# Patient Record
Sex: Male | Born: 1935 | Race: White | Hispanic: No | Marital: Married | Smoking: Never smoker
Health system: Southern US, Community
[De-identification: ages and names within clinical notes are randomized; demographics above are authoritative.]

## PROBLEM LIST (undated history)

## (undated) DIAGNOSIS — K2970 Gastritis, unspecified, without bleeding: Secondary | ICD-10-CM

## (undated) DIAGNOSIS — I471 Supraventricular tachycardia: Secondary | ICD-10-CM

## (undated) DIAGNOSIS — K2990 Gastroduodenitis, unspecified, without bleeding: Secondary | ICD-10-CM

## (undated) DIAGNOSIS — C259 Malignant neoplasm of pancreas, unspecified: Secondary | ICD-10-CM

## (undated) DIAGNOSIS — D126 Benign neoplasm of colon, unspecified: Secondary | ICD-10-CM

## (undated) DIAGNOSIS — B9681 Helicobacter pylori [H. pylori] as the cause of diseases classified elsewhere: Secondary | ICD-10-CM

## (undated) DIAGNOSIS — K2270 Barrett's esophagus without dysplasia: Secondary | ICD-10-CM

## (undated) DIAGNOSIS — C257 Malignant neoplasm of other parts of pancreas: Secondary | ICD-10-CM

## (undated) DIAGNOSIS — K219 Gastro-esophageal reflux disease without esophagitis: Secondary | ICD-10-CM

## (undated) DIAGNOSIS — I10 Essential (primary) hypertension: Secondary | ICD-10-CM

## (undated) HISTORY — DX: Helicobacter pylori (H. pylori) as the cause of diseases classified elsewhere: B96.81

## (undated) HISTORY — PX: NASAL SEPTUM SURGERY: SHX37

## (undated) HISTORY — PX: BLADDER SURGERY: SHX569

## (undated) HISTORY — DX: Gastroduodenitis, unspecified, without bleeding: K29.90

## (undated) HISTORY — PX: HEMORRHOID SURGERY: SHX153

## (undated) HISTORY — DX: Essential (primary) hypertension: I10

## (undated) HISTORY — PX: OTHER SURGICAL HISTORY: SHX169

## (undated) HISTORY — DX: Malignant neoplasm of other parts of pancreas: C25.7

## (undated) HISTORY — PX: NECK SURGERY: SHX720

## (undated) HISTORY — DX: Gastritis, unspecified, without bleeding: K29.70

## (undated) HISTORY — DX: Benign neoplasm of colon, unspecified: D12.6

## (undated) HISTORY — PX: TONSILLECTOMY: SUR1361

## (undated) HISTORY — DX: Barrett's esophagus without dysplasia: K22.70

---

## 1997-10-14 ENCOUNTER — Ambulatory Visit (HOSPITAL_COMMUNITY): Admission: RE | Admit: 1997-10-14 | Discharge: 1997-10-14 | Payer: Self-pay | Admitting: Neurological Surgery

## 1997-10-21 ENCOUNTER — Encounter: Payer: Self-pay | Admitting: Neurological Surgery

## 1997-10-21 ENCOUNTER — Ambulatory Visit (HOSPITAL_COMMUNITY): Admission: RE | Admit: 1997-10-21 | Discharge: 1997-10-21 | Payer: Self-pay | Admitting: Neurological Surgery

## 1998-01-21 DIAGNOSIS — B9681 Helicobacter pylori [H. pylori] as the cause of diseases classified elsewhere: Secondary | ICD-10-CM

## 1998-01-21 HISTORY — PX: SINUS EXPLORATION: SHX5214

## 1998-01-21 HISTORY — DX: Helicobacter pylori (H. pylori) as the cause of diseases classified elsewhere: B96.81

## 2000-01-18 ENCOUNTER — Encounter: Admission: RE | Admit: 2000-01-18 | Discharge: 2000-01-18 | Payer: Self-pay | Admitting: Family Medicine

## 2000-06-24 ENCOUNTER — Ambulatory Visit (HOSPITAL_COMMUNITY): Admission: RE | Admit: 2000-06-24 | Discharge: 2000-06-24 | Payer: Self-pay | Admitting: Internal Medicine

## 2001-05-21 ENCOUNTER — Ambulatory Visit (HOSPITAL_COMMUNITY): Admission: RE | Admit: 2001-05-21 | Discharge: 2001-05-21 | Payer: Self-pay | Admitting: Family Medicine

## 2001-05-21 ENCOUNTER — Encounter: Payer: Self-pay | Admitting: Family Medicine

## 2001-06-29 ENCOUNTER — Encounter: Payer: Self-pay | Admitting: Family Medicine

## 2001-06-29 ENCOUNTER — Ambulatory Visit (HOSPITAL_COMMUNITY): Admission: RE | Admit: 2001-06-29 | Discharge: 2001-06-29 | Payer: Self-pay | Admitting: Family Medicine

## 2002-10-06 ENCOUNTER — Ambulatory Visit (HOSPITAL_COMMUNITY): Admission: RE | Admit: 2002-10-06 | Discharge: 2002-10-06 | Payer: Self-pay | Admitting: Family Medicine

## 2002-10-06 ENCOUNTER — Encounter: Payer: Self-pay | Admitting: Family Medicine

## 2002-11-05 ENCOUNTER — Ambulatory Visit (HOSPITAL_COMMUNITY): Admission: RE | Admit: 2002-11-05 | Discharge: 2002-11-05 | Payer: Self-pay | Admitting: Internal Medicine

## 2004-01-31 ENCOUNTER — Ambulatory Visit (HOSPITAL_COMMUNITY): Admission: RE | Admit: 2004-01-31 | Discharge: 2004-01-31 | Payer: Self-pay | Admitting: Family Medicine

## 2004-05-16 ENCOUNTER — Ambulatory Visit (HOSPITAL_COMMUNITY): Admission: RE | Admit: 2004-05-16 | Discharge: 2004-05-16 | Payer: Self-pay | Admitting: Family Medicine

## 2004-12-18 ENCOUNTER — Ambulatory Visit: Payer: Self-pay | Admitting: Internal Medicine

## 2004-12-20 ENCOUNTER — Ambulatory Visit (HOSPITAL_COMMUNITY): Admission: RE | Admit: 2004-12-20 | Discharge: 2004-12-20 | Payer: Self-pay | Admitting: Internal Medicine

## 2006-01-21 HISTORY — PX: ESOPHAGOGASTRODUODENOSCOPY: SHX1529

## 2006-07-16 ENCOUNTER — Ambulatory Visit: Payer: Self-pay | Admitting: Gastroenterology

## 2006-07-24 ENCOUNTER — Ambulatory Visit: Payer: Self-pay | Admitting: Gastroenterology

## 2006-07-24 ENCOUNTER — Encounter: Payer: Self-pay | Admitting: Gastroenterology

## 2006-07-24 ENCOUNTER — Ambulatory Visit (HOSPITAL_COMMUNITY): Admission: RE | Admit: 2006-07-24 | Discharge: 2006-07-24 | Payer: Self-pay | Admitting: Gastroenterology

## 2006-08-14 ENCOUNTER — Ambulatory Visit: Payer: Self-pay | Admitting: Gastroenterology

## 2007-01-22 HISTORY — PX: DOPPLER ECHOCARDIOGRAPHY: SHX263

## 2007-01-22 HISTORY — PX: NM MYOVIEW LTD: HXRAD82

## 2007-03-09 ENCOUNTER — Ambulatory Visit (HOSPITAL_COMMUNITY): Admission: RE | Admit: 2007-03-09 | Discharge: 2007-03-09 | Payer: Self-pay | Admitting: Family Medicine

## 2008-01-22 DIAGNOSIS — D126 Benign neoplasm of colon, unspecified: Secondary | ICD-10-CM

## 2008-01-22 HISTORY — PX: COLONOSCOPY: SHX174

## 2008-01-22 HISTORY — DX: Benign neoplasm of colon, unspecified: D12.6

## 2008-05-16 ENCOUNTER — Ambulatory Visit (HOSPITAL_COMMUNITY): Admission: RE | Admit: 2008-05-16 | Discharge: 2008-05-16 | Payer: Self-pay | Admitting: Anesthesiology

## 2008-09-23 DIAGNOSIS — M109 Gout, unspecified: Secondary | ICD-10-CM

## 2008-09-23 DIAGNOSIS — K30 Functional dyspepsia: Secondary | ICD-10-CM

## 2008-09-23 DIAGNOSIS — J329 Chronic sinusitis, unspecified: Secondary | ICD-10-CM | POA: Insufficient documentation

## 2008-09-23 DIAGNOSIS — I10 Essential (primary) hypertension: Secondary | ICD-10-CM | POA: Insufficient documentation

## 2008-09-23 DIAGNOSIS — K2990 Gastroduodenitis, unspecified, without bleeding: Secondary | ICD-10-CM

## 2008-09-23 DIAGNOSIS — K2980 Duodenitis without bleeding: Secondary | ICD-10-CM | POA: Insufficient documentation

## 2008-09-23 DIAGNOSIS — M129 Arthropathy, unspecified: Secondary | ICD-10-CM | POA: Insufficient documentation

## 2008-09-23 DIAGNOSIS — K2970 Gastritis, unspecified, without bleeding: Secondary | ICD-10-CM | POA: Insufficient documentation

## 2008-09-23 DIAGNOSIS — K219 Gastro-esophageal reflux disease without esophagitis: Secondary | ICD-10-CM

## 2008-09-23 HISTORY — DX: Gastroduodenitis, unspecified, without bleeding: K29.90

## 2008-09-23 HISTORY — DX: Gastritis, unspecified, without bleeding: K29.70

## 2008-09-28 ENCOUNTER — Ambulatory Visit: Payer: Self-pay | Admitting: Gastroenterology

## 2008-09-28 DIAGNOSIS — R1013 Epigastric pain: Secondary | ICD-10-CM

## 2008-09-29 ENCOUNTER — Encounter: Payer: Self-pay | Admitting: Gastroenterology

## 2008-09-29 DIAGNOSIS — F1010 Alcohol abuse, uncomplicated: Secondary | ICD-10-CM | POA: Insufficient documentation

## 2008-10-03 ENCOUNTER — Ambulatory Visit (HOSPITAL_COMMUNITY): Admission: RE | Admit: 2008-10-03 | Discharge: 2008-10-03 | Payer: Self-pay | Admitting: Gastroenterology

## 2008-10-03 LAB — CONVERTED CEMR LAB
ALT: 26 units/L (ref 0–53)
AST: 25 units/L (ref 0–37)
Albumin: 4.3 g/dL (ref 3.5–5.2)
Alkaline Phosphatase: 60 units/L (ref 39–117)
Calcium: 9.2 mg/dL (ref 8.4–10.5)
Chloride: 105 meq/L (ref 96–112)
Potassium: 4.3 meq/L (ref 3.5–5.3)
Sodium: 142 meq/L (ref 135–145)
Total Protein: 7.2 g/dL (ref 6.0–8.3)

## 2008-10-05 ENCOUNTER — Encounter: Payer: Self-pay | Admitting: Gastroenterology

## 2008-10-05 ENCOUNTER — Ambulatory Visit: Payer: Self-pay | Admitting: Gastroenterology

## 2008-10-05 ENCOUNTER — Ambulatory Visit (HOSPITAL_COMMUNITY): Admission: RE | Admit: 2008-10-05 | Discharge: 2008-10-05 | Payer: Self-pay | Admitting: Gastroenterology

## 2008-10-10 ENCOUNTER — Telehealth (INDEPENDENT_AMBULATORY_CARE_PROVIDER_SITE_OTHER): Payer: Self-pay | Admitting: *Deleted

## 2008-12-14 ENCOUNTER — Ambulatory Visit: Payer: Self-pay | Admitting: Gastroenterology

## 2009-05-24 ENCOUNTER — Encounter (INDEPENDENT_AMBULATORY_CARE_PROVIDER_SITE_OTHER): Payer: Self-pay | Admitting: *Deleted

## 2009-11-01 ENCOUNTER — Ambulatory Visit (HOSPITAL_COMMUNITY)
Admission: RE | Admit: 2009-11-01 | Discharge: 2009-11-01 | Payer: Self-pay | Source: Home / Self Care | Admitting: Internal Medicine

## 2009-12-21 HISTORY — PX: ESOPHAGOGASTRODUODENOSCOPY: SHX1529

## 2009-12-27 ENCOUNTER — Encounter
Admission: RE | Admit: 2009-12-27 | Discharge: 2009-12-27 | Payer: Self-pay | Source: Home / Self Care | Admitting: Neurological Surgery

## 2010-01-03 ENCOUNTER — Ambulatory Visit: Payer: Self-pay | Admitting: Gastroenterology

## 2010-01-04 ENCOUNTER — Encounter: Payer: Self-pay | Admitting: Internal Medicine

## 2010-01-09 ENCOUNTER — Ambulatory Visit (HOSPITAL_COMMUNITY)
Admission: RE | Admit: 2010-01-09 | Discharge: 2010-01-09 | Payer: Self-pay | Source: Home / Self Care | Attending: Internal Medicine | Admitting: Internal Medicine

## 2010-01-12 ENCOUNTER — Ambulatory Visit (HOSPITAL_COMMUNITY)
Admission: RE | Admit: 2010-01-12 | Discharge: 2010-01-12 | Payer: Self-pay | Source: Home / Self Care | Attending: Internal Medicine | Admitting: Internal Medicine

## 2010-01-16 ENCOUNTER — Encounter: Payer: Self-pay | Admitting: Internal Medicine

## 2010-01-17 ENCOUNTER — Encounter (HOSPITAL_COMMUNITY)
Admission: RE | Admit: 2010-01-17 | Discharge: 2010-02-16 | Payer: Self-pay | Source: Home / Self Care | Attending: Internal Medicine | Admitting: Internal Medicine

## 2010-01-18 ENCOUNTER — Inpatient Hospital Stay (HOSPITAL_COMMUNITY)
Admission: RE | Admit: 2010-01-18 | Discharge: 2010-01-19 | Payer: Self-pay | Source: Home / Self Care | Attending: Neurological Surgery | Admitting: Neurological Surgery

## 2010-02-20 NOTE — Letter (Signed)
Summary: Recall Office Visit  [facility]  [address]   [place], [place] [address]   Phone: [phone]  Fax: [phone]      May 24, 2009   William Rivera [address] [place], [place]  [address] 1935-05-08   Dear [name],   According to our records, it is time for you to schedule a follow-up office visit with [place].   At your convenience, please call [phone] to schedule an office visit. If you have any questions, concerns, or feel that this letter is in error, we would appreciate your call.   Sincerely,    Diana Eves  [facility] Ph: [phone]   Fax: [phone]

## 2010-02-22 NOTE — Letter (Signed)
Summary: HIDA SCAN ORDER  HIDA SCAN ORDER   Imported By: Ave Filter 01/16/2010 12:06:34  _____________________________________________________________________  External Attachment:    Type:   Image     Comment:   External Document

## 2010-02-22 NOTE — Letter (Signed)
Summary: EGD ORDER  EGD ORDER   Imported By: Ave Filter 01/04/2010 10:00:41  _____________________________________________________________________  External Attachment:    Type:   Image     Comment:   External Document

## 2010-02-22 NOTE — Assessment & Plan Note (Signed)
Summary: ABD PAIN & NAUSEA/LAW   Visit Type:  Follow-up Visit Primary Care Provider:  Phillips Odor  CC:  abd pain/burning/nausea.  History of Present Illness: Pt presents today with c/o epigastric pain that wakens him up at 3-4am in the morning. X 3-4 mos worsened. Has abstained from alcohol for 4-5 months. Rare pain during day, mostly at night. "sick" feeling  at night with pain. Denies dysphagia or odynophagia. no loss of appetite. weight stable. occasional ASA, tries to limit NSAID use. no BC or Goodys. Father passed away from stomach ca at 73 years old, pt concerned. Hx of Aciphex in past, too expensive, took Prevacid. Prevacid seemed same as Zantac, so just took Zantac. On Zantac for past few years. Denies any melena, hematochezia. BM daily. No constipation. Zantac not relieving like it used to. Last saw Dr. Darrick Penna in 2010, recommendations to proceed with endoscopy if worsening pain.   Current Medications (verified): 1)  Allopurinol 100 Mg Tabs (Allopurinol) .... Take 1 Tablet By Mouth Once A Day 2)  Atenolol 25 Mg Tabs (Atenolol) .... Take 1 Tablet By Mouth Once A Day 3)  Zantac 150 Mg Tabs (Ranitidine Hcl) .... Take 1 Tablet By Mouth Once A Day  Allergies (verified): 1)  ! Lucy Antigua  Family History: Father: gastric cancer age 30 Mother: non-contributory No FH of Colon Cancer:  Social History: Retired: truck Hospital doctor Married X 48 years 1 children Patient is a former smoker. age 75.  Alcohol Use - no: stopped 4-5 months ago Smoking Status:  quit  Review of Systems General:  Denies fever, chills, and anorexia. Eyes:  Denies blurring, irritation, and discharge. ENT:  Denies sore throat, hoarseness, and difficulty swallowing. CV:  Denies chest pains and syncope. Resp:  Denies dyspnea at rest and wheezing. GI:  Complains of abdominal pain; denies difficulty swallowing, gas/bloating, constipation, change in bowel habits, bloody BM's, and black BMs. GU:  Denies urinary burning and urinary  frequency. MS:  Denies joint pain / LOM, joint swelling, and joint stiffness. Derm:  Denies rash, itching, and dry skin. Neuro:  Denies weakness and syncope. Psych:  Denies depression and anxiety. Endo:  Denies cold intolerance and heat intolerance. Heme:  Denies bruising and bleeding.  Vital Signs:  Patient profile:   75 year old male Height:      69 inches Weight:      223 pounds BMI:     33.05 Temp:     97.9 degrees F oral Pulse rate:   72 / minute BP sitting:   130 / 84  (left arm) Cuff size:   regular  Vitals Entered By: Cloria Spring LPN (January 03, 2010 2:11 PM)  Physical Exam  General:  Well developed, well nourished, no acute distress. Head:  Normocephalic and atraumatic. Eyes:  sclera non-icteric Lungs:  Clear throughout to auscultation. Heart:  Regular rate and rhythm; no murmurs, rubs,  or bruits. Abdomen:  normal bowel sounds, obese, without guarding, and without rebound.  +BS, mild epigastric tenderness Msk:  Symmetrical with no gross deformities. Normal posture. Pulses:  Normal pulses noted. Extremities:  No clubbing, cyanosis, edema or deformities noted. Neurologic:  Alert and  oriented x4;  grossly normal neurologically.  Impression & Recommendations:  Problem # 1:  ABDOMINAL PAIN, EPIGASTRIC (ICD-96.53)  75 year old Caucasian male with hx of duodenitis and gastritis secondary to NSAIDs. last EGD 2008 by Dr Darrick Penna: multiple antral erosions, multiple areas of exudate in duodenum with erosions. Hx of alcohol use. Has abstained X 4-5 mos. Numerous  PPIs in past including prilosec, aciphex, zantac, prevacid. Currently on Zantac, +epigastric pain worsening over 4-5 months, specifically at night. + nausea. with hx, likely gastritis vs PUD, father has hx of gastric ca, possibility of malignancy a concern.  Dexilant trial, #15 samples. Rx called into pharmacy EGD with Dr. Jena Gauss (in Dr. Evelina Dun absence): risks, benefits, alternatives discussed with pt. States  understanding and has given verbal consent.   Orders: Est. Patient Level II ([address]) Prescriptions: DEXILANT 60 MG CPDR (DEXLANSOPRAZOLE) take 1 by mouth daily  #30 x 3   Entered and Authorized by:   Gerrit Halls NP   Signed by:   Gerrit Halls NP on 01/03/2010   Method used:   Faxed to ...       [address] [phone]* (retail)       [address]       [place], [place]  [address]       Ph: [phone]       Fax: [phone]   RxID:   [id number]

## 2010-02-26 ENCOUNTER — Ambulatory Visit (INDEPENDENT_AMBULATORY_CARE_PROVIDER_SITE_OTHER): Payer: Medicare Other | Admitting: Urgent Care

## 2010-02-26 ENCOUNTER — Encounter: Payer: Self-pay | Admitting: Urgent Care

## 2010-02-26 DIAGNOSIS — K2270 Barrett's esophagus without dysplasia: Secondary | ICD-10-CM | POA: Insufficient documentation

## 2010-02-26 DIAGNOSIS — K219 Gastro-esophageal reflux disease without esophagitis: Secondary | ICD-10-CM

## 2010-03-05 ENCOUNTER — Other Ambulatory Visit (HOSPITAL_COMMUNITY): Payer: Self-pay | Admitting: Internal Medicine

## 2010-03-05 ENCOUNTER — Ambulatory Visit (HOSPITAL_COMMUNITY): Payer: PRIVATE HEALTH INSURANCE

## 2010-03-05 ENCOUNTER — Ambulatory Visit (HOSPITAL_COMMUNITY)
Admission: RE | Admit: 2010-03-05 | Discharge: 2010-03-05 | Disposition: A | Discharge status: Home / Self Care | Payer: Medicare Other | Source: Ambulatory Visit | Attending: Internal Medicine | Admitting: Internal Medicine

## 2010-03-05 ENCOUNTER — Encounter (HOSPITAL_COMMUNITY): Payer: Self-pay

## 2010-03-05 DIAGNOSIS — M545 Low back pain, unspecified: Secondary | ICD-10-CM | POA: Insufficient documentation

## 2010-03-05 DIAGNOSIS — M549 Dorsalgia, unspecified: Secondary | ICD-10-CM

## 2010-03-05 DIAGNOSIS — M47817 Spondylosis without myelopathy or radiculopathy, lumbosacral region: Secondary | ICD-10-CM | POA: Insufficient documentation

## 2010-03-08 NOTE — Assessment & Plan Note (Signed)
Summary: OV W/US 4-6 WEEKS PER RMR,SLUDGE IN GB   Vital Signs:  Patient profile:   75 year old male Height:      69 inches Weight:      224 pounds BMI:     33.20 Temp:     97.8 degrees F oral Pulse rate:   76 / minute BP sitting:   115 / 78  (left arm) Cuff size:   regular  Vitals Entered By: Hendricks Limes LPN (February 26, 2010 3:00 PM)  Visit Type:  Follow-up Visit Primary Care Provider:  Dr. Phillips Odor  Chief Complaint:  follow up.  History of Present Illness: 75 y/o caucasian  male here for FU GERD & abd pain.  Denies abd pain, N/V/D.  Denies heartburn & indigestion.  Denies dysphagia or odynophagia.  Wt stable.  Denies anorexia.  c/o low back pain from fall and will be seeing PCP today for this.  Denies GU symptoms.  Recent ABD US-> GB sludge, recent HIDA normal.  Has been taking Zantac due to the price of Dexilant.   Current Medications (verified): 1)  Allopurinol 100 Mg Tabs (Allopurinol) .... Take 1 Tablet By Mouth Once A Day 2)  Atenolol 25 Mg Tabs (Atenolol) .... Take 1 Tablet By Mouth Once A Day 3)  Zantac 150 Mg Tabs (Ranitidine Hcl) .... Take 1 Tablet By Mouth Once A Day 4)  Ramipril 2.5 Mg Caps (Ramipril) .... 3 Times A Week  Allergies (verified): 1)  ! Lucy Antigua  Past History:  Past Surgical History: Last updated: 09/23/2008 Neck surgery (disc replaced) Bladder 1978, 1979 Sinus (2000) Tonsillectomy ( as a child)  Past Medical History: 01/09/10 EGD->short segment Barrett's, Small hiatal hernia, chronic gastritis 2008 EGD: Duodenitis and gastritis 2o to NSAIDs/EtOH CRI, Cr 1.5, followed by Dr. Fausto Skillern Hypertension GOUT 2010 TCS: Simple adenomas  Review of Systems General:  Denies fever, chills, sweats, anorexia, fatigue, weakness, malaise, weight loss, and sleep disorder. CV:  Denies chest pains, angina, palpitations, syncope, dyspnea on exertion, orthopnea, PND, peripheral edema, and claudication. Resp:  Denies dyspnea at rest, dyspnea with exercise,  cough, sputum, wheezing, coughing up blood, and pleurisy. GI:  See HPI; Denies difficulty swallowing, pain on swallowing, jaundice, bloody BM's, black BMs, and fecal incontinence. GU:  Denies urinary burning, blood in urine, urinary frequency, urinary hesitancy, nocturnal urination, and urinary incontinence. MS:  Denies joint pain / LOM, joint swelling, joint stiffness, joint deformity, low back pain, muscle weakness, muscle cramps, muscle atrophy, leg pain at night, leg pain with exertion, and shoulder pain / LOM hand / wrist pain (CTS). Derm:  Denies rash, itching, dry skin, hives, moles, warts, and unhealing ulcers. Psych:  Denies depression, anxiety, memory loss, suicidal ideation, hallucinations, paranoia, phobia, and confusion. Heme:  Denies bruising, bleeding, and enlarged lymph nodes.  Physical Exam  General:  Well developed, well nourished, no acute distress. Head:  Normocephalic and atraumatic. Eyes:  sclera non-icteric Mouth:  No deformity or lesions. Neck:  Supple; no masses. Heart:  Regular rate and rhythm; no murmurs, rubs,  or bruits. Abdomen:  Soft, nontender and nondistended. No masses, hepatosplenomegaly or hernias noted. Normal bowel sounds.without guarding and without rebound.   Msk:  Symmetrical with no gross deformities. Normal posture. Pulses:  Normal pulses noted. Extremities:  No clubbing, cyanosis, edema or deformities noted. Neurologic:  Alert and  oriented x4;  grossly normal neurologically. Skin:  Intact without significant lesions or rashes. Cervical Nodes:  No significant cervical adenopathy. Psych:  Alert and cooperative.  Normal mood and affect.   Impression & Recommendations:  Problem # 1:  GERD (ICD-530.81) Well controlled on Zantac, however urged him to resume once daily PPI.  Dexilant rebate card should reduce his copay.  If not, will consider another PPI given hx Barrett's.  Orders: Est. Patient Level III ([address])  Problem # 2:  BARRETTS ESOPHAGUS  (ICD-530.85) See #1  Patient Instructions: 1)  Colonscopy 09/2018 2)  Office visit to set up EGD in 12/2010 surveillance Barrett's 3)  Dexilant 60mg  daily 4)  Stop zantac   Orders Added: 1)  Est. Patient Level III [[address]]  Appended Document: OV W/US 4-6 WEEKS PER RMR,SLUDGE IN GB reminders in epic

## 2010-03-29 ENCOUNTER — Encounter (INDEPENDENT_AMBULATORY_CARE_PROVIDER_SITE_OTHER): Payer: Self-pay | Admitting: *Deleted

## 2010-04-02 LAB — CBC
MCH: 34.1 pg — ABNORMAL HIGH (ref 26.0–34.0)
MCHC: 35.6 g/dL (ref 30.0–36.0)
MCV: 95.9 fL (ref 78.0–100.0)
Platelets: 188 10*3/uL (ref 150–400)
RBC: 4.37 MIL/uL (ref 4.22–5.81)
RDW: 12.6 % (ref 11.5–15.5)

## 2010-04-02 LAB — BASIC METABOLIC PANEL
BUN: 22 mg/dL (ref 6–23)
CO2: 28 mEq/L (ref 19–32)
Calcium: 9.4 mg/dL (ref 8.4–10.5)
Chloride: 106 mEq/L (ref 96–112)
Creatinine, Ser: 1.43 mg/dL (ref 0.4–1.5)
Glucose, Bld: 109 mg/dL — ABNORMAL HIGH (ref 70–99)

## 2010-04-03 NOTE — Letter (Signed)
Summary: Recall Office Visit  [facility]  [address]   [place], [place] [address]   Phone: [phone]  Fax: [phone]      March 29, 2010   William Rivera [address] [place], [place]  [address] 1935-12-11   Dear William Rivera,   According to our records, it is time for you to schedule a follow-up office visit with [place].   At your convenience, please call [phone] to schedule an office visit. If you have any questions, concerns, or feel that this letter is in error, we would appreciate your call.   Sincerely,    Diana Eves  [facility] Ph: [phone]   Fax: [phone]

## 2010-04-05 ENCOUNTER — Encounter: Payer: Self-pay | Admitting: Gastroenterology

## 2010-04-16 ENCOUNTER — Encounter: Payer: Self-pay | Admitting: Gastroenterology

## 2010-04-16 ENCOUNTER — Ambulatory Visit (INDEPENDENT_AMBULATORY_CARE_PROVIDER_SITE_OTHER): Payer: Medicare Other | Admitting: Gastroenterology

## 2010-04-16 VITALS — BP 142/90 | HR 76 | Temp 97.6°F | Ht 69.0 in | Wt 215.0 lb

## 2010-04-16 DIAGNOSIS — K2270 Barrett's esophagus without dysplasia: Secondary | ICD-10-CM

## 2010-04-16 DIAGNOSIS — R634 Abnormal weight loss: Secondary | ICD-10-CM

## 2010-04-16 DIAGNOSIS — K8020 Calculus of gallbladder without cholecystitis without obstruction: Secondary | ICD-10-CM

## 2010-04-16 DIAGNOSIS — R1013 Epigastric pain: Secondary | ICD-10-CM

## 2010-04-16 DIAGNOSIS — K828 Other specified diseases of gallbladder: Secondary | ICD-10-CM

## 2010-04-16 MED ORDER — ESOMEPRAZOLE MAGNESIUM 40 MG PO CPDR: 40.0000 mg | DELAYED_RELEASE_CAPSULE | Freq: Every day | ORAL | Status: DC

## 2010-04-16 NOTE — Assessment & Plan Note (Signed)
Diagnosed at time of the EGD in December of 2011. He is due for surveillance EGD in December of 2012. He should stay on PPI. Usually he takes Prevacid 24 hour as other prescription PPIs are too expensive. We'll give him samples of Nexium 40 mg daily and he can take Prevacid 24-hour when his samples run out.

## 2010-04-16 NOTE — Assessment & Plan Note (Signed)
Much improved. Would advise continued alcohol cessation. Get back on PPI. Nexium samples provided to supplement his supply. He generally takes Prevacid 24 hour due to the expense of other prescription PPIs. He will call with any persistent symptoms. Otherwise we'll see him back in 3 months, with a scheduled visit with Dr. Jonette Eva.

## 2010-04-16 NOTE — Patient Instructions (Signed)
Please take either Nexium or Prevacid OTC once daily instead of Zantac. If stomach burning/pain persists, please call us. If you continue to loose weight (ie 5 more pounds), please call us. Office visit with Dr. Jonette Eva in three months.

## 2010-04-16 NOTE — Progress Notes (Signed)
William Rivera is here for followup visit. He was last seen in 02/2010. Overall doing well. No dysphagia. Some occasional heartburn. Epigastric burning does better on Prevacid but recently ran out. No melena, brbpr. No n/v. Appetite good. Weight down 10 pounds since 2/12, unintentional. Has had some back pain, epidural injections 3 weeks ago. Some right flank pain this weekend. No dysuria, hematuria.  He states that he has quit drinking all alcohol about a month ago to see if it would help with his abdominal pain and burning. He believes it has helped.  Current Outpatient Prescriptions  Medication Sig Dispense Refill  . allopurinol (ZYLOPRIM) 100 MG tablet Take 100 mg by mouth daily.        Marland Kitchen atenolol (TENORMIN) 25 MG tablet Take 25 mg by mouth daily.        . ramipril (ALTACE) 2.5 MG tablet Take 2.5 mg by mouth daily. 3 TIMES A WEEK       . ranitidine (ZANTAC) 150 MG tablet Take 150 mg by mouth 1 dose over 46 hours.        Marland Kitchen esomeprazole (NEXIUM) 40 MG capsule Take 1 capsule (40 mg total) by mouth daily.  15 capsule  0    No Known Allergies  ROS:   General: Negative for anorexia, fever, chills, fatigue, weakness. Positive for weight loss.  ENT: Negative for hoarseness, difficulty swallowing , nasal congestion.  CV: Negative for chest pain, angina, palpitations, dyspnea on exertion, peripheral edema.   Respiratory: Negative for dyspnea at rest, dyspnea on exertion, cough, sputum, wheezing.   GI: See history of present illness.  GU:  Negative for dysuria, hematuria, urinary incontinence, urinary frequency, nocturnal urination.   Endo: Positive for unusual weight change.   Physical Examination:   General: Well-nourished, well-developed in no acute distress.   Eyes: No icterus.  Mouth: Oropharyngeal mucosa moist and pink , no lesions erythema or exudate.  Lungs: Clear to auscultation bilaterally.   Heart: Regular rate and rhythm, no murmurs rubs or gallops.   Abdomen: Mild epigastric tenderness.  Bowel sounds are normal, nondistended, no hepatosplenomegaly or masses, no abdominal bruits or hernia , no rebound or guarding.    Extremities: No lower extremity edema.   Neuro: Alert and oriented x 4    Skin: Warm and dry, no jaundice.    Psych: Alert and cooperative, normal mood and affect.

## 2010-04-16 NOTE — Progress Notes (Signed)
Reviewed by R. Michael Rourk, MD FACP FACG 

## 2010-04-16 NOTE — Assessment & Plan Note (Signed)
9 pound weight loss since his last office visit here in February. This is unintentional. He states his appetite is good. Will have him monitor his weight at home and if it drops more than 5 more pounds he should call us and at that point would consider further workup. He is in agreement with this plan.

## 2010-04-16 NOTE — Assessment & Plan Note (Signed)
Gallbladder sludge seen on recent abdominal ultrasound. HIDA scan unremarkable. Overall patient is fairly asymptomatic. Suspect is epigastric pain may be more related to gastritis and reflux. His symptoms have settled down since he has cut out alcohol consumption. Will continue to monitor for now.

## 2010-06-05 NOTE — Assessment & Plan Note (Signed)
NAME:  William Rivera, William Rivera                   CHART#:  [id number]   DATE:  08/14/2006                       DOB:  1935-10-03   REFERRING PHYSICIAN:  Patrica Duel.   PROBLEM LIST:  1. Duodenitis and gastritis secondary to non-steroidal anti-      inflammatory drugs.  2. Gout.  3. Hypertension.   SUBJECTIVE:  The patient is a 75 year old male who presents as a return  patient visit.  His nausea is gone and he has very little abdominal  pain.  He denies any vomiting or diarrhea.  He does have chronic  posterior nasal drip and was wondering if this can not contribute to  nausea.  He has tried Claritin-D and that is the only thing that works,  but it causes his heart rate to increase.  He denies any aspirin or  Advil use.   MEDICATIONS:  1. Allopurinol 300 mg daily.  2. Atenolol 25 mg b.i.d.  3. Aciphex 20 mg daily.  4. Tylenol as needed.   OBJECTIVE:  Weight 227 pounds (down 3 pounds since June 2008), height 5  foot 9 inches, temperature 97.4, blood pressure 140/88, pulse 72.  GENERAL:  He is in no apparent distress, alert and oriented x4.  LUNGS:  Clear to auscultation bilaterally.  CARDIOVASCULAR:  Regular rhythm, no murmur, normal S1 and S2.  ABDOMEN:  Bowel sounds present, soft, nontender, nondistended.  No  rebound or guarding.   ASSESSMENT:  The patient is a 75 year old male with non-steroidal anti-  inflammatory drug induced gastritis and duodenitis and his symptoms are  controlled.  He states the Aciphex is very expensive and he would like  to change to something his insurance company would cover.  Thank you for  allowing me to see the patient in consultation.  My recommendations  follow.   RECOMMENDATIONS:  1. He will be changed to Prevacid 30 mg daily.  2. He has a return patient visit in 3 months.  3. He should have screening colonoscopy in 2-3 years.       Kassie Mends, M.D.  Electronically Signed     SM/MEDQ  D:  08/14/2006  T:  08/15/2006  Job:  [id number]   cc:    Patrica Duel, M.D.

## 2010-06-05 NOTE — Op Note (Signed)
NAMEJAMARKUS, William Rivera                  ACCOUNT NO.:  [id number]   MEDICAL RECORD NO.:  [id number]          PATIENT TYPE:  AMB   LOCATION:  DAY                           FACILITY:  [facility]   PHYSICIAN:  Kassie Mends, M.D.      DATE OF BIRTH:  Jan 01, 1936   DATE OF PROCEDURE:  07/24/2006  DATE OF DISCHARGE:                               OPERATIVE REPORT   PROCEDURE:  Esophagogastroduodenoscopy with biopsies of the duodenum and  the antrum.   INDICATION FOR EXAM:  William Rivera is a 75 year old male who has a  significant family history of gastric cancer.  He presented to the  clinic for persistent nausea.  His last upper endoscopy showed erosive  gastritis.  He continues to wake up every morning feeling nauseated.   FINDINGS:  1. Multiple antral erosions.  Biopsies obtained to evaluate for H.      pylori gastritis.  2. Multiple areas of exudate in the duodenum with erosions.  The      involved areas begin at the junction of the D1 and D2 and extends      beyond the length of the scope.  Biopsies were obtained via cold      forceps.  No ulcers or exudate seen in the duodenal bulb.   RECOMMENDATIONS:  1. Will obtain a fasting gastrin level today.  William Rivera likely has      erosive gastritis and duodenitis secondary to NSAID use.  He uses      aspirin, Advil, as well as Goody Powders.  The antritis and      duodenitis are the most likely etiology for his persistent nausea.  2. Avoid aspirin and NSAIDs for 30 days.  No anticoagulation for seven      days.  3. He may resume his previous diet but should avoid gastric irritants.      He will be given a handout on gastric irritants.  4. I will call William Rivera with his biopsy report.  5. Follow up appointment with Dr. Cira Servant in four weeks.  6. Stop Zantac, begin Aciphex once daily.   MEDICATIONS:  1. Demerol 75 mg IV.  2. Versed 6 mg IV.   PROCEDURE TECHNIQUE:  Physical exam was performed and informed consent  was obtained from the patient after  explaining the benefits, risks and  alternatives to the procedure.  The patient was connected to the monitor  and placed in the left lateral position.  Continuous oxygen was provided  by nasal cannula and IV medicines administered through an indwelling  cannula.  After administration of sedation, the patient's esophagus  intubated and the scope  was advanced under direct visualization to the second portion of the  duodenum.  The scope was removed slowly by carefully examining the  color, texture, anatomy and integrity of the mucosa on the way out.  The  patient was recovered in endoscopy and discharged home in satisfactory  condition.      Kassie Mends, M.D.  Electronically Signed     SM/MEDQ  D:  07/24/2006  T:  07/24/2006  Job:  [id number]   cc:   Patrica Duel, M.D.  Fax: [phone]

## 2010-06-05 NOTE — Consult Note (Signed)
William Rivera, William Rivera                  ACCOUNT NO.:  [id number]   MEDICAL RECORD NO.:  [id number]          PATIENT TYPE:  AMB   LOCATION:  DAY                           FACILITY:  [facility]   PHYSICIAN:  Kassie Rivera, M.D.      DATE OF BIRTH:  1935/12/11   DATE OF CONSULTATION:  07/16/2006  DATE OF DISCHARGE:                                 CONSULTATION   REASON FOR VISIT:  Nausea.   HISTORY OF PRESENT ILLNESS:  William Rivera is a 75 year old male who was seen  and evaluated by Dr. Karilyn Cota in the past.  He is a new patient visit to  me.  His last visit in the clinica was January 2007.  He has history of  chronic sinus syndrome as well as gastroesophageal reflux disease.  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  His last EGD was in 2004 which  showed erosive gastritis.  He has a significant family history of  gastric cancer.  He continues to complain of waking up every morning  feeling sick with nausea.  Over the last 6 months it has gotten worse.  He denies any vomiting or problems swallowing.  Sometimes he has pains  on both sides of his abdomen.  He states he feels like his eyes look  yellow.  He has never had his liver enzymes checked.  He complains of  heartburn once a week.  He denies any constipation or diarrhea or blood  in his stool.  His last colonoscopy was 7 to 8 years ago.  He had a  hyperplastic polyp.  His left abdominal ultrasound was in 2006 which  showed fatty infiltration of the liver and atrophic kidneys bilaterally.  His last liver panel in January 2007 showed a total bilirubin of 0.4,  AST 30, ALT of 41 and alk phos 61.  He denies any black stool, weight  loss and his appetite is good.  He has actually gained 4 pounds since  2006.   PAST MEDICAL HISTORY:  1. Hypertension.  2. Gout.  3. Chronic nausea.   PAST SURGICAL HISTORY:  He had reflux into his ureters and had his  ureters reimplanted.   ALLERGIES:  NO KNOWN DRUG ALLERGIES.   MEDICATIONS:  1. Aspirin occasionally.  2. Atenolol 25 mg twice daily.  3. Zantac as needed.  4. Advil as needed.  5. Ramipril 5 mg two times a week.  6. Allopurinol once a day.   FAMILY HISTORY:  He has a family history of stomach cancer, heart  disease and diabetes.   SOCIAL HISTORY:  He is married and retired.  He has not smoked any in 30  years.  She drinks bourbon on the weekends.   REVIEW OF SYSTEMS:  As per the HPI, otherwise all systems are negative.   PHYSICAL EXAM:  VITAL SIGNS:  Weight 230 pounds, height 5 feet 9 inches,  BMI 34 (obese), temperature 97.8, blood pressure 124/90, pulse 72.  GENERAL:  In general he is in no apparent  distress, alert and oriented  x4.  HEENT:  Exam is atraumatic, normocephalic.  Pupils equal, react to  light.  Mouth:  No oral lesions.  Posterior pharynx without erythema or  exudate.  NECK:  His neck has full range of motion.  No  lymphadenopathy.LUNGS:  Clear to auscultation bilaterally.  BACK:  Shows regular rhythm, no murmur, normal S1-S2.  ABDOMEN:  Bowel  sounds are present, soft, nontender, nondistended.  No rebound or  guarding, obese.  No abdominal bruits, unable to appreciate any  hepatosplenomegaly or pulsatile masses.  EXTREMITIES:  Without cyanosis,  clubbing or edema.  NEUROLOGIC:  He has no focal neurologic deficits.   RADIOGRAPHIC STUDIES:  Abdominal ultrasound from May 20, 2006: liver  that demonstrates increased echogenicity consistent with fatty  infiltration of the liver.  His pancreas and aorta were not visualized  due to overlying bowel gas.   ASSESSMENT:  William Rivera is a 75 year old male with chronic nausea.  The  differential diagnosis includes most likely gastroesophageal reflux  disease which is not well controlled with Zantac.  The differential  diagnosis includes occult GI malignancy, gastroparesis, and a low  likelihood of gallbladder disease.   Thank you for allowing me  to see William Rivera in consultation.  My  recommendations follow.   RECOMMENDATIONS:  1. William Rivera will be scheduled for an EGD next week to rule out erosive      esophagitis, or gastric mass as an etiology for his chronic nausea.  2. If his EGD does not reveal an etiology for his nausea, then a      gastric emptying study will be performed.  3. If his gastric emptying study does not reveal an etiology for his      nausea, then I will perform a HIDA scan.  4. If his HIDA scan is negative then we will change from Zantac to a      proton pump inhibitor.  5. He has a follow-up appointment to see me in 4 to 6 weeks.      Kassie Rivera, M.D.  Electronically Signed     SM/MEDQ  D:  07/16/2006  T:  07/17/2006  Job:  [id number]   cc:   William Rivera, M.D.  Fax: [phone]

## 2010-06-08 NOTE — Op Note (Signed)
NAME:  William Rivera, William Rivera                            ACCOUNT NO.:  [id number]   MEDICAL RECORD NO.:  [id number]                   PATIENT TYPE:  AMB   LOCATION:  DAY                                  FACILITY:  [facility]   PHYSICIAN:  Lionel December, M.D.                 DATE OF BIRTH:  [birth year]   DATE OF PROCEDURE:  11/05/2002  DATE OF DISCHARGE:                                 OPERATIVE REPORT   PROCEDURE:  Esophagogastroduodenoscopy.   INDICATIONS FOR PROCEDURE:  William Rivera is a 75 year old Caucasian male who has  symptoms of GERD as well as chronic nausea.  His heartburn is well-  controlled with therapy, but his nausea is not.  Ultrasound showed a fatty  liver.  LFTs have been requested this afternoon, but they are still pending.  The patient is very concerned about his symptoms because his father died of  gastric carcinoma in his 5s.  The patient's last EGD was over two years  ago.  The procedure risks were reviewed with the patient, and informed  consent for the procedure was obtained.   PREOPERATIVE MEDICATIONS:  Cetacaine spray for pharyngeal topical  anesthesia, Demerol 50 mg IV, Versed 4 mg IV in divided doses.   FINDINGS:  The procedure was performed in the endoscopy suite.  The  patient's vital signs and O2 saturations were monitored during the procedure  and remained stable.  The patient was placed in the left lateral recumbent  position, and the Olympus videoscope was passed via the oropharynx without  any difficulty into the esophagus.   Esophagus:  The mucosa of the esophagus was normal.  The squamocolumnar  junction was wavy, and there was a 3-cm size sliding hiatal hernia.  There  was a single, very inconspicuous varix in the distal esophagus, the  significance which is not clear, possibly not indicative of portal  hypertension.   Stomach:  It was empty and distended very well with insufflation.  The folds  of the proximal stomach were normal.  Examination of  the mucosa  revealed a  few erosions and antral erythema.  Some of these lesions were suspicious for  angiodysplasia.  There was no bleeding, and these were left alone.  The  pyloric channel was patent.  The angularis, fundus, and cardia were examined  by retroflexing the scope and were normal.   Duodenum:  Examination of the bulb revealed normal mucosa.  The scope was  passed to the second part of the duodenum where the mucosa and folds were  normal.  The endoscope was withdrawn.  No biopsy was taken.  The patient  tolerated the procedure well.   FINAL DIAGNOSES:  1. Small sliding hiatal hernia.  No evidence of erosive or ulcerative     esophagitis.  2. 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  3. Tiny esophageal  varix, possibly of no clinical significance.    RECOMMENDATIONS:  LFTs have been drawn.  Will also check H pylori.  I will  ask him to stop his Tagamet, and we will put him on Prevacid 30 mg p.o.  q.a.m.  We will also start him on doxepin 25 mg p.o. q.h.s. both to help him  with his insomnia, and I feel his nausea maybe indicative of a stress  disorder.  Will plan to see him in the office in a few weeks from now.      ___________________________________________                                            Lionel December, M.D.   NR/MEDQ  D:  11/05/2002  T:  11/05/2002  Job:  [id number]   cc:   Patrica Duel, M.D.  [address], [address]  [place]  [place] [address]  Fax: [phone]

## 2010-06-08 NOTE — H&P (Signed)
   NAME:  William Rivera, William Rivera                            ACCOUNT NO.:  [id number]   MEDICAL RECORD NO.:  [id number]                   PATIENT TYPE:  OUT   LOCATION:  RAD                                  FACILITY:  [facility]   PHYSICIAN:  Lionel December, M.D.                 DATE OF BIRTH:  [birth year]   DATE OF ADMISSION:  10/06/2002  DATE OF DISCHARGE:  10/06/2002                                HISTORY & PHYSICAL   CONTINUATION:   REVIEW OF SYMPTOMS:  Please see history of present illness for GI.  GENERAL:  He denies any weight loss.  CARDIOPULMONARY:  He denies any chest pain or  shortness of breath.   PHYSICAL EXAMINATION:  VITAL SIGNS:  Weight 216, blood pressure 130/98,  pulse 84.  GENERAL: Pleasant, well developed, well nourished, elderly Caucasian  gentleman in no acute distress.  SKIN:  Warm and dry with no jaundice.  HEENT:  Conjunctivae are pink. Sclerae are non-icteric.  Oropharyngeal  mucosa is moist and pink.  No lesions, erythema. Or exudate.  No  lymphadenopathy or thyromegaly.  CHEST:  Lungs are clear to auscultation.  CARDIAC EXAM:  Regular rate and rhythm with a normal S1 and S2.  No murmurs,  rubs or gallops.  ABDOMEN:  Positive bowel sounds, soft and non-distended.  He has mild  epigastric tenderness to deep palpation. No organomegaly or masses.  EXTREMITIES:  No edema.   An abdominal ultrasound revealed fatty infiltration of the liver, a 2.8 cm  simple appearing cyst associated with the right kidney and an atrophic left  kidney.   IMPRESSION:  The patient is a pleasant 75 year old gentleman who has a six  month history of daily early morning nausea.  He has some typical reflux  symptoms as well.  At this time we recommend upper endoscopy for further  evaluation of his symptoms.  I suspect this is related to gastroesophageal  reflux disease.   PLAN:  1. Esophagogastroduodenoscopy in the near future.  2. He will continue Tagamet for now per patient's request.  I feel     ultimately he will need to be on a protein pump inhibitor, however.  3. Given the fatty infiltration seen on the recent abdominal ultrasound, we     will obtain a set of liver function tests at the time of endoscopy.     _____________________________________  ___________________________________________  Tana Coast, P.A.                      Lionel December, M.D.   LL/MEDQ  D:  10/26/2002  T:  10/26/2002  Job:  [id number]

## 2010-07-11 ENCOUNTER — Ambulatory Visit: Payer: Medicare Other | Admitting: Gastroenterology

## 2010-07-18 ENCOUNTER — Ambulatory Visit: Payer: Medicare Other | Admitting: Gastroenterology

## 2010-08-27 ENCOUNTER — Ambulatory Visit (INDEPENDENT_AMBULATORY_CARE_PROVIDER_SITE_OTHER): Payer: Medicare Other | Admitting: Urgent Care

## 2010-08-27 ENCOUNTER — Encounter: Payer: Self-pay | Admitting: Urgent Care

## 2010-08-27 VITALS — BP 125/71 | HR 74 | Temp 97.8°F | Ht 69.0 in | Wt 217.0 lb

## 2010-08-27 DIAGNOSIS — R1013 Epigastric pain: Secondary | ICD-10-CM

## 2010-08-27 MED ORDER — DEXLANSOPRAZOLE 60 MG PO CPDR: 60.0000 mg | DELAYED_RELEASE_CAPSULE | Freq: Every day | ORAL | Status: DC

## 2010-08-27 NOTE — Patient Instructions (Signed)
You need to take dexilant 60mg  daily Stop prevacid  Barrett's Esophagus The esophagus is the muscular tube that carries food and saliva from the mouth to the stomach. Barrett's esophagus involves changes in the esophagus. Some of its lining is replaced by a type of tissue similar to that found in the intestine. This process is called intestinal metaplasia. While Barrett's esophagus may cause no symptoms itself, a small number of people with this condition develop a relatively rare but often deadly type of cancer of the esophagus. It is called esophageal adenocarcinoma. Barrett's esophagus is associated with the common condition called GERD (gastroesophageal reflux disease). HOW THE ESOPHAGUS WORKS The esophagus carries food, liquids, and saliva from the mouth to the stomach. The stomach acts as a container to start digestion and pump food and liquids into the intestines in a controlled process. Food can then be properly digested over time. Nutrients can be taken in (absorbed) by the intestines. The esophagus moves food to the stomach by coordinated contractions of its muscular lining. This process is automatic. People are usually not aware of it. Many people have felt their esophagus when they:  Swallow something too large.   Try to eat too quickly.   Drink very hot or cold liquids.  They then feel the movement of the food or drink down the esophagus into the stomach. This may be an uncomfortable feeling. DIGESTIVE TRACT The muscular layers of the esophagus are normally pinched together at both the upper and lower ends by muscles. These muscles are called sphincters. When a person swallows, the sphincters relax automatically. This allows food or drink to pass from the mouth, into the stomach. The muscles then close rapidly. This prevents the swallowed food or drink from leaking out of the stomach, back into the esophagus or into the mouth. These muscles make it possible to swallow while lying down or  even upside-down. When people belch to release swallowed air or gas from carbonated beverages, the sphincters relax. Then small amounts of food or drink may come back up, briefly. This condition is called reflux. The esophagus quickly squeezes the material back into the stomach. This is considered normal. These functions of the esophagus are an important part of everyday life. However, people who must have their esophagus removed, for example because of cancer, can live a relatively healthy life without it. GERD (GASTROESOPHAGEAL REFLUX DISEASE) Having some stomach contents (liquids or gas) sometimes reflux (come back up from the stomach into the esophagus) is considered normal. When it happens often, and causes other symptoms, it is considered a medical problem or disease. However, it is not necessarily a serious one or one that requires seeing a caregiver. The stomach produces acid and enzymes to digest food. When this mixture refluxes into the esophagus more often than normal or for a longer period of time than normal, it may produce symptoms. These symptoms are often called acid reflux. They are often described by people as heartburn, indigestion, or "gas". The symptoms often consist of a burning sensation below and behind the lower part of the breastbone or sternum. Almost everyone has experienced these symptoms at least once. This is typically a result of overeating. Other things that provoke GERD symptoms include:  Being overweight.   Eating certain types of foods.   Being pregnant.  In most people, GERD symptoms may last only a short time and require no treatment at all.  More continual symptoms are often quickly relieved by over-the-counter acid-reducing agents, such as antacids.  Other drugs used to relieve GERD symptoms are antisecretory drugs, such as histamine2 (H2) blockers or proton pump inhibitors. People who have symptoms often should talk with their caregiver. Other diseases can have  similar symptoms. Prescription medicines, together with other actions, might be needed to reduce reflux. GERD that is untreated over a long period can lead to problems. An example is an ulcer in the esophagus, that could cause bleeding. Another common problem is scar tissue that blocks the movement of swallowed food and drink through the esophagus. This condition is called stricture. Esophageal reflux may also cause certain less common symptoms. These include hoarseness or chronic cough. It sometimes provokes conditions such as asthma. While most patients find that lifestyle changes and acid-blocking drugs relieve their symptoms, caregivers sometimes advise surgery. Overall, GERD is one of the most common medical conditions. About 20 percent of the population can be affected over a lifetime. GERD AND BARRETT'S ESOPHAGUS The exact causes of Barrett's esophagus are not known. It is thought to be caused in part by the same factors that cause GERD. People who do not have heartburn can have Barrett's esophagus. However, it is found about 3 to 5 times more often in people with this condition. Barrett's esophagus is uncommon in children. The average age at diagnosis is 7. But it is usually difficult to know when the problem started. It is about twice as common in men as in women. It is much more common in white men than in men of other races. BARRETT'S ESOPHAGUS AND CANCER OF THE ESOPHAGUS Barrett's esophagus does not cause symptoms itself. However, it seems to precede the development of a particular kind of cancer. This cancer is esophageal adenocarcinoma. The risk of developing this cancer is 30 to 125 times higher in people who have Barrett's esophagus than in people who do not. This type of cancer is increasing quickly in white men. The increase is possibly related to the rise in obesity and GERD. For people who have Barrett's esophagus, the risk of getting cancer of the esophagus is small. It is less than 1  percent (0.4 percent to 0.5 percent) per year. Esophageal adenocarcinoma is often not curable. This is partly because the disease is often discovered at a late stage and treatments are not effective. DIAGNOSIS (HOW TO TELL WHAT IS WRONG) AND SCREENING Diagnosing Barrett's esophagus is not easy. At the present time it cannot be diagnosed based on symptoms, physical exam, or blood tests. The only useful test is upper gastrointestinal endoscopy and biopsy. In this procedure, a flexible tube called an endoscope is used. This tool is like a pencil sized flexible telescope. It has a light and tiny camera. It is passed into the esophagus. If the tissue appears suspicious to your caregiver, biopsies must be done. A biopsy is the removal of a small piece of tissue. This is done using a pincher-like device passed through the endoscope. A pathologist is a specialist who examines body tissue samples. He or she examines the tissue under a microscope to confirm the diagnosis. Looking for a medical problem in people who do not know whether they have one is called screening. Currently, there are no commonly accepted guidelines on who should have endoscopy, to check for Barrett's esophagus. There are many reasons for the lack of firm recommendations about screening. Among them are the great cost and occasional risk of side effects of the test. Also, the rate of finding Barrett's esophagus is low. Finding the problem early has not been  proven to prevent deaths from cancer. Many caregivers advise that adult patients who are over the age of 36 and have had GERD symptoms for a number of years have endoscopy, to see whether they have Barrett's esophagus. Screening for this condition in people who have no symptoms is not advised. TREATMENT Barrett's esophagus has no cure, other than surgical removal of the esophagus. This is a serious operation. Surgery is advised only for people who have a high risk of developing cancer or who  already have it. Most caregivers recommend treating GERD with acid-blocking drugs. This is sometimes linked to improvement in the extent of the Barrett's tissue. But this approach has not been proven to reduce the risk of cancer. Treating reflux with surgery for GERD also does not seem to cure Barrett's esophagus. Several experimental approaches are under study. One attempts to see whether destroying the Barrett's tissue by heat or other means, through an endoscope, can get rid of the condition. But this approach has potential risks and unknown effectiveness. LOOKING FOR DYSPLASIA AND CANCER Occasional endoscopic examinations to look for early warning signs of cancer are generally advised for people who have Barrett's esophagus. This approach is called surveillance. When people who have Barrett's esophagus develop cancer, the process seems to go through an intermediate stage. In this stage cancer cells appear in the Barrett's tissue. This condition is called dysplasia. It can be seen only in biopsies with a microscope. The process is patchy and cannot be seen directly through the endoscope. So, multiple biopsies must be taken. Even then, it can be missed. The process of change from Barrett's to cancer seems to happen only in a few patients. It is less than 1 percent per year. And it happens over a relatively long period of time. Most caregivers advise that patients with Barrett's esophagus undergo occasional endoscopy to have biopsies. The recommended time between endoscopies varies depending on circumstances. The best time interval has not been decided. TREATMENT FOR DYSPLASIA OR ESOPHAGEAL ADENOCARCINOMA If a person with Barrett's esophagus is found to have dysplasia or cancer, the caregiver will usually recommend surgery. This is if the person is strong enough and has a good chance of being cured. The type of surgery may vary. But it usually involves removing most of the esophagus and pulling the stomach up  into the chest to attach it to what remains of the esophagus. Many patients with Barrett's esophagus are elderly. They may have many other medical problems that make surgery unwise. In these patients, other methods to treat dysplasia are being studied. HOPE THROUGH RESEARCH Many important questions about Barrett's esophagus need further research to:  Find better ways to identify people who have the problem.   Find out what causes it.   Test treatments that may prevent or get rid of it.   Find better treatments for people who have Barrett's esophagus with cancer.  The [employer], and the [employer], sponsor research programs to study Barrett's esophagus. IMPORTANT POINTS TO REMEMBER  In Barrett's esophagus, the cells lining the esophagus change. They become similar to the cells lining the intestine.   Barrett's esophagus is connected with gastroesophageal reflux disease or GERD.   A small number of people with Barrett's esophagus may develop esophageal cancer.   Barrett's esophagus is diagnosed by upper gastrointestinal endoscopy and biopsy.   People who have Barrett's esophagus should have periodic esophageal exams.   Taking acid-blocking drugs for GERD may help  improve Barrett's esophagus.   Removal of the esophagus is recommended only for people who have a high risk of developing cancer or who already have it.  ADDITIONAL RESOURCES For more information about GERD or Barrett's esophagus, contact: [employer] ([employer] [address] [place], [place] [address] Phone: [phone] or [phone] Fax: [phone] Email: iffgd@iffgd .org Internet: www.iffgd.[facility] [employer] Information Clearinghouse [address] [place], [place] [address] Email: nddic@info .[employer].si Document Released: 03/30/2003 Document Re-Released:  06/27/2009 [facility] Patient Information 2011 [place], [place].Diet for GERD or PUD Nutrition therapy can help ease the discomfort of gastroesophageal reflux disease (GERD) and peptic ulcer disease (PUD).  HOME CARE INSTRUCTIONS  Eat your meals slowly, in a relaxed setting.   Eat 5 to 6 small meals per day.   If a food causes distress, stop eating it for a period of time.  FOODS TO AVOID:  Coffee, regular or decaffeinated.  Cola beverages, regular or low calorie.   Tea, regular or decaffeinated.   Pepper.   Cocoa.   High fat foods including meats.   Butter, margarine, hydrogenated oil (trans fats).  Peppermint or spearmint (if you have GERD).   Fruits and vegetables as tolerated.   Alcoholic beverages.   Nicotine (smoking or chewing). This is one of the most potent stimulants to acid production in the gastrointestinal tract.   Any food that seems to aggravate your condition.   If you have questions regarding your diet, call your caregiver's office or a registered dietitian. OTHER TIPS IF YOU HAVE GERD:  Lying flat may make symptoms worse. Keep the head of your bed raised 6 to 9 inches by using a foam wedge or blocks under the legs of the bed.   Do not lay down until 3 hours after eating a meal.   Daily physical activity may help reduce symptoms.  MAKE SURE YOU:   Understand these instructions.   Will watch your condition.   Will get help right away if you are not doing well or get worse.  Document Released: 01/07/2005 Document Re-Released: 05/26/2008 [facility] Patient Information 2011 [place], [place].

## 2010-08-27 NOTE — Assessment & Plan Note (Addendum)
William Rivera is a 75 y.o. caucasian male w/ chronic GERD & Barrett's esophagus with worsening epigastric pain.  Unfortunately due to cost, he has been non-compliant w/ PPI.  His symptoms are 70% better w/ Prevacid.  Due for surveillance EGD 12/12.  Also has GB sludge & his symptoms could be biliary or pancreatitis.  ?etoh intake.     You need to take dexilant 60mg  daily Stop prevacid To ER if severe pain EGD w/ bx 12/12 or sooner if no explanation for pain CBC, CMP, lipase GERD diet & Barrett's literature

## 2010-08-27 NOTE — Progress Notes (Signed)
Referring Provider: [name], MD Primary Care Physician:  [name], MD Primary Gastroenterologist:  Dr. Jena Gauss  Chief Complaint  Patient presents with  . epigastric burning    HPI:  William Rivera is a 75 y.o. male here for follow up for epigastric pain.  Ran out of prevacid 30mg  1 mo ago.  C/o epigastric "burning" 1st thing in AM x 4 weeks.  Was taking zantac 150mg  daily.  Started back on prevacid 30mg  x 1 week.  This seems to help 70%.  Admits to drinking some bourbon on the weekends.  Denies N, V, D, constipation, rectal bleeding or melena.  Denies dysphagia or odynophagia.  Wt stable.  Appetite ok.    Past Medical History  Diagnosis Date  . Hiatal hernia     small  . Hypertension   . Gout   . Barrett's esophagus     last EGD/Bx 12/11  . Helicobacter pylori gastritis 2000    s/p treatment  . Adenomatous colon polyp 2010    Past Surgical History  Procedure Date  . Esophagogastroduodenoscopy 2008    DUODENITIS & GASTRITIS 2o TO NSAIDS/ETOH  . Colonoscopy 2010    simple adenomas  . Neck surgery     DISC REPLACED  . Bladder surgery 1610,9604  . Sinus exploration 2000  . Tonsillectomy     AS A CHILD  . Esophagogastroduodenoscopy 12/2009    short segment Barrett's, small hh, chronic gastritis  . Nasal septum surgery     Current Outpatient Prescriptions  Medication Sig Dispense Refill  . allopurinol (ZYLOPRIM) 100 MG tablet Take 100 mg by mouth daily.        Marland Kitchen atenolol (TENORMIN) 25 MG tablet Take 25 mg by mouth daily.        . lansoprazole (PREVACID) 15 MG capsule Take 15 mg by mouth daily. otc prevacid       . ranitidine (ZANTAC) 150 MG tablet Take 150 mg by mouth 1 dose over 46 hours.        Marland Kitchen dexlansoprazole (DEXILANT) 60 MG capsule Take 1 capsule (60 mg total) by mouth daily.  31 capsule  11  . esomeprazole (NEXIUM) 40 MG capsule Take 1 capsule (40 mg total) by mouth daily.  15 capsule  0  . ramipril (ALTACE) 2.5 MG tablet Take 2.5 mg by mouth  daily. 3 TIMES A WEEK         Allergies as of 08/27/2010  . (No Known Allergies)    Family History  Problem Relation Age of Onset  . Stomach cancer Father 56  . Colon cancer Neg Hx     History   Social History  . Marital Status: Married    Spouse Name: N/A    Number of Children: 1  . Years of Education: N/A   Occupational History  . truck driver    Social History Main Topics  . Smoking status: Former Games developer  . Smokeless tobacco: Never Used   Comment: quit age 52  . Alcohol Use: 0.0 oz/week    .01 drink(s) per week      Was drinking 1 pint of bourbon each weekend. Admits to occasional weekends.  . Drug Use: No  . Sexually Active: Yes -- Male partner(s)    Birth Control/ Protection: None   Other Topics Concern  . Not on file   Social History Narrative  . No narrative on file    Review of Systems: See HPI, otherwise negative ROS  Physical Exam: BP 125/71  Pulse 74  Temp(Src) 97.8 F (36.6 C) (Tympanic)  Ht 5\' 9"  (1.753 m)  Wt 217 lb (98.431 kg)  BMI 32.05 kg/m2 General:   Alert,  Well-developed, well-nourished, pleasant and cooperative in NAD Head:  Normocephalic and atraumatic. Eyes:  Sclera clear, no icterus.   Conjunctiva pink. Mouth:  No deformity or lesions, dentition normal. Neck:  Supple; no masses or thyromegaly. Heart:  Regular rate and rhythm; no murmurs, clicks, rubs,  or gallops. Abdomen:  Soft, nontender and nondistended. No masses, hepatosplenomegaly or hernias noted. Normal bowel sounds, without guarding, and without rebound.   Msk:  Symmetrical without gross deformities. Normal posture. Pulses:  Normal pulses noted. Extremities:  With clubbing.  No edema. Neurologic:  Alert and  oriented x4;  grossly normal neurologically. Skin:  Intact without significant lesions or rashes. Cervical Nodes:  No significant cervical adenopathy. Psych:  Alert and cooperative. Normal mood and affect.

## 2010-08-28 LAB — CBC
HCT: 41.4 % (ref 39.0–52.0)
Hemoglobin: 14.2 g/dL (ref 13.0–17.0)
MCHC: 34.3 g/dL (ref 30.0–36.0)
MCV: 97.2 fL (ref 78.0–100.0)
RDW: 13 % (ref 11.5–15.5)

## 2010-08-28 LAB — COMPLETE METABOLIC PANEL WITH GFR
AST: 17 U/L (ref 0–37)
Albumin: 4 g/dL (ref 3.5–5.2)
BUN: 24 mg/dL — ABNORMAL HIGH (ref 6–23)
Calcium: 9 mg/dL (ref 8.4–10.5)
Chloride: 103 mEq/L (ref 96–112)
Creat: 1.33 mg/dL (ref 0.50–1.35)
GFR, Est Non African American: 53 mL/min — ABNORMAL LOW (ref >60)
Glucose, Bld: 91 mg/dL (ref 70–99)
Potassium: 4.4 mEq/L (ref 3.5–5.3)

## 2010-08-28 LAB — DIFFERENTIAL
Basophils Absolute: 0 10*3/uL (ref 0.0–0.1)
Basophils Relative: 0 % (ref 0–1)
Eosinophils Relative: 1 % (ref 0–5)
Monocytes Absolute: 0.7 10*3/uL (ref 0.1–1.0)
Monocytes Relative: 9 % (ref 3–12)

## 2010-08-28 LAB — LIPASE: Lipase: 53 U/L (ref 0–75)

## 2010-08-28 NOTE — Progress Notes (Signed)
Cc to PCP 

## 2010-08-28 NOTE — Progress Notes (Signed)
Quick Note:  Please let pt know labs all normal Call if not 100% better on dexilant in 1 week or sooner if worse ______

## 2010-08-29 NOTE — Progress Notes (Signed)
Quick Note:  Tried to call pt- LM with wife ______

## 2010-11-06 LAB — GASTRIN: Gastrin: 32 pg/mL (ref 13–115)

## 2010-11-13 ENCOUNTER — Encounter: Payer: Self-pay | Admitting: Internal Medicine

## 2010-11-22 HISTORY — PX: REPLACEMENT TOTAL KNEE: SUR1224

## 2010-12-05 ENCOUNTER — Encounter: Payer: Self-pay | Admitting: Internal Medicine

## 2010-12-17 ENCOUNTER — Encounter: Payer: Self-pay | Admitting: Gastroenterology

## 2010-12-17 ENCOUNTER — Ambulatory Visit (INDEPENDENT_AMBULATORY_CARE_PROVIDER_SITE_OTHER): Payer: Medicare Other | Admitting: Gastroenterology

## 2010-12-17 VITALS — BP 133/86 | HR 77 | Temp 98.1°F | Ht 69.0 in | Wt 214.4 lb

## 2010-12-17 DIAGNOSIS — K2270 Barrett's esophagus without dysplasia: Secondary | ICD-10-CM

## 2010-12-17 DIAGNOSIS — R1013 Epigastric pain: Secondary | ICD-10-CM

## 2010-12-17 NOTE — Assessment & Plan Note (Signed)
Refer to assessment and plan for epigastric pain. 

## 2010-12-17 NOTE — Assessment & Plan Note (Signed)
Recurrent epigastric pain in the setting of recurrent alcohol use. States he was doing better since her last offices until he started consuming alcohol again. States he was not drinking heavily but did have some liquor with his brother on several occasions. Denies any alcohol use now in 2 weeks. Had been over 4 months prior to recent consumption. He has been noncompliant with PPI therapy for his Barrett's esophagus given cost of the medication. He was not able to afford the [facility]. Nexium was more affordable but did not seem to help his symptoms. Currently on Prilosec OTC. Resume this just 6 days ago. We'll provide him with #28 samples to help with the experience. He was advised that although the Zantac seems to be helping his symptoms, he needs to be on PPI therapy for his Barrett's esophagus. He states understanding. Schedule for EGD with Dr. Darrick Penna in the near future. He states he's adequate sedation in the past with conscious sedation.  I have discussed the risks, alternatives, benefits with regards to but not limited to the risk of reaction to medication, bleeding, infection, perforation and the patient is agreeable to proceed. Written consent to be obtained.  He has been advised to avoid all alcohol consumption. Continue Prilosec OTC for now.

## 2010-12-17 NOTE — Patient Instructions (Signed)
Please avoid alcohol use. It is bad for your stomach, Barrett's esophagus, liver. It also increases your risk of multiple cancers. We have scheduled you for an upper endoscopy. Please see separate instructions. It is very important that you take a medication such as Prilosec for your Barrett's esophagus. I provided you with some samples of Prilosec OTC today.

## 2010-12-17 NOTE — Progress Notes (Signed)
Cc to PCP 

## 2010-12-17 NOTE — Progress Notes (Signed)
Primary Care Physician:  [name], MD  Primary Gastroenterologist:  Jonette Eva, MD   Chief Complaint  Patient presents with  . Heartburn    HPI:  William Rivera is a 75 y.o. male here to schedule surveillance upper endoscopy. He was diagnosed with Barrett's esophagus last December. Procedure was done by Dr. Jena Gauss Dearing Dr. Darrick Penna absence. He is due back for one-year followup surveillance EGD. He was last seen in August. He was having epigastric burning off of PPI. He's had difficulty affording the PPI medications. He tells me he was feeling better after his last office visit here. However he started having recurrent burning in epigastrium more since started back drinking etoh. Had went four months without any etoh but none for past two weeks. Prior to his last office visit he stated he was drinking on weekends only occasionally. No n/v, dysphagia. Some burning with swallowing. Weight up 222 since 08/2010 but now weight down again to 214. Denies any constipation, diarrhea, melena, rectal bleeding. He has been on multiple PPIs in the past. He was unable to afford the Dexilant although it seemed do really well for him. He has tried Prilosec OTC and Prevacid 24 hour without resolution of epigastric burning. He states Zantac seems to work the most for this symptom. He tried Nexium in the past but it did not seem to do much.   Current Outpatient Prescriptions  Medication Sig Dispense Refill  . allopurinol (ZYLOPRIM) 100 MG tablet Take 100 mg by mouth daily.        Marland Kitchen atenolol (TENORMIN) 25 MG tablet Take 25 mg by mouth daily.        Marland Kitchen omeprazole (PRILOSEC OTC) 20 MG tablet Take 20 mg by mouth daily.        . ranitidine (ZANTAC) 150 MG tablet Take 150 mg by mouth 2 (two) times daily.         Allergies as of 12/17/2010  . (No Known Allergies)    Past Medical History  Diagnosis Date  . Hiatal hernia     small  . Hypertension   . Gout   . Barrett's esophagus     last EGD/Bx 12/11  .  Helicobacter pylori gastritis 2000    s/p treatment  . Adenomatous colon polyp 2010    Past Surgical History  Procedure Date  . Esophagogastroduodenoscopy 2008    DUODENITIS & GASTRITIS 2o TO NSAIDS/ETOH  . Colonoscopy 2010    simple adenomas  . Neck surgery     DISC REPLACED  . Bladder surgery 1610,9604  . Sinus exploration 2000  . Tonsillectomy     AS A CHILD  . Esophagogastroduodenoscopy 12/2009    short segment Barrett's, small hh, chronic gastritis  . Nasal septum surgery     Family History  Problem Relation Age of Onset  . Stomach cancer Father 35    deceased  . Colon cancer Neg Hx     History   Social History  . Marital Status: Married    Spouse Name: N/A    Number of Children: 1  . Years of Education: N/A   Occupational History  . truck driver    Social History Main Topics  . Smoking status: Former Games developer  . Smokeless tobacco: Never Used   Comment: quit age 67  . Alcohol Use: 0.0 oz/week    .01 drink(s) per week      Was drinking 1 pint of bourbon each weekend. Admits to occasional weekends.  . Drug Use:  No  . Sexually Active: Yes -- Male partner(s)    Birth Control/ Protection: None   Other Topics Concern  . Not on file   Social History Narrative  . No narrative on file      ROS:  General: See history of present illness. Negative for anorexia, fever, chills, fatigue, weakness. Eyes: Negative for vision changes.  ENT: Negative for hoarseness, difficulty swallowing , nasal congestion. CV: Negative for chest pain, angina, palpitations, dyspnea on exertion, peripheral edema.  Respiratory: Negative for dyspnea at rest, dyspnea on exertion, cough, sputum, wheezing.  GI: See history of present illness. GU:  Negative for dysuria, hematuria, urinary incontinence, urinary frequency, nocturnal urination.  MS: Negative for joint pain, low back pain.  Derm: Negative for rash or itching.  Neuro: Negative for weakness, abnormal sensation, seizure,  frequent headaches, memory loss, confusion.  Psych: Negative for anxiety, depression, suicidal ideation, hallucinations.  Endo: See history of present illness.   Heme: Negative for bruising or bleeding. Allergy: Negative for rash or hives.    Physical Examination:  BP 133/86  Pulse 77  Temp(Src) 98.1 F (36.7 C) (Temporal)  Ht 5\' 9"  (1.753 m)  Wt 214 lb 6.4 oz (97.251 kg)  BMI 31.66 kg/m2   General: Well-nourished, well-developed in no acute distress.  Head: Normocephalic, atraumatic.   Eyes: Conjunctiva pink, no icterus. Mouth: Oropharyngeal mucosa moist and pink , no lesions erythema or exudate. Neck: Supple without thyromegaly, masses, or lymphadenopathy.  Lungs: Clear to auscultation bilaterally.  Heart: Regular rate and rhythm, no murmurs rubs or gallops.  Abdomen: Bowel sounds are normal, nontender, nondistended, no hepatosplenomegaly or masses, no abdominal bruits or    hernia , no rebound or guarding.   Rectal: Not performed. Extremities: No lower extremity edema. No clubbing or deformities.  Neuro: Alert and oriented x 4 , grossly normal neurologically.  Skin: Warm and dry, no rash or jaundice.   Psych: Alert and cooperative, normal mood and affect.

## 2010-12-19 NOTE — Progress Notes (Signed)
REVIEWED. AGREE. 

## 2010-12-31 ENCOUNTER — Encounter (HOSPITAL_COMMUNITY): Payer: Self-pay | Admitting: Pharmacy Technician

## 2011-01-03 MED ORDER — SODIUM CHLORIDE 0.45 % IV SOLN
Freq: Once | INTRAVENOUS | Status: AC
  Administered 2011-01-04: 11:00:00 via INTRAVENOUS

## 2011-01-04 ENCOUNTER — Ambulatory Visit (HOSPITAL_COMMUNITY)
Admission: RE | Admit: 2011-01-04 | Discharge: 2011-01-04 | Disposition: A | Discharge status: Home / Self Care | Payer: Medicare Other | Source: Ambulatory Visit | Attending: Gastroenterology | Admitting: Gastroenterology

## 2011-01-04 ENCOUNTER — Other Ambulatory Visit: Payer: Self-pay | Admitting: Gastroenterology

## 2011-01-04 ENCOUNTER — Encounter (HOSPITAL_COMMUNITY)
Admission: RE | Disposition: A | Discharge status: Home / Self Care | Payer: Self-pay | Source: Ambulatory Visit | Attending: Gastroenterology

## 2011-01-04 ENCOUNTER — Encounter (HOSPITAL_COMMUNITY): Payer: Self-pay

## 2011-01-04 DIAGNOSIS — R1013 Epigastric pain: Secondary | ICD-10-CM

## 2011-01-04 DIAGNOSIS — K2270 Barrett's esophagus without dysplasia: Secondary | ICD-10-CM

## 2011-01-04 DIAGNOSIS — K2970 Gastritis, unspecified, without bleeding: Secondary | ICD-10-CM

## 2011-01-04 DIAGNOSIS — I10 Essential (primary) hypertension: Secondary | ICD-10-CM | POA: Insufficient documentation

## 2011-01-04 DIAGNOSIS — K219 Gastro-esophageal reflux disease without esophagitis: Secondary | ICD-10-CM | POA: Insufficient documentation

## 2011-01-04 DIAGNOSIS — K2940 Chronic atrophic gastritis without bleeding: Secondary | ICD-10-CM | POA: Insufficient documentation

## 2011-01-04 DIAGNOSIS — K2990 Gastroduodenitis, unspecified, without bleeding: Secondary | ICD-10-CM

## 2011-01-04 DIAGNOSIS — Z79899 Other long term (current) drug therapy: Secondary | ICD-10-CM | POA: Insufficient documentation

## 2011-01-04 HISTORY — PX: ESOPHAGOGASTRODUODENOSCOPY: SHX5428

## 2011-01-04 SURGERY — EGD (ESOPHAGOGASTRODUODENOSCOPY)
Anesthesia: Moderate Sedation

## 2011-01-04 MED ORDER — MEPERIDINE HCL 100 MG/ML IJ SOLN
INTRAMUSCULAR | Status: DC | PRN
  Administered 2011-01-04 (×2): 50 mg via INTRAVENOUS

## 2011-01-04 MED ORDER — STERILE WATER FOR IRRIGATION IR SOLN
Status: DC | PRN
  Administered 2011-01-04: 12:00:00

## 2011-01-04 MED ORDER — MIDAZOLAM HCL 5 MG/5ML IJ SOLN
INTRAMUSCULAR | Status: DC | PRN
  Administered 2011-01-04 (×2): 2 mg via INTRAVENOUS

## 2011-01-04 MED ORDER — MIDAZOLAM HCL 5 MG/5ML IJ SOLN
INTRAMUSCULAR | Status: AC
  Filled 2011-01-04: qty 5

## 2011-01-04 MED ORDER — MEPERIDINE HCL 100 MG/ML IJ SOLN
INTRAMUSCULAR | Status: AC
  Filled 2011-01-04: qty 1

## 2011-01-04 NOTE — Interval H&P Note (Signed)
History and Physical Interval Note:  01/04/2011 12:06 PM  William Rivera  has presented today for surgery, with the diagnosis of epigastric pain  The various methods of treatment have been discussed with the patient and family. After consideration of risks, benefits and other options for treatment, the patient has consented to  Procedure(s): ESOPHAGOGASTRODUODENOSCOPY (EGD) as a surgical intervention .  The patients' history has been reviewed, patient examined, no change in status, stable for surgery.  I have reviewed the patients' chart and labs.  Questions were answered to the patient's satisfaction.     [employer]

## 2011-01-04 NOTE — H&P (View-Only) (Signed)
REVIEWED. AGREE. 

## 2011-01-09 ENCOUNTER — Telehealth: Payer: Self-pay | Admitting: Gastroenterology

## 2011-01-09 NOTE — Telephone Encounter (Signed)
Please call patient with his procedure results. [phone]

## 2011-01-10 ENCOUNTER — Telehealth: Payer: Self-pay | Admitting: Gastroenterology

## 2011-01-10 NOTE — Op Note (Signed)
[facility] [address] [place], [place]  [address]  ENDOSCOPY PROCEDURE REPORT  PATIENT:  William Rivera, William Rivera  MR#:  [id number] BIRTHDATE:  [birth year], 75 yrs. old  GENDER:  male  ENDOSCOPIST:  Jonette Eva, MD Referred by:  Assunta Found, M.D.  PROCEDURE DATE:  01/04/2011 PROCEDURE:  EGD with biopsy, 43239 ASA CLASS: INDICATIONS:  GERD, Screening for Barrett's esopahgus in a GERD patient., abdominal pain  MEDICATIONS:   Demerol 100 mg IV, Versed 4 mg IV TOPICAL ANESTHETIC:  Cetacaine Spray  DESCRIPTION OF PROCEDURE:     Physical exam was performed. Informed consent was obtained from the patient after explaining the benefits, risks, and alternatives to the procedure.  The patient was connected to the monitor and placed in the left lateral position.  Continuous oxygen was provided by nasal cannula and IV medicine administered through an indwelling cannula.  After administration of sedation, the patient's esophagus was intubated and the EG-[id number] ([id number]) endoscope was advanced under direct visualization to the second portion of the duodenum.  The scope was removed slowly by carefully examining the color, texture, anatomy, and integrity of the mucosa on the way out.  The patient was recovered in endoscopy and discharged home in satisfactory condition. <<PROCEDUREIMAGES>>  Mild gastritis was found & BIOPSIED VIA COLD FORCEPS.  There were columnar-type mucosal changes in the distal esophagus, that could represent Barrett's esophagus, V. IRREGULAR Z-LINE. BIOPSIES OBTAINED VIA COLD FORCEPS. NL DUODENUM.  COMPLICATIONS:    None  ENDOSCOPIC IMPRESSION: 1) Mild gastritis 2) Barrett's, possible  RECOMMENDATIONS: OMP DAILY FOREVER AWAIT BIOPSIES OPV IN 6 MOS MINIMIZE ETOH USE  REPEAT EXAM:  No  ______________________________ Jonette Eva, MD  CC:  n. eSIGNED:   Lyden Redner at 01/10/2011 01:49 PM  Lyna Poser, [phone]

## 2011-01-10 NOTE — Telephone Encounter (Signed)
Results Cc to PCP  

## 2011-01-10 NOTE — Telephone Encounter (Signed)
LMOM to call.

## 2011-01-10 NOTE — Telephone Encounter (Signed)
Please call pt. His stomach Bx shows mild gastritis. Continue OMP 30 minutes prior to meals ONCE DAILY. HIS ESOPHAGEAL BIOPSIES SHOW NO BARRETT'S. REPEAT EGD IN 3 YEARS IF THE BENEFITS OUTWEIGH THE RISKS. OPV IN 6 MOS.

## 2011-01-10 NOTE — Telephone Encounter (Signed)
Pt returned call and was informed.  

## 2011-01-11 NOTE — Telephone Encounter (Signed)
Is this patient a RMR or SF patient?

## 2011-01-21 ENCOUNTER — Encounter (HOSPITAL_COMMUNITY): Payer: Self-pay | Admitting: Gastroenterology

## 2011-01-23 DIAGNOSIS — E875 Hyperkalemia: Secondary | ICD-10-CM | POA: Diagnosis not present

## 2011-01-23 DIAGNOSIS — R809 Proteinuria, unspecified: Secondary | ICD-10-CM | POA: Diagnosis not present

## 2011-01-23 DIAGNOSIS — I10 Essential (primary) hypertension: Secondary | ICD-10-CM | POA: Diagnosis not present

## 2011-01-23 DIAGNOSIS — N182 Chronic kidney disease, stage 2 (mild): Secondary | ICD-10-CM | POA: Diagnosis not present

## 2011-01-25 NOTE — Telephone Encounter (Signed)
Reminder in epic to follow up in 6 months and to have repeat EGD in 3 years

## 2011-01-29 DIAGNOSIS — H2589 Other age-related cataract: Secondary | ICD-10-CM | POA: Diagnosis not present

## 2011-02-01 DIAGNOSIS — Z961 Presence of intraocular lens: Secondary | ICD-10-CM | POA: Diagnosis not present

## 2011-02-01 DIAGNOSIS — I10 Essential (primary) hypertension: Secondary | ICD-10-CM | POA: Diagnosis not present

## 2011-02-01 DIAGNOSIS — K219 Gastro-esophageal reflux disease without esophagitis: Secondary | ICD-10-CM | POA: Diagnosis not present

## 2011-02-01 DIAGNOSIS — Z9849 Cataract extraction status, unspecified eye: Secondary | ICD-10-CM | POA: Diagnosis not present

## 2011-02-01 DIAGNOSIS — H2589 Other age-related cataract: Secondary | ICD-10-CM | POA: Diagnosis not present

## 2011-02-27 DIAGNOSIS — L219 Seborrheic dermatitis, unspecified: Secondary | ICD-10-CM | POA: Diagnosis not present

## 2011-02-27 DIAGNOSIS — L259 Unspecified contact dermatitis, unspecified cause: Secondary | ICD-10-CM | POA: Diagnosis not present

## 2011-02-27 DIAGNOSIS — Z6830 Body mass index (BMI) 30.0-30.9, adult: Secondary | ICD-10-CM | POA: Diagnosis not present

## 2011-03-05 DIAGNOSIS — E782 Mixed hyperlipidemia: Secondary | ICD-10-CM | POA: Diagnosis not present

## 2011-03-05 DIAGNOSIS — I10 Essential (primary) hypertension: Secondary | ICD-10-CM | POA: Diagnosis not present

## 2011-03-06 DIAGNOSIS — Z79899 Other long term (current) drug therapy: Secondary | ICD-10-CM | POA: Diagnosis not present

## 2011-03-06 DIAGNOSIS — M1710 Unilateral primary osteoarthritis, unspecified knee: Secondary | ICD-10-CM | POA: Diagnosis not present

## 2011-03-06 DIAGNOSIS — E782 Mixed hyperlipidemia: Secondary | ICD-10-CM | POA: Diagnosis not present

## 2011-03-06 DIAGNOSIS — M25569 Pain in unspecified knee: Secondary | ICD-10-CM | POA: Diagnosis not present

## 2011-03-16 DIAGNOSIS — L259 Unspecified contact dermatitis, unspecified cause: Secondary | ICD-10-CM | POA: Diagnosis not present

## 2011-03-16 DIAGNOSIS — M159 Polyosteoarthritis, unspecified: Secondary | ICD-10-CM | POA: Diagnosis not present

## 2011-03-16 DIAGNOSIS — I10 Essential (primary) hypertension: Secondary | ICD-10-CM | POA: Diagnosis not present

## 2011-03-18 DIAGNOSIS — Z125 Encounter for screening for malignant neoplasm of prostate: Secondary | ICD-10-CM | POA: Diagnosis not present

## 2011-03-18 DIAGNOSIS — I10 Essential (primary) hypertension: Secondary | ICD-10-CM | POA: Diagnosis not present

## 2011-03-18 DIAGNOSIS — M25569 Pain in unspecified knee: Secondary | ICD-10-CM | POA: Diagnosis not present

## 2011-03-18 DIAGNOSIS — Z79899 Other long term (current) drug therapy: Secondary | ICD-10-CM | POA: Diagnosis not present

## 2011-04-12 DIAGNOSIS — M1710 Unilateral primary osteoarthritis, unspecified knee: Secondary | ICD-10-CM | POA: Diagnosis not present

## 2011-05-02 DIAGNOSIS — Z6828 Body mass index (BMI) 28.0-28.9, adult: Secondary | ICD-10-CM | POA: Diagnosis not present

## 2011-05-02 DIAGNOSIS — T148 Other injury of unspecified body region: Secondary | ICD-10-CM | POA: Diagnosis not present

## 2011-05-02 DIAGNOSIS — G4700 Insomnia, unspecified: Secondary | ICD-10-CM | POA: Diagnosis not present

## 2011-06-26 DIAGNOSIS — H40019 Open angle with borderline findings, low risk, unspecified eye: Secondary | ICD-10-CM | POA: Diagnosis not present

## 2011-07-02 DIAGNOSIS — M1710 Unilateral primary osteoarthritis, unspecified knee: Secondary | ICD-10-CM | POA: Diagnosis not present

## 2011-07-15 DIAGNOSIS — M1710 Unilateral primary osteoarthritis, unspecified knee: Secondary | ICD-10-CM | POA: Diagnosis not present

## 2011-07-23 ENCOUNTER — Encounter: Payer: Self-pay | Admitting: Gastroenterology

## 2011-07-31 DIAGNOSIS — G608 Other hereditary and idiopathic neuropathies: Secondary | ICD-10-CM | POA: Diagnosis not present

## 2011-07-31 DIAGNOSIS — G4700 Insomnia, unspecified: Secondary | ICD-10-CM | POA: Diagnosis not present

## 2011-08-12 DIAGNOSIS — Z79899 Other long term (current) drug therapy: Secondary | ICD-10-CM | POA: Diagnosis not present

## 2011-08-12 DIAGNOSIS — D649 Anemia, unspecified: Secondary | ICD-10-CM | POA: Diagnosis not present

## 2011-08-12 DIAGNOSIS — R809 Proteinuria, unspecified: Secondary | ICD-10-CM | POA: Diagnosis not present

## 2011-08-12 DIAGNOSIS — N189 Chronic kidney disease, unspecified: Secondary | ICD-10-CM | POA: Diagnosis not present

## 2011-08-14 DIAGNOSIS — D649 Anemia, unspecified: Secondary | ICD-10-CM | POA: Diagnosis not present

## 2011-08-14 DIAGNOSIS — I10 Essential (primary) hypertension: Secondary | ICD-10-CM | POA: Diagnosis not present

## 2011-08-26 DIAGNOSIS — R21 Rash and other nonspecific skin eruption: Secondary | ICD-10-CM | POA: Diagnosis not present

## 2011-08-26 DIAGNOSIS — Z6829 Body mass index (BMI) 29.0-29.9, adult: Secondary | ICD-10-CM | POA: Diagnosis not present

## 2011-08-27 DIAGNOSIS — M1710 Unilateral primary osteoarthritis, unspecified knee: Secondary | ICD-10-CM | POA: Diagnosis not present

## 2011-10-02 DIAGNOSIS — M1710 Unilateral primary osteoarthritis, unspecified knee: Secondary | ICD-10-CM | POA: Diagnosis not present

## 2011-10-09 DIAGNOSIS — Z01811 Encounter for preprocedural respiratory examination: Secondary | ICD-10-CM | POA: Diagnosis not present

## 2011-10-09 DIAGNOSIS — M1710 Unilateral primary osteoarthritis, unspecified knee: Secondary | ICD-10-CM | POA: Diagnosis not present

## 2011-10-09 DIAGNOSIS — Z01812 Encounter for preprocedural laboratory examination: Secondary | ICD-10-CM | POA: Diagnosis not present

## 2011-10-09 DIAGNOSIS — Z79899 Other long term (current) drug therapy: Secondary | ICD-10-CM | POA: Diagnosis not present

## 2011-10-09 DIAGNOSIS — Z01818 Encounter for other preprocedural examination: Secondary | ICD-10-CM | POA: Diagnosis not present

## 2011-10-15 DIAGNOSIS — R7301 Impaired fasting glucose: Secondary | ICD-10-CM | POA: Diagnosis not present

## 2011-10-15 DIAGNOSIS — E785 Hyperlipidemia, unspecified: Secondary | ICD-10-CM | POA: Diagnosis not present

## 2011-10-15 DIAGNOSIS — Z6829 Body mass index (BMI) 29.0-29.9, adult: Secondary | ICD-10-CM | POA: Diagnosis not present

## 2011-10-15 DIAGNOSIS — Z01818 Encounter for other preprocedural examination: Secondary | ICD-10-CM | POA: Diagnosis not present

## 2011-10-15 DIAGNOSIS — I10 Essential (primary) hypertension: Secondary | ICD-10-CM | POA: Diagnosis not present

## 2011-11-06 DIAGNOSIS — I10 Essential (primary) hypertension: Secondary | ICD-10-CM | POA: Diagnosis not present

## 2011-11-06 DIAGNOSIS — E782 Mixed hyperlipidemia: Secondary | ICD-10-CM | POA: Diagnosis not present

## 2011-11-12 DIAGNOSIS — E782 Mixed hyperlipidemia: Secondary | ICD-10-CM | POA: Diagnosis not present

## 2011-11-12 DIAGNOSIS — I10 Essential (primary) hypertension: Secondary | ICD-10-CM | POA: Diagnosis not present

## 2011-11-12 DIAGNOSIS — Z01810 Encounter for preprocedural cardiovascular examination: Secondary | ICD-10-CM | POA: Diagnosis not present

## 2011-11-22 DIAGNOSIS — Z23 Encounter for immunization: Secondary | ICD-10-CM | POA: Diagnosis not present

## 2011-11-26 DIAGNOSIS — M1710 Unilateral primary osteoarthritis, unspecified knee: Secondary | ICD-10-CM | POA: Diagnosis not present

## 2011-11-29 DIAGNOSIS — K219 Gastro-esophageal reflux disease without esophagitis: Secondary | ICD-10-CM | POA: Diagnosis not present

## 2011-11-29 DIAGNOSIS — I10 Essential (primary) hypertension: Secondary | ICD-10-CM | POA: Diagnosis not present

## 2011-11-29 DIAGNOSIS — M159 Polyosteoarthritis, unspecified: Secondary | ICD-10-CM | POA: Diagnosis not present

## 2011-11-29 DIAGNOSIS — Z96659 Presence of unspecified artificial knee joint: Secondary | ICD-10-CM | POA: Diagnosis not present

## 2011-11-29 DIAGNOSIS — M109 Gout, unspecified: Secondary | ICD-10-CM | POA: Diagnosis present

## 2011-11-29 DIAGNOSIS — G8918 Other acute postprocedural pain: Secondary | ICD-10-CM | POA: Diagnosis not present

## 2011-11-29 DIAGNOSIS — IMO0002 Reserved for concepts with insufficient information to code with codable children: Secondary | ICD-10-CM | POA: Diagnosis not present

## 2011-11-29 DIAGNOSIS — M1710 Unilateral primary osteoarthritis, unspecified knee: Secondary | ICD-10-CM | POA: Diagnosis not present

## 2011-12-04 DIAGNOSIS — M25569 Pain in unspecified knee: Secondary | ICD-10-CM | POA: Diagnosis not present

## 2011-12-06 DIAGNOSIS — M25569 Pain in unspecified knee: Secondary | ICD-10-CM | POA: Diagnosis not present

## 2011-12-09 DIAGNOSIS — M25569 Pain in unspecified knee: Secondary | ICD-10-CM | POA: Diagnosis not present

## 2011-12-11 DIAGNOSIS — M25569 Pain in unspecified knee: Secondary | ICD-10-CM | POA: Diagnosis not present

## 2011-12-13 DIAGNOSIS — M25569 Pain in unspecified knee: Secondary | ICD-10-CM | POA: Diagnosis not present

## 2011-12-16 DIAGNOSIS — M25569 Pain in unspecified knee: Secondary | ICD-10-CM | POA: Diagnosis not present

## 2011-12-17 DIAGNOSIS — M25569 Pain in unspecified knee: Secondary | ICD-10-CM | POA: Diagnosis not present

## 2011-12-23 DIAGNOSIS — M545 Low back pain: Secondary | ICD-10-CM | POA: Diagnosis not present

## 2011-12-25 DIAGNOSIS — M25569 Pain in unspecified knee: Secondary | ICD-10-CM | POA: Diagnosis not present

## 2011-12-26 DIAGNOSIS — M545 Low back pain: Secondary | ICD-10-CM | POA: Diagnosis not present

## 2011-12-26 DIAGNOSIS — IMO0002 Reserved for concepts with insufficient information to code with codable children: Secondary | ICD-10-CM | POA: Diagnosis not present

## 2011-12-26 DIAGNOSIS — M5137 Other intervertebral disc degeneration, lumbosacral region: Secondary | ICD-10-CM | POA: Diagnosis not present

## 2011-12-26 DIAGNOSIS — M47817 Spondylosis without myelopathy or radiculopathy, lumbosacral region: Secondary | ICD-10-CM | POA: Diagnosis not present

## 2011-12-27 DIAGNOSIS — M25569 Pain in unspecified knee: Secondary | ICD-10-CM | POA: Diagnosis not present

## 2011-12-30 DIAGNOSIS — M25569 Pain in unspecified knee: Secondary | ICD-10-CM | POA: Diagnosis not present

## 2012-01-01 DIAGNOSIS — M25569 Pain in unspecified knee: Secondary | ICD-10-CM | POA: Diagnosis not present

## 2012-01-03 DIAGNOSIS — M25569 Pain in unspecified knee: Secondary | ICD-10-CM | POA: Diagnosis not present

## 2012-01-06 DIAGNOSIS — M25569 Pain in unspecified knee: Secondary | ICD-10-CM | POA: Diagnosis not present

## 2012-01-08 DIAGNOSIS — M25569 Pain in unspecified knee: Secondary | ICD-10-CM | POA: Diagnosis not present

## 2012-01-10 DIAGNOSIS — M1710 Unilateral primary osteoarthritis, unspecified knee: Secondary | ICD-10-CM | POA: Diagnosis not present

## 2012-01-10 DIAGNOSIS — M25569 Pain in unspecified knee: Secondary | ICD-10-CM | POA: Diagnosis not present

## 2012-01-13 DIAGNOSIS — M1710 Unilateral primary osteoarthritis, unspecified knee: Secondary | ICD-10-CM | POA: Diagnosis not present

## 2012-01-13 DIAGNOSIS — M25569 Pain in unspecified knee: Secondary | ICD-10-CM | POA: Diagnosis not present

## 2012-01-17 DIAGNOSIS — M25569 Pain in unspecified knee: Secondary | ICD-10-CM | POA: Diagnosis not present

## 2012-01-17 DIAGNOSIS — M1710 Unilateral primary osteoarthritis, unspecified knee: Secondary | ICD-10-CM | POA: Diagnosis not present

## 2012-01-20 DIAGNOSIS — M25569 Pain in unspecified knee: Secondary | ICD-10-CM | POA: Diagnosis not present

## 2012-01-20 DIAGNOSIS — M1710 Unilateral primary osteoarthritis, unspecified knee: Secondary | ICD-10-CM | POA: Diagnosis not present

## 2012-01-24 DIAGNOSIS — M25569 Pain in unspecified knee: Secondary | ICD-10-CM | POA: Diagnosis not present

## 2012-01-29 ENCOUNTER — Other Ambulatory Visit: Payer: Self-pay | Admitting: Dermatology

## 2012-01-29 DIAGNOSIS — L570 Actinic keratosis: Secondary | ICD-10-CM | POA: Diagnosis not present

## 2012-01-29 DIAGNOSIS — D485 Neoplasm of uncertain behavior of skin: Secondary | ICD-10-CM | POA: Diagnosis not present

## 2012-02-04 DIAGNOSIS — R7309 Other abnormal glucose: Secondary | ICD-10-CM | POA: Diagnosis not present

## 2012-02-04 DIAGNOSIS — G4700 Insomnia, unspecified: Secondary | ICD-10-CM | POA: Diagnosis not present

## 2012-02-04 DIAGNOSIS — N410 Acute prostatitis: Secondary | ICD-10-CM | POA: Diagnosis not present

## 2012-02-17 DIAGNOSIS — Z79899 Other long term (current) drug therapy: Secondary | ICD-10-CM | POA: Diagnosis not present

## 2012-02-17 DIAGNOSIS — I10 Essential (primary) hypertension: Secondary | ICD-10-CM | POA: Diagnosis not present

## 2012-02-17 DIAGNOSIS — R809 Proteinuria, unspecified: Secondary | ICD-10-CM | POA: Diagnosis not present

## 2012-02-17 DIAGNOSIS — D649 Anemia, unspecified: Secondary | ICD-10-CM | POA: Diagnosis not present

## 2012-02-17 DIAGNOSIS — E559 Vitamin D deficiency, unspecified: Secondary | ICD-10-CM | POA: Diagnosis not present

## 2012-02-17 DIAGNOSIS — N189 Chronic kidney disease, unspecified: Secondary | ICD-10-CM | POA: Diagnosis not present

## 2012-02-19 DIAGNOSIS — M109 Gout, unspecified: Secondary | ICD-10-CM | POA: Diagnosis not present

## 2012-02-19 DIAGNOSIS — I10 Essential (primary) hypertension: Secondary | ICD-10-CM | POA: Diagnosis not present

## 2012-03-12 DIAGNOSIS — M1710 Unilateral primary osteoarthritis, unspecified knee: Secondary | ICD-10-CM | POA: Diagnosis not present

## 2012-06-22 DIAGNOSIS — R809 Proteinuria, unspecified: Secondary | ICD-10-CM | POA: Diagnosis not present

## 2012-06-22 DIAGNOSIS — M109 Gout, unspecified: Secondary | ICD-10-CM | POA: Diagnosis not present

## 2012-06-22 DIAGNOSIS — Z79899 Other long term (current) drug therapy: Secondary | ICD-10-CM | POA: Diagnosis not present

## 2012-06-30 DIAGNOSIS — H40019 Open angle with borderline findings, low risk, unspecified eye: Secondary | ICD-10-CM | POA: Diagnosis not present

## 2012-06-30 DIAGNOSIS — H04129 Dry eye syndrome of unspecified lacrimal gland: Secondary | ICD-10-CM | POA: Diagnosis not present

## 2012-07-08 DIAGNOSIS — I10 Essential (primary) hypertension: Secondary | ICD-10-CM | POA: Diagnosis not present

## 2012-07-08 DIAGNOSIS — M109 Gout, unspecified: Secondary | ICD-10-CM | POA: Diagnosis not present

## 2012-07-31 DIAGNOSIS — E785 Hyperlipidemia, unspecified: Secondary | ICD-10-CM | POA: Diagnosis not present

## 2012-07-31 DIAGNOSIS — N62 Hypertrophy of breast: Secondary | ICD-10-CM | POA: Diagnosis not present

## 2012-07-31 DIAGNOSIS — Z6830 Body mass index (BMI) 30.0-30.9, adult: Secondary | ICD-10-CM | POA: Diagnosis not present

## 2012-08-03 DIAGNOSIS — Z125 Encounter for screening for malignant neoplasm of prostate: Secondary | ICD-10-CM | POA: Diagnosis not present

## 2012-08-03 DIAGNOSIS — Z6830 Body mass index (BMI) 30.0-30.9, adult: Secondary | ICD-10-CM | POA: Diagnosis not present

## 2012-08-03 DIAGNOSIS — N62 Hypertrophy of breast: Secondary | ICD-10-CM | POA: Diagnosis not present

## 2012-08-03 DIAGNOSIS — E785 Hyperlipidemia, unspecified: Secondary | ICD-10-CM | POA: Diagnosis not present

## 2012-08-04 ENCOUNTER — Encounter: Payer: Self-pay | Admitting: Gastroenterology

## 2012-08-06 ENCOUNTER — Encounter: Payer: Self-pay | Admitting: Gastroenterology

## 2012-08-06 ENCOUNTER — Ambulatory Visit (INDEPENDENT_AMBULATORY_CARE_PROVIDER_SITE_OTHER): Payer: Medicare Other | Admitting: Gastroenterology

## 2012-08-06 VITALS — BP 127/79 | HR 79 | Temp 98.4°F | Ht 69.0 in | Wt 223.4 lb

## 2012-08-06 DIAGNOSIS — D126 Benign neoplasm of colon, unspecified: Secondary | ICD-10-CM | POA: Diagnosis not present

## 2012-08-06 DIAGNOSIS — K219 Gastro-esophageal reflux disease without esophagitis: Secondary | ICD-10-CM | POA: Diagnosis not present

## 2012-08-06 DIAGNOSIS — K2270 Barrett's esophagus without dysplasia: Secondary | ICD-10-CM | POA: Diagnosis not present

## 2012-08-06 NOTE — Assessment & Plan Note (Signed)
DISCUSSED WITH PT .  PT NEEDS TCS IN 2015, BUT HAVING EGD IN 2016. PREFERS TO DO BOTH AT THE SAME TIME. OPV IN 2 YEARS.

## 2012-08-06 NOTE — Progress Notes (Signed)
  Subjective:    Patient ID: William Rivera, male    DOB: [birth year], 77 y.o.   MRN: [id number]  [name], MD  HPI Last seen in 2012. Was taking Zantac instead of Prilosec and having chest discomfort. Changing to Prilosec today. Doesn't have reflux much. May have burning or hurting stomach: 1-2x/wk. IF HE BEHAVES HIMSELF, HE DOES GOOD. WONDERING IF HE NEEDS A SCOPE AGAIN. NO PROBLEMS SWALLOWING. WEIGHT LOSS AFTER KNEE SURGERY THIS FALL.  PT DENIES FEVER, CHILLS, BRBPR, nausea, vomiting, melena, diarrhea, constipation, abd pain, problems swallowing, problems with sedation.   Past Medical History  Diagnosis Date  . Hypertension   . Gout   . Barrett's esophagus     last EGD/Bx 12/11  . Helicobacter pylori gastritis 2000    s/p treatment  . Adenomatous colon polyp 2010   Past Surgical History  Procedure Laterality Date  . Esophagogastroduodenoscopy  2008    DUODENITIS & GASTRITIS 2o TO NSAIDS/ETOH  . Colonoscopy  2010    simple adenomas  . Neck surgery      DISC REPLACED  . Bladder surgery  4098,1191  . Sinus exploration  2000  . Tonsillectomy      AS A CHILD  . Esophagogastroduodenoscopy  12/2009    short segment Barrett's, small hh, chronic gastritis  . Nasal septum surgery    . Esophagogastroduodenoscopy  01/04/2011    YNW:GNFA gastritis/Barrett's, possible   No Known Allergies  Current Outpatient Prescriptions  Medication Sig Dispense Refill  . allopurinol (ZYLOPRIM) 100 MG tablet Take 100 mg by mouth daily.        Marland Kitchen atenolol (TENORMIN) 25 MG tablet Take 25 mg by mouth daily.        Marland Kitchen omeprazole (PRILOSEC OTC) 20 MG tablet Take 20 mg by mouth daily.        . temazepam (RESTORIL) 30 MG capsule Take 30 mg by mouth at bedtime as needed. For sleep (hardly ever takes)            Review of Systems     Objective:   Physical Exam  Vitals reviewed. Constitutional: He is oriented to person, place, and time. He appears well-nourished. No distress.  HENT:  Head:  Normocephalic and atraumatic.  Mouth/Throat: No oropharyngeal exudate.  Eyes: Pupils are equal, round, and reactive to light. No scleral icterus.  Neck: Normal range of motion. Neck supple.  Cardiovascular: Normal rate, regular rhythm and normal heart sounds.   Pulmonary/Chest: Effort normal and breath sounds normal. No respiratory distress.  Abdominal: Soft. Bowel sounds are normal. He exhibits no distension. There is no tenderness.  Musculoskeletal: He exhibits edema (TRACE BIL LE).  Lymphadenopathy:    He has no cervical adenopathy.  Neurological: He is alert and oriented to person, place, and time.  NO FOCAL DEFICITS   Psychiatric: He has a normal mood and affect.          Assessment & Plan:

## 2012-08-06 NOTE — Assessment & Plan Note (Signed)
SX NOT IDEALLY CONTROLLED ON ZANTAC.  CONTINUE WEIGHT LOSS EFFORTS. FOLLOW A LOW FAT DIET.  HO GIVEN. CONTINUE OMEPRAZOLE.  TAKE 30 MINUTES PRIOR TO BREAKFAST DAILY. OTC CHEAPER FOR PT. FOLLOW UP IN 2 YEARS.

## 2012-08-06 NOTE — Patient Instructions (Addendum)
CONTINUE YOUR WEIGHT LOSS EFFORTS.  FOLLOW A LOW FAT DIET. SEE INFO BELOW.  CONTINUE OMEPRAZOLE.  TAKE 30 MINUTES PRIOR TO BREAKFAST DAILY.  FOLLOW UP IN 2 YEARS.

## 2012-08-06 NOTE — Progress Notes (Signed)
CC PCP 

## 2012-08-06 NOTE — Assessment & Plan Note (Signed)
NEXT EGD 2016

## 2012-08-07 DIAGNOSIS — H00029 Hordeolum internum unspecified eye, unspecified eyelid: Secondary | ICD-10-CM | POA: Diagnosis not present

## 2012-08-11 NOTE — Progress Notes (Signed)
REMINDER APPTS MADE  

## 2012-08-14 DIAGNOSIS — M1710 Unilateral primary osteoarthritis, unspecified knee: Secondary | ICD-10-CM | POA: Diagnosis not present

## 2012-09-17 DIAGNOSIS — M545 Low back pain: Secondary | ICD-10-CM | POA: Diagnosis not present

## 2012-10-09 DIAGNOSIS — M47817 Spondylosis without myelopathy or radiculopathy, lumbosacral region: Secondary | ICD-10-CM | POA: Diagnosis not present

## 2012-10-09 DIAGNOSIS — IMO0002 Reserved for concepts with insufficient information to code with codable children: Secondary | ICD-10-CM | POA: Diagnosis not present

## 2012-10-21 DIAGNOSIS — G4700 Insomnia, unspecified: Secondary | ICD-10-CM | POA: Diagnosis not present

## 2012-10-21 DIAGNOSIS — Z23 Encounter for immunization: Secondary | ICD-10-CM | POA: Diagnosis not present

## 2012-10-21 DIAGNOSIS — I10 Essential (primary) hypertension: Secondary | ICD-10-CM | POA: Diagnosis not present

## 2012-10-21 DIAGNOSIS — Z6830 Body mass index (BMI) 30.0-30.9, adult: Secondary | ICD-10-CM | POA: Diagnosis not present

## 2012-10-21 DIAGNOSIS — M1A00X0 Idiopathic chronic gout, unspecified site, without tophus (tophi): Secondary | ICD-10-CM | POA: Diagnosis not present

## 2012-11-25 ENCOUNTER — Encounter (INDEPENDENT_AMBULATORY_CARE_PROVIDER_SITE_OTHER): Payer: Self-pay

## 2012-11-25 ENCOUNTER — Other Ambulatory Visit: Payer: Self-pay | Admitting: Gastroenterology

## 2012-11-25 ENCOUNTER — Encounter: Payer: Self-pay | Admitting: Gastroenterology

## 2012-11-25 ENCOUNTER — Ambulatory Visit (INDEPENDENT_AMBULATORY_CARE_PROVIDER_SITE_OTHER): Payer: Medicare Other | Admitting: Gastroenterology

## 2012-11-25 VITALS — BP 123/80 | HR 78 | Temp 97.4°F | Wt 220.8 lb

## 2012-11-25 DIAGNOSIS — R1310 Dysphagia, unspecified: Secondary | ICD-10-CM | POA: Insufficient documentation

## 2012-11-25 DIAGNOSIS — R634 Abnormal weight loss: Secondary | ICD-10-CM

## 2012-11-25 MED ORDER — LIDOCAINE VISCOUS 2 % MT SOLN: OROMUCOSAL | Status: DC

## 2012-11-25 NOTE — Assessment & Plan Note (Signed)
ASSOCIATED WITH WEIGHT LOSS. DIFFERENTIAL DIAGNOSIS INCLUDES HSV ESOPHAGITIS, EROSIVE REFLUX ESOPHAGITIS, AND LESS LIKELY ESO CA.  EGD/?DIL MON NOV 10 VISCOUS LIDOCAINE BEFORE MEALS TID SOFT MECH DIET OPV IN 4 MOS

## 2012-11-25 NOTE — Progress Notes (Signed)
  Subjective:    Patient ID: William Rivera, male    DOB: 11/12/1935, 77 y.o.   MRN: [id number]  GOLDING, JOHN CABOT, MD  HPI Seen in JUL 2014 DOING PRETTY GOOD. FOR PAST ONE MONTH BURNING AS IT GOES DOWN. STOPPED ETOH. NO TOBACCO. ASPIRIN: 2X/WK, NO BC/GOODY POWDERS, IBUPROFEN/MOTRIN, OR NAPROXEN/ALEVE. NOT TAKING ALLOPURINOL REGULARLY. LOST 3 LBS SINCE JUL. CAN'T EAT NOTHING. EVERYTHING BURNS GOING DOWN. A LITTLE HARD TO SWALLOW.  DOESN'T BUBBLE BACK UP. SX FOR ~2 WEEKS AND NOT GETTING BETTER. TRIED TO INCREASE OMEPRAZOLE BUT DIDN'T GET BETTER. RARE DIARRHEA. BM QD. BURNING PAIN IN UPPER ABD(EPIGASTRIUM). IF EATS GETS BETTER BUT DOESN'T TAKE LONG IT STARTS TO HURT AGAIN. PT DENIES FEVER, CHILLS, BRBPR, nausea, vomiting, melena, constipation, OR problems with sedation.  Past Medical History  Diagnosis Date  . Hypertension   . Gout   . Barrett's esophagus     last EGD/Bx 12/11  . Helicobacter pylori gastritis 2000    s/p treatment  . Adenomatous colon polyp 2010   Past Surgical History  Procedure Laterality Date  . Esophagogastroduodenoscopy  2008    DUODENITIS & GASTRITIS 2o TO NSAIDS/ETOH  . Colonoscopy  2010    simple adenomas  . Neck surgery      DISC REPLACED  . Bladder surgery  1978,1979  . Sinus exploration  2000  . Tonsillectomy      AS A CHILD  . Esophagogastroduodenoscopy  12/2009    short segment Barrett's, small hh, chronic gastritis  . Nasal septum surgery    . Esophagogastroduodenoscopy  01/04/2011    SLF:Mild gastritis/Barrett's, possible   No Known Allergies  Current Outpatient Prescriptions  Medication Sig Dispense Refill  . allopurinol (ZYLOPRIM) 100 MG tablet Take 100 mg by mouth daily.    PRN    . atenolol (TENORMIN) 25 MG tablet Take 25 mg by mouth daily.        . omeprazole (PRILOSEC OTC) 20 MG tablet Take 20 mg by mouth daily.        . temazepam (RESTORIL) 30 MG capsule Take 30 mg by mouth at bedtime as needed. For sleep (hardly ever takes)           Review of Systems     Objective:   Physical Exam  Vitals reviewed. Constitutional: He is oriented to person, place, and time. He appears well-nourished. No distress.  HENT:  Head: Normocephalic and atraumatic.  Mouth/Throat: Oropharynx is clear and moist. No oropharyngeal exudate.  Eyes: Pupils are equal, round, and reactive to light. No scleral icterus.  Neck: Normal range of motion. Neck supple.  Cardiovascular: Normal rate and normal heart sounds.   Pulmonary/Chest: Effort normal and breath sounds normal.  Abdominal: Soft. Bowel sounds are normal. He exhibits no distension. There is no tenderness.  Musculoskeletal: He exhibits no edema.  Lymphadenopathy:    He has no cervical adenopathy.  Neurological: He is alert and oriented to person, place, and time.  NO FOCAL DEFICITS   Psychiatric: He has a normal mood and affect.          Assessment & Plan:   

## 2012-11-25 NOTE — Patient Instructions (Signed)
UPPER ENODSCOPY WITH POSIIBLE DILATION NOV 10.  CONTINUE OMEPRAZOLE.  TAKE 30 MINUTES PRIOR TO YOUR MEALS TWICE DAILY.  USE VISCOUS LIDOCAINE 15 MINS BEFORE MEALS THREE TIMES A DAY  FOLLOW A SOFT MECHANICAL DIET. SEE INFO BELOW.  FOLLOW UP IN 4 MOS.   SOFT MECHANICAL DIET This SOFT MECHANICAL DIET is restricted to:  Foods that are moist, soft-textured, and easy to chew and swallow.   Meats that are ground or are minced no larger than one-quarter inch pieces. Meats are moist with gravy or sauce added.   Foods that do not include bread or bread-like textures except soft pancakes, well-moistened with syrup or sauce.   Textures with some chewing ability required.   Casseroles without rice.   Cooked vegetables that are less than half an inch in size and easily mashed with a fork. No cooked corn, peas, broccoli, cauliflower, cabbage, Brussels sprouts, asparagus, or other fibrous, non-tender or rubbery cooked vegetables.   Canned fruit except for pineapple. Fruit must be cut into pieces no larger than half an inch in size.   Foods that do not include nuts, seeds, coconut, or sticky textures.   FOOD TEXTURES FOR DYSPHAGIA DIET LEVEL 2 -SOFT MECHANICAL DIET (includes all foods on Dysphagia Diet Level 1 - Pureed, in addition to the foods listed below)  FOOD GROUP: Breads. RECOMMENDED: Soft pancakes, well-moistened with syrup or sauce.  AVOID: All others.  FOOD GROUP: Cereals.  RECOMMENDED: Cooked cereals with little texture, including oatmeal. Unprocessed wheat bran stirred into cereals for bulk. Note: If thin liquids are restricted, it is important that all of the liquid is absorbed into the cereal.  AVOID: All dry cereals and any cooked cereals that may contain flax seeds or other seeds or nuts. Whole-grain, dry, or coarse cereals. Cereals with nuts, seeds, dried fruit, and/or coconut.  FOOD GROUP: Desserts. RECOMMENDED: Pudding, custard. Soft fruit pies with bottom crust only.  Canned fruit (excluding pineapple). Soft, moist cakes with icing.Frozen malts, milk shakes, frozen yogurt, eggnog, nutritional supplements, ice cream, sherbet, regular or sugar-free gelatin, or any foods that become thin liquid at either room (70 F) or body temperature (98 F).  AVOID: Dry, coarse cakes and cookies. Anything with nuts, seeds, coconut, pineapple, or dried fruit. Breakfast yogurt with nuts. Rice or bread pudding.  FOOD GROUP: Fats. RECOMMENDED: Butter, margarine, cream for cereal (depending on liquid consistency recommendations), gravy, cream sauces, sour cream, sour cream dips with soft additives, mayonnaise, salad dressings, cream cheese, cream cheese spreads with soft additives, whipped toppings.  AVOID: All fats with coarse or chunky additives.  FOOD GROUP: Fruits. RECOMMENDED: Soft drained, canned, or cooked fruits without seeds or skin. Fresh soft and ripe banana. Fruit juices with a small amount of pulp. If thin liquids are restricted, fruit juices should be thickened to appropriate consistency.  AVOID: Fresh or frozen fruits. Cooked fruit with skin or seeds. Dried fruits. Fresh, canned, or cooked pineapple.  FOOD GROUP: Meats and Meat Substitutes. (Meat pieces should not exceed 1/4 of an inch cube and should be tender.) RECOMMENDED: Moistened ground or cooked meat, poultry, or fish. Moist ground or tender meat may be served with gravy or sauce. Casseroles without rice. Moist macaroni and cheese, well-cooked pasta with meat sauce, tuna noodle casserole, soft, moist lasagna. Moist meatballs, meatloaf, or fish loaf. Protein salads, such as tuna or egg without large chunks, celery, or onion. Cottage cheese, smooth quiche without large chunks. Poached, scrambled, or soft-cooked eggs (egg yolks should not be "runny" but  should be moist and able to be mashed with butter, margarine, or other moisture added to them). (Cook eggs to 160 F or use pasteurized eggs for safety.) Souffls  may have small, soft chunks. Tofu. Well-cooked, slightly mashed, moist legumes, such as baked beans. All meats or protein substitutes should be served with sauces or moistened to help maintain cohesiveness in the oral cavity.  AVOID: Dry meats, tough meats (such as bacon, sausage, hot dogs, bratwurst). Dry casseroles or casseroles with rice or large chunks. Peanut butter. Cheese slices and cubes. Hard-cooked or crisp fried eggs. Sandwiches.Pizza.  FOOD GROUP: Potatoes and Starches. RECOMMENDED: Well-cooked, moistened, boiled, baked, or mashed potatoes. Well-cooked shredded hash brown potatoes that are not crisp. (All potatoes need to be moist and in sauces.)Well-cooked noodles in sauce. Spaetzel or soft dumplings that have been moistened with butter or gravy.  AVOID: Potato skins and chips. Fried or French-fried potatoes. Rice.  FOOD GROUP: Soups. RECOMMENDED: Soups with easy-to-chew or easy-to-swallow meats or vegetables: Particle sizes in soups should be less than 1/2 inch. Soups will need to be thickened to appropriate consistency if soup is thinner than prescribed liquid consistency.  AVOID: Soups with large chunks of meat and vegetables. Soups with rice, corn, peas.  FOOD GROUP: Vegetables. RECOMMENDED: All soft, well-cooked vegetables. Vegetables should be less than a half inch. Should be easily mashed with a fork.  AVOID: Cooked corn and peas. Broccoli, cabbage, Brussels sprouts, asparagus, or other fibrous, non-tender or rubbery cooked vegetables.  FOOD GROUP: Miscellaneous. RECOMMENDED: Jams and preserves without seeds, jelly. Sauces, salsas, etc., that may have small tender chunks less than 1/2 inch. Soft, smooth chocolate bars that are easily chewed.  AVOID: Seeds, nuts, coconut, or sticky foods. Chewy candies such as caramels or licorice.

## 2012-11-30 ENCOUNTER — Encounter (HOSPITAL_COMMUNITY): Payer: Self-pay | Admitting: *Deleted

## 2012-11-30 ENCOUNTER — Ambulatory Visit (HOSPITAL_COMMUNITY)
Admission: RE | Admit: 2012-11-30 | Discharge: 2012-11-30 | Disposition: A | Discharge status: Home / Self Care | Payer: Medicare Other | Source: Ambulatory Visit | Attending: Gastroenterology | Admitting: Gastroenterology

## 2012-11-30 ENCOUNTER — Encounter (HOSPITAL_COMMUNITY)
Admission: RE | Disposition: A | Discharge status: Home / Self Care | Payer: Self-pay | Source: Ambulatory Visit | Attending: Gastroenterology

## 2012-11-30 DIAGNOSIS — K2990 Gastroduodenitis, unspecified, without bleeding: Secondary | ICD-10-CM

## 2012-11-30 DIAGNOSIS — R634 Abnormal weight loss: Secondary | ICD-10-CM

## 2012-11-30 DIAGNOSIS — K3189 Other diseases of stomach and duodenum: Secondary | ICD-10-CM | POA: Diagnosis not present

## 2012-11-30 DIAGNOSIS — K2940 Chronic atrophic gastritis without bleeding: Secondary | ICD-10-CM | POA: Insufficient documentation

## 2012-11-30 DIAGNOSIS — R1013 Epigastric pain: Secondary | ICD-10-CM

## 2012-11-30 DIAGNOSIS — R1310 Dysphagia, unspecified: Secondary | ICD-10-CM | POA: Insufficient documentation

## 2012-11-30 DIAGNOSIS — R079 Chest pain, unspecified: Secondary | ICD-10-CM

## 2012-11-30 DIAGNOSIS — K2970 Gastritis, unspecified, without bleeding: Secondary | ICD-10-CM | POA: Diagnosis not present

## 2012-11-30 HISTORY — PX: ESOPHAGOGASTRODUODENOSCOPY: SHX5428

## 2012-11-30 HISTORY — PX: MALONEY DILATION: SHX5535

## 2012-11-30 HISTORY — PX: SAVORY DILATION: SHX5439

## 2012-11-30 SURGERY — EGD (ESOPHAGOGASTRODUODENOSCOPY)
Anesthesia: Moderate Sedation

## 2012-11-30 MED ORDER — BUTAMBEN-TETRACAINE-BENZOCAINE 2-2-14 % EX AERO
INHALATION_SPRAY | CUTANEOUS | Status: DC | PRN
  Administered 2012-11-30: 2 via TOPICAL

## 2012-11-30 MED ORDER — SODIUM CHLORIDE 0.9 % IV SOLN
INTRAVENOUS | Status: DC
  Administered 2012-11-30: 12:00:00 via INTRAVENOUS

## 2012-11-30 MED ORDER — STERILE WATER FOR IRRIGATION IR SOLN
Status: DC | PRN
  Administered 2012-11-30: 13:00:00

## 2012-11-30 MED ORDER — OMEPRAZOLE 20 MG PO CPDR: DELAYED_RELEASE_CAPSULE | ORAL | Status: DC

## 2012-11-30 MED ORDER — MEPERIDINE HCL 100 MG/ML IJ SOLN
INTRAMUSCULAR | Status: AC
  Filled 2012-11-30: qty 2

## 2012-11-30 MED ORDER — MINERAL OIL PO OIL
TOPICAL_OIL | ORAL | Status: AC
  Filled 2012-11-30: qty 30

## 2012-11-30 MED ORDER — MIDAZOLAM HCL 5 MG/5ML IJ SOLN
INTRAMUSCULAR | Status: DC | PRN
  Administered 2012-11-30 (×2): 2 mg via INTRAVENOUS

## 2012-11-30 MED ORDER — MEPERIDINE HCL 100 MG/ML IJ SOLN
INTRAMUSCULAR | Status: DC | PRN
  Administered 2012-11-30: 25 mg via INTRAVENOUS
  Administered 2012-11-30: 50 mg via INTRAVENOUS

## 2012-11-30 MED ORDER — MIDAZOLAM HCL 5 MG/5ML IJ SOLN
INTRAMUSCULAR | Status: AC
  Filled 2012-11-30: qty 10

## 2012-11-30 MED ORDER — NORTRIPTYLINE HCL 25 MG PO CAPS: ORAL_CAPSULE | ORAL | Status: DC

## 2012-11-30 NOTE — Op Note (Signed)
[facility] [address] [place], [address]   ENDOSCOPY PROCEDURE REPORT  PATIENT: William Rivera, William Rivera  MR#: [id number] BIRTHDATE: July 04, 1935 , 77  yrs. old GENDER: Male  ENDOSCOPIST: Jonette Eva, MD REFFERED NG:EXBM Phillips Odor, M.D.  PROCEDURE DATE:  11/30/2012 PROCEDURE:   EGD with biopsy and EGD with dilatation over guidewire   INDICATIONS:1.  dysphagia.   2.  dyspepsia.   3.  chest pain. MEDICATIONS: Demerol 75 mg IV and Versed 4 mg IV TOPICAL ANESTHETIC: Cetacaine Spray  DESCRIPTION OF PROCEDURE:   After the risks benefits and alternatives of the procedure were thoroughly explained, informed consent was obtained.  The EG-[id number] ([id number])  endoscope was introduced through the mouth and advanced to the second portion of the duodenum. The instrument was slowly withdrawn as the mucosa was carefully examined.  Prior to withdrawal of the scope, the guidwire was placed.  The esophagus was dilated successfully.  The patient was recovered in endoscopy and discharged home in satisfactory condition.   ESOPHAGUS: The mucosa of the esophagus appeared normal.  EMPIRIC DILATION DUE TO C/O DYSPHAGIA/?CERVICAL WEB.  STOMACH: Mild non-erosive gastritis (inflammation) was found in the gastric antrum.  Multiple biopsies were performed.   DUODENUM: The duodenal mucosa showed no abnormalities in the bulb and second portion of the duodenum.   Dilation was then performed at the gastroesphageal junction Dilator: Savary over guidewire Size(s): 15-16 mm Resistance: minimal Heme: yes  COMPLICATIONS: There were no complications.  ENDOSCOPIC IMPRESSION: 1.   MILD Non-erosive gastritis  RECOMMENDATIONS: CALL ME IN 2 WEEKS IF YOUR SYMPTOMS ARE NOT BETTER. PT DID NOT WANT TO TAKETRAZODONE DUE TO RISK OF WEIGHT GAIN.  Start PAMELOR FOR SLEEP.  PT SHOULD GET REFILLS FROM HIS PCP. Use Prilosec 30 minutes prior to your meals twice daily. FOLLOW A LOW FAT DIET. BIOPSY WILL BE BACK IN 7  DAYS.  FOLLOW UP IN 3 MOS.      _______________________________ Rosalie DoctorJonette Eva, MD 11/30/2012 3:10 PM

## 2012-11-30 NOTE — H&P (View-Only) (Signed)
  Subjective:    Patient ID: William Rivera, male    DOB: [birth year], 77 y.o.   MRN: [id number]  [name], MD  HPI Seen in JUL 2014 DOING PRETTY GOOD. FOR PAST ONE MONTH BURNING AS IT GOES DOWN. STOPPED ETOH. NO TOBACCO. ASPIRIN: 2X/WK, NO BC/GOODY POWDERS, IBUPROFEN/MOTRIN, OR NAPROXEN/ALEVE. NOT TAKING ALLOPURINOL REGULARLY. LOST 3 LBS SINCE JUL. CAN'T EAT NOTHING. EVERYTHING BURNS GOING DOWN. A LITTLE HARD TO SWALLOW.  DOESN'T BUBBLE BACK UP. SX FOR ~2 WEEKS AND NOT GETTING BETTER. TRIED TO INCREASE OMEPRAZOLE BUT DIDN'T GET BETTER. RARE DIARRHEA. BM QD. BURNING PAIN IN UPPER ABD(EPIGASTRIUM). IF EATS GETS BETTER BUT DOESN'T TAKE LONG IT STARTS TO HURT AGAIN. PT DENIES FEVER, CHILLS, BRBPR, nausea, vomiting, melena, constipation, OR problems with sedation.  Past Medical History  Diagnosis Date  . Hypertension   . Gout   . Barrett's esophagus     last EGD/Bx 12/11  . Helicobacter pylori gastritis 2000    s/p treatment  . Adenomatous colon polyp 2010   Past Surgical History  Procedure Laterality Date  . Esophagogastroduodenoscopy  2008    DUODENITIS & GASTRITIS 2o TO NSAIDS/ETOH  . Colonoscopy  2010    simple adenomas  . Neck surgery      DISC REPLACED  . Bladder surgery  4098,1191  . Sinus exploration  2000  . Tonsillectomy      AS A CHILD  . Esophagogastroduodenoscopy  12/2009    short segment Barrett's, small hh, chronic gastritis  . Nasal septum surgery    . Esophagogastroduodenoscopy  01/04/2011    YNW:GNFA gastritis/Barrett's, possible   No Known Allergies  Current Outpatient Prescriptions  Medication Sig Dispense Refill  . allopurinol (ZYLOPRIM) 100 MG tablet Take 100 mg by mouth daily.    PRN    . atenolol (TENORMIN) 25 MG tablet Take 25 mg by mouth daily.        Marland Kitchen omeprazole (PRILOSEC OTC) 20 MG tablet Take 20 mg by mouth daily.        . temazepam (RESTORIL) 30 MG capsule Take 30 mg by mouth at bedtime as needed. For sleep (hardly ever takes)           Review of Systems     Objective:   Physical Exam  Vitals reviewed. Constitutional: He is oriented to person, place, and time. He appears well-nourished. No distress.  HENT:  Head: Normocephalic and atraumatic.  Mouth/Throat: Oropharynx is clear and moist. No oropharyngeal exudate.  Eyes: Pupils are equal, round, and reactive to light. No scleral icterus.  Neck: Normal range of motion. Neck supple.  Cardiovascular: Normal rate and normal heart sounds.   Pulmonary/Chest: Effort normal and breath sounds normal.  Abdominal: Soft. Bowel sounds are normal. He exhibits no distension. There is no tenderness.  Musculoskeletal: He exhibits no edema.  Lymphadenopathy:    He has no cervical adenopathy.  Neurological: He is alert and oriented to person, place, and time.  NO FOCAL DEFICITS   Psychiatric: He has a normal mood and affect.          Assessment & Plan:

## 2012-11-30 NOTE — Progress Notes (Signed)
cc'd to pcp 

## 2012-11-30 NOTE — Interval H&P Note (Signed)
History and Physical Interval Note:  11/30/2012 1:07 PM  William Rivera  has presented today for surgery, with the diagnosis of Odynophagia and weight loss  The various methods of treatment have been discussed with the patient and family. After consideration of risks, benefits and other options for treatment, the patient has consented to  Procedure(s) with comments: ESOPHAGOGASTRODUODENOSCOPY (EGD) (N/A) - 2:00 SAVORY DILATION (N/A) MALONEY DILATION (N/A) as a surgical intervention .  The patient's history has been reviewed, patient examined, no change in status, stable for surgery.  I have reviewed the patient's chart and labs.  Questions were answered to the patient's satisfaction.     [employer]

## 2012-12-01 DIAGNOSIS — Z0000 Encounter for general adult medical examination without abnormal findings: Secondary | ICD-10-CM | POA: Diagnosis not present

## 2012-12-01 DIAGNOSIS — I10 Essential (primary) hypertension: Secondary | ICD-10-CM | POA: Diagnosis not present

## 2012-12-01 DIAGNOSIS — Z6829 Body mass index (BMI) 29.0-29.9, adult: Secondary | ICD-10-CM | POA: Diagnosis not present

## 2012-12-01 DIAGNOSIS — E785 Hyperlipidemia, unspecified: Secondary | ICD-10-CM | POA: Diagnosis not present

## 2012-12-03 ENCOUNTER — Encounter (HOSPITAL_COMMUNITY): Payer: Self-pay | Admitting: Gastroenterology

## 2012-12-05 DIAGNOSIS — Z6829 Body mass index (BMI) 29.0-29.9, adult: Secondary | ICD-10-CM | POA: Diagnosis not present

## 2012-12-05 DIAGNOSIS — L723 Sebaceous cyst: Secondary | ICD-10-CM | POA: Diagnosis not present

## 2012-12-05 DIAGNOSIS — L0291 Cutaneous abscess, unspecified: Secondary | ICD-10-CM | POA: Diagnosis not present

## 2012-12-05 DIAGNOSIS — I10 Essential (primary) hypertension: Secondary | ICD-10-CM | POA: Diagnosis not present

## 2012-12-09 ENCOUNTER — Telehealth: Payer: Self-pay | Admitting: Gastroenterology

## 2012-12-09 NOTE — Telephone Encounter (Signed)
Please call pt. His stomach Bx shows gastritis.   Use Prilosec 30 minutes prior to your first meal. Low fat diet PAMELOR FOR SLEEP [facility] FEB 2015 odynophagia e15

## 2012-12-10 NOTE — Telephone Encounter (Signed)
Reminder in epic °

## 2012-12-10 NOTE — Telephone Encounter (Signed)
Called, busy signal

## 2012-12-10 NOTE — Telephone Encounter (Signed)
Results Cc to PCP  

## 2012-12-14 NOTE — Telephone Encounter (Signed)
LMOM to call.

## 2012-12-15 NOTE — Telephone Encounter (Signed)
Pt returned call and was informed.  

## 2012-12-24 DIAGNOSIS — Z85828 Personal history of other malignant neoplasm of skin: Secondary | ICD-10-CM | POA: Diagnosis not present

## 2012-12-24 DIAGNOSIS — L570 Actinic keratosis: Secondary | ICD-10-CM | POA: Diagnosis not present

## 2012-12-24 DIAGNOSIS — L820 Inflamed seborrheic keratosis: Secondary | ICD-10-CM | POA: Diagnosis not present

## 2012-12-24 DIAGNOSIS — D235 Other benign neoplasm of skin of trunk: Secondary | ICD-10-CM | POA: Diagnosis not present

## 2013-02-24 DIAGNOSIS — H43399 Other vitreous opacities, unspecified eye: Secondary | ICD-10-CM | POA: Diagnosis not present

## 2013-02-24 DIAGNOSIS — H04129 Dry eye syndrome of unspecified lacrimal gland: Secondary | ICD-10-CM | POA: Diagnosis not present

## 2013-02-24 DIAGNOSIS — H5710 Ocular pain, unspecified eye: Secondary | ICD-10-CM | POA: Diagnosis not present

## 2013-05-03 DIAGNOSIS — N183 Chronic kidney disease, stage 3 unspecified: Secondary | ICD-10-CM | POA: Diagnosis not present

## 2013-05-03 DIAGNOSIS — Z6830 Body mass index (BMI) 30.0-30.9, adult: Secondary | ICD-10-CM | POA: Diagnosis not present

## 2013-05-03 DIAGNOSIS — M1990 Unspecified osteoarthritis, unspecified site: Secondary | ICD-10-CM | POA: Diagnosis not present

## 2013-06-10 ENCOUNTER — Other Ambulatory Visit (HOSPITAL_COMMUNITY): Payer: Self-pay | Admitting: Family Medicine

## 2013-06-10 DIAGNOSIS — R109 Unspecified abdominal pain: Secondary | ICD-10-CM

## 2013-06-10 DIAGNOSIS — R1900 Intra-abdominal and pelvic swelling, mass and lump, unspecified site: Secondary | ICD-10-CM | POA: Diagnosis not present

## 2013-06-10 DIAGNOSIS — Z6829 Body mass index (BMI) 29.0-29.9, adult: Secondary | ICD-10-CM | POA: Diagnosis not present

## 2013-06-15 ENCOUNTER — Ambulatory Visit (HOSPITAL_COMMUNITY)
Admission: RE | Admit: 2013-06-15 | Discharge: 2013-06-15 | Disposition: A | Discharge status: Home / Self Care | Payer: Medicare Other | Source: Ambulatory Visit | Attending: Family Medicine | Admitting: Family Medicine

## 2013-06-15 DIAGNOSIS — R109 Unspecified abdominal pain: Secondary | ICD-10-CM

## 2013-06-15 DIAGNOSIS — Q619 Cystic kidney disease, unspecified: Secondary | ICD-10-CM | POA: Insufficient documentation

## 2013-06-15 DIAGNOSIS — K7689 Other specified diseases of liver: Secondary | ICD-10-CM | POA: Insufficient documentation

## 2013-06-15 DIAGNOSIS — N269 Renal sclerosis, unspecified: Secondary | ICD-10-CM | POA: Insufficient documentation

## 2013-07-05 DIAGNOSIS — H43399 Other vitreous opacities, unspecified eye: Secondary | ICD-10-CM | POA: Diagnosis not present

## 2013-07-05 DIAGNOSIS — H40019 Open angle with borderline findings, low risk, unspecified eye: Secondary | ICD-10-CM | POA: Diagnosis not present

## 2013-07-05 DIAGNOSIS — H04129 Dry eye syndrome of unspecified lacrimal gland: Secondary | ICD-10-CM | POA: Diagnosis not present

## 2013-07-05 DIAGNOSIS — H11009 Unspecified pterygium of unspecified eye: Secondary | ICD-10-CM | POA: Diagnosis not present

## 2013-07-13 ENCOUNTER — Encounter: Payer: Self-pay | Admitting: Gastroenterology

## 2013-07-13 ENCOUNTER — Ambulatory Visit (INDEPENDENT_AMBULATORY_CARE_PROVIDER_SITE_OTHER): Payer: Medicare Other | Admitting: Gastroenterology

## 2013-07-13 ENCOUNTER — Encounter (INDEPENDENT_AMBULATORY_CARE_PROVIDER_SITE_OTHER): Payer: Self-pay

## 2013-07-13 VITALS — BP 124/82 | HR 69 | Temp 97.3°F | Resp 20 | Ht 69.0 in | Wt 218.0 lb

## 2013-07-13 DIAGNOSIS — D126 Benign neoplasm of colon, unspecified: Secondary | ICD-10-CM

## 2013-07-13 DIAGNOSIS — R1013 Epigastric pain: Secondary | ICD-10-CM

## 2013-07-13 DIAGNOSIS — F1010 Alcohol abuse, uncomplicated: Secondary | ICD-10-CM

## 2013-07-13 DIAGNOSIS — K219 Gastro-esophageal reflux disease without esophagitis: Secondary | ICD-10-CM

## 2013-07-13 LAB — CBC WITH DIFFERENTIAL/PLATELET
Basophils Absolute: 0.1 10*3/uL (ref 0.0–0.1)
Basophils Relative: 1 % (ref 0–1)
Eosinophils Absolute: 0.1 10*3/uL (ref 0.0–0.7)
Eosinophils Relative: 2 % (ref 0–5)
HCT: 42.7 % (ref 39.0–52.0)
Hemoglobin: 15.2 g/dL (ref 13.0–17.0)
LYMPHS PCT: 32 % (ref 12–46)
Lymphs Abs: 2.1 10*3/uL (ref 0.7–4.0)
MCH: 34.2 pg — ABNORMAL HIGH (ref 26.0–34.0)
MCHC: 35.6 g/dL (ref 30.0–36.0)
MCV: 96.2 fL (ref 78.0–100.0)
Monocytes Absolute: 0.7 10*3/uL (ref 0.1–1.0)
Monocytes Relative: 11 % (ref 3–12)
NEUTROS ABS: 3.6 10*3/uL (ref 1.7–7.7)
Neutrophils Relative %: 54 % (ref 43–77)
Platelets: 201 10*3/uL (ref 150–400)
RBC: 4.44 MIL/uL (ref 4.22–5.81)
RDW: 14.2 % (ref 11.5–15.5)
WBC: 6.7 10*3/uL (ref 4.0–10.5)

## 2013-07-13 LAB — HEPATIC FUNCTION PANEL
ALT: 24 U/L (ref 0–53)
AST: 22 U/L (ref 0–37)
Albumin: 4.3 g/dL (ref 3.5–5.2)
Alkaline Phosphatase: 62 U/L (ref 39–117)
BILIRUBIN INDIRECT: 0.5 mg/dL (ref 0.2–1.2)
Bilirubin, Direct: 0.1 mg/dL (ref 0.0–0.3)
Total Bilirubin: 0.6 mg/dL (ref 0.2–1.2)
Total Protein: 6.8 g/dL (ref 6.0–8.3)

## 2013-07-13 LAB — CREATININE, SERUM: Creat: 1.5 mg/dL — ABNORMAL HIGH (ref 0.50–1.35)

## 2013-07-13 LAB — LIPASE: Lipase: 32 U/L (ref 0–75)

## 2013-07-13 MED ORDER — PEG 3350-KCL-NA BICARB-NACL 420 G PO SOLR: 4000.0000 mL | ORAL | Status: DC

## 2013-07-13 MED ORDER — SUCRALFATE 1 GM/10ML PO SUSP: 1.0000 g | Freq: Four times a day (QID) | ORAL | Status: DC

## 2013-07-13 NOTE — Patient Instructions (Signed)
1. Please have your labs done. 2. Start carafate for burning with meals. RX sent to your pharmacy. 3. Colonoscopy as scheduled. 4. Congratulations on working towards alcohol avoidance.

## 2013-07-13 NOTE — Progress Notes (Signed)
Primary Care Physician:  [name], MD  Primary Gastroenterologist:  Barney Drain, MD   Chief Complaint  Patient presents with  . Abdominal Pain  . Heartburn    HPI:  William Rivera is a 78 y.o. male here for follow up. He has h/o burning in the esophagus/epigastric area with meals. Last EGD 11/2012 with mild gastritis. no etoh for 1-2 months. Patient states his symptoms started back after the started drinking again. Now no etoh for 2 weeks and burning has not resolved. Omeprazole BID and Zantac prn. Some nausea. Hurts no matter what he eats. BM regular. Everyday. No melena, brbpr. No NSAIDS/ASA. Weight down a couple of pounds since 11/2012. He is due for a colonoscopy for h/o adenomatous colon polyps. Recently felt like right abd swollen. Abd u/s as outlined below.  Current Outpatient Prescriptions  Medication Sig Dispense Refill  . allopurinol (ZYLOPRIM) 100 MG tablet Take 100 mg by mouth daily.        Marland Kitchen atenolol (TENORMIN) 25 MG tablet Take 25 mg by mouth daily.        Marland Kitchen omeprazole (PRILOSEC) 20 MG capsule 1 po 30 mins prior to meals bid  60 capsule  11  . ranitidine (ZANTAC 150 MAXIMUM STRENGTH) 150 MG tablet Take 150 mg by mouth as needed for heartburn.       No current facility-administered medications for this visit.    Allergies as of 07/13/2013  . (No Known Allergies)    Past Medical History  Diagnosis Date  . Hypertension   . Gout   . Barrett's esophagus     last EGD/Bx 12/11  . Helicobacter pylori gastritis 2000    s/p treatment  . Adenomatous colon polyp 2010    Past Surgical History  Procedure Laterality Date  . Esophagogastroduodenoscopy  2008    DUODENITIS & GASTRITIS 2o TO NSAIDS/ETOH  . Colonoscopy  2010    simple adenomas  . Neck surgery      DISC REPLACED  . Bladder surgery  [id number]  . Sinus exploration  2000  . Tonsillectomy      AS A CHILD  . Esophagogastroduodenoscopy  12/2009    short segment Barrett's, small hh, chronic gastritis   . Nasal septum surgery    . Esophagogastroduodenoscopy  01/04/2011    UUE:[facility] gastritis/Barrett's, possible  . Esophagogastroduodenoscopy N/A 11/30/2012    Dr. Oneida Alar- normal esophagus, empiric dilation d/t c/o dysphagia/?cervical web, stomach= mild non erosive gastritis, inflammation on bx, duodenum= no abnormalities in the bulb and second portion of the duodenum. dilation at the gastroesphageal junction.  Azzie Almas dilation N/A 11/30/2012    Procedure: SAVORY DILATION;  Surgeon: Danie Binder, MD;  Location: [facility];  Service: Endoscopy;  Laterality: N/A;  Venia Minks dilation N/A 11/30/2012    Procedure: Venia Minks DILATION;  Surgeon: Danie Binder, MD;  Location: [facility];  Service: Endoscopy;  Laterality: N/A;    Family History  Problem Relation Age of Onset  . Stomach cancer Father 5    deceased  . Colon cancer Neg Hx     History   Social History  . Marital Status: Married    Spouse Name: N/A    Number of Children: 1  . Years of Education: N/A   Occupational History  . truck driver    Social History Main Topics  . Smoking status: Former Research scientist (life sciences)  . Smokeless tobacco: Never Used     Comment: quit age 61  . Alcohol Use: 0.0  oz/week    .01 drink(s) per week     Comment:  Was drinking 1 pint of bourbon each weekend. Admits to occasional weekends.  . Drug Use: No  . Sexual Activity: Yes    Partners: Female    Birth Control/ Protection: None   Other Topics Concern  . Not on file   Social History Narrative  . No narrative on file      ROS:  General: Negative for anorexia, weight loss, fever, chills, fatigue, weakness. Eyes: Negative for vision changes.  ENT: Negative for hoarseness, difficulty swallowing , nasal congestion. CV: Negative for chest pain, angina, palpitations, dyspnea on exertion, peripheral edema.  Respiratory: Negative for dyspnea at rest, dyspnea on exertion, cough, sputum, wheezing.  GI: See history of present illness. GU:  Negative  for dysuria, hematuria, urinary incontinence, urinary frequency, nocturnal urination.  MS: Negative for joint pain, low back pain.  Derm: Negative for rash or itching.  Neuro: Negative for weakness, abnormal sensation, seizure, frequent headaches, memory loss, confusion.  Psych: Negative for anxiety, depression, suicidal ideation, hallucinations.  Endo: Negative for unusual weight change.  Heme: Negative for bruising or bleeding. Allergy: Negative for rash or hives.    Physical Examination:  BP 124/82  Pulse 69  Temp(Src) 97.3 F (36.3 C) (Oral)  Resp 20  Ht 5\' 9"  (1.753 m)  Wt 218 lb (98.884 kg)  BMI 32.18 kg/m2   General: Well-nourished, well-developed in no acute distress.  Head: Normocephalic, atraumatic.   Eyes: Conjunctiva pink, no icterus. Mouth: Oropharyngeal mucosa moist and pink , no lesions erythema or exudate. Neck: Supple without thyromegaly, masses, or lymphadenopathy.  Lungs: Clear to auscultation bilaterally.  Heart: Regular rate and rhythm, no murmurs rubs or gallops.  Abdomen: Bowel sounds are normal, mild epig tenderness, nondistended, no hepatosplenomegaly or masses, no abdominal bruits or    hernia , no rebound or guarding.   Rectal: deferred Extremities: No lower extremity edema. No clubbing or deformities.  Neuro: Alert and oriented x 4 , grossly normal neurologically.  Skin: Warm and dry, no rash or jaundice.   Psych: Alert and cooperative, normal mood and affect.  Labs: Labs from 05/03/2013 potassium 4.7, sodium 140, glucose 106, BUN 16, creatinine 1.42, total bilirubin 0.7, alkaline phosphatase 63, AST 22, ALT 21, albumin 4.2  Imaging Studies: US Abdomen Complete  06/15/2013   CLINICAL DATA:  Right-sided abdominal fullness.  Hernia.  EXAM: ULTRASOUND ABDOMEN COMPLETE  COMPARISON:  Cancer scratches that 01/12/2010.  MR 10/03/2008  FINDINGS: Gallbladder:  No gallstones or wall thickening visualized. No sonographic Murphy sign noted.  Common bile duct:   Diameter: Normal at 3 mm.  Liver:  Tiny cyst is present adjacent to the gallbladder fossa measuring 10 mm x 10 mm x 9 mm. This is probably stable comparing images on today's examination to the prior examinations. No intrahepatic biliary ductal dilation. No mass lesion.  IVC:  No abnormality visualized.  Pancreas:  Visualized portion unremarkable.  Spleen:  11.3 cm.  Normal echotexture.  Right Kidney:  Length: 9.8 cm. Central sinus echo appear screws that central sinus echo complex appears normal. There is a 15 mm x 15 mm by 15 mm upper pole renal cyst which appears little changed compared to the prior MRI if 2010.  Left Kidney:  Length: 8 cm. Mildly hyperechoic with renal parenchymal scarring. Comparing to prior MRI, no interval change.  Abdominal aorta:  No aneurysm visualized.  Other findings:  None.  IMPRESSION: 1. No cause for right-sided fullness  identified. Normal appearance of the gallbladder and common bile duct. 2. Hepatic cyst near the gallbladder fossa measuring 1 cm is unchanged compared to prior MRI. Right upper pole renal cyst also appears unchanged. 3. Chronic left renal atrophy and renal parenchymal scarring.   Electronically Signed   By: Dereck Ligas M.D.   On: 06/15/2013 09:10

## 2013-07-15 ENCOUNTER — Encounter: Payer: Self-pay | Admitting: Gastroenterology

## 2013-07-15 NOTE — Assessment & Plan Note (Addendum)
Recurrent epigastric burning with meals since etoh use again. Symptoms have not settled down with stopping etoh two weeks ago. Ddx: esophagitis, pancreatitis, gastritis. Recent EGD 6 months ago. Trial of Carafate. Continue PPI. Continue etoh abstinence. Check labs.   He is due for surveillance colonoscopy at this time.  I have discussed the risks, alternatives, benefits with regards to but not limited to the risk of reaction to medication, bleeding, infection, perforation and the patient is agreeable to proceed. Written consent to be obtained. Previously has done fine with conscious sedation.

## 2013-07-15 NOTE — Progress Notes (Signed)
REVIEWED. AGREE. Pt should avoid etoh. EGD UTD.

## 2013-07-15 NOTE — Progress Notes (Signed)
cc'd to pcp 

## 2013-07-16 ENCOUNTER — Encounter (HOSPITAL_COMMUNITY): Payer: Self-pay | Admitting: Pharmacy Technician

## 2013-07-16 NOTE — Progress Notes (Signed)
Quick Note:  Labs look good except creatinine a little elevated.  He can follow up with pcp for elevated creatinine, send them copy of results.  TCS as planned. ______

## 2013-07-21 NOTE — Progress Notes (Signed)
Quick Note:  Called and informed pt. Please forward the lab to PCP. ______

## 2013-07-21 NOTE — Progress Notes (Signed)
Labs faxed to PCP

## 2013-07-28 ENCOUNTER — Encounter (HOSPITAL_COMMUNITY): Payer: Self-pay | Admitting: *Deleted

## 2013-07-28 ENCOUNTER — Encounter (HOSPITAL_COMMUNITY)
Admission: RE | Disposition: A | Discharge status: Home / Self Care | Payer: Self-pay | Source: Ambulatory Visit | Attending: Gastroenterology

## 2013-07-28 ENCOUNTER — Ambulatory Visit (HOSPITAL_COMMUNITY)
Admission: RE | Admit: 2013-07-28 | Discharge: 2013-07-28 | Disposition: A | Discharge status: Home / Self Care | Payer: Medicare Other | Source: Ambulatory Visit | Attending: Gastroenterology | Admitting: Gastroenterology

## 2013-07-28 DIAGNOSIS — Z79899 Other long term (current) drug therapy: Secondary | ICD-10-CM | POA: Insufficient documentation

## 2013-07-28 DIAGNOSIS — D126 Benign neoplasm of colon, unspecified: Secondary | ICD-10-CM

## 2013-07-28 DIAGNOSIS — K648 Other hemorrhoids: Secondary | ICD-10-CM | POA: Diagnosis not present

## 2013-07-28 DIAGNOSIS — I10 Essential (primary) hypertension: Secondary | ICD-10-CM | POA: Diagnosis not present

## 2013-07-28 DIAGNOSIS — Z1211 Encounter for screening for malignant neoplasm of colon: Secondary | ICD-10-CM | POA: Insufficient documentation

## 2013-07-28 DIAGNOSIS — K219 Gastro-esophageal reflux disease without esophagitis: Secondary | ICD-10-CM

## 2013-07-28 DIAGNOSIS — R1013 Epigastric pain: Secondary | ICD-10-CM

## 2013-07-28 DIAGNOSIS — Z7982 Long term (current) use of aspirin: Secondary | ICD-10-CM | POA: Diagnosis not present

## 2013-07-28 DIAGNOSIS — Z87891 Personal history of nicotine dependence: Secondary | ICD-10-CM | POA: Diagnosis not present

## 2013-07-28 DIAGNOSIS — K5730 Diverticulosis of large intestine without perforation or abscess without bleeding: Secondary | ICD-10-CM | POA: Insufficient documentation

## 2013-07-28 DIAGNOSIS — Z800 Family history of malignant neoplasm of digestive organs: Secondary | ICD-10-CM | POA: Diagnosis not present

## 2013-07-28 DIAGNOSIS — F1010 Alcohol abuse, uncomplicated: Secondary | ICD-10-CM

## 2013-07-28 HISTORY — PX: COLONOSCOPY: SHX5424

## 2013-07-28 HISTORY — DX: Gastro-esophageal reflux disease without esophagitis: K21.9

## 2013-07-28 SURGERY — COLONOSCOPY
Anesthesia: Moderate Sedation

## 2013-07-28 MED ORDER — MIDAZOLAM HCL 5 MG/5ML IJ SOLN
INTRAMUSCULAR | Status: AC
  Filled 2013-07-28: qty 10

## 2013-07-28 MED ORDER — MIDAZOLAM HCL 5 MG/5ML IJ SOLN
INTRAMUSCULAR | Status: DC | PRN
  Administered 2013-07-28 (×3): 2 mg via INTRAVENOUS

## 2013-07-28 MED ORDER — MEPERIDINE HCL 100 MG/ML IJ SOLN
INTRAMUSCULAR | Status: AC
  Filled 2013-07-28: qty 2

## 2013-07-28 MED ORDER — STERILE WATER FOR IRRIGATION IR SOLN
Status: DC | PRN
  Administered 2013-07-28: 11:00:00

## 2013-07-28 MED ORDER — MEPERIDINE HCL 100 MG/ML IJ SOLN
INTRAMUSCULAR | Status: DC | PRN
  Administered 2013-07-28: 25 mg via INTRAVENOUS
  Administered 2013-07-28: 50 mg via INTRAVENOUS
  Administered 2013-07-28: 25 mg via INTRAVENOUS

## 2013-07-28 MED ORDER — SODIUM CHLORIDE 0.9 % IV SOLN
INTRAVENOUS | Status: DC
  Administered 2013-07-28: 10:00:00 via INTRAVENOUS

## 2013-07-28 NOTE — Op Note (Signed)
[facility] [address] [place], [address]   COLONOSCOPY PROCEDURE REPORT  PATIENT: William Rivera, William Rivera  MR#: [id number] BIRTHDATE: 01-23-1935 , 78  yrs. old GENDER: Male ENDOSCOPIST: Barney Drain, MD REFERRED QM:GNOI Hilma Favors, M.D. PROCEDURE DATE:  07/28/2013 PROCEDURE:   Colonoscopy with cold biopsy polypectomy INDICATIONS:Average risk patient for colon cancer. MEDICATIONS: Demerol 100 mg IV and Versed 6 mg IV  DESCRIPTION OF PROCEDURE:    Physical exam was performed.  Informed consent was obtained from the patient after explaining the benefits, risks, and alternatives to procedure.  The patient was connected to monitor and placed in left lateral position. Continuous oxygen was provided by nasal cannula and IV medicine administered through an indwelling cannula.  After administration of sedation and rectal exam, the patients rectum was intubated and the [id number] ([id number])  colonoscope was advanced under direct visualization to the cecum.  The scope was removed slowly by carefully examining the color, texture, anatomy, and integrity mucosa on the way out.  The patient was recovered in endoscopy and discharged home in satisfactory condition.     COLON FINDINGS: A sessile polyp measuring 3 mm in size was found in the ascending colon.  A polypectomy was performed with a cold snare.  , There was mild diverticulosis noted in the sigmoid colon with associated muscular hypertrophy.  , and Moderate sized internal hemorrhoids were found.  PREP QUALITY: good. CECAL W/D TIME: 10 minutes     COMPLICATIONS: None  ENDOSCOPIC IMPRESSION: ONE COLON polyp REMOVED MILD SIGMOID COLON DIVERTICULOSIS MODERATE INTERNAL HEMORRHOIDS  RECOMMENDATIONS: FOLLOW A HIGH FIBER DIET.  AVOID ITEMS THAT CAUSE BLOATING. BIOPSY RESULTS SHOULD BE BACK IN 7 DAYS.  No need for routine screening TCS after age 22.  COULD DISCUSS BENEFITS V.  RISK IN 10-15  YEARS.       _______________________________ Lorrin MaisBarney Drain, MD 07/28/2013 1:45 PM

## 2013-07-28 NOTE — H&P (Signed)
Primary Care Physician:  [name], MD Primary Gastroenterologist:  Dr. Oneida Alar  Pre-Procedure History & Physical: HPI:  William Rivera is a 78 y.o. male here for [place].  Past Medical History  Diagnosis Date  . Hypertension   . Gout   . Barrett's esophagus     last EGD/Bx 12/11  . Helicobacter pylori gastritis 2000    s/p treatment  . Adenomatous colon polyp 2010  . GERD (gastroesophageal reflux disease)     Past Surgical History  Procedure Laterality Date  . Esophagogastroduodenoscopy  2008    DUODENITIS & GASTRITIS 2o TO NSAIDS/ETOH  . Colonoscopy  2010    simple adenomas  . Neck surgery      DISC REPLACED  . Bladder surgery  3546,5681  . Sinus exploration  2000  . Tonsillectomy      AS A CHILD  . Esophagogastroduodenoscopy  12/2009    short segment Barrett's, small hh, chronic gastritis  . Nasal septum surgery    . Esophagogastroduodenoscopy  01/04/2011    EXN:TZGY gastritis/Barrett's, possible  . Esophagogastroduodenoscopy N/A 11/30/2012    Dr. Oneida Alar- normal esophagus, empiric dilation d/t c/o dysphagia/?cervical web, stomach= mild non erosive gastritis, inflammation on bx, duodenum= no abnormalities in the bulb and second portion of the duodenum. dilation at the gastroesphageal junction.  Azzie Almas dilation N/A 11/30/2012    Procedure: SAVORY DILATION;  Surgeon: Danie Binder, MD;  [facility];  Service: Endoscopy;  Laterality: N/A;  Venia Minks dilation N/A 11/30/2012    Procedure: Venia Minks DILATION;  Surgeon: Danie Binder, MD;  [facility];  Service: Endoscopy;  Laterality: N/A;  . Replacement total knee Right 11/2010    Prior to Admission medications   Medication Sig Start Date End Date Taking? Authorizing Provider  allopurinol (ZYLOPRIM) 100 MG tablet Take 100 mg by mouth daily.     Yes Historical Provider, MD  aspirin EC 81 MG tablet Take 81 mg by mouth 3 (three) times a week.   Yes Historical Provider, MD   atenolol (TENORMIN) 25 MG tablet Take 25 mg by mouth daily.     Yes Historical Provider, MD  omeprazole (PRILOSEC) 20 MG capsule 1 po 30 mins prior to meals bid 11/30/12  Yes Danie Binder, MD  polyethylene glycol-electrolytes (TRILYTE) 420 G solution Take 4,000 mLs by mouth as directed. 07/13/13  Yes Danie Binder, MD  ranitidine (ZANTAC 150 MAXIMUM STRENGTH) 150 MG tablet Take 150 mg by mouth as needed for heartburn.   Yes Historical Provider, MD  sucralfate (CARAFATE) 1 GM/10ML suspension Take 10 mLs (1 g total) by mouth 4 (four) times daily. 07/13/13  Yes Mahala Menghini, PA-C    Allergies as of 07/13/2013  . (No Known Allergies)    Family History  Problem Relation Age of Onset  . Stomach cancer Father 38    deceased  . Colon cancer Neg Hx     History   Social History  . Marital Status: Married    Spouse Name: N/A    Number of Children: 1  . Years of Education: N/A   Occupational History  . truck driver    Social History Main Topics  . Smoking status: Former Smoker -- 3.00 packs/day    Types: Cigarettes  . Smokeless tobacco: Never Used     Comment: quit age 6 - 1 pack weekly   . Alcohol Use: 0.0 oz/week    .01 drink(s) per week  Comment:  Was drinking 1 pint of bourbon each weekend. Pt denies any alcohol use x 2 months on 07/28/2013  . Drug Use: No  . Sexual Activity: Yes    Partners: Female    Patent examiner Protection: None   Other Topics Concern  . Not on file   Social History Narrative  . No narrative on file    Review of Systems: See HPI, otherwise negative ROS   Physical Exam: BP 141/84  Pulse 74  Temp(Src) 97.6 F (36.4 C) (Oral)  Resp 20  Ht 5\' 9"  (1.753 m)  Wt 218 lb (98.884 kg)  BMI 32.18 kg/m2  SpO2 94% General:   Alert,  pleasant and cooperative in NAD Head:  Normocephalic and atraumatic. Neck:  Supple; Lungs:  Clear throughout to auscultation.    Heart:  Regular rate and rhythm. Abdomen:  Soft, nontender and nondistended. Normal  bowel sounds, without guarding, and without rebound.   Neurologic:  Alert and  oriented x4;  grossly normal neurologically.  Impression/Plan:     SCREENING  Plan:  1. TCS TODAY

## 2013-07-28 NOTE — Discharge Instructions (Signed)
You had 1 polyp removed. You have internal hemorrhoids and diverticulosis IN YOUR LEFT COLON.   FOLLOW A HIGH FIBER DIET. AVOID ITEMS THAT CAUSE BLOATING. SEE INFO BELOW.  YOUR BIOPSY RESULTS SHOULD BE BACK IN 7 DAYS.     Colonoscopy Care After Read the instructions outlined below and refer to this sheet in the next week. These discharge instructions provide you with general information on caring for yourself after you leave the hospital. While your treatment has been planned according to the most current medical practices available, unavoidable complications occasionally occur. If you have any problems or questions after discharge, call DR. Kayelee Herbig, [phone].  ACTIVITY  You may resume your regular activity, but move at a slower pace for the next 24 hours.   Take frequent rest periods for the next 24 hours.   Walking will help get rid of the air and reduce the bloated feeling in your belly (abdomen).   No driving for 24 hours (because of the medicine (anesthesia) used during the test).   You may shower.   Do not sign any important legal documents or operate any machinery for 24 hours (because of the anesthesia used during the test).    NUTRITION  Drink plenty of fluids.   You may resume your normal diet as instructed by your doctor.   Begin with a light meal and progress to your normal diet. Heavy or fried foods are harder to digest and may make you feel sick to your stomach (nauseated).   Avoid alcoholic beverages for 24 hours or as instructed.    MEDICATIONS  You may resume your normal medications.   WHAT YOU CAN EXPECT TODAY  Some feelings of bloating in the abdomen.   Passage of more gas than usual.   Spotting of blood in your stool or on the toilet paper  .  IF YOU HAD POLYPS REMOVED DURING THE COLONOSCOPY:  Eat a soft diet IF YOU HAVE NAUSEA, BLOATING, ABDOMINAL PAIN, OR VOMITING.    FINDING OUT THE RESULTS OF YOUR TEST Not all test results are  available during your visit. DR. Oneida Alar WILL CALL YOU WITHIN 7 DAYS OF YOUR PROCEDUE WITH YOUR RESULTS. Do not assume everything is normal if you have not heard from DR. Roch Quach IN ONE WEEK, CALL HER OFFICE AT [phone].  SEEK IMMEDIATE MEDICAL ATTENTION AND CALL THE OFFICE: [phone] IF:  You have more than a spotting of blood in your stool.   Your belly is swollen (abdominal distention).   You are nauseated or vomiting.   You have a temperature over 101F.   You have abdominal pain or discomfort that is severe or gets worse throughout the day.  Polyps, Colon  A polyp is extra tissue that grows inside your body. Colon polyps grow in the large intestine. The large intestine, also called the colon, is part of your digestive system. It is a long, hollow tube at the end of your digestive tract where your body makes and stores stool. Most polyps are not dangerous. They are benign. This means they are not cancerous. But over time, some types of polyps can turn into cancer. Polyps that are smaller than a pea are usually not harmful. But larger polyps could someday become or may already be cancerous. To be safe, doctors remove all polyps and test them.   WHO GETS POLYPS? Anyone can get polyps, but certain people are more likely than others. You may have a greater chance of getting polyps if:  You are  over 50.   You have had polyps before.   Someone in your family has had polyps.   Someone in your family has had cancer of the large intestine.   Find out if someone in your family has had polyps. You may also be more likely to get polyps if you:   Eat a lot of fatty foods   Smoke   Drink alcohol   Do not exercise  Eat too much   TREATMENT  The caregiver will remove the polyp during sigmoidoscopy or colonoscopy.  PREVENTION There is not one sure way to prevent polyps. You might be able to lower your risk of getting them if you:  Eat more fruits and vegetables and less fatty food.     Do not smoke.   Avoid alcohol.   Exercise every day.   Lose weight if you are overweight.   Eating more calcium and folate can also lower your risk of getting polyps. Some foods that are rich in calcium are milk, cheese, and broccoli. Some foods that are rich in folate are chickpeas, kidney beans, and spinach.   High-Fiber Diet A high-fiber diet changes your normal diet to include more whole grains, legumes, fruits, and vegetables. Changes in the diet involve replacing refined carbohydrates with unrefined foods. The calorie level of the diet is essentially unchanged. The Dietary Reference Intake (recommended amount) for adult males is 38 grams per day. For adult females, it is 25 grams per day. Pregnant and lactating women should consume 28 grams of fiber per day. Fiber is the intact part of a plant that is not broken down during digestion. Functional fiber is fiber that has been isolated from the plant to provide a beneficial effect in the body. PURPOSE  Increase stool bulk.   Ease and regulate bowel movements.   Lower cholesterol.  INDICATIONS THAT YOU NEED MORE FIBER  Constipation and hemorrhoids.   Uncomplicated diverticulosis (intestine condition) and irritable bowel syndrome.   Weight management.   As a protective measure against hardening of the arteries (atherosclerosis), diabetes, and cancer.   GUIDELINES FOR INCREASING FIBER IN THE DIET  Start adding fiber to the diet slowly. A gradual increase of about 5 more grams (2 slices of whole-wheat bread, 2 servings of most fruits or vegetables, or 1 bowl of high-fiber cereal) per day is best. Too rapid an increase in fiber may result in constipation, flatulence, and bloating.   Drink enough water and fluids to keep your urine clear or pale yellow. Water, juice, or caffeine-free drinks are recommended. Not drinking enough fluid may cause constipation.   Eat a variety of high-fiber foods rather than one type of fiber.   Try  to increase your intake of fiber through using high-fiber foods rather than fiber pills or supplements that contain small amounts of fiber.   The goal is to change the types of food eaten. Do not supplement your present diet with high-fiber foods, but replace foods in your present diet.  INCLUDE A VARIETY OF FIBER SOURCES  Replace refined and processed grains with whole grains, canned fruits with fresh fruits, and incorporate other fiber sources. White rice, white breads, and most bakery goods contain little or no fiber.   Brown whole-grain rice, buckwheat oats, and many fruits and vegetables are all good sources of fiber. These include: broccoli, Brussels sprouts, cabbage, cauliflower, beets, sweet potatoes, white potatoes (skin on), carrots, tomatoes, eggplant, squash, berries, fresh fruits, and dried fruits.   Cereals appear to be the  richest source of fiber. Cereal fiber is found in whole grains and bran. Bran is the fiber-rich outer coat of cereal grain, which is largely removed in refining. In whole-grain cereals, the bran remains. In breakfast cereals, the largest amount of fiber is found in those with "bran" in their names. The fiber content is sometimes indicated on the label.   You may need to include additional fruits and vegetables each day.   In baking, for 1 cup white flour, you may use the following substitutions:   1 cup whole-wheat flour minus 2 tablespoons.   1/2 cup white flour plus 1/2 cup whole-wheat flour.   Diverticulosis Diverticulosis is a common condition that develops when small pouches (diverticula) form in the wall of the colon. The risk of diverticulosis increases with age. It happens more often in people who eat a low-fiber diet. Most individuals with diverticulosis have no symptoms. Those individuals with symptoms usually experience belly (abdominal) pain, constipation, or loose stools (diarrhea).  HOME CARE INSTRUCTIONS  Increase the amount of fiber in your  diet as directed by your caregiver or dietician. This may reduce symptoms of diverticulosis.   Drink at least 6 to 8 glasses of water each day to prevent constipation.   Try not to strain when you have a bowel movement.   Avoiding nuts and seeds to prevent complications is still an uncertain benefit.       FOODS HAVING HIGH FIBER CONTENT INCLUDE:  Fruits. Apple, peach, pear, tangerine, raisins, prunes.   Vegetables. Brussels sprouts, asparagus, broccoli, cabbage, carrot, cauliflower, romaine lettuce, spinach, summer squash, tomato, winter squash, zucchini.   Starchy Vegetables. Baked beans, kidney beans, lima beans, split peas, lentils, potatoes (with skin).   Grains. Whole wheat bread, brown rice, bran flake cereal, plain oatmeal, white rice, shredded wheat, bran muffins.    SEEK IMMEDIATE MEDICAL CARE IF:  You develop increasing pain or severe bloating.   You have an oral temperature above 101F.   You develop vomiting or bowel movements that are bloody or black.   Hemorrhoids Hemorrhoids are dilated (enlarged) veins around the rectum. Sometimes clots will form in the veins. This makes them swollen and painful. These are called thrombosed hemorrhoids. Causes of hemorrhoids include:  Constipation.   Straining to have a bowel movement.   HEAVY LIFTING HOME CARE INSTRUCTIONS  Eat a well balanced diet and drink 6 to 8 glasses of water every day to avoid constipation. You may also use a bulk laxative.   Avoid straining to have bowel movements.   Keep anal area dry and clean.   Do not use a donut shaped pillow or sit on the toilet for long periods. This increases blood pooling and pain.   Move your bowels when your body has the urge; this will require less straining and will decrease pain and pressure.

## 2013-07-30 ENCOUNTER — Encounter (HOSPITAL_COMMUNITY): Payer: Self-pay | Admitting: Gastroenterology

## 2013-08-05 ENCOUNTER — Telehealth: Payer: Self-pay | Admitting: Gastroenterology

## 2013-08-05 NOTE — Telephone Encounter (Signed)
Please call pt. He had a simple adenoma removed. FOLLOW A High fiber diet. WE DO NOT DO ROUTINE SCREENING AFTER THE AGE OF 77. WE COULD CONSIDER ANOTHER TCS IN 10 years IF THE BENEFITS OUTWEIGH THE RISKS.

## 2013-08-06 NOTE — Telephone Encounter (Signed)
I called and informed pt. He said that the Carafate was expensive and Dr. Oneida Alar said that she could send in something cheaper.   Please advise!

## 2013-08-18 DIAGNOSIS — M1710 Unilateral primary osteoarthritis, unspecified knee: Secondary | ICD-10-CM | POA: Diagnosis not present

## 2013-08-18 DIAGNOSIS — M25569 Pain in unspecified knee: Secondary | ICD-10-CM | POA: Diagnosis not present

## 2013-08-31 MED ORDER — PANTOPRAZOLE SODIUM 40 MG PO TBEC: 40.0000 mg | DELAYED_RELEASE_TABLET | Freq: Two times a day (BID) | ORAL | Status: DC

## 2013-08-31 NOTE — Telephone Encounter (Signed)
Let's try a different PPI since he cannot get the carafate. He is already on H2 blocker too doubt addition of Pepcid would help.   Stop omeprazole. Start pantoprazole 40mg  BID for one month then try once daily.

## 2013-08-31 NOTE — Addendum Note (Signed)
Addended by: Mahala Menghini on: 08/31/2013 04:10 PM   Modules accepted: Orders, Medications

## 2013-08-31 NOTE — Telephone Encounter (Signed)
Called and informed pt.  

## 2013-08-31 NOTE — Telephone Encounter (Signed)
I called pt and apologized that this has not been taken care of. He said he is still having some burning more in his chest than in his stomach. He takes the prilosec bid and that helps a lot. He said he would like to have something in place of the Carafate because that was too expensive. Please advise!

## 2013-09-01 NOTE — Telephone Encounter (Addendum)
PLEASE CALL PT. HE ALSO NEEDS A CXR: PA/LAT TO EVALUATE HIS CHEST PAIN. ZANTAC HELPS MOST WHEN USED AS NEEDED. HE SHOULD TAKE IT EVERY OTHER NIGHT AT BEDTIME. OPV E30 IN OCT 2015 DYSPEPSIA. IF DYSPEPSIA CONTINUES CONSIDER CT ABD/PELVIS.

## 2013-09-01 NOTE — Telephone Encounter (Signed)
REVIEWED. AGREE. 

## 2013-09-02 ENCOUNTER — Ambulatory Visit (HOSPITAL_COMMUNITY)
Admission: RE | Admit: 2013-09-02 | Discharge: 2013-09-02 | Disposition: A | Discharge status: Home / Self Care | Payer: Medicare Other | Source: Ambulatory Visit | Attending: Gastroenterology | Admitting: Gastroenterology

## 2013-09-02 ENCOUNTER — Other Ambulatory Visit: Payer: Self-pay | Admitting: Gastroenterology

## 2013-09-02 DIAGNOSIS — R079 Chest pain, unspecified: Secondary | ICD-10-CM

## 2013-09-02 NOTE — Telephone Encounter (Signed)
Orders for CXR is in, he can go anytime that is convenient for him, no appointment necessary

## 2013-09-02 NOTE — Telephone Encounter (Signed)
Called and informed pt. He will go tomorrow for the CXR.

## 2013-09-08 DIAGNOSIS — H0019 Chalazion unspecified eye, unspecified eyelid: Secondary | ICD-10-CM | POA: Diagnosis not present

## 2013-09-11 NOTE — Telephone Encounter (Addendum)
PLEASE CALL PT. HIS CXR IS NL.

## 2013-09-13 NOTE — Telephone Encounter (Signed)
Called and informed pt.  

## 2013-09-14 ENCOUNTER — Encounter: Payer: Self-pay | Admitting: Gastroenterology

## 2013-09-14 NOTE — Telephone Encounter (Signed)
Pt is aware of OV on 10/21 at 10am with SF and appt card mailed

## 2013-10-12 DIAGNOSIS — H01009 Unspecified blepharitis unspecified eye, unspecified eyelid: Secondary | ICD-10-CM | POA: Diagnosis not present

## 2013-11-10 ENCOUNTER — Encounter: Payer: Self-pay | Admitting: Gastroenterology

## 2013-11-10 ENCOUNTER — Ambulatory Visit (INDEPENDENT_AMBULATORY_CARE_PROVIDER_SITE_OTHER): Payer: Medicare Other | Admitting: Gastroenterology

## 2013-11-10 VITALS — BP 118/75 | HR 81 | Temp 97.5°F | Ht 69.0 in | Wt 220.8 lb

## 2013-11-10 DIAGNOSIS — R1310 Dysphagia, unspecified: Secondary | ICD-10-CM | POA: Diagnosis not present

## 2013-11-10 DIAGNOSIS — K30 Functional dyspepsia: Secondary | ICD-10-CM | POA: Diagnosis not present

## 2013-11-10 DIAGNOSIS — K219 Gastro-esophageal reflux disease without esophagitis: Secondary | ICD-10-CM

## 2013-11-10 MED ORDER — NORTRIPTYLINE HCL 10 MG PO CAPS: ORAL_CAPSULE | ORAL | Status: DC

## 2013-11-10 NOTE — Progress Notes (Signed)
Subjective:    Patient ID: William Rivera, male    DOB: 14-Jan-1936, 78 y.o.   MRN: [id number]  [name], MD  HPI Has burning most of the time IN CHEST. STAYS ACTIVE DURING THE DAY. UP AT NIGHT WITH ARTHRITIS. NO PROBLEMS SWALLOWING. BMs: DAILY(NL). MAY HAVE UPPER ABDOMINAL PAIN. FOOD DOESN'T REALLY MAKE A DIFFERENCE EXCEPT TOMATOA BASED FOODS.  PT DENIES FEVER, CHILLS, HEMATOCHEZIA, nausea, vomiting, melena, diarrhea,  SHORTNESS OF BREATH,  CHANGE IN BOWEL IN HABITS, constipation, OR problems swallowing.   Past Medical History  Diagnosis Date  . Hypertension   . Gout   . Barrett's esophagus     last EGD/Bx 12/11  . Helicobacter pylori gastritis 2000    s/p treatment  . Adenomatous colon polyp 2010  . GERD (gastroesophageal reflux disease)    Past Surgical History  Procedure Laterality Date  . Esophagogastroduodenoscopy  2008    DUODENITIS & GASTRITIS 2o TO NSAIDS/ETOH  . Colonoscopy  2010    simple adenomas  . Neck surgery      DISC REPLACED  . Bladder surgery  [id number]  . Sinus exploration  2000  . Tonsillectomy      AS A CHILD  . Esophagogastroduodenoscopy  12/2009    short segment Barrett's, small hh, chronic gastritis  . Nasal septum surgery    . Esophagogastroduodenoscopy  01/04/2011    VXY:IAXK gastritis/Barrett's, possible  . Esophagogastroduodenoscopy N/A 11/30/2012    Dr. Oneida Alar- normal esophagus, empiric dilation d/t c/o dysphagia/?cervical web, stomach= mild non erosive gastritis, inflammation on bx, duodenum= no abnormalities in the bulb and second portion of the duodenum. dilation at the gastroesphageal junction.  Azzie Almas dilation N/A 11/30/2012    Procedure: SAVORY DILATION;  Surgeon: Danie Binder, MD;  [facility];  Service: Endoscopy;  Laterality: N/A;  Venia Minks dilation N/A 11/30/2012    Procedure: Venia Minks DILATION;  Surgeon: Danie Binder, MD;  [facility];  Service: Endoscopy;  Laterality: N/A;  . Replacement  total knee Right 11/2010  . Colonoscopy N/A 07/28/2013    Procedure: COLONOSCOPY;  Surgeon: Danie Binder, MD;  [facility];  Service: Endoscopy;  Laterality: N/A;  10:30    No Known Allergies  Current Outpatient Prescriptions  Medication Sig Dispense Refill  . allopurinol (ZYLOPRIM) 100 MG tablet Take 100 mg by mouth daily.        Marland Kitchen aspirin EC 81 MG tablet Take 81 mg by mouth 3 (three) times a week.      Marland Kitchen atenolol (TENORMIN) 25 MG tablet Take 25 mg by mouth daily.        Marland Kitchen omeprazole (PRILOSEC) 20 MG capsule Take 20 mg by mouth 2 (two) times daily before a meal.      . ranitidine (ZANTAC 150 MAXIMUM STRENGTH) 150 MG tablet Take 150 mg by mouth as needed for heartburn.         Review of Systems     Objective:   Physical Exam  Vitals reviewed. Constitutional: He is oriented to person, place, and time. He appears well-developed and well-nourished. No distress.  HENT:  Head: Normocephalic and atraumatic.  Mouth/Throat: Oropharynx is clear and moist. No oropharyngeal exudate.  Eyes: Pupils are equal, round, and reactive to light. No scleral icterus.  Neck: Normal range of motion. Neck supple.  Cardiovascular: Normal rate, regular rhythm and normal heart sounds.   Pulmonary/Chest: Effort normal and breath sounds normal. No respiratory distress.  Abdominal: Soft. Bowel sounds  are normal. He exhibits no distension. There is no tenderness.  Musculoskeletal: He exhibits no edema.  Lymphadenopathy:    He has no cervical adenopathy.  Neurological: He is alert and oriented to person, place, and time.  NO FOCAL DEFICITS   Psychiatric: He has a normal mood and affect.          Assessment & Plan:

## 2013-11-10 NOTE — Patient Instructions (Signed)
ADD PAMELOR AT BEDTIME TO HELP WITH ARTHRITIS AND CHEST PAIN.  CONTINUE OMEPRAZOLE.  TAKE 30 MINUTES PRIOR TO YOUR MEALS TWICE DAILY.  ZANTAC HELPS MOST WHEN USED AS NEEDED.  FOLLOW A LOW FAT DIET.   AVOID REFLUX TRIGGERS. SEE INFO BELOW.  FOLLOW UP IN 4 MOS.    Lifestyle and home remedies TO MANAGE REFLUX/CHEST PAIN  You may eliminate or reduce the frequency of heartburn by making the following lifestyle changes:    Control your weight. Being overweight is a major risk factor for heartburn and GERD. Excess pounds put pressure on your abdomen, pushing up your stomach and causing acid to back up into your esophagus.     Eat smaller meals. 4 TO 6 MEALS A DAY. This reduces pressure on the lower esophageal sphincter, helping to prevent the valve from opening and acid from washing back into your esophagus.    Loosen your belt. Clothes that fit tightly around your waist put pressure on your abdomen and the lower esophageal sphincter.    Eliminate heartburn triggers. Everyone has specific triggers. Common triggers such as fatty or fried foods, spicy food, tomato sauce, carbonated beverages, alcohol, chocolate, mint, garlic, onion, caffeine and nicotine may make heartburn worse.     Avoid stooping or bending. Tying your shoes is OK. Bending over for longer periods to weed your garden isn't, especially soon after eating.     Don't lie down after a meal. Wait at least three to four hours after eating before going to bed, and don't lie down right after eating.     PUT THE HEAD OF YOUR BED ON 6 INCH BLOCKS.   Alternative medicine   Several home remedies exist for treating GERD, but they provide only temporary relief. They include drinking baking soda (sodium bicarbonate) added to water or drinking other fluids such as baking soda mixed with cream of tartar and water.    Although these liquids create temporary relief by neutralizing, washing away or buffering acids, eventually they aggravate  the situation by adding gas and fluid to your stomach, increasing pressure and causing more acid reflux. Further, adding more sodium to your diet may increase your blood pressure and add stress to your heart, and excessive bicarbonate ingestion can alter the acid-base balance in your body.

## 2013-11-10 NOTE — Assessment & Plan Note (Signed)
ON BID PPI. C/O BURNING IN CHEST MOST LIKELY DUE TO MEDICAL/DIETARY NON-ADHERENCE AND EXACERBATED BY NONULCER DYSPEPSIA.  ADD PAMELOR QHS CONTINUE OMEPRAZOLE.  TAKE 30 MINUTES PRIOR TO YOUR MEALS TWICE DAILY. LOW FAT DIET AVOID REFLUX TRIGGERS FOLLOW UP IN 4 MOS.

## 2013-11-10 NOTE — Assessment & Plan Note (Signed)
RESOLVED.  CONTINUE TO MONITOR SYMPTOMS.

## 2013-11-10 NOTE — Assessment & Plan Note (Addendum)
SX NOT IDEALLY CONTROLLED.

## 2013-11-10 NOTE — Progress Notes (Signed)
APPT NIC'D °

## 2013-11-12 NOTE — Progress Notes (Signed)
cc'ed to pcp °

## 2013-11-17 DIAGNOSIS — Z6829 Body mass index (BMI) 29.0-29.9, adult: Secondary | ICD-10-CM | POA: Diagnosis not present

## 2013-11-17 DIAGNOSIS — M2550 Pain in unspecified joint: Secondary | ICD-10-CM | POA: Diagnosis not present

## 2013-11-17 DIAGNOSIS — M791 Myalgia: Secondary | ICD-10-CM | POA: Diagnosis not present

## 2013-11-17 DIAGNOSIS — Z23 Encounter for immunization: Secondary | ICD-10-CM | POA: Diagnosis not present

## 2013-12-07 DIAGNOSIS — M545 Low back pain: Secondary | ICD-10-CM | POA: Diagnosis not present

## 2013-12-07 DIAGNOSIS — Z6829 Body mass index (BMI) 29.0-29.9, adult: Secondary | ICD-10-CM | POA: Diagnosis not present

## 2013-12-24 DIAGNOSIS — M5416 Radiculopathy, lumbar region: Secondary | ICD-10-CM | POA: Diagnosis not present

## 2013-12-24 DIAGNOSIS — M47816 Spondylosis without myelopathy or radiculopathy, lumbar region: Secondary | ICD-10-CM | POA: Diagnosis not present

## 2014-01-10 ENCOUNTER — Encounter: Payer: Self-pay | Admitting: Gastroenterology

## 2014-01-24 ENCOUNTER — Ambulatory Visit (INDEPENDENT_AMBULATORY_CARE_PROVIDER_SITE_OTHER): Payer: Medicare Other | Admitting: Physician Assistant

## 2014-01-24 ENCOUNTER — Encounter: Payer: Self-pay | Admitting: Physician Assistant

## 2014-01-24 VITALS — BP 138/88 | HR 84 | Ht 69.0 in | Wt 216.0 lb

## 2014-01-24 DIAGNOSIS — R079 Chest pain, unspecified: Secondary | ICD-10-CM

## 2014-01-24 DIAGNOSIS — E785 Hyperlipidemia, unspecified: Secondary | ICD-10-CM | POA: Diagnosis not present

## 2014-01-24 DIAGNOSIS — I10 Essential (primary) hypertension: Secondary | ICD-10-CM | POA: Diagnosis not present

## 2014-01-24 NOTE — Progress Notes (Signed)
Date:  01/24/2014   ID:  William Rivera, DOB 1935-08-27, MRN [id number]  PCP:  [name], MD  Primary Cardiologist:  Gwenlyn Found     History of Present Illness: William Rivera is a 79 y.o. male was last seen by Dr. Gwenlyn Found on February 2013. His history of hypertension, hyperlipidemia, Barrett's esophagus LVH with mild aortic sclerosis without stenosis. His last stress test was in 2009 was low risk and nonischemic.    Patient reports chest pain which less money was at its worst. 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  He's had 2-3 episodes per week which were mild. The patient currently denies nausea, vomiting, fever, chest pain, shortness of breath, orthopnea, dizziness, PND, cough, congestion, abdominal pain, hematochezia, melena, lower extremity edema..   Wt Readings from Last 3 Encounters:  01/24/14 216 lb (97.977 kg)  11/10/13 220 lb 12.8 oz (100.154 kg)  07/28/13 218 lb (98.884 kg)     Past Medical History  Diagnosis Date  . Hypertension   . Gout   . Barrett's esophagus     last EGD/Bx 12/11  . Helicobacter pylori gastritis 2000    s/p treatment  . Adenomatous colon polyp 2010  . GERD (gastroesophageal reflux disease)   . GASTRITIS 09/23/2008    Qualifier: Diagnosis of  By: Nicole Kindred LPN, Doris      Current Outpatient Prescriptions  Medication Sig Dispense Refill  . allopurinol (ZYLOPRIM) 100 MG tablet Take 100 mg by mouth daily.      Marland Kitchen aspirin EC 81 MG tablet Take 81 mg by mouth 3 (three) times a week.    Marland Kitchen atenolol (TENORMIN) 25 MG tablet Take 25 mg by mouth daily.      Marland Kitchen omeprazole (PRILOSEC) 20 MG capsule Take 20 mg by mouth 2 (two) times daily before a meal.    . ranitidine  (ZANTAC 150 MAXIMUM STRENGTH) 150 MG tablet Take 150 mg by mouth as needed for heartburn.     No current facility-administered medications for this visit.    Allergies:   No Known Allergies  Social History:  The patient  reports that he has quit smoking. His smoking use included Cigarettes. He smoked 3.00 packs per day. He has never used smokeless tobacco. He reports that he drinks alcohol. He reports that he does not use illicit drugs.   Family history:   Family History  Problem Relation Age of Onset  . Stomach cancer Father 34    deceased  . Colon cancer Neg Hx     ROS:  Please see the history of present illness.  All other systems reviewed and negative.   PHYSICAL EXAM: VS:  BP 138/88 mmHg  Pulse 84  Ht 5\' 9"  (1.753 m)  Wt 216 lb (97.977 kg)  BMI 31.88 kg/m2 Well nourished, well developed, in no acute distress HEENT: Pupils are equal round react to light accommodation extraocular movements are intact.  Neck: no JVDNo cervical lymphadenopathy. Cardiac: Regular rate and rhythm without murmurs rubs or gallops. Lungs:  clear to auscultation bilaterally, no wheezing, rhonchi or rales Abd: soft, nontender, positive bowel sounds all quadrants, no hepatosplenomegaly Ext: no lower extremity edema.  2+  radial and 2+ right dorsalis pedis, 1+ left pulses. Skin: warm and dry Neuro:  Grossly normal  EKG:  Sinus rhythm rate 84 bpm    ASSESSMENT AND PLAN:  Problem List Items Addressed This Visit    Hyperlipidemia    Will obtain the last cholesterol panel and labs from primary care provider    Essential hypertension    Blood pressure mildly elevated. No changes in therapy    Chest pain - Primary    The patient's chest pain and associated symptoms are certainly concerning for unstable angina. 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  I have ordered a treadmill Myoview    Relevant Orders      EKG  12-Lead      Myocardial Perfusion Imaging

## 2014-01-24 NOTE — Patient Instructions (Signed)
Your physician has requested that you have an exercise stress myoview. For further information please visit [email or web address]. Please follow instruction sheet, as given.  Your physician wants you to follow-up in 6 months with Dr. Gwenlyn Found. You will receive a reminder letter in the mail 2 months in advance. If you do not receive a letter, please call our office to schedule the follow-up appointment.

## 2014-01-24 NOTE — Assessment & Plan Note (Signed)
Blood pressure mildly elevated. No changes in therapy

## 2014-01-24 NOTE — Assessment & Plan Note (Signed)
Will obtain the last cholesterol panel and labs from primary care provider

## 2014-01-24 NOTE — Assessment & Plan Note (Signed)
The patient's chest pain and associated symptoms are certainly concerning for unstable angina. 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  I have ordered a treadmill Myoview

## 2014-01-25 ENCOUNTER — Telehealth (HOSPITAL_COMMUNITY): Payer: Self-pay

## 2014-01-25 NOTE — Telephone Encounter (Signed)
Encounter complete. 

## 2014-01-26 ENCOUNTER — Ambulatory Visit (HOSPITAL_COMMUNITY)
Admission: RE | Admit: 2014-01-26 | Discharge: 2014-01-26 | Disposition: A | Discharge status: Home / Self Care | Payer: Medicare Other | Source: Ambulatory Visit | Attending: Internal Medicine | Admitting: Internal Medicine

## 2014-01-26 DIAGNOSIS — J45909 Unspecified asthma, uncomplicated: Secondary | ICD-10-CM | POA: Insufficient documentation

## 2014-01-26 DIAGNOSIS — Z6831 Body mass index (BMI) 31.0-31.9, adult: Secondary | ICD-10-CM | POA: Insufficient documentation

## 2014-01-26 DIAGNOSIS — E669 Obesity, unspecified: Secondary | ICD-10-CM | POA: Insufficient documentation

## 2014-01-26 DIAGNOSIS — Z136 Encounter for screening for cardiovascular disorders: Secondary | ICD-10-CM | POA: Diagnosis not present

## 2014-01-26 DIAGNOSIS — M542 Cervicalgia: Secondary | ICD-10-CM | POA: Diagnosis not present

## 2014-01-26 DIAGNOSIS — R079 Chest pain, unspecified: Secondary | ICD-10-CM | POA: Diagnosis not present

## 2014-01-26 DIAGNOSIS — I10 Essential (primary) hypertension: Secondary | ICD-10-CM | POA: Diagnosis not present

## 2014-01-26 DIAGNOSIS — R42 Dizziness and giddiness: Secondary | ICD-10-CM | POA: Diagnosis not present

## 2014-01-26 DIAGNOSIS — I700 Atherosclerosis of aorta: Secondary | ICD-10-CM | POA: Insufficient documentation

## 2014-01-26 MED ORDER — TECHNETIUM TC 99M SESTAMIBI GENERIC - CARDIOLITE
10.0000 | Freq: Once | INTRAVENOUS | Status: AC | PRN
  Administered 2014-01-26: 10 via INTRAVENOUS

## 2014-01-26 MED ORDER — TECHNETIUM TC 99M SESTAMIBI GENERIC - CARDIOLITE
30.0000 | Freq: Once | INTRAVENOUS | Status: AC | PRN
  Administered 2014-01-26: 30 via INTRAVENOUS

## 2014-01-26 NOTE — Procedures (Addendum)
[facility] [address] [place] [address] [phone]  Cardiology Nuclear Med Study  William Rivera is a 79 y.o. male     MRN : [id number]     DOB: Dec 29, 1935  Procedure Date: 01/26/2014  Nuclear Med Background Indication for Stress Test:  Evaluation for Ischemia History:  Asthma and Last NUC MPI on 12/14/2007-normal;Mild aortic sclerosis without stenosisLVH Cardiac Risk Factors: Hypertension, Lipids and Obesity  Symptoms:  Chest Pain, Dizziness, Light-Headedness and pain radiates to neck   Nuclear Pre-Procedure Caffeine/Decaff Intake:  9:00pm NPO After: 7:00am   IV Site: R Forearm  IV 0.9% NS with Angio Cath:  22g  Chest Size (in):  50" IV Started by: Rolene Course, RN  Height: 5\' 9"  (1.753 m)  Cup Size: n/a  BMI:  Body mass index is 31.88 kg/(m^2). Weight:  216 lb (97.977 kg)   Tech Comments:  n/a    Nuclear Med Study 1 or 2 day study: 1 day  Stress Test Type:  Stress  Order Authorizing Provider:  Quay Burow, MD   Resting Radionuclide: Technetium 57m Sestamibi  Resting Radionuclide Dose: 10.5 mCi   Stress Radionuclide:  Technetium 61m Sestamibi  Stress Radionuclide Dose: 31.8 mCi           Stress Protocol Rest HR: 86 Stress HR: 136  Rest BP: 145/90 Stress BP: 194/98  Exercise Time (min): 6:30 METS: 7.0   Predicted Max HR: 144 bpm % Max HR: 94.44 bpm Rate Pressure Product: 28152  Dose of Adenosine (mg):  n/a Dose of Lexiscan: n/a mg  Dose of Atropine (mg): n/a Dose of Dobutamine: n/a mcg/kg/min (at max HR)  Stress Test Technologist: Leane Para, CCT Nuclear Technologist: Otho Perl, CNMT   Rest Procedure:  Myocardial perfusion imaging was performed at rest 45 minutes following the intravenous administration of Technetium 24m Sestamibi. Stress Procedure:  The patient performed treadmill exercise using a Bruce  Protocol for 6:30 minutes. The patient stopped due to SOB and Fatigue and denied any  chest pain.  There were no significant ST-T wave changes.  Technetium 50m Sestamibi was injected at peak exercise and myocardial perfusion imaging was performed after a brief delay.  Transient Ischemic Dilatation (Normal <1.22):  1.12 QGS EDV:  77 ml QGS ESV:  31 ml LV Ejection Fraction: 60%      Rest ECG: NSR - Normal EKG  Stress ECG: No significant change from baseline ECG  QPS Raw Data Images:  Normal; no motion artifact; normal heart/lung ratio. Stress Images:  Normal homogeneous uptake in all areas of the myocardium. Rest Images:  Normal homogeneous uptake in all areas of the myocardium. Subtraction (SDS):  No evidence of ischemia.  Impression Exercise Capacity:  Good exercise capacity. BP Response:  Normal blood pressure response. Clinical Symptoms:  No significant symptoms noted. ECG Impression:  No significant ST segment change suggestive of ischemia. Comparison with Prior Nuclear Study: No significant change from previous study  Overall Impression:  Normal stress nuclear study.  LV Wall Motion:  NL LV Function; NL Wall Motion   Lorretta Harp, MD  01/26/2014 12:43 PM

## 2014-01-31 ENCOUNTER — Other Ambulatory Visit: Payer: Self-pay | Admitting: Gastroenterology

## 2014-02-01 ENCOUNTER — Telehealth: Payer: Self-pay | Admitting: Physician Assistant

## 2014-02-01 NOTE — Telephone Encounter (Signed)
Results reported to pt, he voiced understanding.

## 2014-02-01 NOTE — Telephone Encounter (Signed)
Pt would like stress test results from -01-26-14 please.

## 2014-02-09 ENCOUNTER — Encounter: Payer: Self-pay | Admitting: Gastroenterology

## 2014-02-09 ENCOUNTER — Ambulatory Visit (INDEPENDENT_AMBULATORY_CARE_PROVIDER_SITE_OTHER): Payer: Medicare Other | Admitting: Gastroenterology

## 2014-02-09 ENCOUNTER — Other Ambulatory Visit: Payer: Self-pay

## 2014-02-09 ENCOUNTER — Telehealth: Payer: Self-pay

## 2014-02-09 VITALS — BP 127/77 | HR 68 | Temp 97.0°F | Ht 69.0 in | Wt 212.4 lb

## 2014-02-09 DIAGNOSIS — K30 Functional dyspepsia: Secondary | ICD-10-CM | POA: Diagnosis not present

## 2014-02-09 DIAGNOSIS — R1310 Dysphagia, unspecified: Secondary | ICD-10-CM | POA: Diagnosis not present

## 2014-02-09 DIAGNOSIS — K2100 Gastro-esophageal reflux disease with esophagitis, without bleeding: Secondary | ICD-10-CM

## 2014-02-09 DIAGNOSIS — R1314 Dysphagia, pharyngoesophageal phase: Secondary | ICD-10-CM

## 2014-02-09 MED ORDER — DEXLANSOPRAZOLE 60 MG PO CPDR: DELAYED_RELEASE_CAPSULE | ORAL | Status: DC

## 2014-02-09 NOTE — Telephone Encounter (Signed)
Called and spoke with pts wife Enid Derry. She is aware of xray appt on 02/16/2014 @ 845. Informed her that pt needs to be fasting.

## 2014-02-09 NOTE — Progress Notes (Signed)
Subjective:    Patient ID: William Rivera, male    DOB: 1936-01-13, 79 y.o.   MRN: [id number]  William Kilts, MD  HPI When he was taking omeprazole he would have burning in chest WHEN SX STARTEd TO GET OUT OF CONTROL; HE WAS ON OMPERAZOLE BID. SO HE THOUGHT HE'D REDUced THE DOSE TO ONCE A DAy AND AT ZANTAC AT NIGHT(some nights), BUT HIS SX DIDN'T GET BETTER, THEY GOT WORSE, SO HE SAW HIS HEART DOCTOR AND HIS STRESS TEST WAS NEGATIVE. Back on omeprazole for last 2 weeks sx not better. HARD TO SWALLOW SOLIDS, BUT LIQUIDS OK. WEIGHT DOWN 6 LBS SINCE JUL 2015. HARD TO SWALLOW CAPSULES BUT CAN GET IT DOWN. NO TAKING ASA. NO BC/GOODY POWDERS, IBUPROFEN/MOTRIN, OR NAPROXEN/ALEVE. NO ETOH FOR > 1 MO SINCE HAVING BURNING IN HIS CHEST. PAMELOR DIDN'T HELP AFTER 1 WEEK SO HE STOPPED. MOSTLY BURNING CHEST BUT CAN FEEL IT IN HIS UPPER STOMACH. MAY FEEL MILD SOB WHEN IT'S BURNING REAL BAD. BMs: DAILY. NO TOBACCO PRODUCTS.  PT DENIES FEVER, CHILLS, HEMATOCHEZIA, nausea, vomiting, melena, diarrhea,  SHORTNESS OF BREATH,  CHANGE IN BOWEL IN HABITS, OR constipation.  Past Medical History  Diagnosis Date  . Hypertension   . Gout   . Barrett's esophagus     last EGD/Bx 12/11  . Helicobacter pylori gastritis 2000    s/p treatment  . Adenomatous colon polyp 2010  . GERD (gastroesophageal reflux disease)   . GASTRITIS 09/23/2008    Qualifier: Diagnosis of  By: Nicole Kindred LPN, Doris     Past Surgical History  Procedure Laterality Date  . Esophagogastroduodenoscopy  2008    DUODENITIS & GASTRITIS 2o TO NSAIDS/ETOH  . Colonoscopy  2010    simple adenomas  . Neck surgery      DISC REPLACED  . Bladder surgery  [id number]  . Sinus exploration  2000  . Tonsillectomy      AS A CHILD  . Esophagogastroduodenoscopy  12/2009    short segment Barrett's, small hh, chronic gastritis  . Nasal septum surgery    . Esophagogastroduodenoscopy  01/04/2011    FXT:KWIO gastritis/Barrett's, possible  .  Esophagogastroduodenoscopy N/A 11/30/2012    Dr. Oneida Alar- normal esophagus, empiric dilation d/t c/o dysphagia/?cervical web, stomach= mild non erosive gastritis, inflammation on bx, duodenum= no abnormalities in the bulb and second portion of the duodenum. dilation at the gastroesphageal junction.  Azzie Almas dilation N/A 11/30/2012    Procedure: SAVORY DILATION;  Surgeon: Danie Binder, MD;  Location: [facility];  Service: Endoscopy;  Laterality: N/A;  Venia Minks dilation N/A 11/30/2012    Procedure: Venia Minks DILATION;  Surgeon: Danie Binder, MD;  Location: [facility];  Service: Endoscopy;  Laterality: N/A;  . 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 Colonoscopy N/A 07/28/2013    Procedure: COLONOSCOPY;  Surgeon: Danie Binder, MD;  Location: [facility];  Service: Endoscopy;  Laterality: N/A;  10:30   No Known Allergies  Current Outpatient Prescriptions  Medication Sig Dispense Refill  . allopurinol (ZYLOPRIM) 100 MG tablet Take 100 mg by mouth daily.      .      . atenolol (TENORMIN) 25 MG tablet Take 25 mg by mouth daily.      Marland Kitchen omeprazole (PRILOSEC) 20 MG capsule Take 20 mg by mouth 2 (two) times daily before a meal.    . ranitidine (ZANTAC 150 MAXIMUM STRENGTH) 150 MG tablet Take 150 mg by mouth as needed for heartburn.    Marland Kitchen  Review of Systems PER HPI OTHERWISE ALL SYSTEMS ARE NEGATIVE. LAST EGD/DIL NOV 2014-EMPIRIC DILATION(EGD 2011/2012). COMPLETE ABD U/S MAY 2015. CXR: PA/LAT AUG 2015. STRESS TEST 2016: NEG.    Objective:   Physical Exam  Constitutional: He is oriented to person, place, and time. He appears well-developed and well-nourished. No distress.  HENT:  Head: Normocephalic and atraumatic.  Mouth/Throat: Oropharynx is clear and moist. No oropharyngeal exudate.  Eyes: Pupils are equal, round, and reactive to light. No scleral icterus.  Neck: Normal range of motion. Neck supple.  Cardiovascular: Normal rate, regular rhythm and normal heart sounds.     Pulmonary/Chest: Effort normal and breath sounds normal. No respiratory distress.  Abdominal: Soft. Bowel sounds are normal. He exhibits no distension. There is tenderness. There is no rebound and no guarding.  MILD PRESSURE IN THE EPIGASTRIUM WITH PALPATION.   Musculoskeletal: He exhibits no edema.  Lymphadenopathy:    He has no cervical adenopathy.  Neurological: He is alert and oriented to person, place, and time.  NO  NEW FOCAL DEFICITS   Psychiatric: He has a normal mood and affect.  FLAT AFFECT, SLIGHTLY ANXIOUS MOOD   Vitals reviewed.         Assessment & Plan:

## 2014-02-09 NOTE — Assessment & Plan Note (Signed)
PT DID NOT ADHERE TO A TRIAL OF TCA.  CONTINUE TO MONITOR SYMPTOMS.

## 2014-02-09 NOTE — Progress Notes (Signed)
ON RECALL LIST  °

## 2014-02-09 NOTE — Patient Instructions (Addendum)
ADD DEXILANT EVERY MORNING.  ZANTAC HELPS MOST WHEN USED AS NEEDED.  FOLLOW A LOW FAT DIET. SEE INFO BELOW.   BARIUM SWALLOW NEXT WEEK.  YOUR BIOPSY WILL BE BACK IN 14 DAYS OR YOU CAN LOOK THEM UP ON MY CHART AFTER JAN 31.  CALL IN 2 WEEKS IF YOU ARE NOT GETTING BETTER.   FOLLOW UP IN 3 MOS.   Low-Fat Diet BREADS, CEREALS, PASTA, RICE, DRIED PEAS, AND BEANS These products are high in carbohydrates and most are low in fat. Therefore, they can be increased in the diet as substitutes for fatty foods. They too, however, contain calories and should not be eaten in excess. Cereals can be eaten for snacks as well as for breakfast.   FRUITS AND VEGETABLES It is good to eat fruits and vegetables. Besides being sources of fiber, both are rich in vitamins and some minerals. They help you get the daily allowances of these nutrients. Fruits and vegetables can be used for snacks and desserts.  MEATS Limit lean meat, chicken, [place], and fish to no more than 6 ounces per day. Beef, Pork, and Lamb Use lean cuts of beef, pork, and lamb. Lean cuts include:  Extra-lean ground beef.  Arm roast.  Sirloin tip.  Center-cut ham.  Round steak.  Loin chops.  Rump roast.  Tenderloin.  Trim all fat off the outside of meats before cooking. It is not necessary to severely decrease the intake of red meat, but lean choices should be made. Lean meat is rich in protein and contains a highly absorbable form of iron. Premenopausal women, in particular, should avoid reducing lean red meat because this could increase the risk for low red blood cells (iron-deficiency anemia).  Chicken and [place] These are good sources of protein. The fat of poultry can be reduced by removing the skin and underlying fat layers before cooking. Chicken and [place] can be substituted for lean red meat in the diet. Poultry should not be fried or covered with high-fat sauces. Fish and Shellfish Fish is a good source of protein. Shellfish  contain cholesterol, but they usually are low in saturated fatty acids. The preparation of fish is important. Like chicken and [place], they should not be fried or covered with high-fat sauces. EGGS Egg whites contain no fat or cholesterol. They can be eaten often. Try 1 to 2 egg whites instead of whole eggs in recipes or use egg substitutes that do not contain yolk. MILK AND DAIRY PRODUCTS Use skim or 1% milk instead of 2% or whole milk. Decrease whole milk, natural, and processed cheeses. Use nonfat or low-fat (2%) cottage cheese or low-fat cheeses made from vegetable oils. Choose nonfat or low-fat (1 to 2%) yogurt. Experiment with evaporated skim milk in recipes that call for heavy cream. Substitute low-fat yogurt or low-fat cottage cheese for sour cream in dips and salad dressings. Have at least 2 servings of low-fat dairy products, such as 2 glasses of skim (or 1%) milk each day to help get your daily calcium intake. FATS AND OILS Reduce the total intake of fats, especially saturated fat. Butterfat, lard, and beef fats are high in saturated fat and cholesterol. These should be avoided as much as possible. Vegetable fats do not contain cholesterol, but certain vegetable fats, such as coconut oil, palm oil, and palm kernel oil are very high in saturated fats. These should be limited. These fats are often used in bakery goods, processed foods, popcorn, oils, and nondairy creamers. Vegetable shortenings and some  peanut butters contain hydrogenated oils, which are also saturated fats. Read the labels on these foods and check for saturated vegetable oils. Unsaturated vegetable oils and fats do not raise blood cholesterol. However, they should be limited because they are fats and are high in calories. Total fat should still be limited to 30% of your daily caloric intake. Desirable liquid vegetable oils are corn oil, cottonseed oil, olive oil, canola oil, safflower oil, soybean oil, and sunflower oil. Peanut oil  is not as good, but small amounts are acceptable. Buy a heart-healthy tub margarine that has no partially hydrogenated oils in the ingredients. Mayonnaise and salad dressings often are made from unsaturated fats, but they should also be limited because of their high calorie and fat content. Seeds, nuts, peanut butter, olives, and avocados are high in fat, but the fat is mainly the unsaturated type. These foods should be limited mainly to avoid excess calories and fat. OTHER EATING TIPS Snacks  Most sweets should be limited as snacks. They tend to be rich in calories and fats, and their caloric content outweighs their nutritional value. Some good choices in snacks are graham crackers, melba toast, soda crackers, bagels (no egg), English muffins, fruits, and vegetables. These snacks are preferable to snack crackers, [place] fries, TORTILLA CHIPS, and POTATO chips. Popcorn should be air-popped or cooked in small amounts of liquid vegetable oil. Desserts Eat fruit, low-fat yogurt, and fruit ices instead of pastries, cake, and cookies. Sherbet, angel food cake, gelatin dessert, frozen low-fat yogurt, or other frozen products that do not contain saturated fat (pure fruit juice bars, frozen ice pops) are also acceptable.  COOKING METHODS Choose those methods that use little or no fat. They include: Poaching.  Braising.  Steaming.  Grilling.  Baking.  Stir-frying.  Broiling.  Microwaving.  Foods can be cooked in a nonstick pan without added fat, or use a nonfat cooking spray in regular cookware. Limit fried foods and avoid frying in saturated fat. Add moisture to lean meats by using water, broth, cooking wines, and other nonfat or low-fat sauces along with the cooking methods mentioned above. Soups and stews should be chilled after cooking. The fat that forms on top after a few hours in the refrigerator should be skimmed off. When preparing meals, avoid using excess salt. Salt can contribute to raising  blood pressure in some people.  EATING AWAY FROM HOME Order entres, potatoes, and vegetables without sauces or butter. When meat exceeds the size of a deck of cards (3 to 4 ounces), the rest can be taken home for another meal. Choose vegetable or fruit salads and ask for low-calorie salad dressings to be served on the side. Use dressings sparingly. Limit high-fat toppings, such as bacon, crumbled eggs, cheese, sunflower seeds, and olives. Ask for heart-healthy tub margarine instead of butter.

## 2014-02-09 NOTE — Assessment & Plan Note (Addendum)
SX UNCONTROLLED ASSOCIATED WITH WEIGHT LOSS, IN PT FAILING TO RESPOND TO BID PPI. U/S, CXR, STRESS TEST UTD.  ADD DEXILANT QAM. #15 SAMPLES GIVEN. ZANTAC PRN PT DECLINED RX FOR VISCOUS LIDOCAINE OR PAMELOR. LOW FAT DIET BPE NEXT WEEK. NO INDICATION FOR EGD AT THIS TIME. CALL IN 2 WEEK SIF SX UNCONTROLLED AND MILD REFLUX/ESO MOTILITY DISORDER, PT WILL NEED BACLOFEN. FOLLOW UP IN 3 MOS.

## 2014-02-09 NOTE — Assessment & Plan Note (Addendum)
ASSOCIATED WITH WEIGHT LOSS-MOST LIKELY DUE TO UNCONTROLLED GERD AND/OR ESOPHAGEAL MOTILITY DISORDER, DOUBT STRICTURE. EGD UTD   STOP OMEPRAZOLE. ADD DEXILANT QAM. #15 SAMPLES GIVEN. ZANTAC PRN PT DECLINED RX FOR VISCOUS LIDOCAINE OR PAMELOR. LOW FAT DIET BPE NEXT WEEK. NO INDICATION FOR EGD AT THIS TIME. CALL IN 2 WEEK SIF SX UNCONTROLLED AND MILD REFLUX/ESO MOTILITY DISORDER, PT WILL NEED BACLOFEN. FOLLOW UP IN 3 MOS.

## 2014-02-14 NOTE — Progress Notes (Signed)
cc'ed to pcp °

## 2014-02-16 ENCOUNTER — Ambulatory Visit (HOSPITAL_COMMUNITY)
Admission: RE | Admit: 2014-02-16 | Discharge: 2014-02-16 | Disposition: A | Discharge status: Home / Self Care | Payer: Medicare Other | Source: Ambulatory Visit | Attending: Gastroenterology | Admitting: Gastroenterology

## 2014-02-16 DIAGNOSIS — R1310 Dysphagia, unspecified: Secondary | ICD-10-CM | POA: Diagnosis not present

## 2014-02-16 DIAGNOSIS — K2100 Gastro-esophageal reflux disease with esophagitis, without bleeding: Secondary | ICD-10-CM

## 2014-02-16 DIAGNOSIS — K219 Gastro-esophageal reflux disease without esophagitis: Secondary | ICD-10-CM | POA: Diagnosis not present

## 2014-02-24 ENCOUNTER — Ambulatory Visit: Payer: Medicare Other | Admitting: Gastroenterology

## 2014-02-24 NOTE — Telephone Encounter (Addendum)
PLEASE CALL PT. HIS BARIUM SWALLOW IS NORMAL. HIS TROUBLE SWALLOWING IS MOST LIKELY DUE TO ACID REFLUX.   CONTINUE DEXILANT EVERY MORNING.  USE ZANTAC AS NEEDED.  FOLLOW A LOW FAT DIET.   IF HIS SYMPTOM ARE NOT BETTER WITH DEXILANT THEN HE WILL NEED BACLOFEN 10 MG 30 MINS PRIOR TO MEALS THREE TIMES A DAY.   FOLLOW UP IN 3 MOS.

## 2014-02-28 NOTE — Telephone Encounter (Signed)
Pt's wife is aware and pt will call if he does not improve.

## 2014-03-01 NOTE — Telephone Encounter (Signed)
Reminder in epic °

## 2014-03-14 DIAGNOSIS — H8110 Benign paroxysmal vertigo, unspecified ear: Secondary | ICD-10-CM | POA: Diagnosis not present

## 2014-04-06 DIAGNOSIS — L570 Actinic keratosis: Secondary | ICD-10-CM | POA: Diagnosis not present

## 2014-04-06 DIAGNOSIS — X32XXXD Exposure to sunlight, subsequent encounter: Secondary | ICD-10-CM | POA: Diagnosis not present

## 2014-04-12 ENCOUNTER — Encounter: Payer: Self-pay | Admitting: Gastroenterology

## 2014-04-12 DIAGNOSIS — H00024 Hordeolum internum left upper eyelid: Secondary | ICD-10-CM | POA: Diagnosis not present

## 2014-04-12 DIAGNOSIS — H00021 Hordeolum internum right upper eyelid: Secondary | ICD-10-CM | POA: Diagnosis not present

## 2014-04-12 DIAGNOSIS — H00022 Hordeolum internum right lower eyelid: Secondary | ICD-10-CM | POA: Diagnosis not present

## 2014-04-12 DIAGNOSIS — H40013 Open angle with borderline findings, low risk, bilateral: Secondary | ICD-10-CM | POA: Diagnosis not present

## 2014-04-25 ENCOUNTER — Telehealth: Payer: Self-pay

## 2014-04-25 NOTE — Telephone Encounter (Signed)
REVIEWED-NO ADDITIONAL RECOMMENDATIONS. 

## 2014-04-25 NOTE — Telephone Encounter (Signed)
Pt called and stated he got a letter for a follow up visit. He states he is doing fine and feels like he doesn't need to come in at this time.

## 2014-04-28 DIAGNOSIS — Z6829 Body mass index (BMI) 29.0-29.9, adult: Secondary | ICD-10-CM | POA: Diagnosis not present

## 2014-04-28 DIAGNOSIS — E663 Overweight: Secondary | ICD-10-CM | POA: Diagnosis not present

## 2014-04-28 DIAGNOSIS — M2550 Pain in unspecified joint: Secondary | ICD-10-CM | POA: Diagnosis not present

## 2014-04-28 DIAGNOSIS — M1991 Primary osteoarthritis, unspecified site: Secondary | ICD-10-CM | POA: Diagnosis not present

## 2014-04-28 NOTE — Telephone Encounter (Signed)
noted 

## 2014-05-10 ENCOUNTER — Encounter: Payer: Self-pay | Admitting: *Deleted

## 2014-05-26 DIAGNOSIS — R079 Chest pain, unspecified: Secondary | ICD-10-CM | POA: Diagnosis not present

## 2014-05-26 DIAGNOSIS — I10 Essential (primary) hypertension: Secondary | ICD-10-CM | POA: Diagnosis not present

## 2014-05-27 DIAGNOSIS — R079 Chest pain, unspecified: Secondary | ICD-10-CM | POA: Diagnosis not present

## 2014-05-27 DIAGNOSIS — I10 Essential (primary) hypertension: Secondary | ICD-10-CM | POA: Diagnosis not present

## 2014-06-14 ENCOUNTER — Encounter: Payer: Self-pay | Admitting: Cardiovascular Disease

## 2014-06-28 DIAGNOSIS — X32XXXD Exposure to sunlight, subsequent encounter: Secondary | ICD-10-CM | POA: Diagnosis not present

## 2014-06-28 DIAGNOSIS — L570 Actinic keratosis: Secondary | ICD-10-CM | POA: Diagnosis not present

## 2014-07-05 DIAGNOSIS — R7309 Other abnormal glucose: Secondary | ICD-10-CM | POA: Diagnosis not present

## 2014-07-05 DIAGNOSIS — Z23 Encounter for immunization: Secondary | ICD-10-CM | POA: Diagnosis not present

## 2014-07-05 DIAGNOSIS — Z125 Encounter for screening for malignant neoplasm of prostate: Secondary | ICD-10-CM | POA: Diagnosis not present

## 2014-07-05 DIAGNOSIS — E6609 Other obesity due to excess calories: Secondary | ICD-10-CM | POA: Diagnosis not present

## 2014-07-05 DIAGNOSIS — Z0000 Encounter for general adult medical examination without abnormal findings: Secondary | ICD-10-CM | POA: Diagnosis not present

## 2014-07-05 DIAGNOSIS — Z1389 Encounter for screening for other disorder: Secondary | ICD-10-CM | POA: Diagnosis not present

## 2014-07-05 DIAGNOSIS — Z6831 Body mass index (BMI) 31.0-31.9, adult: Secondary | ICD-10-CM | POA: Diagnosis not present

## 2014-07-06 ENCOUNTER — Encounter: Payer: Self-pay | Admitting: Gastroenterology

## 2014-08-02 DIAGNOSIS — H11041 Peripheral pterygium, stationary, right eye: Secondary | ICD-10-CM | POA: Diagnosis not present

## 2014-08-02 DIAGNOSIS — H0289 Other specified disorders of eyelid: Secondary | ICD-10-CM | POA: Diagnosis not present

## 2014-08-22 DIAGNOSIS — Z6832 Body mass index (BMI) 32.0-32.9, adult: Secondary | ICD-10-CM | POA: Diagnosis not present

## 2014-08-22 DIAGNOSIS — E782 Mixed hyperlipidemia: Secondary | ICD-10-CM | POA: Diagnosis not present

## 2014-08-22 DIAGNOSIS — M1991 Primary osteoarthritis, unspecified site: Secondary | ICD-10-CM | POA: Diagnosis not present

## 2014-08-22 DIAGNOSIS — I10 Essential (primary) hypertension: Secondary | ICD-10-CM | POA: Diagnosis not present

## 2014-08-22 DIAGNOSIS — C257 Malignant neoplasm of other parts of pancreas: Secondary | ICD-10-CM

## 2014-08-22 DIAGNOSIS — L309 Dermatitis, unspecified: Secondary | ICD-10-CM | POA: Diagnosis not present

## 2014-08-22 HISTORY — DX: Malignant neoplasm of other parts of pancreas: C25.7

## 2014-08-29 DIAGNOSIS — Z6832 Body mass index (BMI) 32.0-32.9, adult: Secondary | ICD-10-CM | POA: Diagnosis not present

## 2014-08-29 DIAGNOSIS — R8290 Unspecified abnormal findings in urine: Secondary | ICD-10-CM | POA: Diagnosis not present

## 2014-08-29 DIAGNOSIS — E6609 Other obesity due to excess calories: Secondary | ICD-10-CM | POA: Diagnosis not present

## 2014-08-29 DIAGNOSIS — Z1389 Encounter for screening for other disorder: Secondary | ICD-10-CM | POA: Diagnosis not present

## 2014-08-29 DIAGNOSIS — T7840XA Allergy, unspecified, initial encounter: Secondary | ICD-10-CM | POA: Diagnosis not present

## 2014-08-30 DIAGNOSIS — M79646 Pain in unspecified finger(s): Secondary | ICD-10-CM | POA: Diagnosis not present

## 2014-08-30 DIAGNOSIS — M79642 Pain in left hand: Secondary | ICD-10-CM | POA: Diagnosis not present

## 2014-08-30 DIAGNOSIS — M79641 Pain in right hand: Secondary | ICD-10-CM | POA: Diagnosis not present

## 2014-09-05 ENCOUNTER — Encounter (HOSPITAL_COMMUNITY): Payer: Self-pay | Admitting: *Deleted

## 2014-09-05 ENCOUNTER — Inpatient Hospital Stay (HOSPITAL_COMMUNITY)
Admission: EM | Admit: 2014-09-05 | Discharge: 2014-09-07 | DRG: 438 | Disposition: A | Discharge status: Home / Self Care | Payer: Medicare Other | Attending: Family Medicine | Admitting: Family Medicine

## 2014-09-05 ENCOUNTER — Emergency Department (HOSPITAL_COMMUNITY): Payer: Medicare Other

## 2014-09-05 DIAGNOSIS — E872 Acidosis, unspecified: Secondary | ICD-10-CM | POA: Diagnosis present

## 2014-09-05 DIAGNOSIS — R748 Abnormal levels of other serum enzymes: Secondary | ICD-10-CM | POA: Insufficient documentation

## 2014-09-05 DIAGNOSIS — Z800 Family history of malignant neoplasm of digestive organs: Secondary | ICD-10-CM

## 2014-09-05 DIAGNOSIS — R945 Abnormal results of liver function studies: Secondary | ICD-10-CM

## 2014-09-05 DIAGNOSIS — K219 Gastro-esophageal reflux disease without esophagitis: Secondary | ICD-10-CM | POA: Diagnosis present

## 2014-09-05 DIAGNOSIS — E785 Hyperlipidemia, unspecified: Secondary | ICD-10-CM | POA: Diagnosis present

## 2014-09-05 DIAGNOSIS — F1010 Alcohol abuse, uncomplicated: Secondary | ICD-10-CM | POA: Diagnosis present

## 2014-09-05 DIAGNOSIS — K858 Other acute pancreatitis: Secondary | ICD-10-CM | POA: Diagnosis not present

## 2014-09-05 DIAGNOSIS — C259 Malignant neoplasm of pancreas, unspecified: Secondary | ICD-10-CM | POA: Diagnosis present

## 2014-09-05 DIAGNOSIS — R739 Hyperglycemia, unspecified: Secondary | ICD-10-CM | POA: Diagnosis present

## 2014-09-05 DIAGNOSIS — Z87891 Personal history of nicotine dependence: Secondary | ICD-10-CM | POA: Diagnosis not present

## 2014-09-05 DIAGNOSIS — R1011 Right upper quadrant pain: Secondary | ICD-10-CM

## 2014-09-05 DIAGNOSIS — N289 Disorder of kidney and ureter, unspecified: Secondary | ICD-10-CM

## 2014-09-05 DIAGNOSIS — I129 Hypertensive chronic kidney disease with stage 1 through stage 4 chronic kidney disease, or unspecified chronic kidney disease: Secondary | ICD-10-CM | POA: Diagnosis present

## 2014-09-05 DIAGNOSIS — N2889 Other specified disorders of kidney and ureter: Secondary | ICD-10-CM | POA: Diagnosis not present

## 2014-09-05 DIAGNOSIS — N179 Acute kidney failure, unspecified: Secondary | ICD-10-CM | POA: Diagnosis not present

## 2014-09-05 DIAGNOSIS — R17 Unspecified jaundice: Secondary | ICD-10-CM

## 2014-09-05 DIAGNOSIS — K760 Fatty (change of) liver, not elsewhere classified: Secondary | ICD-10-CM | POA: Diagnosis not present

## 2014-09-05 DIAGNOSIS — K838 Other specified diseases of biliary tract: Secondary | ICD-10-CM | POA: Diagnosis not present

## 2014-09-05 DIAGNOSIS — K859 Acute pancreatitis without necrosis or infection, unspecified: Secondary | ICD-10-CM | POA: Diagnosis present

## 2014-09-05 DIAGNOSIS — N182 Chronic kidney disease, stage 2 (mild): Secondary | ICD-10-CM | POA: Diagnosis present

## 2014-09-05 DIAGNOSIS — N281 Cyst of kidney, acquired: Secondary | ICD-10-CM | POA: Diagnosis not present

## 2014-09-05 DIAGNOSIS — K828 Other specified diseases of gallbladder: Secondary | ICD-10-CM | POA: Diagnosis not present

## 2014-09-05 DIAGNOSIS — L299 Pruritus, unspecified: Secondary | ICD-10-CM | POA: Diagnosis not present

## 2014-09-05 DIAGNOSIS — R109 Unspecified abdominal pain: Secondary | ICD-10-CM | POA: Diagnosis present

## 2014-09-05 DIAGNOSIS — K831 Obstruction of bile duct: Secondary | ICD-10-CM | POA: Diagnosis present

## 2014-09-05 DIAGNOSIS — R7989 Other specified abnormal findings of blood chemistry: Secondary | ICD-10-CM

## 2014-09-05 DIAGNOSIS — I10 Essential (primary) hypertension: Secondary | ICD-10-CM | POA: Diagnosis present

## 2014-09-05 DIAGNOSIS — K851 Biliary acute pancreatitis: Secondary | ICD-10-CM | POA: Diagnosis not present

## 2014-09-05 DIAGNOSIS — K7689 Other specified diseases of liver: Secondary | ICD-10-CM | POA: Diagnosis not present

## 2014-09-05 LAB — CBC WITH DIFFERENTIAL/PLATELET
Basophils Absolute: 0 10*3/uL (ref 0.0–0.1)
Basophils Relative: 0 % (ref 0–1)
Eosinophils Absolute: 0.1 10*3/uL (ref 0.0–0.7)
Eosinophils Relative: 1 % (ref 0–5)
HCT: 41.5 % (ref 39.0–52.0)
Hemoglobin: 14.7 g/dL (ref 13.0–17.0)
LYMPHS ABS: 2 10*3/uL (ref 0.7–4.0)
LYMPHS PCT: 15 % (ref 12–46)
MCH: 35.5 pg — AB (ref 26.0–34.0)
MCHC: 35.4 g/dL (ref 30.0–36.0)
MCV: 100.2 fL — ABNORMAL HIGH (ref 78.0–100.0)
Monocytes Absolute: 1.3 10*3/uL — ABNORMAL HIGH (ref 0.1–1.0)
Monocytes Relative: 10 % (ref 3–12)
Neutro Abs: 10.1 10*3/uL — ABNORMAL HIGH (ref 1.7–7.7)
Neutrophils Relative %: 74 % (ref 43–77)
PLATELETS: 250 10*3/uL (ref 150–400)
RBC: 4.14 MIL/uL — ABNORMAL LOW (ref 4.22–5.81)
RDW: 13.9 % (ref 11.5–15.5)
WBC: 13.5 10*3/uL — ABNORMAL HIGH (ref 4.0–10.5)

## 2014-09-05 LAB — COMPREHENSIVE METABOLIC PANEL
ALT: 607 U/L — AB (ref 17–63)
AST: 288 U/L — AB (ref 15–41)
Albumin: 3.9 g/dL (ref 3.5–5.0)
Alkaline Phosphatase: 265 U/L — ABNORMAL HIGH (ref 38–126)
Anion gap: 9 (ref 5–15)
BUN: 38 mg/dL — ABNORMAL HIGH (ref 6–20)
CHLORIDE: 109 mmol/L (ref 101–111)
CO2: 17 mmol/L — ABNORMAL LOW (ref 22–32)
Calcium: 9 mg/dL (ref 8.9–10.3)
Creatinine, Ser: 1.33 mg/dL — ABNORMAL HIGH (ref 0.61–1.24)
GFR calc Af Amer: 58 mL/min — ABNORMAL LOW (ref >60)
GFR calc non Af Amer: 50 mL/min — ABNORMAL LOW (ref >60)
GLUCOSE: 155 mg/dL — AB (ref 65–99)
POTASSIUM: 4.1 mmol/L (ref 3.5–5.1)
Sodium: 135 mmol/L (ref 135–145)
Total Bilirubin: 8.5 mg/dL — ABNORMAL HIGH (ref 0.3–1.2)
Total Protein: 7.8 g/dL (ref 6.5–8.1)

## 2014-09-05 LAB — LIPID PANEL
CHOL/HDL RATIO: 8.4 ratio
Cholesterol: 253 mg/dL — ABNORMAL HIGH (ref 0–200)
HDL: 30 mg/dL — AB (ref >40)
LDL CALC: 157 mg/dL — AB (ref 0–99)
TRIGLYCERIDES: 330 mg/dL — AB (ref <150)
VLDL: 66 mg/dL — AB (ref 0–40)

## 2014-09-05 LAB — LIPASE, BLOOD: Lipase: >3000 U/L — ABNORMAL HIGH (ref 22–51)

## 2014-09-05 LAB — PROTIME-INR
INR: 1.03 (ref 0.00–1.49)
PROTHROMBIN TIME: 13.7 s (ref 11.6–15.2)

## 2014-09-05 LAB — BILIRUBIN, DIRECT: BILIRUBIN DIRECT: 5.5 mg/dL — AB (ref 0.1–0.5)

## 2014-09-05 LAB — I-STAT CG4 LACTIC ACID, ED: Lactic Acid, Venous: 0.68 mmol/L (ref 0.5–2.0)

## 2014-09-05 LAB — TROPONIN I: Troponin I: <0.03 ng/mL (ref <0.031)

## 2014-09-05 MED ORDER — MORPHINE SULFATE 4 MG/ML IJ SOLN
4.0000 mg | Freq: Once | INTRAMUSCULAR | Status: AC
  Administered 2014-09-05: 4 mg via INTRAVENOUS
  Filled 2014-09-05: qty 1

## 2014-09-05 MED ORDER — PANTOPRAZOLE SODIUM 40 MG IV SOLR
40.0000 mg | Freq: Once | INTRAVENOUS | Status: AC
  Administered 2014-09-05: 40 mg via INTRAVENOUS
  Filled 2014-09-05: qty 40

## 2014-09-05 MED ORDER — METOPROLOL TARTRATE 1 MG/ML IV SOLN
5.0000 mg | Freq: Four times a day (QID) | INTRAVENOUS | Status: DC
  Administered 2014-09-05 – 2014-09-07 (×8): 5 mg via INTRAVENOUS
  Filled 2014-09-05 (×8): qty 5

## 2014-09-05 MED ORDER — SODIUM CHLORIDE 0.9 % IV SOLN
1000.0000 mL | Freq: Once | INTRAVENOUS | Status: AC
  Administered 2014-09-05: 1000 mL via INTRAVENOUS

## 2014-09-05 MED ORDER — SODIUM CHLORIDE 0.9 % IV SOLN: INTRAVENOUS | Status: DC

## 2014-09-05 MED ORDER — ONDANSETRON HCL 4 MG PO TABS: 4.0000 mg | ORAL_TABLET | Freq: Four times a day (QID) | ORAL | Status: DC | PRN

## 2014-09-05 MED ORDER — PANTOPRAZOLE SODIUM 40 MG IV SOLR
40.0000 mg | Freq: Two times a day (BID) | INTRAVENOUS | Status: DC
  Administered 2014-09-06 – 2014-09-07 (×3): 40 mg via INTRAVENOUS
  Filled 2014-09-05 (×3): qty 40

## 2014-09-05 MED ORDER — PANTOPRAZOLE SODIUM 40 MG IV SOLR: 40.0000 mg | Freq: Two times a day (BID) | INTRAVENOUS | Status: DC

## 2014-09-05 MED ORDER — ONDANSETRON HCL 4 MG/2ML IJ SOLN
4.0000 mg | Freq: Once | INTRAMUSCULAR | Status: AC
  Administered 2014-09-05: 4 mg via INTRAVENOUS
  Filled 2014-09-05: qty 2

## 2014-09-05 MED ORDER — TRAZODONE HCL 50 MG PO TABS
25.0000 mg | ORAL_TABLET | Freq: Every evening | ORAL | Status: DC | PRN
  Administered 2014-09-05 – 2014-09-06 (×2): 25 mg via ORAL
  Filled 2014-09-05 (×2): qty 1

## 2014-09-05 MED ORDER — SODIUM CHLORIDE 0.9 % IV SOLN
INTRAVENOUS | Status: DC
  Administered 2014-09-05: 20:00:00 via INTRAVENOUS
  Administered 2014-09-05: 175 mL/h via INTRAVENOUS
  Administered 2014-09-06 – 2014-09-07 (×3): via INTRAVENOUS

## 2014-09-05 MED ORDER — SODIUM CHLORIDE 0.9 % IV SOLN
1000.0000 mL | INTRAVENOUS | Status: DC
  Administered 2014-09-05: 1000 mL via INTRAVENOUS

## 2014-09-05 MED ORDER — SODIUM CHLORIDE 0.9 % IJ SOLN
3.0000 mL | Freq: Two times a day (BID) | INTRAMUSCULAR | Status: DC
Start: 2014-09-05 — End: 2014-09-07
  Administered 2014-09-05 – 2014-09-07 (×3): 3 mL via INTRAVENOUS

## 2014-09-05 MED ORDER — MORPHINE SULFATE 2 MG/ML IJ SOLN
1.0000 mg | INTRAMUSCULAR | Status: DC | PRN
  Administered 2014-09-06: 1 mg via INTRAVENOUS
  Filled 2014-09-05: qty 1

## 2014-09-05 MED ORDER — ONDANSETRON HCL 4 MG/2ML IJ SOLN
4.0000 mg | Freq: Four times a day (QID) | INTRAMUSCULAR | Status: DC | PRN
  Administered 2014-09-05: 4 mg via INTRAVENOUS
  Filled 2014-09-05: qty 2

## 2014-09-05 NOTE — ED Provider Notes (Addendum)
CSN: [id number]     Arrival date & time 09/05/14  0505 History   First MD Initiated Contact with Patient 09/05/14 [phone]     Chief Complaint  Patient presents with  . Abdominal Pain     (Consider location/radiation/quality/duration/timing/severity/associated sxs/prior Treatment) Patient is a 79 y.o. male presenting with abdominal pain. The history is provided by the patient.  Abdominal Pain He comes in with a 2 day history of right upper quadrant pain with some radiation to the back. There is associated nausea but no vomiting. He denies diarrhea or constipation. Pain is severe and he rates it at 10/10. It is worse after he eats. Nothing makes it any better. He does have history of gastroesophageal reflux disease but has never had pain like this before. Pain has occasionally radiated to the chest but not to the shoulders. He denies NSAID use and is on chronic suppressive therapy for his GERD.  Past Medical History  Diagnosis Date  . Hypertension   . Gout   . Barrett's esophagus     last EGD/Bx 12/11  . Helicobacter pylori gastritis 2000    s/p treatment  . Adenomatous colon polyp 2010  . GERD (gastroesophageal reflux disease)   . GASTRITIS 09/23/2008    Qualifier: Diagnosis of  By: Nicole Kindred LPN, Doris     Past Surgical History  Procedure Laterality Date  . Esophagogastroduodenoscopy  2008    DUODENITIS & GASTRITIS 2o TO NSAIDS/ETOH  . Colonoscopy  2010    simple adenomas  . Neck surgery      DISC REPLACED  . Bladder surgery  8588,5027  . Sinus exploration  2000  . Tonsillectomy      AS A CHILD  . Esophagogastroduodenoscopy  12/2009    short segment Barrett's, small hh, chronic gastritis  . Nasal septum surgery    . Esophagogastroduodenoscopy  01/04/2011    XAJ:OINO gastritis/Barrett's, possible  . Esophagogastroduodenoscopy N/A 11/30/2012    Dr. Oneida Alar- normal esophagus, empiric dilation d/t c/o dysphagia/?cervical web, stomach= mild non erosive gastritis, inflammation on bx,  duodenum= no abnormalities in the bulb and second portion of the duodenum. dilation at the gastroesphageal junction.  Azzie Almas dilation N/A 11/30/2012    Procedure: SAVORY DILATION;  Surgeon: Danie Binder, MD;  [facility];  Service: Endoscopy;  Laterality: N/A;  Venia Minks dilation N/A 11/30/2012    Procedure: Venia Minks DILATION;  Surgeon: Danie Binder, MD;  Location: [facility];  Service: Endoscopy;  Laterality: N/A;  . 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 Colonoscopy N/A 07/28/2013    Procedure: COLONOSCOPY;  Surgeon: Danie Binder, MD;  Location: [facility];  Service: Endoscopy;  Laterality: N/A;  10:30  . Doppler echocardiography  2009  . Nm myoview ltd  2009   Family History  Problem Relation Age of Onset  . Stomach cancer Father 3    deceased  . Colon cancer Neg Hx    Social History  Substance Use Topics  . Smoking status: Former Smoker -- 3.00 packs/day    Types: Cigarettes  . Smokeless tobacco: Never Used     Comment: quit age 84 - 1 pack weekly   . Alcohol Use: 0.0 oz/week    .01 drink(s) per week     Comment:  Was drinking 1 pint of bourbon each weekend. Pt denies any alcohol use x 2 months on 07/28/2013    Review of Systems  Gastrointestinal: Positive for abdominal pain.  All other systems reviewed and are  negative.     Allergies  Doxycycline  Home Medications   Prior to Admission medications   Medication Sig Start Date End Date Taking? Authorizing Provider  allopurinol (ZYLOPRIM) 100 MG tablet Take 100 mg by mouth daily.      Historical Provider, MD  aspirin EC 81 MG tablet Take 81 mg by mouth 3 (three) times a week.    Historical Provider, MD  atenolol (TENORMIN) 25 MG tablet Take 25 mg by mouth daily.      Historical Provider, MD  dexlansoprazole (DEXILANT) 60 MG capsule Take 1 capsule (60 mg total) by mouth daily. 08/27/10 09/26/10  Andria Meuse, NP  dexlansoprazole (DEXILANT) 60 MG capsule 1 PO EVERY MORNING WITH BREAKFAST. 02/09/14    Danie Binder, MD  ranitidine (ZANTAC 150 MAXIMUM STRENGTH) 150 MG tablet Take 150 mg by mouth as needed for heartburn.    Historical Provider, MD   BP 140/93 mmHg  Pulse 102  Temp(Src) 97.8 F (36.6 C) (Oral)  Resp 16  Ht 5\' 9"  (1.753 m)  Wt 214 lb (97.07 kg)  BMI 31.59 kg/m2  SpO2 95% Physical Exam  Nursing note and vitals reviewed.  79 year old male, resting comfortably and in no acute distress. Vital signs are significant for hypertension and tachycardia. Oxygen saturation is 95%, which is normal. Head is normocephalic and atraumatic. PERRLA, EOMI. Oropharynx is clear. Neck is nontender and supple without adenopathy or JVD. Back is nontender and there is no CVA tenderness. Lungs are clear without rales, wheezes, or rhonchi. Chest is nontender. Heart has regular rate and rhythm without murmur. Abdomen is soft, flat, with moderate epigastric tenderness without rebound or guarding. There are no  masses or hepatosplenomegaly and peristalsis is hypoactive. Extremities have no cyanosis or edema, full range of motion is present. Skin is warm and dry without rash. Neurologic: Mental status is normal, cranial nerves are intact, there are no motor or sensory deficits.  ED Course  Procedures (including critical care time) Labs Review Results for orders placed or performed during the hospital encounter of 09/05/14  Comprehensive metabolic panel  Result Value Ref Range   Sodium 135 135 - 145 mmol/L   Potassium 4.1 3.5 - 5.1 mmol/L   Chloride 109 101 - 111 mmol/L   CO2 17 (L) 22 - 32 mmol/L   Glucose, Bld 155 (H) 65 - 99 mg/dL   BUN 38 (H) 6 - 20 mg/dL   Creatinine, Ser 1.33 (H) 0.61 - 1.24 mg/dL   Calcium 9.0 8.9 - 10.3 mg/dL   Total Protein 7.8 6.5 - 8.1 g/dL   Albumin 3.9 3.5 - 5.0 g/dL   AST 288 (H) 15 - 41 U/L   ALT 607 (H) 17 - 63 U/L   Alkaline Phosphatase 265 (H) 38 - 126 U/L   Total Bilirubin 8.5 (H) 0.3 - 1.2 mg/dL   GFR calc non Af Amer 50 (L) >60 mL/min   GFR calc  Af Amer 58 (L) >60 mL/min   Anion gap 9 5 - 15  CBC with Differential  Result Value Ref Range   WBC 13.5 (H) 4.0 - 10.5 K/uL   RBC 4.14 (L) 4.22 - 5.81 MIL/uL   Hemoglobin 14.7 13.0 - 17.0 g/dL   HCT 41.5 39.0 - 52.0 %   MCV 100.2 (H) 78.0 - 100.0 fL   MCH 35.5 (H) 26.0 - 34.0 pg   MCHC 35.4 30.0 - 36.0 g/dL   RDW 13.9 11.5 - 15.5 %   Platelets  250 150 - 400 K/uL   Neutrophils Relative % 74 43 - 77 %   Neutro Abs 10.1 (H) 1.7 - 7.7 K/uL   Lymphocytes Relative 15 12 - 46 %   Lymphs Abs 2.0 0.7 - 4.0 K/uL   Monocytes Relative 10 3 - 12 %   Monocytes Absolute 1.3 (H) 0.1 - 1.0 K/uL   Eosinophils Relative 1 0 - 5 %   Eosinophils Absolute 0.1 0.0 - 0.7 K/uL   Basophils Relative 0 0 - 1 %   Basophils Absolute 0.0 0.0 - 0.1 K/uL  Lipase, blood  Result Value Ref Range   Lipase >3000 (H) 22 - 51 U/L  Troponin I  Result Value Ref Range   Troponin I <0.03 <0.031 ng/mL  I-Stat CG4 Lactic Acid, ED  Result Value Ref Range   Lactic Acid, Venous 0.68 0.5 - 2.0 mmol/L   I, Barclay Lennox, personally reviewed and evaluated these lab results as part of my medical decision-making.   EKG Interpretation   Date/Time:  Monday September 05 2014 05:58:26 EDT Ventricular Rate:  95 PR Interval:  169 QRS Duration: 76 QT Interval:  342 QTC Calculation: 430 R Axis:   -16 Text Interpretation:  Sinus rhythm Borderline left axis deviation  Otherwise within normal limits No old tracing to compare Confirmed by  [facility]  MD, Cristopher Ciccarelli ([id number]) on 09/05/2014 6:00:54 AM      MDM   Final diagnoses:  RUQ abdominal pain  Acute pancreatitis, unspecified pancreatitis type  Elevated liver enzymes  Elevated bilirubin  Renal insufficiency   Upper abdominal pain. With his history, I suspect it is related to GERD. Old records are reviewed showing gastroenterology office visits for GERD, but no abdominal imaging. He will be given IV fluids, ondansetron, morphine and screening labs obtained.  Laboratory workup shows  evidence of probable biliary obstruction. Lipase is greater than 3000 and transaminases are markedly elevated and alkaline phosphatase moderately elevated. Bilirubin is extremely high at 8.5. Metabolic acidosis is also noted although anion gap is normal. Mild renal insufficiency is present and unchanged from baseline. He feels much better after IV fluids and morphine, and ondansetron, pantoprazole. At this point, I suspect that he has gallstone pancreatitis. He is being sent for ultrasound and will need admission following ultrasound. Case is discussed with Dr. Jerilee Hoh of [facility] who agrees to admit the patient.  Delora Fuel, MD 37/35/78 [id number]  Delora Fuel, MD 78/41/28 [id number]

## 2014-09-05 NOTE — ED Notes (Signed)
Pt c/o abd pain with nausea that started two days ago,  

## 2014-09-05 NOTE — Plan of Care (Signed)
Problem: Consults Goal: Pancreatitis Patient Education See Patient Education Module for education specifics. Outcome: Progressing Handouts given to patient on DX and discussed with him.

## 2014-09-05 NOTE — Consult Note (Signed)
Referring Provider: Dyanne Carrel, NP/Dr. Jerilee Hoh Primary Care Physician:  [name], MD Primary Gastroenterologist:  Dr. Oneida Alar   Date of Admission: 09/05/14 Date of Consultation: 09/05/14 Reason for Consultation:  Pancreatitis   HPI:  William Rivera is a 79 y.o. year old male who was admitted with acute pancreatitis, lipase greater than 3000, elevated LFTs with Tbili 8.5, direct bilirubin 5.5. Ultrasound abdomen ordered but not completed.   Noted diffuse abdominal pain, onset Saturday morning. Mild nausea but no vomiting. No fever or chills. No prior episodes. Gallbladder remains in situ. Drinks about a pint of liquor per week but mainly on weekends. No changes in medications; states he was given antibiotics by his PCP recently (doxycycline) but had associate pruritis. Pain eased off slightly last night, drank soda, then started up again.   Takes generic omeprazole, occasional Zantac. No dysphagia. No lower GI symptoms. Last seen by our practice Jan 2016. Stress test normal Jan 2016 with EF 60%.   Past Medical History  Diagnosis Date  . Hypertension   . Gout   . Barrett's esophagus     last EGD/Bx 12/11  . Helicobacter pylori gastritis 2000    s/p treatment  . Adenomatous colon polyp 2010  . GERD (gastroesophageal reflux disease)   . GASTRITIS 09/23/2008    Qualifier: Diagnosis of  By: Nicole Kindred LPN, Doris      Past Surgical History  Procedure Laterality Date  . Esophagogastroduodenoscopy  2008    DUODENITIS & GASTRITIS 2o TO NSAIDS/ETOH  . Colonoscopy  2010    simple adenomas  . Neck surgery      DISC REPLACED  . Bladder surgery  [id number]  . Sinus exploration  2000  . Tonsillectomy      AS A CHILD  . Esophagogastroduodenoscopy  12/2009    short segment Barrett's, small hh, chronic gastritis  . Nasal septum surgery    . Esophagogastroduodenoscopy  01/04/2011    OVZ:CHYI gastritis/Barrett's, possible  . Esophagogastroduodenoscopy N/A 11/30/2012    Dr. Oneida Alar-  normal esophagus, empiric dilation d/t c/o dysphagia/?cervical web, stomach= mild non erosive gastritis, inflammation on bx, duodenum= no abnormalities in the bulb and second portion of the duodenum. dilation at the gastroesphageal junction.  Azzie Almas dilation N/A 11/30/2012    Procedure: SAVORY DILATION;  Surgeon: Danie Binder, MD;  Location: [facility];  Service: Endoscopy;  Laterality: N/A;  Venia Minks dilation N/A 11/30/2012    Procedure: Venia Minks DILATION;  Surgeon: Danie Binder, MD;  [facility]: [facility];  Service: Endoscopy;  Laterality: N/A;  . 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 Colonoscopy N/A 07/28/2013    Dr. Oneida Alar: tubular adenoma, mild sigmoid colon diverticulosis, internal hemorhhoids   . Doppler echocardiography  2009  . Nm myoview ltd  2009    Prior to Admission medications   Medication Sig Start Date End Date Taking? Authorizing Provider  allopurinol (ZYLOPRIM) 100 MG tablet Take 100 mg by mouth daily.      Historical Provider, MD  aspirin EC 81 MG tablet Take 81 mg by mouth 3 (three) times a week.    Historical Provider, MD  atenolol (TENORMIN) 25 MG tablet Take 25 mg by mouth daily.      Historical Provider, MD  dexlansoprazole (DEXILANT) 60 MG capsule Take 1 capsule (60 mg total) by mouth daily. 08/27/10 09/26/10  Andria Meuse, NP  dexlansoprazole (DEXILANT) 60 MG capsule 1 PO EVERY MORNING WITH BREAKFAST. 02/09/14   Danie Binder, MD  ranitidine (  ZANTAC 150 MAXIMUM STRENGTH) 150 MG tablet Take 150 mg by mouth as needed for heartburn.    Historical Provider, MD    Current Facility-Administered Medications  Medication Dose Route Frequency Provider Last Rate Last Dose  . 0.9 %  sodium chloride infusion   Intravenous Continuous Lezlie Octave Black, NP      . metoprolol (LOPRESSOR) injection 5 mg  5 mg Intravenous 4 times per day Radene Gunning, NP      . morphine 2 MG/ML injection 1 mg  1 mg Intravenous Q2H PRN Radene Gunning, NP      . ondansetron [facility]) tablet 4 mg   4 mg Oral Q6H PRN Radene Gunning, NP       Or  . ondansetron [facility]) injection 4 mg  4 mg Intravenous Q6H PRN Radene Gunning, NP      . pantoprazole (PROTONIX) injection 40 mg  40 mg Intravenous Q12H Lezlie Octave Black, NP      . sodium chloride 0.9 % injection 3 mL  3 mL Intravenous Q12H Lezlie Octave Black, NP      . traZODone (DESYREL) tablet 25 mg  25 mg Oral QHS PRN Radene Gunning, NP        Allergies as of 09/05/2014 - Review Complete 09/05/2014  Allergen Reaction Noted  . Doxycycline  09/05/2014    Family History  Problem Relation Age of Onset  . Stomach cancer Father 70    deceased  . Colon cancer Neg Hx     Social History   Social History  . Marital Status: Married    Spouse Name: N/A  . Number of Children: 1  . Years of Education: N/A   Occupational History  . truck driver    Social History Main Topics  . Smoking status: Former Smoker -- 3.00 packs/day    Types: Cigarettes  . Smokeless tobacco: Never Used     Comment: quit age 35 - 1 pack weekly   . Alcohol Use: 0.0 oz/week    0 Standard drinks or equivalent per week     Comment: drinks about a pint of borboun per week  . Drug Use: No  . Sexual Activity:    Partners: Female    Museum/gallery curator: None   Other Topics Concern  . Not on file   Social History Narrative    Review of Systems: Gen: Denies fever, chills, loss of appetite, change in weight or weight loss CV: Denies chest pain, heart palpitations, syncope, edema  Resp: Denies shortness of breath with rest, cough, wheezing GI: see HPI GU : dark urine  MS: +arthritis  Derm: Denies rash, itching, dry skin Psych: Denies depression, anxiety,confusion, or memory loss Heme: Denies bruising, bleeding, and enlarged lymph nodes.  Physical Exam: Vital signs in last 24 hours: Temp:  [97.8 F (36.6 C)-97.9 F (36.6 C)] 97.9 F (36.6 C) (08/15 0910) Pulse Rate:  [85-102] 85 (08/15 0910) Resp:  [16-18] 18 (08/15 0910) BP: (128-140)/(71-93) 128/71 mmHg  (08/15 0910) SpO2:  [93 %-95 %] 93 % (08/15 0910) Weight:  [202 lb 14.4 oz (92.035 kg)-214 lb (97.07 kg)] 202 lb 14.4 oz (92.035 kg) (08/15 0910)   General:   Alert,  Well-developed, well-nourished, pleasant and cooperative in NAD, mild jaundice Head:  Normocephalic and atraumatic. Eyes:  Mild scleral icterus Ears:  Normal auditory acuity. Nose:  No deformity, discharge,  or lesions. Mouth:  No deformity or lesions, dentition normal. Lungs:  Clear throughout to auscultation.  No wheezes, crackles, or rhonchi. No acute distress. Heart:  S1 S2 present without murmurs Abdomen:  Soft, moderate TTP epigastric and RUQ, nondistended. No masses, hepatosplenomegaly or hernias noted. Normal bowel sounds, without guarding, and without rebound.   Rectal:  Deferred  Msk:  Symmetrical without gross deformities. Normal posture. Extremities:  Without  edema. Neurologic:  Alert and  oriented x4;  grossly normal neurologically. Skin:  Intact without significant lesions or rashes. Psych:  Alert and cooperative. Normal mood and affect.  Intake/Output from previous day:   Intake/Output this shift:    Lab Results:  Recent Labs  09/05/14 0530  WBC 13.5*  HGB 14.7  HCT 41.5  PLT 250   BMET  Recent Labs  09/05/14 0530  NA 135  K 4.1  CL 109  CO2 17*  GLUCOSE 155*  BUN 38*  CREATININE 1.33*  CALCIUM 9.0   LFT  Recent Labs  09/05/14 0530 09/05/14 0710  PROT 7.8  --   ALBUMIN 3.9  --   AST 288*  --   ALT 607*  --   ALKPHOS 265*  --   BILITOT 8.5*  --   BILIDIR  --  5.5*   Lab Results  Component Value Date   LIPASE >3000* 09/05/2014    Studies/Results: Ultrasound ordered  Impression: 79 year old male admitted with acute pancreatitis, likely biliary but unable to exclude ETOH as a contributor, as he drinks a pint of bourbon weekly. Elevated LFTs noted, with ultrasound abdomen ordered but not completed at time of consultation. Needs IV hydration, supportive measures, and  ultrasound completed. As of note, no cardiac contraindication to IV hydration. Appears may have underlying chronic renal disease. Recommended guidelines for fluid resuscitation is 5-31ml/kg/hour. However, with likely chronic kidney disease, will modify fluids at a lesser rate with more cautious replacement and will monitor hydration status closely. Remain NPO for now and await ultrasound findings. Will also add PT/INR to calculate discriminant function.   Plan: Ultrasound as ordered Remain NPO Add PT/INR Increase IV fluids to 175cc/hour for now, monitoring closely.  Repeat LFTs, lipase in am Further recommendations after [place] completed    Orvil Feil, ANP-BC [facility]     LOS: 0 days    09/05/2014, 9:43 AM

## 2014-09-05 NOTE — H&P (Signed)
[facility] History and Physical  William Rivera SJG:[id number] DOB: 29-Nov-1935 DOA: 09/05/2014  Referring physician: Roxanne Mins PCP: [name], MD   Chief Complaint: abdominal pain  HPI: William Rivera is a very pleasant  79 y.o. male with a past medical history hypertension, gout, GERD, Barrett's esophagus, H pylori gastritis EtOH use presents to the emergency room with chief complaint of abdominal pain. Initial evaluation concerning for biliary obstruction and gallstone pancreatitis. Patient reports developing sudden sharp abdominal pain characterized as constant 2 days ago. Pain is located mostly in the upper right quadrant with some radiation to the flank area. Associated symptoms include nausea but no vomiting. He rates the pain a 10 out of 10 and states that eating makes it worse. He reports nothing makes it better. He denies chest pain palpitation shortness of breath headache dizziness syncope or near-syncope. He denies any dysuria hematuria frequency or urgency but does report that his urine has been "quite dark" lately. He denies abdominal distention diarrhea constipation melanoma. Workup in the emergency department significant for creatinine of 1.3 alkaline phosphatase 265 lipase greater than 3000 AST 288 ALT 607 total bilirubin 8.5. Lactic acid within the limits of normal WBCs 13.5 and initial troponin is negative. In the emergency department he's afebrile hemodynamically stable and not hypoxic. He is provided with 1 L of normal saline 4 mg morphine 4 mg of Zofran and 40 mg of Protonix.   Review of Systems:  10 point review of systems completed and all systems are negative except as indicated in the history of present illness   Past Medical History  Diagnosis Date  . Hypertension   . Gout   . Barrett's esophagus     last EGD/Bx 12/11  . Helicobacter pylori gastritis 2000    s/p treatment  . Adenomatous colon polyp 2010  . GERD (gastroesophageal reflux disease)   .  GASTRITIS 09/23/2008    Qualifier: Diagnosis of  By: Nicole Kindred LPN, Doris     Past Surgical History  Procedure Laterality Date  . Esophagogastroduodenoscopy  2008    DUODENITIS & GASTRITIS 2o TO NSAIDS/ETOH  . Colonoscopy  2010    simple adenomas  . Neck surgery      DISC REPLACED  . Bladder surgery  6546,[id number]  . Sinus exploration  2000  . Tonsillectomy      AS A CHILD  . Esophagogastroduodenoscopy  12/2009    short segment Barrett's, small hh, chronic gastritis  . Nasal septum surgery    . Esophagogastroduodenoscopy  01/04/2011    WSF:KCLE gastritis/Barrett's, possible  . Esophagogastroduodenoscopy N/A 11/30/2012    Dr. Oneida Alar- normal esophagus, empiric dilation d/t c/o dysphagia/?cervical web, stomach= mild non erosive gastritis, inflammation on bx, duodenum= no abnormalities in the bulb and second portion of the duodenum. dilation at the gastroesphageal junction.  Azzie Almas dilation N/A 11/30/2012    Procedure: SAVORY DILATION;  Surgeon: Danie Binder, MD;  Location: [facility];  Service: Endoscopy;  Laterality: N/A;  Venia Minks dilation N/A 11/30/2012    Procedure: Venia Minks DILATION;  Surgeon: Danie Binder, MD;  Location: [facility];  Service: Endoscopy;  Laterality: N/A;  . 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 Colonoscopy N/A 07/28/2013    Procedure: COLONOSCOPY;  Surgeon: Danie Binder, MD;  Location: [facility];  Service: Endoscopy;  Laterality: N/A;  10:30  . Doppler echocardiography  2009  . Nm myoview ltd  2009   Social History:  reports that he has quit smoking.  His smoking use included Cigarettes. He smoked 3.00 packs per day. He has never used smokeless tobacco. He reports that he drinks alcohol. He reports that he does not use illicit drugs. 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 Doxycycline     rash    Family History  Problem  Relation Age of Onset  . Stomach cancer Father 23    deceased  . Colon cancer Neg Hx      Prior to Admission medications   Medication Sig Start Date End Date Taking? Authorizing Provider  allopurinol (ZYLOPRIM) 100 MG tablet Take 100 mg by mouth daily.      Historical Provider, MD  aspirin EC 81 MG tablet Take 81 mg by mouth 3 (three) times a week.    Historical Provider, MD  atenolol (TENORMIN) 25 MG tablet Take 25 mg by mouth daily.      Historical Provider, MD  dexlansoprazole (DEXILANT) 60 MG capsule Take 1 capsule (60 mg total) by mouth daily. 08/27/10 09/26/10  Andria Meuse, NP  dexlansoprazole (DEXILANT) 60 MG capsule 1 PO EVERY MORNING WITH BREAKFAST. 02/09/14   Danie Binder, MD  ranitidine (ZANTAC 150 MAXIMUM STRENGTH) 150 MG tablet Take 150 mg by mouth as needed for heartburn.    Historical Provider, MD   Physical Exam: Filed Vitals:   09/05/14 0512 09/05/14 0730 09/05/14 0910  BP: 140/93 132/78 128/71  Pulse: 102 88 85  Temp: 97.8 F (36.6 C)  97.9 F (36.6 C)  TempSrc: Oral  Oral  Resp: 16 18 18   Height: 5\' 9"  (1.753 m)  5\' 9"  (1.753 m)  Weight: 97.07 kg (214 lb)  92.035 kg (202 lb 14.4 oz)  SpO2: 95% 94% 93%    Wt Readings from Last 3 Encounters:  09/05/14 92.035 kg (202 lb 14.4 oz)  02/09/14 96.344 kg (212 lb 6.4 oz)  01/26/14 97.977 kg (216 lb)    General:  Appears calm and comfortable Eyes: PERRL, normal lids, irises some scleral icterus. ENT: grossly normal hearing, mucous membranes of his mouth are pink but dry Neck: no LAD, masses or thyromegaly Cardiovascular: RRR, no m/r/g. No LE edema.  Respiratory: CTA bilaterally, no w/r/r. Normal respiratory effort. Abdomen: soft, mild tenderness particularly in the lower left quadrant. No rebounding or guarding sluggish bowel sounds Skin: no rash or induration seen on limited exam Musculoskeletal: grossly normal tone BUE/BLE Psychiatric: grossly normal mood and affect, speech fluent and appropriate Neurologic:  grossly non-focal. Speech clear facial symmetry           Labs on Admission:  Basic Metabolic Panel:  Recent Labs Lab 09/05/14 0530  NA 135  K 4.1  CL 109  CO2 17*  GLUCOSE 155*  BUN 38*  CREATININE 1.33*  CALCIUM 9.0   Liver Function Tests:  Recent Labs Lab 09/05/14 0530  AST 288*  ALT 607*  ALKPHOS 265*  BILITOT 8.5*  PROT 7.8  ALBUMIN 3.9    Recent Labs Lab 09/05/14 0530  LIPASE >3000*   No results for input(s): AMMONIA in the last 168 hours. CBC:  Recent Labs Lab 09/05/14 0530  WBC 13.5*  NEUTROABS 10.1*  HGB 14.7  HCT 41.5  MCV 100.2*  PLT 250   Cardiac Enzymes:  Recent Labs Lab 09/05/14 0530  TROPONINI <0.03    BNP (last 3 results) No results for input(s): BNP in  the last 8760 hours.  ProBNP (last 3 results) No results for input(s): PROBNP in the last 8760 hours.  CBG: No results for input(s): GLUCAP in the last 168 hours.  Radiological Exams on Admission: No results found.  EKG: Independently reviewed NSR  Assessment/Plan Principal Problem:   Pancreatitis, acute: likely related to biliary obstruction. Admit. Lipase >3000. Provide vigorous IV fluids rate to be determined by GI. NPO, supportive therapy. Continue PPI. Await abdominal US and GI recommendation. Will likely need ERCP Active Problems: Elevated transaminase level/obstructive jaundice: related to biliary obstruction in setting of ETOH abuse.  Total bilirubin 8.5. Await GI recommendations. monitor    Essential hypertension: on BB at home. Will continue IV with parameters    GERD: home meds include dexilant. Continue PPI. Hx barretts esophagus.       Abdominal pain: see above    Acute renal failure: hx ckd stage II. Reports being released from nephrology care due to normalized creatine. Chart review reveals current creatinine close to baseline    Hyperglycemia: no hx of same. Will monitor.    Code Status: full DVT Prophylaxis: Family Communication: wife at  bedside Disposition Plan: home when ready  Time spent: 19 minute  [facility] Pager [phone]

## 2014-09-05 NOTE — ED Notes (Signed)
MD at bedside. 

## 2014-09-06 ENCOUNTER — Inpatient Hospital Stay (HOSPITAL_COMMUNITY): Payer: Medicare Other

## 2014-09-06 DIAGNOSIS — K858 Other acute pancreatitis: Secondary | ICD-10-CM

## 2014-09-06 LAB — COMPREHENSIVE METABOLIC PANEL
ALT: 391 U/L — ABNORMAL HIGH (ref 17–63)
ANION GAP: 5 (ref 5–15)
AST: 154 U/L — ABNORMAL HIGH (ref 15–41)
Albumin: 3.1 g/dL — ABNORMAL LOW (ref 3.5–5.0)
Alkaline Phosphatase: 218 U/L — ABNORMAL HIGH (ref 38–126)
BUN: 27 mg/dL — ABNORMAL HIGH (ref 6–20)
CHLORIDE: 111 mmol/L (ref 101–111)
CO2: 23 mmol/L (ref 22–32)
CREATININE: 1.04 mg/dL (ref 0.61–1.24)
Calcium: 8.7 mg/dL — ABNORMAL LOW (ref 8.9–10.3)
Glucose, Bld: 115 mg/dL — ABNORMAL HIGH (ref 65–99)
POTASSIUM: 4.8 mmol/L (ref 3.5–5.1)
Sodium: 139 mmol/L (ref 135–145)
Total Bilirubin: 10.5 mg/dL — ABNORMAL HIGH (ref 0.3–1.2)
Total Protein: 6.5 g/dL (ref 6.5–8.1)

## 2014-09-06 LAB — CBC
HEMATOCRIT: 37.4 % — AB (ref 39.0–52.0)
Hemoglobin: 12.4 g/dL — ABNORMAL LOW (ref 13.0–17.0)
MCH: 34.3 pg — ABNORMAL HIGH (ref 26.0–34.0)
MCHC: 33.2 g/dL (ref 30.0–36.0)
MCV: 103.3 fL — AB (ref 78.0–100.0)
PLATELETS: 203 10*3/uL (ref 150–400)
RBC: 3.62 MIL/uL — AB (ref 4.22–5.81)
RDW: 14.1 % (ref 11.5–15.5)
WBC: 9.7 10*3/uL (ref 4.0–10.5)

## 2014-09-06 LAB — HEPATITIS PANEL, ACUTE
HCV Ab: <0.1 s/co ratio (ref 0.0–0.9)
Hep A IgM: NEGATIVE
Hep B C IgM: NEGATIVE
Hepatitis B Surface Ag: NEGATIVE

## 2014-09-06 LAB — PROTIME-INR
INR: 1.12 (ref 0.00–1.49)
Prothrombin Time: 14.6 seconds (ref 11.6–15.2)

## 2014-09-06 LAB — LIPASE, BLOOD: Lipase: 2258 U/L — ABNORMAL HIGH (ref 22–51)

## 2014-09-06 MED ORDER — GADOBENATE DIMEGLUMINE 529 MG/ML IV SOLN
19.0000 mL | Freq: Once | INTRAVENOUS | Status: AC | PRN
  Administered 2014-09-06: 19 mL via INTRAVENOUS

## 2014-09-06 NOTE — Progress Notes (Signed)
[facility] PROGRESS NOTE  William Rivera VXB:[id number] DOB: 06/30/1935 DOA: 09/05/2014 PCP: [name], MD  Assessment/Plan: Pancreatitis, acute: likely related to biliary obstruction but concern for ETOH hepatitis. Abdominal US with Gallbladder distention but no findings for acute cholecystitis. Dilated common bile duct measuring a maximum of 11 mm. This could be due to acute pancreatitis. No obvious common bile duct stone. Heterogeneous appearance of the pancreas consistent with acute pancreatitis. Diffuse fatty infiltration of the liver. Scarring changes involving the left kidney.  Lipase pending. MRCP today per GI. continue vigorous IV fluids. Remains NPO. Continue PPI. Will eventully need surgical consult  Active Problems: Elevated transaminase level/obstructive jaundice: related to biliary obstruction in setting of ETOH abuse. Total bilirubin trending up but other levels trending down slightly. appreciate GI recommendations. monitor   Essential hypertension: stable.  on BB at home. Will continue IV with parameters   GERD: home meds include dexilant. Continue PPI. Hx barretts esophagus.     Abdominal pain: much improved this am   Acute renal failure: hx ckd stage II. Reports being released from nephrology care due to normalized creatine. Chart review reveals current creatinine close to baseline. Continue to monitor   Hyperglycemia: no hx of same. Much improved this am. Will monitor.   Code Status: full Family Communication: wife at bedside Disposition Plan: home when ready   Consultants:  GI  Procedures:  none  Antibiotics:  none  HPI/Subjective: Lying in bed reports fairly good night. Needed no pain med during night  Objective: Filed Vitals:   09/06/14 0454  BP: 122/57  Pulse: 79  Temp: 98.4 F (36.9 C)  Resp: 18    Intake/Output Summary (Last 24 hours) at 09/06/14 0923 Last data filed at 09/06/14 0800  Gross per 24 hour  Intake  2213.75 ml  Output      0 ml  Net 2213.75 ml   Filed Weights   09/05/14 0512 09/05/14 0910 09/06/14 0519  Weight: 97.07 kg (214 lb) 92.035 kg (202 lb 14.4 oz) 93.305 kg (205 lb 11.2 oz)    Exam:   General:  Well nourished appears comfortable  Cardiovascular: s1 and s2 no m/g/r no LE edema  Respiratory: normal effort BS clear bilaterally no wheeze  Abdomen: non-distended only mild diffuse tenderness sluggish BS  Musculoskeletal: joints without swelling/erythema   Data Reviewed: Basic Metabolic Panel:  Recent Labs Lab 09/05/14 0530 09/06/14 0622  NA 135 139  K 4.1 4.8  CL 109 111  CO2 17* 23  GLUCOSE 155* 115*  BUN 38* 27*  CREATININE 1.33* 1.04  CALCIUM 9.0 8.7*   Liver Function Tests:  Recent Labs Lab 09/05/14 0530 09/06/14 0622  AST 288* 154*  ALT 607* 391*  ALKPHOS 265* 218*  BILITOT 8.5* 10.5*  PROT 7.8 6.5  ALBUMIN 3.9 3.1*    Recent Labs Lab 09/05/14 0530  LIPASE >3000*   No results for input(s): AMMONIA in the last 168 hours. CBC:  Recent Labs Lab 09/05/14 0530 09/06/14 0622  WBC 13.5* 9.7  NEUTROABS 10.1*  --   HGB 14.7 12.4*  HCT 41.5 37.4*  MCV 100.2* 103.3*  PLT 250 203   Cardiac Enzymes:  Recent Labs Lab 09/05/14 0530  TROPONINI <0.03   BNP (last 3 results) No results for input(s): BNP in the last 8760 hours.  ProBNP (last 3 results) No results for input(s): PROBNP in the last 8760 hours.  CBG: No results for input(s): GLUCAP in the last 168 hours.  No results found  for this or any previous visit (from the past 240 hour(s)).   Studies: US Abdomen Complete  09/05/2014   CLINICAL DATA:  Abdominal pain for 3 days. Elevated liver function studies.  EXAM: ULTRASOUND ABDOMEN COMPLETE  COMPARISON:  06/15/2013  FINDINGS: Gallbladder: Moderate gallbladder distention but no wall thickening, pericholecystic fluid or sonographic Murphy sign. No gallstones.  Common bile duct: Diameter: 11 mm  Liver: There is diffuse increased  echogenicity of the liver and decreased through transmission consistent with fatty infiltration. No focal lesions or biliary dilatation. Stable small cyst near the gallbladder fossa.  IVC: Normal caliber  Pancreas: Heterogeneous echogenicity and poorly defined soft tissue planes around the pancreas. Findings most consistent with acute pancreatitis which would correlate with the patient's markedly elevated lipase level.  Spleen: Normal size and echogenicity without focal lesions.  Right Kidney: Length: 10.0 cm. Normal renal cortical thickness and echogenicity without hydronephrosis. A simple appearing 17 mm cyst is noted.  Left Kidney: Length: 8.2 cm. Nodular irregular contour or and mild renal cortical thinning. No hydronephrosis.  Abdominal aorta: Normal caliber  Other findings: None.  IMPRESSION: 1. Gallbladder distention but no findings for acute cholecystitis. 2. Dilated common bile duct measuring a maximum of 11 mm. This could be due to acute pancreatitis. No obvious common bile duct stone. MRCP may be helpful for further evaluation if clinically indicated. 3. Heterogeneous appearance of the pancreas consistent with acute pancreatitis. 4. Diffuse fatty infiltration of the liver. 5. Scarring changes involving the left kidney.   Electronically Signed   By: Marijo Sanes M.D.   On: 09/05/2014 11:27    Scheduled Meds: . metoprolol  5 mg Intravenous 4 times per day  . pantoprazole (PROTONIX) IV  40 mg Intravenous BID  . sodium chloride  3 mL Intravenous Q12H   Continuous Infusions: . sodium chloride 175 mL/hr at 09/06/14 1751    Principal Problem:   Pancreatitis, acute Active Problems:   Essential hypertension   GERD   Hyperlipidemia   Obstructive jaundice   Elevated transaminase level   Abdominal pain   Acute renal failure   Hyperglycemia   Pancreatitis   Metabolic acidosis   Elevated liver enzymes   Acute pancreatitis    Time spent: 30 minutes    [facility] Pager [phone]. If 7PM-7AM, please contact night-coverage at [email or web address], password [facility] 09/06/2014, 9:23 AM  LOS: 1 day

## 2014-09-06 NOTE — Care Management Note (Signed)
Case Management Note  Patient Details  Name: William Rivera MRN: [id number] Date of Birth: Feb 14, 1935  Subjective/Objective:                  Pt admitted from home with pancreatitis. Pt lives with his wife and will return home at discharge. Pt is independent with ADl's.  Action/Plan: No CM needs anticipated.  Expected Discharge Date:   09/08/14               Expected Discharge Plan:  Home/Self Care  In-House Referral:  NA  Discharge planning Services  CM Consult  Post Acute Care Choice:  NA Choice offered to:  NA  DME Arranged:    DME Agency:     HH Arranged:    HH Agency:     Status of Service:  Completed, signed off  Medicare Important Message Given:    Date Medicare IM Given:    Medicare IM give by:    Date Additional Medicare IM Given:    Additional Medicare Important Message give by:     If discussed at [place] of Stay Meetings, dates discussed:    Additional Comments:  Joylene Draft, RN 09/06/2014, 11:32 AM

## 2014-09-06 NOTE — Progress Notes (Addendum)
Subjective: No pain overnight but recurrent epigastric burning this morning. No N/V.   Objective: Vital signs in last 24 hours: Temp:  [97.9 F (36.6 C)-98.5 F (36.9 C)] 98.4 F (36.9 C) (08/16 0454) Pulse Rate:  [68-85] 79 (08/16 0454) Resp:  [18] 18 (08/16 0454) BP: (120-129)/(57-81) 122/57 mmHg (08/16 0454) SpO2:  [93 %-95 %] 94 % (08/16 0454) Weight:  [202 lb 14.4 oz (92.035 kg)-205 lb 11.2 oz (93.305 kg)] 205 lb 11.2 oz (93.305 kg) (08/16 0519) Last BM Date: 09/04/14 General:   Alert and oriented, pleasant, jaundiced Head:  Normocephalic and atraumatic. Eyes:  +scleral icterus Mouth:  Without lesions, mucosa pink and moist.  Abdomen:  Bowel sounds present, soft, mild/improved tenderness to palpation epigastric/RUQ from yesterday. No rebound or guarding.  Extremities:  Without  edema. Neurologic:  Alert and  oriented x4;  grossly normal neurologically. Psych:  Alert and cooperative. Normal mood and affect.  Intake/Output from previous day:   Intake/Output this shift: Total I/O In: 2213.8 [I.V.:2213.8] Out: -   Lab Results:  Recent Labs  09/05/14 0530 09/06/14 0622  WBC 13.5* 9.7  HGB 14.7 12.4*  HCT 41.5 37.4*  PLT 250 203   BMET  Recent Labs  09/05/14 0530 09/06/14 0622  NA 135 139  K 4.1 4.8  CL 109 111  CO2 17* 23  GLUCOSE 155* 115*  BUN 38* 27*  CREATININE 1.33* 1.04  CALCIUM 9.0 8.7*   LFT  Recent Labs  09/05/14 0530 09/05/14 0710 09/06/14 0622  PROT 7.8  --  6.5  ALBUMIN 3.9  --  3.1*  AST 288*  --  154*  ALT 607*  --  391*  ALKPHOS 265*  --  218*  BILITOT 8.5*  --  10.5*  BILIDIR  --  5.5*  --    PT/INR  Recent Labs  09/05/14 0530  LABPROT 13.7  INR 1.03   Hepatitis Panel  Recent Labs  09/05/14 1704  HEPBSAG Negative  HCVAB <0.1  HEPAIGM Negative  HEPBIGM Negative     Studies/Results: US Abdomen Complete  09/05/2014   CLINICAL DATA:  Abdominal pain for 3 days. Elevated liver function studies.  EXAM:  ULTRASOUND ABDOMEN COMPLETE  COMPARISON:  06/15/2013  FINDINGS: Gallbladder: Moderate gallbladder distention but no wall thickening, pericholecystic fluid or sonographic Murphy sign. No gallstones.  Common bile duct: Diameter: 11 mm  Liver: There is diffuse increased echogenicity of the liver and decreased through transmission consistent with fatty infiltration. No focal lesions or biliary dilatation. Stable small cyst near the gallbladder fossa.  IVC: Normal caliber  Pancreas: Heterogeneous echogenicity and poorly defined soft tissue planes around the pancreas. Findings most consistent with acute pancreatitis which would correlate with the patient's markedly elevated lipase level.  Spleen: Normal size and echogenicity without focal lesions.  Right Kidney: Length: 10.0 cm. Normal renal cortical thickness and echogenicity without hydronephrosis. A simple appearing 17 mm cyst is noted.  Left Kidney: Length: 8.2 cm. Nodular irregular contour or and mild renal cortical thinning. No hydronephrosis.  Abdominal aorta: Normal caliber  Other findings: None.  IMPRESSION: 1. Gallbladder distention but no findings for acute cholecystitis. 2. Dilated common bile duct measuring a maximum of 11 mm. This could be due to acute pancreatitis. No obvious common bile duct stone. MRCP may be helpful for further evaluation if clinically indicated. 3. Heterogeneous appearance of the pancreas consistent with acute pancreatitis. 4. Diffuse fatty infiltration of the liver. 5. Scarring changes involving the left kidney.  Electronically Signed   By: Marijo Sanes M.D.   On: 09/05/2014 11:27    Assessment: 79 year old male admitted with likely biliary pancreatitis, elevated LFTs, and ultrasound inconclusive for CBD stones. Bilirubin increasing today but transaminases improving. Clinically and symptom-wise seems to be improving from admission. Creatinine normalized from admission. As of note, hepatitis panel negative and triglycerides  elevated in the 300 range but not enough to be a contributor. Needs MRCP to exclude choledocholithiasis. As of note, drinks bourbon weekly but discriminant function 18 yesterday. Recheck INR today; however, DF likely will not be greater than 32.   Plan: MRCP today  Recheck INR Remain NPO Follow-up on pending lipase Continue IV hydration at 175 (history of chronic kidney disease, will monitor closely) Consider elective cholecystectomy in future   Orvil Feil, ANP-BC [facility]     LOS: 1 day    09/06/2014, 7:53 AM   Attending note:  Clinically, pancreatitis significantly improved. Reviewed MRI/MRCP findings. Bile duct dilated but no evidence of choledocholithiasis or pancreatic head tumor.  It is certainly possible he could've recently passed an occult common bile duct stone.  In addition, he may have a degree of cholestasis from mild hepatic decompensation in the setting of acute illness (pancreatitis) and long-term alcohol exposure. However, he does not appear to have out and out severe acute EtOH hepatitis. I do not recommend steroid-induced at this time.  I do recommend complete cessation from alcohol going forward. We need to closely follow his LFTs and see that they revert to complete normalcy (improvement in bilirubin may lag behind aminotransferases). I do not recommend a cholecystectomy at this time. I do recommend that his pancreas be reimaged in 3 months via repeat MRI or perhaps EUS just to make sure they were not on the very front end of a small pancreatic head tumor.  We should keep him on clear liquids today. Would consider advancing to a low-fat diet tomorrow depending on his clinical progress.

## 2014-09-06 NOTE — Progress Notes (Signed)
CRITICAL VALUE ALERT  Critical value received: Lipase 2258  Date of notification: 09/06/2014  Time of notification:  3235  Critical value read back:Yes.    Nurse who received alert:  Melody   MD notified (1st page):  yes  Time of first page:  1215  MD notified (2nd page):  Time of second page:  Responding MD:  Dyanne Carrel, NP  Time MD responded:

## 2014-09-06 NOTE — Progress Notes (Signed)
Discriminant function is 24. No need for prednisolone.

## 2014-09-07 ENCOUNTER — Telehealth: Payer: Self-pay | Admitting: Gastroenterology

## 2014-09-07 DIAGNOSIS — K851 Biliary acute pancreatitis without necrosis or infection: Secondary | ICD-10-CM

## 2014-09-07 LAB — BASIC METABOLIC PANEL
ANION GAP: 6 (ref 5–15)
BUN: 19 mg/dL (ref 6–20)
CALCIUM: 8.8 mg/dL — AB (ref 8.9–10.3)
CO2: 23 mmol/L (ref 22–32)
Chloride: 110 mmol/L (ref 101–111)
Creatinine, Ser: 1.09 mg/dL (ref 0.61–1.24)
Glucose, Bld: 136 mg/dL — ABNORMAL HIGH (ref 65–99)
Potassium: 4.5 mmol/L (ref 3.5–5.1)
Sodium: 139 mmol/L (ref 135–145)

## 2014-09-07 LAB — CBC
HEMATOCRIT: 37.3 % — AB (ref 39.0–52.0)
Hemoglobin: 12.3 g/dL — ABNORMAL LOW (ref 13.0–17.0)
MCH: 34.2 pg — ABNORMAL HIGH (ref 26.0–34.0)
MCHC: 33 g/dL (ref 30.0–36.0)
MCV: 103.6 fL — ABNORMAL HIGH (ref 78.0–100.0)
Platelets: 233 10*3/uL (ref 150–400)
RBC: 3.6 MIL/uL — ABNORMAL LOW (ref 4.22–5.81)
RDW: 14.1 % (ref 11.5–15.5)
WBC: 10.3 10*3/uL (ref 4.0–10.5)

## 2014-09-07 LAB — HEPATIC FUNCTION PANEL
ALBUMIN: 3.2 g/dL — AB (ref 3.5–5.0)
ALT: 381 U/L — ABNORMAL HIGH (ref 17–63)
AST: 164 U/L — ABNORMAL HIGH (ref 15–41)
Alkaline Phosphatase: 218 U/L — ABNORMAL HIGH (ref 38–126)
Bilirubin, Direct: 6 mg/dL — ABNORMAL HIGH (ref 0.1–0.5)
Indirect Bilirubin: 3.5 mg/dL — ABNORMAL HIGH (ref 0.3–0.9)
TOTAL PROTEIN: 6.6 g/dL (ref 6.5–8.1)
Total Bilirubin: 9.5 mg/dL — ABNORMAL HIGH (ref 0.3–1.2)

## 2014-09-07 LAB — LIPASE, BLOOD: Lipase: 1643 U/L — ABNORMAL HIGH (ref 22–51)

## 2014-09-07 MED ORDER — ATENOLOL 25 MG PO TABS
25.0000 mg | ORAL_TABLET | Freq: Every day | ORAL | Status: DC
  Administered 2014-09-07: 25 mg via ORAL
  Filled 2014-09-07: qty 1

## 2014-09-07 NOTE — Telephone Encounter (Signed)
FAX ORDER TO LAB FOR BLOOD DRAW NEXT WED AUG 24. PT AWARE TO HAVE BLOOD DRAWN.  PT NEEDS LOW FAT DIET. MEATS SHOULD BE BAKED, BROILED, OR BOILED.   Low-Fat Diet BREADS, CEREALS, PASTA, RICE, DRIED PEAS, AND BEANS These products are high in carbohydrates and most are low in fat. Therefore, they can be increased in the diet as substitutes for fatty foods. They too, however, contain calories and should not be eaten in excess. Cereals can be eaten for snacks as well as for breakfast.   FRUITS AND VEGETABLES It is good to eat fruits and vegetables. Besides being sources of fiber, both are rich in vitamins and some minerals. They help you get the daily allowances of these nutrients. Fruits and vegetables can be used for snacks and desserts.  MEATS Limit lean meat, chicken, [place], and fish to no more than 6 ounces per day. Beef, Pork, and Lamb Use lean cuts of beef, pork, and lamb. Lean cuts include:  Extra-lean ground beef.  Arm roast.  Sirloin tip.  Center-cut ham.  Round steak.  Loin chops.  Rump roast.  Tenderloin.  Trim all fat off the outside of meats before cooking. It is not necessary to severely decrease the intake of red meat, but lean choices should be made. Lean meat is rich in protein and contains a highly absorbable form of iron. Premenopausal women, in particular, should avoid reducing lean red meat because this could increase the risk for low red blood cells (iron-deficiency anemia).  Chicken and [place] These are good sources of protein. The fat of poultry can be reduced by removing the skin and underlying fat layers before cooking. Chicken and [place] can be substituted for lean red meat in the diet. Poultry should not be fried or covered with high-fat sauces. Fish and Shellfish Fish is a good source of protein. Shellfish contain cholesterol, but they usually are low in saturated fatty acids. The preparation of fish is important. Like chicken and [place], they should not be  fried or covered with high-fat sauces. EGGS Egg whites contain no fat or cholesterol. They can be eaten often. Try 1 to 2 egg whites instead of whole eggs in recipes or use egg substitutes that do not contain yolk. MILK AND DAIRY PRODUCTS Use skim or 1% milk instead of 2% or whole milk. Decrease whole milk, natural, and processed cheeses. Use nonfat or low-fat (2%) cottage cheese or low-fat cheeses made from vegetable oils. Choose nonfat or low-fat (1 to 2%) yogurt. Experiment with evaporated skim milk in recipes that call for heavy cream. Substitute low-fat yogurt or low-fat cottage cheese for sour cream in dips and salad dressings. Have at least 2 servings of low-fat dairy products, such as 2 glasses of skim (or 1%) milk each day to help get your daily calcium intake. FATS AND OILS Reduce the total intake of fats, especially saturated fat. Butterfat, lard, and beef fats are high in saturated fat and cholesterol. These should be avoided as much as possible. Vegetable fats do not contain cholesterol, but certain vegetable fats, such as coconut oil, palm oil, and palm kernel oil are very high in saturated fats. These should be limited. These fats are often used in bakery goods, processed foods, popcorn, oils, and nondairy creamers. Vegetable shortenings and some peanut butters contain hydrogenated oils, which are also saturated fats. Read the labels on these foods and check for saturated vegetable oils. Unsaturated vegetable oils and fats do not raise blood cholesterol. However, they should be  limited because they are fats and are high in calories. Total fat should still be limited to 30% of your daily caloric intake. Desirable liquid vegetable oils are corn oil, cottonseed oil, olive oil, canola oil, safflower oil, soybean oil, and sunflower oil. Peanut oil is not as good, but small amounts are acceptable. Buy a heart-healthy tub margarine that has no partially hydrogenated oils in the ingredients. Mayonnaise  and salad dressings often are made from unsaturated fats, but they should also be limited because of their high calorie and fat content. Seeds, nuts, peanut butter, olives, and avocados are high in fat, but the fat is mainly the unsaturated type. These foods should be limited mainly to avoid excess calories and fat. OTHER EATING TIPS Snacks  Most sweets should be limited as snacks. They tend to be rich in calories and fats, and their caloric content outweighs their nutritional value. Some good choices in snacks are graham crackers, melba toast, soda crackers, bagels (no egg), English muffins, fruits, and vegetables. These snacks are preferable to snack crackers, [place] fries, TORTILLA CHIPS, and POTATO chips. Popcorn should be air-popped or cooked in small amounts of liquid vegetable oil. Desserts Eat fruit, low-fat yogurt, and fruit ices instead of pastries, cake, and cookies. Sherbet, angel food cake, gelatin dessert, frozen low-fat yogurt, or other frozen products that do not contain saturated fat (pure fruit juice bars, frozen ice pops) are also acceptable.  COOKING METHODS Choose those methods that use little or no fat. They include: Poaching.  Braising.  Steaming.  Grilling.  Baking.  Stir-frying.  Broiling.  Microwaving.  Foods can be cooked in a nonstick pan without added fat, or use a nonfat cooking spray in regular cookware. Limit fried foods and avoid frying in saturated fat. Add moisture to lean meats by using water, broth, cooking wines, and other nonfat or low-fat sauces along with the cooking methods mentioned above. Soups and stews should be chilled after cooking. The fat that forms on top after a few hours in the refrigerator should be skimmed off. When preparing meals, avoid using excess salt. Salt can contribute to raising blood pressure in some people.  EATING AWAY FROM HOME Order entres, potatoes, and vegetables without sauces or butter. When meat exceeds the size of a deck  of cards (3 to 4 ounces), the rest can be taken home for another meal. Choose vegetable or fruit salads and ask for low-calorie salad dressings to be served on the side. Use dressings sparingly. Limit high-fat toppings, such as bacon, crumbled eggs, cheese, sunflower seeds, and olives. Ask for heart-healthy tub margarine instead of butter.

## 2014-09-07 NOTE — Progress Notes (Signed)
Subjective:  Much improved clinically. No N/V. Had good BM today. Abdominal pain much better, today only 1-2 on pain scale. Wants to advance diet.   Objective: Vital signs in last 24 hours: Temp:  [97.5 F (36.4 C)-98.2 F (36.8 C)] 97.5 F (36.4 C) (08/17 0605) Pulse Rate:  [72-113] 106 (08/17 0605) Resp:  [18-20] 19 (08/17 0605) BP: (133-150)/(81-99) 146/81 mmHg (08/17 0605) SpO2:  [95 %-99 %] 95 % (08/17 0605) Weight:  [206 lb 4.8 oz (93.577 kg)] 206 lb 4.8 oz (93.577 kg) (08/17 0605) Last BM Date: 09/04/14 General:   Alert,  Well-developed, well-nourished, pleasant and cooperative in NAD Head:  Normocephalic and atraumatic. Eyes:  Sclera clear, no icterus.  Abdomen:  Soft, nontender and nondistended.  Normal bowel sounds, without guarding, and without rebound.   Extremities:  Without clubbing, deformity or edema. Neurologic:  Alert and  oriented x4;  grossly normal neurologically. Skin:  Intact without significant lesions or rashes. Psych:  Alert and cooperative. Normal mood and affect.  Intake/Output from previous day: 08/16 0701 - 08/17 0700 In: 2453.8 [P.O.:240; I.V.:2213.8] Out: 800 [Urine:800] Intake/Output this shift: Total I/O In: 2172.9 [I.V.:2172.9] Out: -   Lab Results: CBC  Recent Labs  09/05/14 0530 09/06/14 0622 09/07/14 0619  WBC 13.5* 9.7 10.3  HGB 14.7 12.4* 12.3*  HCT 41.5 37.4* 37.3*  MCV 100.2* 103.3* 103.6*  PLT 250 203 233   BMET  Recent Labs  09/05/14 0530 09/06/14 0622 09/07/14 0619  NA 135 139 139  K 4.1 4.8 4.5  CL 109 111 110  CO2 17* 23 23  GLUCOSE 155* 115* 136*  BUN 38* 27* 19  CREATININE 1.33* 1.04 1.09  CALCIUM 9.0 8.7* 8.8*   LFTs  Recent Labs  09/05/14 0530 09/05/14 0710 09/06/14 0622  BILITOT 8.5*  --  10.5*  BILIDIR  --  5.5*  --   ALKPHOS 265*  --  218*  AST 288*  --  154*  ALT 607*  --  391*  PROT 7.8  --  6.5  ALBUMIN 3.9  --  3.1*    Recent Labs  09/05/14 0530 09/06/14 0622  09/07/14 0619  LIPASE >3000* 2258* 1643*   PT/INR  Recent Labs  09/05/14 0530 09/06/14 1336  LABPROT 13.7 14.6  INR 1.03 1.12      Imaging Studies: US Abdomen Complete  09/05/2014   CLINICAL DATA:  Abdominal pain for 3 days. Elevated liver function studies.  EXAM: ULTRASOUND ABDOMEN COMPLETE  COMPARISON:  06/15/2013  FINDINGS: Gallbladder: Moderate gallbladder distention but no wall thickening, pericholecystic fluid or sonographic Murphy sign. No gallstones.  Common bile duct: Diameter: 11 mm  Liver: There is diffuse increased echogenicity of the liver and decreased through transmission consistent with fatty infiltration. No focal lesions or biliary dilatation. Stable small cyst near the gallbladder fossa.  IVC: Normal caliber  Pancreas: Heterogeneous echogenicity and poorly defined soft tissue planes around the pancreas. Findings most consistent with acute pancreatitis which would correlate with the patient's markedly elevated lipase level.  Spleen: Normal size and echogenicity without focal lesions.  Right Kidney: Length: 10.0 cm. Normal renal cortical thickness and echogenicity without hydronephrosis. A simple appearing 17 mm cyst is noted.  Left Kidney: Length: 8.2 cm. Nodular irregular contour or and mild renal cortical thinning. No hydronephrosis.  Abdominal aorta: Normal caliber  Other findings: None.  IMPRESSION: 1. Gallbladder distention but no findings for acute cholecystitis. 2. Dilated common bile duct measuring a maximum of 11  mm. This could be due to acute pancreatitis. No obvious common bile duct stone. MRCP may be helpful for further evaluation if clinically indicated. 3. Heterogeneous appearance of the pancreas consistent with acute pancreatitis. 4. Diffuse fatty infiltration of the liver. 5. Scarring changes involving the left kidney.   Electronically Signed   By: Marijo Sanes M.D.   On: 09/05/2014 11:27   Mr 3d Recon At Scanner  09/06/2014   CLINICAL DATA:  Upper abdominal  pain, nausea, elevated LFTs  EXAM: MRI ABDOMEN WITHOUT AND WITH CONTRAST (INCLUDING MRCP)  TECHNIQUE: Multiplanar multisequence MR imaging of the abdomen was performed both before and after the administration of intravenous contrast. Heavily T2-weighted images of the biliary and pancreatic ducts were obtained, and three-dimensional MRCP images were rendered by post processing.  CONTRAST:  57mL MULTIHANCE GADOBENATE DIMEGLUMINE 529 MG/ML IV SOLN  COMPARISON:  Abdominal ultrasound dated 08/26/2014  FINDINGS: Motion degraded images.  Lower chest:  Lung bases are essentially clear.  Hepatobiliary: Scattered subcentimeter hepatic cysts. No suspicious/enhancing hepatic lesions. No hepatic steatosis.  Gallbladder is unremarkable. No intrahepatic ductal dilatation. Common duct measures 14 mm, mildly prominent. No choledocholithiasis is seen.  Pancreas: Mild prominence of the main pancreatic duct. No evidence of pancreatic head mass. No distal pancreatic atrophy. Mild peripancreatic stranding/ fluid, along the distal pancreatic body/tail (series 3/ images 31 and 33), likely sequela of pancreatitis. No drainable fluid collection/pseudocyst.  Spleen: Within normal limits.  Adrenals/Urinary Tract: Adrenal glands are within normal limits.  1.7 cm cyst in the anterior right upper kidney (series 26/ image 30).  Bilateral renal cortical scarring, left greater than right. No hydronephrosis.  Stomach/Bowel: Stomach is within normal limits.  Visualized bowel grossly unremarkable.  Vascular/Lymphatic: No evidence of abdominal aortic aneurysm.  No suspicious abdominal lymphadenopathy.  Other: No abdominal ascites.  Musculoskeletal: No focal osseous lesions.  IMPRESSION: Motion degraded images.  No intrahepatic ductal dilatation. Common duct measures 14 mm, mildly prominent. No choledocholithiasis is seen.  Mild peripancreatic stranding/fluid, likely sequela of pancreatitis. No drainable fluid collection/pseudocyst.  No evidence of  pancreatic head mass.  No distal pancreatic atrophy.  If there is continued clinical concern, consider ERCP and/or EUS.   Electronically Signed   By: Julian Hy M.D.   On: 09/06/2014 13:51   Mr Abd W/wo Cm/mrcp  09/06/2014   CLINICAL DATA:  Upper abdominal pain, nausea, elevated LFTs  EXAM: MRI ABDOMEN WITHOUT AND WITH CONTRAST (INCLUDING MRCP)  TECHNIQUE: Multiplanar multisequence MR imaging of the abdomen was performed both before and after the administration of intravenous contrast. Heavily T2-weighted images of the biliary and pancreatic ducts were obtained, and three-dimensional MRCP images were rendered by post processing.  CONTRAST:  39mL MULTIHANCE GADOBENATE DIMEGLUMINE 529 MG/ML IV SOLN  COMPARISON:  Abdominal ultrasound dated 08/26/2014  FINDINGS: Motion degraded images.  Lower chest:  Lung bases are essentially clear.  Hepatobiliary: Scattered subcentimeter hepatic cysts. No suspicious/enhancing hepatic lesions. No hepatic steatosis.  Gallbladder is unremarkable. No intrahepatic ductal dilatation. Common duct measures 14 mm, mildly prominent. No choledocholithiasis is seen.  Pancreas: Mild prominence of the main pancreatic duct. No evidence of pancreatic head mass. No distal pancreatic atrophy. Mild peripancreatic stranding/ fluid, along the distal pancreatic body/tail (series 3/ images 31 and 33), likely sequela of pancreatitis. No drainable fluid collection/pseudocyst.  Spleen: Within normal limits.  Adrenals/Urinary Tract: Adrenal glands are within normal limits.  1.7 cm cyst in the anterior right upper kidney (series 26/ image 30).  Bilateral renal cortical scarring, left greater than  right. No hydronephrosis.  Stomach/Bowel: Stomach is within normal limits.  Visualized bowel grossly unremarkable.  Vascular/Lymphatic: No evidence of abdominal aortic aneurysm.  No suspicious abdominal lymphadenopathy.  Other: No abdominal ascites.  Musculoskeletal: No focal osseous lesions.  IMPRESSION:  Motion degraded images.  No intrahepatic ductal dilatation. Common duct measures 14 mm, mildly prominent. No choledocholithiasis is seen.  Mild peripancreatic stranding/fluid, likely sequela of pancreatitis. No drainable fluid collection/pseudocyst.  No evidence of pancreatic head mass.  No distal pancreatic atrophy.  If there is continued clinical concern, consider ERCP and/or EUS.   Electronically Signed   By: Julian Hy M.D.   On: 09/06/2014 13:51  [2 weeks]   Assessment:  79 year old male admitted with likely biliary pancreatitis, elevated LFTs, and ultrasound inconclusive for CBD stones. Bilirubin increasing yesterday but transaminases improving. Clinically and symptom-wise seems to be improving from admission. Creatinine normalized from admission. As of note, hepatitis panel negative and triglycerides elevated in the 300 range but not enough to be a contributor. MRCP with no evidence of choledocholithiasis or pancreatic head tumor. Cannot rule out passage of occult CBD stone. He could have degree of cholestasis from mild hepatic decompensation in setting of acute illness and long-term etoh exposure. Does not have severe acute etoh hepatitis therefore no need for steroids at this time.   Plan: 1. Etoh cessation, patient agrees  2. Repeat LFTs today and follow as outpatient to see if completely normalize.  3. Consider reimaging in 3 months via MRI or ?EUS. 4. Low-fat diet. 5. Will cut back on IV fluids.  Laureen Ochs. Bernarda Caffey [facility] [phone] 8/17/20169:18 AM     LOS: 2 days

## 2014-09-07 NOTE — Discharge Summary (Signed)
Physician Discharge Summary  [name] VHQ:[id number] DOB: [birth year] DOA: 09/05/2014  PCP: [name], MD  Admit date: 09/05/2014 Discharge date: 09/07/2014  Time spent: 40 minutes  Recommendations for Outpatient Follow-up:  1. Follow up with Gastroenterology 3-4 weeks for evaluation of pancreatitis and repeat LFT's and possible re-imaging in 2 months time  Discharge Diagnoses:  Principal Problem:   Pancreatitis, acute Active Problems:   Essential hypertension   GERD   Hyperlipidemia   Obstructive jaundice   Elevated transaminase level   Abdominal pain   Acute renal failure   Hyperglycemia   Pancreatitis   Metabolic acidosis   Elevated liver enzymes   Acute pancreatitis   Discharge Condition: stable  Diet recommendation: heart healthy low fat  Filed Weights   09/05/14 0910 09/06/14 0519 09/07/14 0605  Weight: 92.035 kg (202 lb 14.4 oz) 93.305 kg (205 lb 11.2 oz) 93.577 kg (206 lb 4.8 oz)    History of present illness:  William Rivera is a very pleasant 79 y.o. male with a past medical history hypertension, gout, GERD, Barrett's esophagus, H pylori gastritis EtOH use presented to the emergency department on 09/05/14 with chief complaint of abdominal pain. Initial evaluation concerning for biliary obstruction and gallstone pancreatitis.  Patient reported developing sudden sharp abdominal pain characterized as sharp and constant 2 days prior. Pain located mostly in the upper right quadrant with some radiation to the flank area. Associated symptoms included nausea but no vomiting. He rated the pain a 10 out of 10 and stated that eating made it worse. He reported nothing made it better. He denied chest pain palpitation shortness of breath headache dizziness syncope or near-syncope. He denied any dysuria hematuria frequency or urgency but reported that his urine had been "quite dark" lately. He denied abdominal distention diarrhea constipation.  Workup in the emergency  department significant for creatinine of 1.3 alkaline phosphatase 265 lipase greater than 3000 AST 288 ALT 607 total bilirubin 8.5. Lactic acid within the limits of normal WBCs 13.5 and initial troponin  negative. In the emergency department he was afebrile hemodynamically stable and not hypoxic. He was provided with 1 L of normal saline 4 mg morphine 4 mg of Zofran and 40 mg of Protonix.  Hospital Course:  Pancreatitis, acute: likely related to biliary obstruction but was concern for ETOH hepatitis on admission. Hepatitis panel negative and triglycerides moderately elevated as well.  Elevated LFT's and abdominal US with Gallbladder distention but no findings for acute cholecystitis. Dilated common bile duct measuring a maximum of 11 mm. No obvious common bile duct stone. Heterogeneous appearance of the pancreas consistent with acute pancreatitis. Diffuse fatty infiltration of the liver. Scarring changes involving the left kidney.MRCP with no evidence of choledocholithiasis or pancreatic head tumor per GI evaluation. Per GI possible passage of  occult CBD. Tolerating low fat diet at discharge and follow up in 3-4 weeks with GI. Counseled to stop ETOH consumption  Active Problems: Elevated transaminase level/obstructive jaundice: related to biliary obstruction in setting of ETOH abuse. evaluated by GI. See above.    Essential hypertension: stable.    GERD:  Hx barretts esophagus.     Abdominal pain: See #1. Provided with supportive care and clinically improved. At discharge minimal pain.    Acute renal failure: hx ckd stage II. Resolved at discharge.   Hyperglycemia: no hx of same.  Mild follow up with PCP   Procedures:  MRCP 09/07/14  Consultations:  gastroenterology  Discharge Exam: Filed Vitals:  09/07/14 0605  BP: 146/81  Pulse: 106  Temp: 97.5 F (36.4 C)  Resp: 19    General: well nourished ambulating in room with steady gait Cardiovascular: RRR no MGR No LE  edema Respiratory: normal effort BS clear bilaterally no wheeze Abdomen: non-distended +BS non-tender no guarding  Discharge Instructions    Current Discharge Medication List    CONTINUE these medications which have NOT CHANGED   Details  allopurinol (ZYLOPRIM) 100 MG tablet Take 100 mg by mouth daily.      aspirin EC 81 MG tablet Take 81 mg by mouth 3 (three) times a week.    atenolol (TENORMIN) 25 MG tablet Take 25 mg by mouth daily.      omeprazole (PRILOSEC) 20 MG capsule Take 20 mg by mouth daily.    ranitidine (ZANTAC 150 MAXIMUM STRENGTH) 150 MG tablet Take 150 mg by mouth as needed for heartburn.       Allergies  Allergen Reactions  . Doxycycline     rash   Follow-up Information    Follow up with Barney Drain, MD In 4 weeks.   Specialty:  Gastroenterology   Contact information:   [place] [address] [place] [address] [phone]        The results of significant diagnostics from this hospitalization (including imaging, microbiology, ancillary and laboratory) are listed below for reference.    Significant Diagnostic Studies: US Abdomen Complete  09/05/2014   CLINICAL DATA:  Abdominal pain for 3 days. Elevated liver function studies.  EXAM: ULTRASOUND ABDOMEN COMPLETE  COMPARISON:  06/15/2013  FINDINGS: Gallbladder: Moderate gallbladder distention but no wall thickening, pericholecystic fluid or sonographic Murphy sign. No gallstones.  Common bile duct: Diameter: 11 mm  Liver: There is diffuse increased echogenicity of the liver and decreased through transmission consistent with fatty infiltration. No focal lesions or biliary dilatation. Stable small cyst near the gallbladder fossa.  IVC: Normal caliber  Pancreas: Heterogeneous echogenicity and poorly defined soft tissue planes around the pancreas. Findings most consistent with acute pancreatitis which would correlate with the patient's markedly elevated lipase level.  Spleen: Normal  size and echogenicity without focal lesions.  Right Kidney: Length: 10.0 cm. Normal renal cortical thickness and echogenicity without hydronephrosis. A simple appearing 17 mm cyst is noted.  Left Kidney: Length: 8.2 cm. Nodular irregular contour or and mild renal cortical thinning. No hydronephrosis.  Abdominal aorta: Normal caliber  Other findings: None.  IMPRESSION: 1. Gallbladder distention but no findings for acute cholecystitis. 2. Dilated common bile duct measuring a maximum of 11 mm. This could be due to acute pancreatitis. No obvious common bile duct stone. MRCP may be helpful for further evaluation if clinically indicated. 3. Heterogeneous appearance of the pancreas consistent with acute pancreatitis. 4. Diffuse fatty infiltration of the liver. 5. Scarring changes involving the left kidney.   Electronically Signed   By: Marijo Sanes M.D.   On: 09/05/2014 11:27   Mr 3d Recon At Scanner  09/06/2014   CLINICAL DATA:  Upper abdominal pain, nausea, elevated LFTs  EXAM: MRI ABDOMEN WITHOUT AND WITH CONTRAST (INCLUDING MRCP)  TECHNIQUE: Multiplanar multisequence MR imaging of the abdomen was performed both before and after the administration of intravenous contrast. Heavily T2-weighted images of the biliary and pancreatic ducts were obtained, and three-dimensional MRCP images were rendered by post processing.  CONTRAST:  22mL MULTIHANCE GADOBENATE DIMEGLUMINE 529 MG/ML IV SOLN  COMPARISON:  Abdominal ultrasound dated 08/26/2014  FINDINGS: Motion degraded images.  Lower chest:  Lung bases are essentially clear.  Hepatobiliary: Scattered subcentimeter hepatic cysts. No suspicious/enhancing hepatic lesions. No hepatic steatosis.  Gallbladder is unremarkable. No intrahepatic ductal dilatation. Common duct measures 14 mm, mildly prominent. No choledocholithiasis is seen.  Pancreas: Mild prominence of the main pancreatic duct. No evidence of pancreatic head mass. No distal pancreatic atrophy. Mild peripancreatic  stranding/ fluid, along the distal pancreatic body/tail (series 3/ images 31 and 33), likely sequela of pancreatitis. No drainable fluid collection/pseudocyst.  Spleen: Within normal limits.  Adrenals/Urinary Tract: Adrenal glands are within normal limits.  1.7 cm cyst in the anterior right upper kidney (series 26/ image 30).  Bilateral renal cortical scarring, left greater than right. No hydronephrosis.  Stomach/Bowel: Stomach is within normal limits.  Visualized bowel grossly unremarkable.  Vascular/Lymphatic: No evidence of abdominal aortic aneurysm.  No suspicious abdominal lymphadenopathy.  Other: No abdominal ascites.  Musculoskeletal: No focal osseous lesions.  IMPRESSION: Motion degraded images.  No intrahepatic ductal dilatation. Common duct measures 14 mm, mildly prominent. No choledocholithiasis is seen.  Mild peripancreatic stranding/fluid, likely sequela of pancreatitis. No drainable fluid collection/pseudocyst.  No evidence of pancreatic head mass.  No distal pancreatic atrophy.  If there is continued clinical concern, consider ERCP and/or EUS.   Electronically Signed   By: Julian Hy M.D.   On: 09/06/2014 13:51   Mr Abd W/wo Cm/mrcp  09/06/2014   CLINICAL DATA:  Upper abdominal pain, nausea, elevated LFTs  EXAM: MRI ABDOMEN WITHOUT AND WITH CONTRAST (INCLUDING MRCP)  TECHNIQUE: Multiplanar multisequence MR imaging of the abdomen was performed both before and after the administration of intravenous contrast. Heavily T2-weighted images of the biliary and pancreatic ducts were obtained, and three-dimensional MRCP images were rendered by post processing.  CONTRAST:  99mL MULTIHANCE GADOBENATE DIMEGLUMINE 529 MG/ML IV SOLN  COMPARISON:  Abdominal ultrasound dated 08/26/2014  FINDINGS: Motion degraded images.  Lower chest:  Lung bases are essentially clear.  Hepatobiliary: Scattered subcentimeter hepatic cysts. No suspicious/enhancing hepatic lesions. No hepatic steatosis.  Gallbladder is  unremarkable. No intrahepatic ductal dilatation. Common duct measures 14 mm, mildly prominent. No choledocholithiasis is seen.  Pancreas: Mild prominence of the main pancreatic duct. No evidence of pancreatic head mass. No distal pancreatic atrophy. Mild peripancreatic stranding/ fluid, along the distal pancreatic body/tail (series 3/ images 31 and 33), likely sequela of pancreatitis. No drainable fluid collection/pseudocyst.  Spleen: Within normal limits.  Adrenals/Urinary Tract: Adrenal glands are within normal limits.  1.7 cm cyst in the anterior right upper kidney (series 26/ image 30).  Bilateral renal cortical scarring, left greater than right. No hydronephrosis.  Stomach/Bowel: Stomach is within normal limits.  Visualized bowel grossly unremarkable.  Vascular/Lymphatic: No evidence of abdominal aortic aneurysm.  No suspicious abdominal lymphadenopathy.  Other: No abdominal ascites.  Musculoskeletal: No focal osseous lesions.  IMPRESSION: Motion degraded images.  No intrahepatic ductal dilatation. Common duct measures 14 mm, mildly prominent. No choledocholithiasis is seen.  Mild peripancreatic stranding/fluid, likely sequela of pancreatitis. No drainable fluid collection/pseudocyst.  No evidence of pancreatic head mass.  No distal pancreatic atrophy.  If there is continued clinical concern, consider ERCP and/or EUS.   Electronically Signed   By: Julian Hy M.D.   On: 09/06/2014 13:51    Microbiology: No results found for this or any previous visit (from the past 240 hour(s)).   Labs: Basic Metabolic Panel:  Recent Labs Lab 09/05/14 0530 09/06/14 0622 09/07/14 0619  NA 135 139 139  K 4.1 4.8 4.5  CL 109  111 110  CO2 17* 23 23  GLUCOSE 155* 115* 136*  BUN 38* 27* 19  CREATININE 1.33* 1.04 1.09  CALCIUM 9.0 8.7* 8.8*   Liver Function Tests:  Recent Labs Lab 09/05/14 0530 09/06/14 0622  AST 288* 154*  ALT 607* 391*  ALKPHOS 265* 218*  BILITOT 8.5* 10.5*  PROT 7.8 6.5   ALBUMIN 3.9 3.1*    Recent Labs Lab 09/05/14 0530 09/06/14 0622 09/07/14 0619  LIPASE >3000* 2258* 1643*   No results for input(s): AMMONIA in the last 168 hours. CBC:  Recent Labs Lab 09/05/14 0530 09/06/14 0622 09/07/14 0619  WBC 13.5* 9.7 10.3  NEUTROABS 10.1*  --   --   HGB 14.7 12.4* 12.3*  HCT 41.5 37.4* 37.3*  MCV 100.2* 103.3* 103.6*  PLT 250 203 233   Cardiac Enzymes:  Recent Labs Lab 09/05/14 0530  TROPONINI <0.03   BNP: BNP (last 3 results) No results for input(s): BNP in the last 8760 hours.  ProBNP (last 3 results) No results for input(s): PROBNP in the last 8760 hours.  CBG: No results for input(s): GLUCAP in the last 168 hours.     SignedRadene Gunning  [facility] 09/07/2014, 9:39 AM

## 2014-09-07 NOTE — Care Management Important Message (Signed)
Important Message  Patient Details  Name: DECKLAN MAU MRN: [id number] Date of Birth: [birth year]   Medicare Important Message Given:  N/A - LOS <3 / Initial given by admissions    Joylene Draft, RN 09/07/2014, 4:05 PM

## 2014-09-07 NOTE — Progress Notes (Signed)
Patient states understanding of discharge instructions.  

## 2014-09-07 NOTE — Care Management Note (Signed)
Case Management Note  Patient Details  Name: William Rivera MRN: [id number] Date of Birth: 11-24-1935  Subjective/Objective:                    Action/Plan:   Expected Discharge Date:                  Expected Discharge Plan:  Home/Self Care  In-House Referral:  NA  Discharge planning Services  CM Consult  Post Acute Care Choice:  NA Choice offered to:  NA  DME Arranged:    DME Agency:     HH Arranged:    [place] Agency:     Status of Service:  Completed, signed off  Medicare Important Message Given:    Date Medicare IM Given:    Medicare IM give by:    Date Additional Medicare IM Given:    Additional Medicare Important Message give by:     If discussed at [place] of Stay Meetings, dates discussed:    Additional Comments: Pt discharged home today. No CM needs noted. Christinia Gully Hampton, RN 09/07/2014, 4:05 PM

## 2014-09-09 ENCOUNTER — Other Ambulatory Visit: Payer: Self-pay

## 2014-09-09 DIAGNOSIS — R748 Abnormal levels of other serum enzymes: Secondary | ICD-10-CM

## 2014-09-09 NOTE — Telephone Encounter (Signed)
Orders have been faxed to [facility] and Mailing the diet to pt.

## 2014-09-12 ENCOUNTER — Telehealth: Payer: Self-pay

## 2014-09-12 MED ORDER — COLESTIPOL HCL 1 G PO TABS: 2.0000 g | ORAL_TABLET | Freq: Two times a day (BID) | ORAL | Status: DC

## 2014-09-12 NOTE — Telephone Encounter (Signed)
Could be from liver.  He is due for labs on Wednesday. Let's go ahead and have his labs done today or tomorrow.  We can try Colestid 2g BID for 2 weeks. Do not take within 2 hours of other medications. Hold for constipation. RX done.

## 2014-09-12 NOTE — Telephone Encounter (Signed)
Pt is aware and will try to do labs tomorrow.

## 2014-09-12 NOTE — Telephone Encounter (Signed)
Pt came by the office and said he is having some terrible itching today all over.  He used some bug spray Sat at home but he has used it before and not had any problems. He also had an episode about 2 weeks ago after he had some antibiotics. He wants to know if Dr. Oneida Alar feels that it is coming from his liver. He is aware Dr. Oneida Alar is off today and I will send a note to Neil Crouch, PA  For recommendation.

## 2014-09-13 DIAGNOSIS — R748 Abnormal levels of other serum enzymes: Secondary | ICD-10-CM | POA: Diagnosis not present

## 2014-09-13 NOTE — Telephone Encounter (Signed)
REVIEWED-NO ADDITIONAL RECOMMENDATIONS. 

## 2014-09-14 ENCOUNTER — Emergency Department (HOSPITAL_COMMUNITY): Payer: Medicare Other

## 2014-09-14 ENCOUNTER — Encounter (HOSPITAL_COMMUNITY)
Admission: EM | Disposition: A | Discharge status: Home / Self Care | Payer: Self-pay | Source: Home / Self Care | Attending: Internal Medicine

## 2014-09-14 ENCOUNTER — Inpatient Hospital Stay (HOSPITAL_COMMUNITY): Payer: Medicare Other | Admitting: Anesthesiology

## 2014-09-14 ENCOUNTER — Encounter (HOSPITAL_COMMUNITY): Payer: Self-pay | Admitting: Emergency Medicine

## 2014-09-14 ENCOUNTER — Inpatient Hospital Stay (HOSPITAL_COMMUNITY)
Admission: EM | Admit: 2014-09-14 | Discharge: 2014-09-15 | DRG: 435 | Disposition: A | Discharge status: Home / Self Care | Payer: Medicare Other | Attending: Internal Medicine | Admitting: Internal Medicine

## 2014-09-14 ENCOUNTER — Inpatient Hospital Stay (HOSPITAL_COMMUNITY): Payer: Medicare Other

## 2014-09-14 DIAGNOSIS — C259 Malignant neoplasm of pancreas, unspecified: Principal | ICD-10-CM | POA: Diagnosis present

## 2014-09-14 DIAGNOSIS — K831 Obstruction of bile duct: Secondary | ICD-10-CM | POA: Diagnosis not present

## 2014-09-14 DIAGNOSIS — Z87891 Personal history of nicotine dependence: Secondary | ICD-10-CM

## 2014-09-14 DIAGNOSIS — F1010 Alcohol abuse, uncomplicated: Secondary | ICD-10-CM | POA: Diagnosis not present

## 2014-09-14 DIAGNOSIS — L299 Pruritus, unspecified: Secondary | ICD-10-CM | POA: Diagnosis present

## 2014-09-14 DIAGNOSIS — I10 Essential (primary) hypertension: Secondary | ICD-10-CM | POA: Diagnosis not present

## 2014-09-14 DIAGNOSIS — Z800 Family history of malignant neoplasm of digestive organs: Secondary | ICD-10-CM

## 2014-09-14 DIAGNOSIS — R1013 Epigastric pain: Secondary | ICD-10-CM

## 2014-09-14 DIAGNOSIS — K828 Other specified diseases of gallbladder: Secondary | ICD-10-CM | POA: Diagnosis not present

## 2014-09-14 DIAGNOSIS — K219 Gastro-esophageal reflux disease without esophagitis: Secondary | ICD-10-CM | POA: Diagnosis not present

## 2014-09-14 DIAGNOSIS — R17 Unspecified jaundice: Secondary | ICD-10-CM

## 2014-09-14 DIAGNOSIS — R109 Unspecified abdominal pain: Secondary | ICD-10-CM | POA: Diagnosis present

## 2014-09-14 DIAGNOSIS — R7401 Elevation of levels of liver transaminase levels: Secondary | ICD-10-CM

## 2014-09-14 DIAGNOSIS — R740 Nonspecific elevation of levels of transaminase and lactic acid dehydrogenase [LDH]: Secondary | ICD-10-CM

## 2014-09-14 DIAGNOSIS — K838 Other specified diseases of biliary tract: Secondary | ICD-10-CM | POA: Diagnosis not present

## 2014-09-14 HISTORY — PX: BILIARY STENT PLACEMENT: SHX5538

## 2014-09-14 HISTORY — PX: SPHINCTEROTOMY: SHX5544

## 2014-09-14 HISTORY — PX: ERCP: SHX5425

## 2014-09-14 LAB — COMPREHENSIVE METABOLIC PANEL
ALK PHOS: 316 U/L — AB (ref 38–126)
ALT: 533 U/L — ABNORMAL HIGH (ref 17–63)
ANION GAP: 7 (ref 5–15)
AST: 290 U/L — ABNORMAL HIGH (ref 15–41)
Albumin: 3.1 g/dL — ABNORMAL LOW (ref 3.5–5.0)
BILIRUBIN TOTAL: 23.3 mg/dL — AB (ref 0.3–1.2)
BUN: 40 mg/dL — ABNORMAL HIGH (ref 6–20)
CALCIUM: 9.2 mg/dL (ref 8.9–10.3)
CO2: 21 mmol/L — ABNORMAL LOW (ref 22–32)
Chloride: 106 mmol/L (ref 101–111)
Creatinine, Ser: 1.02 mg/dL (ref 0.61–1.24)
GLUCOSE: 144 mg/dL — AB (ref 65–99)
Potassium: 4.2 mmol/L (ref 3.5–5.1)
Sodium: 134 mmol/L — ABNORMAL LOW (ref 135–145)
Total Protein: 6.9 g/dL (ref 6.5–8.1)

## 2014-09-14 LAB — CBC WITH DIFFERENTIAL/PLATELET
BASOS ABS: 0.1 10*3/uL (ref 0.0–0.1)
BASOS PCT: 1 % (ref 0–1)
Eosinophils Absolute: 0.2 10*3/uL (ref 0.0–0.7)
Eosinophils Relative: 2 % (ref 0–5)
HEMATOCRIT: 34.8 % — AB (ref 39.0–52.0)
Hemoglobin: 12.1 g/dL — ABNORMAL LOW (ref 13.0–17.0)
Lymphocytes Relative: 16 % (ref 12–46)
Lymphs Abs: 1.6 10*3/uL (ref 0.7–4.0)
MCH: 34.5 pg — ABNORMAL HIGH (ref 26.0–34.0)
MCHC: 34.8 g/dL (ref 30.0–36.0)
MCV: 99.1 fL (ref 78.0–100.0)
MONO ABS: 0.9 10*3/uL (ref 0.1–1.0)
Monocytes Relative: 9 % (ref 3–12)
NEUTROS ABS: 7.3 10*3/uL (ref 1.7–7.7)
NEUTROS PCT: 72 % (ref 43–77)
Platelets: 166 10*3/uL (ref 150–400)
RBC: 3.51 MIL/uL — ABNORMAL LOW (ref 4.22–5.81)
RDW: 15.2 % (ref 11.5–15.5)
WBC: 9.9 10*3/uL (ref 4.0–10.5)

## 2014-09-14 LAB — COMPLETE METABOLIC PANEL WITH GFR
ALT: 516 U/L — AB (ref 9–46)
AST: 271 U/L — AB (ref 10–35)
Albumin: 3.4 g/dL — ABNORMAL LOW (ref 3.6–5.1)
Alkaline Phosphatase: 373 U/L — ABNORMAL HIGH (ref 40–115)
BUN: 38 mg/dL — AB (ref 7–25)
CHLORIDE: 104 mmol/L (ref 98–110)
CO2: 22 mmol/L (ref 20–31)
CREATININE: 1.73 mg/dL — AB (ref 0.70–1.18)
Calcium: 9.3 mg/dL (ref 8.6–10.3)
GFR, Est African American: 43 mL/min — ABNORMAL LOW (ref >=60)
GFR, Est Non African American: 38 mL/min — ABNORMAL LOW (ref >=60)
GLUCOSE: 118 mg/dL — AB (ref 65–99)
Potassium: 4.4 mmol/L (ref 3.5–5.3)
Sodium: 137 mmol/L (ref 135–146)
TOTAL PROTEIN: 5.9 g/dL — AB (ref 6.1–8.1)
Total Bilirubin: 21.4 mg/dL — ABNORMAL HIGH (ref 0.2–1.2)

## 2014-09-14 LAB — PROTIME-INR
INR: 1.1 (ref 0.00–1.49)
Prothrombin Time: 14.4 seconds (ref 11.6–15.2)

## 2014-09-14 LAB — LIPASE, BLOOD: Lipase: 1220 U/L — ABNORMAL HIGH (ref 22–51)

## 2014-09-14 SURGERY — ERCP, WITH INTERVENTION IF INDICATED
Anesthesia: General

## 2014-09-14 MED ORDER — SODIUM CHLORIDE 0.9 % IV SOLN
1.0000 mg | Freq: Every day | INTRAVENOUS | Status: DC
  Administered 2014-09-14: 1 mg via INTRAVENOUS
  Filled 2014-09-14 (×3): qty 0.2

## 2014-09-14 MED ORDER — PANTOPRAZOLE SODIUM 40 MG PO TBEC
40.0000 mg | DELAYED_RELEASE_TABLET | Freq: Every day | ORAL | Status: DC
  Administered 2014-09-15: 40 mg via ORAL
  Filled 2014-09-14: qty 1

## 2014-09-14 MED ORDER — ZOLPIDEM TARTRATE 5 MG PO TABS
5.0000 mg | ORAL_TABLET | Freq: Once | ORAL | Status: AC
  Administered 2014-09-14: 5 mg via ORAL
  Filled 2014-09-14: qty 1

## 2014-09-14 MED ORDER — ROCURONIUM BROMIDE 100 MG/10ML IV SOLN
INTRAVENOUS | Status: DC | PRN
  Administered 2014-09-14: 25 mg via INTRAVENOUS
  Administered 2014-09-14: 5 mg via INTRAVENOUS

## 2014-09-14 MED ORDER — GLYCOPYRROLATE 0.2 MG/ML IJ SOLN
INTRAMUSCULAR | Status: AC
  Filled 2014-09-14: qty 1

## 2014-09-14 MED ORDER — ONDANSETRON HCL 4 MG/2ML IJ SOLN
INTRAMUSCULAR | Status: AC
  Filled 2014-09-14: qty 2

## 2014-09-14 MED ORDER — SODIUM CHLORIDE 0.9 % IV SOLN: INTRAVENOUS | Status: DC

## 2014-09-14 MED ORDER — SODIUM CHLORIDE 0.9 % IV SOLN
INTRAVENOUS | Status: AC
  Filled 2014-09-14: qty 100

## 2014-09-14 MED ORDER — THIAMINE HCL 100 MG/ML IJ SOLN
100.0000 mg | Freq: Every day | INTRAMUSCULAR | Status: DC
  Administered 2014-09-14: 100 mg via INTRAVENOUS
  Filled 2014-09-14 (×2): qty 2

## 2014-09-14 MED ORDER — LIDOCAINE HCL (PF) 1 % IJ SOLN
INTRAMUSCULAR | Status: AC
  Filled 2014-09-14: qty 5

## 2014-09-14 MED ORDER — ROCURONIUM BROMIDE 50 MG/5ML IV SOLN
INTRAVENOUS | Status: AC
  Filled 2014-09-14: qty 1

## 2014-09-14 MED ORDER — MORPHINE SULFATE (PF) 2 MG/ML IV SOLN: 1.0000 mg | INTRAVENOUS | Status: DC | PRN

## 2014-09-14 MED ORDER — ONDANSETRON HCL 4 MG/2ML IJ SOLN: 4.0000 mg | Freq: Four times a day (QID) | INTRAMUSCULAR | Status: DC | PRN

## 2014-09-14 MED ORDER — SENNOSIDES-DOCUSATE SODIUM 8.6-50 MG PO TABS: 1.0000 | ORAL_TABLET | Freq: Every evening | ORAL | Status: DC | PRN

## 2014-09-14 MED ORDER — GLYCOPYRROLATE 0.2 MG/ML IJ SOLN
INTRAMUSCULAR | Status: DC | PRN
  Administered 2014-09-14: 0.2 mg via INTRAVENOUS
  Administered 2014-09-14: 0.6 mg via INTRAVENOUS

## 2014-09-14 MED ORDER — SODIUM CHLORIDE 0.9 % IV BOLUS (SEPSIS)
1000.0000 mL | Freq: Once | INTRAVENOUS | Status: AC
  Administered 2014-09-14: 1000 mL via INTRAVENOUS

## 2014-09-14 MED ORDER — GLYCOPYRROLATE 0.2 MG/ML IJ SOLN
INTRAMUSCULAR | Status: AC
  Filled 2014-09-14: qty 3

## 2014-09-14 MED ORDER — PROPOFOL 10 MG/ML IV BOLUS
INTRAVENOUS | Status: AC
  Filled 2014-09-14: qty 20

## 2014-09-14 MED ORDER — PROPOFOL 10 MG/ML IV BOLUS
INTRAVENOUS | Status: DC | PRN
  Administered 2014-09-14: 130 mg via INTRAVENOUS

## 2014-09-14 MED ORDER — HYDROXYZINE HCL 25 MG PO TABS: 25.0000 mg | ORAL_TABLET | Freq: Three times a day (TID) | ORAL | Status: DC | PRN

## 2014-09-14 MED ORDER — SODIUM CHLORIDE 0.9 % IV SOLN: INTRAVENOUS | Status: AC

## 2014-09-14 MED ORDER — LACTATED RINGERS IV SOLN
INTRAVENOUS | Status: DC | PRN
  Administered 2014-09-14: 14:00:00 via INTRAVENOUS

## 2014-09-14 MED ORDER — SODIUM CHLORIDE 0.9 % IV SOLN
INTRAVENOUS | Status: DC
  Administered 2014-09-14 – 2014-09-15 (×2): via INTRAVENOUS

## 2014-09-14 MED ORDER — DIPHENHYDRAMINE HCL 25 MG PO CAPS: 25.0000 mg | ORAL_CAPSULE | Freq: Four times a day (QID) | ORAL | Status: DC | PRN

## 2014-09-14 MED ORDER — NEOSTIGMINE METHYLSULFATE 10 MG/10ML IV SOLN
INTRAVENOUS | Status: DC | PRN
  Administered 2014-09-14: 3 mg via INTRAVENOUS

## 2014-09-14 MED ORDER — NEOSTIGMINE METHYLSULFATE 10 MG/10ML IV SOLN
INTRAVENOUS | Status: AC
  Filled 2014-09-14: qty 1

## 2014-09-14 MED ORDER — LIDOCAINE HCL (CARDIAC) 10 MG/ML IV SOLN
INTRAVENOUS | Status: DC | PRN
  Administered 2014-09-14: 50 mg via INTRAVENOUS

## 2014-09-14 MED ORDER — FENTANYL CITRATE (PF) 100 MCG/2ML IJ SOLN
INTRAMUSCULAR | Status: AC
  Filled 2014-09-14: qty 4

## 2014-09-14 MED ORDER — ONDANSETRON HCL 4 MG PO TABS: 4.0000 mg | ORAL_TABLET | Freq: Four times a day (QID) | ORAL | Status: DC | PRN

## 2014-09-14 MED ORDER — HEPARIN SODIUM (PORCINE) 5000 UNIT/ML IJ SOLN: 5000.0000 [IU] | Freq: Three times a day (TID) | INTRAMUSCULAR | Status: DC

## 2014-09-14 MED ORDER — STERILE WATER FOR IRRIGATION IR SOLN
Status: DC | PRN
  Administered 2014-09-14: 1000 mL

## 2014-09-14 MED ORDER — GLUCAGON HCL RDNA (DIAGNOSTIC) 1 MG IJ SOLR
INTRAMUSCULAR | Status: AC
  Administered 2014-09-14 (×3): 0.25 mg via INTRAVENOUS
  Filled 2014-09-14: qty 2

## 2014-09-14 MED ORDER — HYDROXYZINE HCL 25 MG PO TABS
25.0000 mg | ORAL_TABLET | Freq: Once | ORAL | Status: AC
  Administered 2014-09-14: 25 mg via ORAL
  Filled 2014-09-14: qty 1

## 2014-09-14 MED ORDER — IOHEXOL 350 MG/ML SOLN
INTRAVENOUS | Status: DC | PRN
  Administered 2014-09-14: 30 mL

## 2014-09-14 MED ORDER — MIDAZOLAM HCL 2 MG/2ML IJ SOLN
INTRAMUSCULAR | Status: AC
  Filled 2014-09-14: qty 4

## 2014-09-14 MED ORDER — FENTANYL CITRATE (PF) 100 MCG/2ML IJ SOLN
INTRAMUSCULAR | Status: DC | PRN
  Administered 2014-09-14: 50 ug via INTRAVENOUS

## 2014-09-14 MED ORDER — PHENYLEPHRINE 40 MCG/ML (10ML) SYRINGE FOR IV PUSH (FOR BLOOD PRESSURE SUPPORT)
PREFILLED_SYRINGE | INTRAVENOUS | Status: AC
  Filled 2014-09-14: qty 10

## 2014-09-14 MED ORDER — KETOROLAC TROMETHAMINE 15 MG/ML IJ SOLN
15.0000 mg | Freq: Once | INTRAMUSCULAR | Status: AC
  Administered 2014-09-14: 15 mg via INTRAVENOUS
  Filled 2014-09-14: qty 1

## 2014-09-14 SURGICAL SUPPLY — 25 items
ADNANIX BILIARY STENT ×3 IMPLANT
BAG HAMPER (MISCELLANEOUS) ×3 IMPLANT
BALLN RETRIEVAL 12X15 (BALLOONS) IMPLANT
BALLN RETRIEVAL 12X15MM (BALLOONS)
BASKET TRAPEZOID 3X6 (MISCELLANEOUS) IMPLANT
BRUSH CYTOL RX .035 2.1X8X200 (MISCELLANEOUS) ×3 IMPLANT
DEVICE INFLATION ENCORE 26 (MISCELLANEOUS) IMPLANT
DEVICE LOCKING W-BIOPSY CAP (MISCELLANEOUS) ×3 IMPLANT
GUIDEWIRE HYDRA JAGWIRE .35 (WIRE) IMPLANT
GUIDEWIRE JAG HINI 025X260CM (WIRE) IMPLANT
KIT ENDO PROCEDURE PEN (KITS) ×3 IMPLANT
KIT ROOM TURNOVER APOR (KITS) ×3 IMPLANT
LUBRICANT JELLY 4.5OZ STERILE (MISCELLANEOUS) ×3 IMPLANT
PAD ARMBOARD 7.5X6 YLW CONV (MISCELLANEOUS) ×3 IMPLANT
PATHFINDER 450CM 0.18 (STENTS) IMPLANT
POSITIONER HEAD 8X9X4 ADT (SOFTGOODS) IMPLANT
SCOPE SPY DS DISPOSABLE (MISCELLANEOUS) IMPLANT
SNARE ROTATE MED OVAL 20MM (MISCELLANEOUS) IMPLANT
SNARE SHORT THROW 13M SML OVAL (MISCELLANEOUS) IMPLANT
SPHINCTEROTOME AUTOTOME .25 (MISCELLANEOUS) IMPLANT
SPHINCTEROTOME HYDRATOME 44 (MISCELLANEOUS) ×3 IMPLANT
SYSTEM CONTINUOUS INJECTION (MISCELLANEOUS) ×3 IMPLANT
WALLSTENT METAL COVERED 10X60 (STENTS) IMPLANT
WALLSTENT METAL COVERED 10X80 (STENTS) IMPLANT
WATER STERILE IRR 1000ML POUR (IV SOLUTION) ×3 IMPLANT

## 2014-09-14 NOTE — Anesthesia Procedure Notes (Signed)
Procedure Name: Intubation Date/Time: 09/14/2014 3:13 PM Performed by: Vista Deck Pre-anesthesia Checklist: Patient identified, Patient being monitored, Timeout performed, Emergency Drugs available and Suction available Patient Re-evaluated:Patient Re-evaluated prior to inductionOxygen Delivery Method: Circle System Utilized Preoxygenation: Pre-oxygenation with 100% oxygen Intubation Type: IV induction Ventilation: Mask ventilation without difficulty Laryngoscope Size: Miller and 2 Grade View: Grade I Tube type: Oral Tube size: 7.0 mm Number of attempts: 1 Airway Equipment and Method: Stylet and Bite block Placement Confirmation: ETT inserted through vocal cords under direct vision,  positive ETCO2 and breath sounds checked- equal and bilateral Secured at: 23 cm Tube secured with: Tape Dental Injury: Teeth and Oropharynx as per pre-operative assessment

## 2014-09-14 NOTE — Anesthesia Preprocedure Evaluation (Addendum)
Anesthesia Evaluation  Patient identified by MRN, date of birth, ID band Patient awake    Reviewed: Allergy & Precautions, NPO status , Patient's Chart, lab work & pertinent test results, reviewed documented beta blocker date and time   Airway Mallampati: III  TM Distance: >3 FB Neck ROM: Full    Dental  (+) Teeth Intact   Pulmonary former smoker,  Seasonal allergies breath sounds clear to auscultation        Cardiovascular Exercise Tolerance: Good hypertension, Pt. on home beta blockers Rhythm:Regular     Neuro/Psych    GI/Hepatic GERD-  Controlled,  Endo/Other    Renal/GU ARFRenal disease     Musculoskeletal   Abdominal (+)  Abdomen: tender.    Peds  Hematology   Anesthesia Other Findings Drinks 1-2 pints of bourbon weekly  Reproductive/Obstetrics                            Anesthesia Physical Anesthesia Plan  ASA: II and emergent  Anesthesia Plan: General   Post-op Pain Management:    Induction: Intravenous, Rapid sequence and Cricoid pressure planned  Airway Management Planned: Oral ETT  Additional Equipment:   Intra-op Plan:   Post-operative Plan: Extubation in OR  Informed Consent: I have reviewed the patients History and Physical, chart, labs and discussed the procedure including the risks, benefits and alternatives for the proposed anesthesia with the patient or authorized representative who has indicated his/her understanding and acceptance.   Dental advisory given  Plan Discussed with: Anesthesiologist and Surgeon  Anesthesia Plan Comments:         Anesthesia Quick Evaluation

## 2014-09-14 NOTE — Consult Note (Addendum)
Referring Provider: No ref. provider found Primary Care Physician:  [name], MD Primary Gastroenterologist:  Dr. Barney Drain, MD   Reason for Consultation:    Jaundice and pruritus the patient was recently hospitalized for acute pancreatitis.  HPI:   Patient is 79 year old Caucasian male who was admitted this facility last week for acute pancreatitis. Patient was jaundiced. Ultrasound revealed dilated CBD but no evidence of choledocholithiasis. MRCP revealed dilated CBD and evidence of distal pancreatitis. Based on his presentation was felt male. She was discharged on 09/07/2014 and his bilirubin was 9.5 mg. Patient was feeling much better with minimal epigastric discomfort. Over the weekend he developed pruritus initially thought was due to insect repellent spray that he had used. His pruritus has been progressive. He also noted worsening jaundice. His urine has been dark and stools have turned clay colored. He has not had sharp epigastric pain. He has mild soreness in epigastrium. He's had a good appetite. He denies nausea vomiting fever or chills. He states he has lost few pounds since his acute symptoms began last week. He denies melena or rectal bleeding. He is on low-dose aspirin which he takes 3-4 times a week. He denies dysphagia. Heartburns well controlled with therapy. Patient states he has been drinking bourbon 1 pint every weekend he quit drinking about 2 weeks before he got sick. He states he never got sick because her drinking every weekend. Patient had ultrasound this morning revealing CBD of 11-12 mm without choledocholithiasis and distended gallbladder without stones. No ascites or fluid collection noted around pancreas.     Past Medical History  Diagnosis Date  . Hypertension   . Gout   . Barrett's esophagus     last EGD/Bx 12/11  . Helicobacter pylori gastritis 2000    s/p treatment  . Adenomatous colon polyp 2010  . GERD (gastroesophageal reflux disease)    . GASTRITIS 09/23/2008    Qualifier: Diagnosis of  By: Nicole Kindred LPN, Doris      Past Surgical History  Procedure Laterality Date  . Esophagogastroduodenoscopy  2008    DUODENITIS & GASTRITIS 2o TO NSAIDS/ETOH  . Colonoscopy  2010    simple adenomas  . Neck surgery      DISC REPLACED  . Bladder surgery  0962,8366  . Sinus exploration  2000  . Tonsillectomy      AS A CHILD  . Esophagogastroduodenoscopy  12/2009    short segment Barrett's, small hh, chronic gastritis  . Nasal septum surgery    . Esophagogastroduodenoscopy  01/04/2011    QHU:TMLY gastritis/Barrett's, possible  . Esophagogastroduodenoscopy N/A 11/30/2012    Dr. Oneida Alar- normal esophagus, empiric dilation d/t c/o dysphagia/?cervical web, stomach= mild non erosive gastritis, inflammation on bx, duodenum= no abnormalities in the bulb and second portion of the duodenum. dilation at the gastroesphageal junction.  Azzie Almas dilation N/A 11/30/2012    Procedure: SAVORY DILATION;  Surgeon: Danie Binder, MD;  Location: [facility];  Service: Endoscopy;  Laterality: N/A;  Venia Minks dilation N/A 11/30/2012    Procedure: Venia Minks DILATION;  Surgeon: Danie Binder, MD;  Location: [facility];  Service: Endoscopy;  Laterality: N/A;  . 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 Colonoscopy N/A 07/28/2013    Dr. Oneida Alar: tubular adenoma, mild sigmoid colon diverticulosis, internal hemorhhoids   . Doppler echocardiography  2009  . Nm myoview ltd  2009    Prior to Admission medications   Medication Sig Start Date End Date Taking? Authorizing Provider  allopurinol (  ZYLOPRIM) 100 MG tablet Take 100 mg by mouth daily.      Historical Provider, MD  aspirin EC 81 MG tablet Take 81 mg by mouth 3 (three) times a week.    Historical Provider, MD  atenolol (TENORMIN) 25 MG tablet Take 25 mg by mouth daily.      Historical Provider, MD  colestipol (COLESTID) 1 G tablet Take 2 tablets (2 g total) by mouth 2 (two) times daily. Do not take within 2  hours of other medications. Hold for constipation. 09/12/14   Mahala Menghini, PA-C  omeprazole (PRILOSEC) 20 MG capsule Take 20 mg by mouth daily.    Historical Provider, MD  ranitidine (ZANTAC 150 MAXIMUM STRENGTH) 150 MG tablet Take 150 mg by mouth as needed for heartburn.    Historical Provider, MD    Current Facility-Administered Medications  Medication Dose Route Frequency Provider Last Rate Last Dose  . 0.9 %  sodium chloride infusion   Intravenous STAT Carmin Muskrat, MD      . 0.9 %  sodium chloride infusion   Intravenous Continuous Erline Hau, MD 75 mL/hr at 09/14/14 1033    . diphenhydrAMINE (BENADRYL) capsule 25 mg  25 mg Oral Q6H PRN Erline Hau, MD      . folic acid 1 mg in sodium chloride 0.9 % 50 mL IVPB  1 mg Intravenous Daily Estela Leonie Green, MD      . hydrOXYzine (ATARAX/VISTARIL) tablet 25 mg  25 mg Oral TID PRN Erline Hau, MD      . morphine 2 MG/ML injection 1 mg  1 mg Intravenous Q4H PRN Erline Hau, MD      . ondansetron [facility]) tablet 4 mg  4 mg Oral Q6H PRN Erline Hau, MD       Or  . ondansetron [facility]) injection 4 mg  4 mg Intravenous Q6H PRN Erline Hau, MD      . pantoprazole (PROTONIX) EC tablet 40 mg  40 mg Oral Daily Estela Leonie Green, MD      . senna-docusate (Senokot-S) tablet 1 tablet  1 tablet Oral QHS PRN Erline Hau, MD      . thiamine (B-1) injection 100 mg  100 mg Intravenous Daily Erline Hau, MD        Allergies as of 09/14/2014 - Review Complete 09/14/2014  Allergen Reaction Noted  . Doxycycline  09/05/2014  . Colestipol Itching 09/14/2014    Family History  Problem Relation Age of Onset  . Stomach cancer Father 97    deceased  . Colon cancer Neg Hx     Social History   Social History  . Marital Status: Married    Spouse Name: N/A  . Number of Children: 1  . Years of Education: N/A   Occupational  History  . truck driver    Social History Main Topics  . Smoking status: Former Smoker -- 3.00 packs/day    Types: Cigarettes  . Smokeless tobacco: Never Used     Comment: quit age 89 - 1 pack weekly   . Alcohol Use: 0.0 oz/week    0 Standard drinks or equivalent per week     Comment: drinks about a pint of borboun per week  . Drug Use: No  . Sexual Activity:    Partners: Female    Museum/gallery curator: None   Other Topics Concern  . Not on file  Social History Narrative    Review of Systems: See HPI, otherwise normal ROS  Physical Exam: Temp:  [97.6 F (36.4 C)-97.9 F (36.6 C)] 97.6 F (36.4 C) (08/24 1038) Pulse Rate:  [60-75] 63 (08/24 1038) Resp:  [16-18] 16 (08/24 1000) BP: (111-140)/(75-98) 134/76 mmHg (08/24 1038) SpO2:  [95 %-98 %] 98 % (08/24 1038) Weight:  [195 lb (88.451 kg)-205 lb 1.6 oz (93.033 kg)] 205 lb 1.6 oz (93.033 kg) (08/24 1038)   Patient is alert and in no acute distress. Conjunctiva was pink and sclerae deeply icteric. Oropharyngeal mucosa is normal. Dentition in satisfactory condition. No neck masses or thyromegaly noted. Cardiac exam with regular rhythm normal S1 and S2. No murmur or gallop noted. Lungs are clear to auscultation. Abdomen is symmetrical. Bowel sounds are normal. On palpation abdomen is soft with mild midepigastric tenderness. Liver edge is below RCM. It is not large or tender. Spleen is not palpable. Skin is yellow but no peripheral edema or clubbing noted.  Lab Results:  Recent Labs  09/14/14 0717  WBC 9.9  HGB 12.1*  HCT 34.8*  PLT 166   BMET  Recent Labs  09/13/14 0924 09/14/14 0717  NA 137 134*  K 4.4 4.2  CL 104 106  CO2 22 21*  GLUCOSE 118* 144*  BUN 38* 40*  CREATININE 1.73* 1.02  CALCIUM 9.3 9.2   LFT  Recent Labs  09/14/14 0717  PROT 6.9  ALBUMIN 3.1*  AST 290*  ALT 533*  ALKPHOS 316*  BILITOT 23.3*   PT/INR  Recent Labs  09/14/14 0717  LABPROT 14.4  INR 1.10     Studies/Results: US Abdomen Limited  09/14/2014   CLINICAL DATA:  Hyperbilirubinemia  EXAM: US ABDOMEN LIMITED - RIGHT UPPER QUADRANT  COMPARISON:  09/05/2014  FINDINGS: Gallbladder:  The gallbladder remains distended. No wall thickening. No Murphy sign.  Common bile duct:  Diameter: 11 mm.  This is stable.  Liver:  Diffusely increased in echogenicity without focal mass.  IMPRESSION: Stable exam.  Common bile duct remains dilated.  Gallbladder remains distended.  Echogenicity throughout the liver remains hyperechoic. This is most consistent with diffuse hepatic steatosis.   Electronically Signed   By: Marybelle Killings M.D.   On: 09/14/2014 10:14   imaging studies reviewed with radiologist(2 ultrasounds and MRCP)  Assessment; Patient is 65 old Caucasian male who presents with progressive jaundice and pruritus. He was hospitalized last week with acute pancreatitis felt to be biliary given elevated transaminases and bilirubin imaging studies were negative for choledocholithiasis or cholelithiasis CBD was dilated. His bilirubin now has jumped to 23.3. INR is within normal limits. Patient appears to have obstructive jaundice. I do not believe he has intrahepatic cholestasis or jaundice secondary to alcoholic hepatitis. He could have papillary stenosis or microlithiasis. Patient is not acutely ill and I doubt that be dealing with cholangitis. He would benefit from therapeutic ERCP.  Recent history of pancreatitis. Lipase still elevated but trending downwards. He has minimal epigastric tenderness but no stigmata of worsening pancreatitis etiology of which is felt to be biliary.  Recommendations; Hold heparin for now. ERCP with therapeutic intervention to include sphincterotomy and or stenting. Procedure and risks reviewed with patient and his wife and they're agreeable.   LOS: 0 days   REHMAN,NAJEEB U  09/14/2014, 11:59 AM

## 2014-09-14 NOTE — ED Notes (Signed)
Pt transported via wheelchair to rm [id number] by nurse tech.

## 2014-09-14 NOTE — ED Notes (Signed)
Hospitalist at bedside 

## 2014-09-14 NOTE — ED Notes (Signed)
MD at bedside. 

## 2014-09-14 NOTE — Op Note (Signed)
ERCP PROCEDURE REPORT  PATIENT:  William Rivera  MR#:  [id number] Birthdate:  February 26, 1935, 79 y.o., male Endoscopist:  Dr. Rogene Houston, MD Referred By:  Dr. Lannie Fields, MD  Procedure Date: 09/14/2014  Procedure:   ERCP with biliary sphincterotomy and stenting.    Indications:  Patient is 79 year old Caucasian male who was hospitalized last week with acute pancreatitis thought to be biliary pancreatitis. MRCP was negative for pancreatic mass. Patient now presents with intractable pruritus and bilirubin of over 20 mg. Repeat ultrasound reveals dilated CBD and distended gallbladder but no evidence of cholelithiasis/choledocholithiasis. Patient is undergoing diagnostic/therapeutic ERCP.            Informed Consent:  The risks, benefits, limitations, alternatives, and mponderable have been reviewed with the patient. I specifically discussed a 1 in 10 chance of pancreatitis, reaction to medications, bleeding, perforation and the possibility of a failed ERCP. Potential for sphincterotomy and stent placement also reviewed. Questions have been answered. All parties agreeable.  Please see history & physical in medical record for more information.  Medications:  Gen. endotracheal anesthesia Please see anesthesia record for complete details  Description of procedure:  Procedure performed in the OR. The patient was placed under anesthesia, intubated, and turned into semipermanent position. Therapeutic Pentax video duodenoscope passed through the oropharynx without any difficulty into the esophagus, stomach, and across the pylorus and pull, and descending duodenum.  Cannulation performed with ionic 44 autotome and 035 hydrajag wire  Findings:  Normal ampulla of Vater. Short stricture noted involving pancreatic duct at body with mild dilation upstream. High-grade stricture noted involving distal CBD with marked dilation upstream. Both of these strictures were in close proximity consistent with  double duct sign suspicious for pancreatic carcinoma.   Therapeutic/Diagnostic Maneuvers Performed:   Biliary sphincterotomy performed. Biliary stricture brushed for cytology. 10 French 7 cm long plastic biliary stent placed from biliary decompression resulting in free flow of black bile.  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 Markedly dilated biliary system proximal to CBD stricture. Bili sphincterotomy performed. CBD stricture was brushed for cytology. 10 French 7 cm plastic stent placed for biliary decompression with excellent immediate result.  Recommendations:  Clear liquids today. CA-19-9. LFTs serum amylase and lipase in a.m. Pancreatic endoscopic ultrasound next week.  Sharnice Bosler U  09/14/2014  5:02 PM  CC: Dr. Hilma Favors, Betsy Coder, MD & Dr. Rayne Du ref. provider found CC: Dr. Barney Drain, MD

## 2014-09-14 NOTE — ED Notes (Signed)
CRITICAL VALUE ALERT  Critical value received:  Total Bilirubin  Date of notification:  09/14/14  Time of notification:  0754  Critical value read back:Yes.    Nurse who received alert:  Maxwell Caul, RN  MD notified (1st page):  Dr Vanita Panda  Time of first page:  [phone]

## 2014-09-14 NOTE — Anesthesia Postprocedure Evaluation (Signed)
  Anesthesia Post-op Note  Patient: William Rivera  Procedure(s) Performed: Procedure(s) with comments: ENDOSCOPIC RETROGRADE CHOLANGIOPANCREATOGRAPHY (ERCP) (N/A) - With Biliary Brushing BILIARY STENT PLACEMENT (N/A) SPHINCTEROTOMY (N/A)  Patient Location: PACU  Anesthesia Type:General  Level of Consciousness: awake and alert   Airway and Oxygen Therapy: Patient Spontanous Breathing  Post-op Pain: mild  Post-op Assessment: stable              Post-op Vital Signs: Reviewed and stable  Last Vitals:  Filed Vitals:   09/14/14 1715  BP: 129/70  Pulse: 65  Temp:   Resp: 20    Complications: No apparent anesthesia complications

## 2014-09-14 NOTE — ED Provider Notes (Signed)
CSN: [id number]     Arrival date & time 09/14/14  0626 History   First MD Initiated Contact with Patient 09/14/14 0703     Chief Complaint  Patient presents with  . Pruritis    HPI Patient presents with concern of pruritus. Symptoms began gradually about 4 days ago.  Since onset symptoms of persistent, in spite of taking the medication, including Benadryl,colestipol. No relief with these medication, no with other intervention. No new other symptoms, including no confusion, disorientation, pain. He does have ongoing mild epigastric pain. Patient was discharged from this facility about one week ago after an episode of acute pancreatitis. He states that he is generally improving.  Past Medical History  Diagnosis Date  . Hypertension   . Gout   . Barrett's esophagus     last EGD/Bx 12/11  . Helicobacter pylori gastritis 2000    s/p treatment  . Adenomatous colon polyp 2010  . GERD (gastroesophageal reflux disease)   . GASTRITIS 09/23/2008    Qualifier: Diagnosis of  By: Nicole Kindred LPN, Doris     Past Surgical History  Procedure Laterality Date  . Esophagogastroduodenoscopy  2008    DUODENITIS & GASTRITIS 2o TO NSAIDS/ETOH  . Colonoscopy  2010    simple adenomas  . Neck surgery      DISC REPLACED  . Bladder surgery  [id number]  . Sinus exploration  2000  . Tonsillectomy      AS A CHILD  . Esophagogastroduodenoscopy  12/2009    short segment Barrett's, small hh, chronic gastritis  . Nasal septum surgery    . Esophagogastroduodenoscopy  01/04/2011    VEH:MCNO gastritis/Barrett's, possible  . Esophagogastroduodenoscopy N/A 11/30/2012    Dr. Oneida Alar- normal esophagus, empiric dilation d/t c/o dysphagia/?cervical web, stomach= mild non erosive gastritis, inflammation on bx, duodenum= no abnormalities in the bulb and second portion of the duodenum. dilation at the gastroesphageal junction.  Azzie Almas dilation N/A 11/30/2012    Procedure: SAVORY DILATION;  Surgeon: Danie Binder, MD;   Location: [facility];  Service: Endoscopy;  Laterality: N/A;  Venia Minks dilation N/A 11/30/2012    Procedure: Venia Minks DILATION;  Surgeon: Danie Binder, MD;  Location: [facility];  Service: Endoscopy;  Laterality: N/A;  . 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 Colonoscopy N/A 07/28/2013    Dr. Oneida Alar: tubular adenoma, mild sigmoid colon diverticulosis, internal hemorhhoids   . Doppler echocardiography  2009  . Nm myoview ltd  2009   Family History  Problem Relation Age of Onset  . Stomach cancer Father 72    deceased  . Colon cancer Neg Hx    Social History  Substance Use Topics  . Smoking status: Former Smoker -- 3.00 packs/day    Types: Cigarettes  . Smokeless tobacco: Never Used     Comment: quit age 4 - 1 pack weekly   . Alcohol Use: 0.0 oz/week    0 Standard drinks or equivalent per week     Comment: drinks about a pint of borboun per week    Review of Systems  Constitutional:       Per HPI, otherwise negative  HENT:       Per HPI, otherwise negative  Respiratory:       Per HPI, otherwise negative  Cardiovascular:       Per HPI, otherwise negative  Gastrointestinal: Positive for abdominal pain. Negative for vomiting.  Endocrine:       Negative aside from HPI  Genitourinary:  Neg aside from HPI   Musculoskeletal:       Per HPI, otherwise negative  Skin: Positive for color change.  Neurological: Negative for syncope.      Allergies  Doxycycline  Home Medications   Prior to Admission medications   Medication Sig Start Date End Date Taking? Authorizing Provider  allopurinol (ZYLOPRIM) 100 MG tablet Take 100 mg by mouth daily.      Historical Provider, MD  aspirin EC 81 MG tablet Take 81 mg by mouth 3 (three) times a week.    Historical Provider, MD  atenolol (TENORMIN) 25 MG tablet Take 25 mg by mouth daily.      Historical Provider, MD  colestipol (COLESTID) 1 G tablet Take 2 tablets (2 g total) by mouth 2 (two) times daily. Do not take within 2  hours of other medications. Hold for constipation. 09/12/14   Mahala Menghini, PA-C  omeprazole (PRILOSEC) 20 MG capsule Take 20 mg by mouth daily.    Historical Provider, MD  ranitidine (ZANTAC 150 MAXIMUM STRENGTH) 150 MG tablet Take 150 mg by mouth as needed for heartburn.    Historical Provider, MD   BP 140/83 mmHg  Pulse 75  Temp(Src) 97.9 F (36.6 C)  Resp 18  Ht 5\' 9"  (1.753 m)  Wt 195 lb (88.451 kg)  BMI 28.78 kg/m2  SpO2 95% Physical Exam  Constitutional: He is oriented to person, place, and time. He appears well-developed. No distress.  HENT:  Head: Normocephalic and atraumatic.  Eyes: Conjunctivae and EOM are normal.  Cardiovascular: Normal rate and regular rhythm.   Pulmonary/Chest: Effort normal. No stridor. No respiratory distress.  Abdominal: He exhibits no distension. There is tenderness in the epigastric area.  Musculoskeletal: He exhibits no edema.  Neurological: He is alert and oriented to person, place, and time. He exhibits normal muscle tone.  No tremor  Skin: Skin is warm and dry.  jaundice  Psychiatric: He has a normal mood and affect.  Nursing note and vitals reviewed.  After the initial evaluation the patient received IV fluid resuscitation. Atarax.  ED Course  Procedures (including critical care time) Labs Review Labs Reviewed  COMPREHENSIVE METABOLIC PANEL - Abnormal; Notable for the following:    Sodium 134 (*)    CO2 21 (*)    Glucose, Bld 144 (*)    BUN 40 (*)    Albumin 3.1 (*)    AST 290 (*)    ALT 533 (*)    Alkaline Phosphatase 316 (*)    Total Bilirubin 23.3 (*)    All other components within normal limits  CBC WITH DIFFERENTIAL/PLATELET - Abnormal; Notable for the following:    RBC 3.51 (*)    Hemoglobin 12.1 (*)    HCT 34.8 (*)    MCH 34.5 (*)    All other components within normal limits  LIPASE, BLOOD - Abnormal; Notable for the following:    Lipase 1220 (*)    All other components within normal limits    Imaging Review I  have personally reviewed and evaluated these images and lab results as part of my medical decision-making.  On re-eval the patient is awake, alert, in NAD.  I discussed his results / presentation with Dr. Laural Golden, who recommends admit  / ERCP / [place]   MDM  Patient presents after recent discharge from this facility after an episode of pancreatitis, now with concern for diffuse pruritus, jaundice. Patient also has ongoing epigastric pain. Patient is awake and alert, interacting  appropriately, but labs are notable for hyperbilirubinemia. No evidence for peritonitis. Laboratory demonstrates recent MRCP, without choledocholithiasis, but   with common bile duct dilation. After discussion with our gastroenterology colleagues, the patient was admitted for further evaluation, management.   Carmin Muskrat, MD 09/14/14 [phone]

## 2014-09-14 NOTE — H&P (Signed)
[facility]          History and Physical    PCP:   [name], MD   EDP: Carmin Muskrat, MD  Chief Complaint:  Itching all over  HPI: Patient is a 79 year old man without significant past medical history, who presents to the hospital today with the above complaints. He had a recent admission for acute pancreatitis at which time he had an MRCP that showed: No intrahepatic ductal dilatation. Common duct measures 14 mm, mildly prominent. No choledocholithiasis is seen.  Mild peripancreatic stranding/fluid, likely sequela of pancreatitis. No drainable fluid collection/pseudocyst.  No evidence of pancreatic head mass. No distal pancreatic atrophy. It was thought his pancreatitis might have been alcoholic in origin. He was scheduled to follow-up with Dr. Oneida Alar, however before this happened he developed increased itching seeking help in the emergency department today. He is very jaundiced and has noticed this getting worse in the past 5 days, his bilirubin is 23.3 and it was 9.5 upon discharge on August 17. Upon questioning he also admits to some mild epigastric pain but this has improved since discharge with lipase that is now 1220 down from 3300 upon prior presentation. EDP has discussed with Dr. Melony Overly, GI who recommends admission for ERCP. We have been asked to admit him for further evaluation and management.  Allergies:   Allergies  Allergen Reactions  . Doxycycline     rash      Past Medical History  Diagnosis Date  . Hypertension   . Gout   . Barrett's esophagus     last EGD/Bx 12/11  . Helicobacter pylori gastritis 2000    s/p treatment  . Adenomatous colon polyp 2010  . GERD (gastroesophageal reflux disease)   . GASTRITIS 09/23/2008    Qualifier: Diagnosis of  By: Nicole Kindred LPN, Doris      Past Surgical History  Procedure Laterality Date  . Esophagogastroduodenoscopy  2008    DUODENITIS & GASTRITIS 2o TO NSAIDS/ETOH  . Colonoscopy   2010    simple adenomas  . Neck surgery      DISC REPLACED  . Bladder surgery  [id number]  . Sinus exploration  2000  . Tonsillectomy      AS A CHILD  . Esophagogastroduodenoscopy  12/2009    short segment Barrett's, small hh, chronic gastritis  . Nasal septum surgery    . Esophagogastroduodenoscopy  01/04/2011    OPF:YTWK gastritis/Barrett's, possible  . Esophagogastroduodenoscopy N/A 11/30/2012    Dr. Oneida Alar- normal esophagus, empiric dilation d/t c/o dysphagia/?cervical web, stomach= mild non erosive gastritis, inflammation on bx, duodenum= no abnormalities in the bulb and second portion of the duodenum. dilation at the gastroesphageal junction.  Azzie Almas dilation N/A 11/30/2012    Procedure: SAVORY DILATION;  Surgeon: Danie Binder, MD;  Location: [facility];  Service: Endoscopy;  Laterality: N/A;  Venia Minks dilation N/A 11/30/2012    Procedure: Venia Minks DILATION;  Surgeon: Danie Binder, MD;  Location: [facility];  Service: Endoscopy;  Laterality: N/A;  . 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 Colonoscopy N/A 07/28/2013    Dr. Oneida Alar: tubular adenoma, mild sigmoid colon diverticulosis, internal hemorhhoids   . Doppler echocardiography  2009  . Nm myoview ltd  2009    Prior to Admission medications   Medication Sig Start Date End Date Taking? Authorizing Provider  allopurinol (ZYLOPRIM) 100 MG tablet Take 100 mg by mouth daily.  Historical Provider, MD  aspirin EC 81 MG tablet Take 81 mg by mouth 3 (three) times a week.    Historical Provider, MD  atenolol (TENORMIN) 25 MG tablet Take 25 mg by mouth daily.      Historical Provider, MD  colestipol (COLESTID) 1 G tablet Take 2 tablets (2 g total) by mouth 2 (two) times daily. Do not take within 2 hours of other medications. Hold for constipation. 09/12/14   Mahala Menghini, PA-C  omeprazole (PRILOSEC) 20 MG capsule Take 20 mg by mouth daily.    Historical Provider, MD  ranitidine (ZANTAC 150 MAXIMUM STRENGTH) 150 MG tablet  Take 150 mg by mouth as needed for heartburn.    Historical Provider, MD    Social History:  reports that he has quit smoking. His smoking use included Cigarettes. He smoked 3.00 packs per day. He has never used smokeless tobacco. He reports that he drinks alcohol. He reports that he does not use illicit drugs.  Family History  Problem Relation Age of Onset  . Stomach cancer Father 14    deceased  . Colon cancer Neg Hx     Review of Systems:  Constitutional: Denies fever, chills, diaphoresis, appetite change and fatigue.  HEENT: Denies photophobia, eye pain, redness, hearing loss, ear pain, congestion, sore throat, rhinorrhea, sneezing, mouth sores, trouble swallowing, neck pain, neck stiffness and tinnitus.   Respiratory: Denies SOB, DOE, cough, chest tightness,  and wheezing.   Cardiovascular: Denies chest pain, palpitations and leg swelling.  Gastrointestinal: Denies nausea, vomiting,  diarrhea, constipation, blood in stool and abdominal distention.  Genitourinary: Denies dysuria, urgency, frequency, hematuria, flank pain and difficulty urinating.  Endocrine: Denies: hot or cold intolerance, sweats, changes in hair or nails, polyuria, polydipsia. Musculoskeletal: Denies myalgias, back pain, joint swelling, arthralgias and gait problem.  Skin: Denies pallor, rash and wound.  Neurological: Denies dizziness, seizures, syncope, weakness, light-headedness, numbness and headaches.  Hematological: Denies adenopathy. Easy bruising, personal or family bleeding history  Psychiatric/Behavioral: Denies suicidal ideation, mood changes, confusion, nervousness, sleep disturbance and agitation   Physical Exam: Blood pressure 140/84, pulse 60, temperature 97.9 F (36.6 C), resp. rate 18, height _0  (1.753 m), weight 88.451 kg (195 lb), SpO2 97 %. General: Alert, awake, oriented 3, very jaundiced. HEENT: Normocephalic, atraumatic, pupils equal round reactive to light, extraocular movements  intact, scleral and conjunctival jaundice, moist mucous membranes. Neck: Supple, no JVD, no lymphadenopathy, no bruits, no goiter. Cardiovascular: Regular rate and rhythm, no murmurs, rubs or gallops. Lungs: Clear to auscultation bilaterally. Abdomen: Soft, tender to deep palpation of the right upper quadrant, nondistended, positive bowel sounds. Extremities: No clubbing, cyanosis or edema, positive pulses. Neurologic: Grossly intact and nonfocal.  Labs on Admission:  Results for orders placed or performed during the hospital encounter of 09/14/14 (from the past 48 hour(s))  Comprehensive metabolic panel     Status: Abnormal   Collection Time: 09/14/14  7:17 AM  Result Value Ref Range   Sodium 134 (L) 135 - 145 mmol/L   Potassium 4.2 3.5 - 5.1 mmol/L   Chloride 106 101 - 111 mmol/L   CO2 21 (L) 22 - 32 mmol/L   Glucose, Bld 144 (H) 65 - 99 mg/dL   BUN 40 (H) 6 - 20 mg/dL   Creatinine, Ser 1.02 0.61 - 1.24 mg/dL   Calcium 9.2 8.9 - 10.3 mg/dL   Total Protein 6.9 6.5 - 8.1 g/dL   Albumin 3.1 (L) 3.5 - 5.0 g/dL   AST 290 (  H) 15 - 41 U/L   ALT 533 (H) 17 - 63 U/L   Alkaline Phosphatase 316 (H) 38 - 126 U/L   Total Bilirubin 23.3 (HH) 0.3 - 1.2 mg/dL    Comment: CRITICAL RESULT CALLED TO, READ BACK BY AND VERIFIED WITH: OSBORNE,T AT 7:50AM ON 09/14/14 BY FESTERMAN,C    GFR calc non Af Amer >60 >60 mL/min   GFR calc Af Amer >60 >60 mL/min    Comment: (NOTE) The eGFR has been calculated using the CKD EPI equation. This calculation has not been validated in all clinical situations. eGFR's persistently <60 mL/min signify possible Chronic Kidney Disease.    Anion gap 7 5 - 15  CBC with Differential     Status: Abnormal   Collection Time: 09/14/14  7:17 AM  Result Value Ref Range   WBC 9.9 4.0 - 10.5 K/uL   RBC 3.51 (L) 4.22 - 5.81 MIL/uL   Hemoglobin 12.1 (L) 13.0 - 17.0 g/dL   HCT 34.8 (L) 39.0 - 52.0 %   MCV 99.1 78.0 - 100.0 fL   MCH 34.5 (H) 26.0 - 34.0 pg   MCHC 34.8 30.0 -  36.0 g/dL   RDW 15.2 11.5 - 15.5 %   Platelets 166 150 - 400 K/uL   Neutrophils Relative % 72 43 - 77 %   Neutro Abs 7.3 1.7 - 7.7 K/uL   Lymphocytes Relative 16 12 - 46 %   Lymphs Abs 1.6 0.7 - 4.0 K/uL   Monocytes Relative 9 3 - 12 %   Monocytes Absolute 0.9 0.1 - 1.0 K/uL   Eosinophils Relative 2 0 - 5 %   Eosinophils Absolute 0.2 0.0 - 0.7 K/uL   Basophils Relative 1 0 - 1 %   Basophils Absolute 0.1 0.0 - 0.1 K/uL  Lipase, blood     Status: Abnormal   Collection Time: 09/14/14  7:17 AM  Result Value Ref Range   Lipase 1220 (H) 22 - 51 U/L    Comment: RESULTS CONFIRMED BY MANUAL DILUTION    Radiological Exams on Admission: No results found.  Assessment/Plan Principal Problem:   Pruritus Active Problems:   Jaundice   Hyperbilirubinemia   Abdominal pain   Transaminitis   Pruritus -Secondary to hyperbilirubinemia, see below. -Treat symptomatically with Benadryl and Atarax.  Transaminitis/hyperbilirubinemia/jaundice -Etiology is not clear based on prior MRCP. -There was common bile duct dilatation on prior abdominal ultrasound and MRCP, however no evidence for common bile duct stone. -After discussion with GI, plan to admit for ERCP for further evaluation.  Abdominal pain -He has some mild epigastric and right upper quadrant pain upon deep palpation, he does not directly admit to having pain, only says he has it when questioned. His lipase remains elevated although improving so he may still have a component of mild pancreatitis. -He will remain nothing by mouth for endoscopy with ERCP. -Symptomatic management with as needed antibiotics and pain medication.  Alcohol abuse -Thiamine, folate.  DVT prophylaxis -Subcutaneous heparin  CODE STATUS -Full code   Time Spent on Admission: 85 minutes  [facility] Pager: [phone] 09/14/2014, 9:41 AM

## 2014-09-14 NOTE — Addendum Note (Signed)
Addendum  created 09/14/14 1816 by Vista Deck, CRNA   Modules edited: Anesthesia Events

## 2014-09-14 NOTE — Transfer of Care (Signed)
Immediate Anesthesia Transfer of Care Note  Patient: William Rivera  Procedure(s) Performed: Procedure(s) (LRB): ENDOSCOPIC RETROGRADE CHOLANGIOPANCREATOGRAPHY (ERCP) (N/A) BILIARY STENT PLACEMENT (N/A) SPHINCTEROTOMY (N/A)  Patient Location: PACU  Anesthesia Type: General  Level of Consciousness: awake  Airway & Oxygen Therapy: Patient Spontanous Breathing and non-rebreather face mask  Post-op Assessment: Report given to PACU RN, Post -op Vital signs reviewed and stable and Patient moving all extremities  Post vital signs: Reviewed and stable  Complications: No apparent anesthesia complications

## 2014-09-14 NOTE — ED Notes (Signed)
US at bedside

## 2014-09-14 NOTE — ED Notes (Signed)
Pt c/o itching all over since starting colestipol.

## 2014-09-14 NOTE — ED Notes (Signed)
Report called to Tedd Sias, RN on 3rd floor. Pt will be transferred to room 335.

## 2014-09-15 ENCOUNTER — Other Ambulatory Visit: Payer: Self-pay

## 2014-09-15 ENCOUNTER — Encounter (HOSPITAL_COMMUNITY): Payer: Self-pay | Admitting: Internal Medicine

## 2014-09-15 ENCOUNTER — Telehealth: Payer: Self-pay | Admitting: Gastroenterology

## 2014-09-15 DIAGNOSIS — K838 Other specified diseases of biliary tract: Secondary | ICD-10-CM

## 2014-09-15 DIAGNOSIS — R17 Unspecified jaundice: Secondary | ICD-10-CM

## 2014-09-15 DIAGNOSIS — L299 Pruritus, unspecified: Secondary | ICD-10-CM | POA: Diagnosis not present

## 2014-09-15 DIAGNOSIS — K831 Obstruction of bile duct: Secondary | ICD-10-CM

## 2014-09-15 DIAGNOSIS — C259 Malignant neoplasm of pancreas, unspecified: Secondary | ICD-10-CM | POA: Diagnosis not present

## 2014-09-15 DIAGNOSIS — R740 Nonspecific elevation of levels of transaminase and lactic acid dehydrogenase [LDH]: Secondary | ICD-10-CM | POA: Diagnosis not present

## 2014-09-15 DIAGNOSIS — R1013 Epigastric pain: Secondary | ICD-10-CM | POA: Diagnosis not present

## 2014-09-15 LAB — COMPREHENSIVE METABOLIC PANEL
ALBUMIN: 2.9 g/dL — AB (ref 3.5–5.0)
ALK PHOS: 342 U/L — AB (ref 38–126)
ALT: 462 U/L — ABNORMAL HIGH (ref 17–63)
ANION GAP: 6 (ref 5–15)
AST: 267 U/L — AB (ref 15–41)
BILIRUBIN TOTAL: 20.2 mg/dL — AB (ref 0.3–1.2)
BUN: 30 mg/dL — AB (ref 6–20)
CALCIUM: 9 mg/dL (ref 8.9–10.3)
CO2: 20 mmol/L — AB (ref 22–32)
Chloride: 110 mmol/L (ref 101–111)
Creatinine, Ser: 1.03 mg/dL (ref 0.61–1.24)
GFR calc Af Amer: >60 mL/min (ref >60)
GFR calc non Af Amer: >60 mL/min (ref >60)
GLUCOSE: 125 mg/dL — AB (ref 65–99)
Potassium: 4.2 mmol/L (ref 3.5–5.1)
SODIUM: 136 mmol/L (ref 135–145)
Total Protein: 6.4 g/dL — ABNORMAL LOW (ref 6.5–8.1)

## 2014-09-15 LAB — AMYLASE: Amylase: 206 U/L — ABNORMAL HIGH (ref 28–100)

## 2014-09-15 LAB — LIPASE, BLOOD: Lipase: 549 U/L — ABNORMAL HIGH (ref 22–51)

## 2014-09-15 MED ORDER — THIAMINE HCL 100 MG PO TABS: 100.0000 mg | ORAL_TABLET | Freq: Every day | ORAL | Status: DC

## 2014-09-15 MED ORDER — THIAMINE HCL 100 MG/ML IJ SOLN: 100.0000 mg | Freq: Every day | INTRAMUSCULAR | Status: DC

## 2014-09-15 MED ORDER — HYDROXYZINE HCL 25 MG PO TABS: 25.0000 mg | ORAL_TABLET | Freq: Three times a day (TID) | ORAL | Status: DC | PRN

## 2014-09-15 NOTE — Telephone Encounter (Signed)
Please make urgent referral for EUS with Dr. Jerene Pitch at [facility]. Per Dr. Gala Romney, really need this done by next week. Patient with obstructive jaundice s/p ERCP with common bile duct stenting. Double duct sign concerning for pancreatic adenocarcinoma.   Need appointment before patient is discharged from hospital. Thanks!

## 2014-09-15 NOTE — Telephone Encounter (Signed)
I called and spoke with referral team at [facility]. They will discuss with doctor and call us with ASAP EUS appointment.

## 2014-09-15 NOTE — Telephone Encounter (Signed)
Referral info faxed to [facility]. They will call office with appt when records are reviewed.

## 2014-09-15 NOTE — Progress Notes (Signed)
CRITICAL VALUE ALERT  Critical value received: Total Bilirubin 20.2  Date of notification:  09/15/2014  Time of notification:  06:08  Critical value read back: yes  Nurse who received alert:  Stephannie Peters, RN  MD notified (1st page): Dr. Darrick Meigs  Time of first page:  06:08  MD notified (2nd page): Dr. Darrick Meigs  Time of second page: 06:51  Responding MD:  Dr. Darrick Meigs  Time MD responded:  06:52

## 2014-09-15 NOTE — Discharge Summary (Signed)
Physician Discharge Summary  William Rivera IRC:[id number] DOB: Jul 05, 1935 DOA: 09/14/2014  PCP: [name], MD  Admit date: 09/14/2014 Discharge date: 09/15/2014  Time spent: 45 minutes  Recommendations for Outpatient Follow-up:  -Will be discharged home today. -Will need to follow up at [facility] for an EUS that will be scheduled by Dr. Orvan Falconer office.   Discharge Diagnoses:  Principal Problem:   Pruritus Active Problems:   Jaundice   Hyperbilirubinemia   Abdominal pain   Transaminitis   Discharge Condition: Stable and improved  Filed Weights   09/14/14 1038 09/14/14 1303 09/14/14 1411  Weight: 93.033 kg (205 lb 1.6 oz) 99.033 kg (218 lb 5.3 oz) 98.884 kg (218 lb)    History of present illness:  Patient is a 79 year old man without significant past medical history, who presents to the hospital today with the above complaints. He had a recent admission for acute pancreatitis at which time he had an MRCP that showed: No intrahepatic ductal dilatation. Common duct measures 14 mm, mildly prominent. No choledocholithiasis is seen.  Mild peripancreatic stranding/fluid, likely sequela of pancreatitis. No drainable fluid collection/pseudocyst.  No evidence of pancreatic head mass. No distal pancreatic atrophy. It was thought his pancreatitis might have been alcoholic in origin. He was scheduled to follow-up with Dr. Oneida Alar, however before this happened he developed increased itching seeking help in the emergency department today. He is very jaundiced and has noticed this getting worse in the past 5 days, his bilirubin is 23.3 and it was 9.5 upon discharge on August 17. Upon questioning he also admits to some mild epigastric pain but this has improved since discharge with lipase that is now 1220 down from 3300 upon prior presentation. EDP has discussed with Dr. Melony Overly, GI who recommends admission for ERCP. We were asked to admit him for further evaluation and  management.  Hospital Course:   Jaundice/Hyperbilirubinemia -Suspicion for occult pancreatic adenocarcinoma based on ERCP results. -LFTs and bilirubin improving s/p sphincterotomy and placement of biliary stent. -GI to arrange for appointment at [facility] for EUS.  Pruritus -2/2 hyperbilirubinemia. -Resolved today. -Continue PRN atarax.  ETOH Abuse -Counseled on cessation. -Thiamine daily.  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 Markedly dilated biliary system proximal to CBD stricture. Bili sphincterotomy performed. CBD stricture was brushed for cytology. 10 French 7 cm plastic stent placed for biliary decompression with excellent immediate result.  Consultations:  GI  Discharge Instructions      Discharge Instructions    Diet - low sodium heart healthy    Complete by:  As directed      Increase activity slowly    Complete by:  As directed             Medication List    STOP taking these medications        colestipol 1 G tablet  Commonly known as:  COLESTID      TAKE these medications        allopurinol 100 MG tablet  Commonly known as:  ZYLOPRIM  Take 100 mg by mouth daily.     aspirin EC 81 MG tablet  Take 81 mg by mouth 3 (three) times a week.     atenolol 25 MG tablet  Commonly known as:  TENORMIN  Take 25 mg by mouth daily.     hydrOXYzine 25 MG tablet  Commonly known as:  ATARAX/VISTARIL  Take 1 tablet (25 mg total) by mouth 3 (three) times daily as  needed for itching.     omeprazole 20 MG capsule  Commonly known as:  PRILOSEC  Take 20 mg by mouth daily.     thiamine 100 MG tablet  Take 1 tablet (100 mg total) by mouth daily.     ZANTAC 150 MAXIMUM STRENGTH 150 MG tablet  Generic drug:  ranitidine  Take 150 mg by mouth as needed for heartburn.       Allergies  Allergen Reactions  . Doxycycline     rash  . Colestipol Itching   Follow-up Information    Follow up with  Manus Rudd, MD.   Specialty:  Gastroenterology   Why:  as scheduled by his office   Contact information:   [address] [place] [address] [phone]        The results of significant diagnostics from this hospitalization (including imaging, microbiology, ancillary and laboratory) are listed below for reference.    Significant Diagnostic Studies: US Abdomen Complete  09/05/2014   CLINICAL DATA:  Abdominal pain for 3 days. Elevated liver function studies.  EXAM: ULTRASOUND ABDOMEN COMPLETE  COMPARISON:  06/15/2013  FINDINGS: Gallbladder: Moderate gallbladder distention but no wall thickening, pericholecystic fluid or sonographic Murphy sign. No gallstones.  Common bile duct: Diameter: 11 mm  Liver: There is diffuse increased echogenicity of the liver and decreased through transmission consistent with fatty infiltration. No focal lesions or biliary dilatation. Stable small cyst near the gallbladder fossa.  IVC: Normal caliber  Pancreas: Heterogeneous echogenicity and poorly defined soft tissue planes around the pancreas. Findings most consistent with acute pancreatitis which would correlate with the patient's markedly elevated lipase level.  Spleen: Normal size and echogenicity without focal lesions.  Right Kidney: Length: 10.0 cm. Normal renal cortical thickness and echogenicity without hydronephrosis. A simple appearing 17 mm cyst is noted.  Left Kidney: Length: 8.2 cm. Nodular irregular contour or and mild renal cortical thinning. No hydronephrosis.  Abdominal aorta: Normal caliber  Other findings: None.  IMPRESSION: 1. Gallbladder distention but no findings for acute cholecystitis. 2. Dilated common bile duct measuring a maximum of 11 mm. This could be due to acute pancreatitis. No obvious common bile duct stone. MRCP may be helpful for further evaluation if clinically indicated. 3. Heterogeneous appearance of the pancreas consistent with acute pancreatitis. 4. Diffuse fatty infiltration  of the liver. 5. Scarring changes involving the left kidney.   Electronically Signed   By: Marijo Sanes M.D.   On: 09/05/2014 11:27   Mr 3d Recon At Scanner  09/06/2014   CLINICAL DATA:  Upper abdominal pain, nausea, elevated LFTs  EXAM: MRI ABDOMEN WITHOUT AND WITH CONTRAST (INCLUDING MRCP)  TECHNIQUE: Multiplanar multisequence MR imaging of the abdomen was performed both before and after the administration of intravenous contrast. Heavily T2-weighted images of the biliary and pancreatic ducts were obtained, and three-dimensional MRCP images were rendered by post processing.  CONTRAST:  33mL MULTIHANCE GADOBENATE DIMEGLUMINE 529 MG/ML IV SOLN  COMPARISON:  Abdominal ultrasound dated 08/26/2014  FINDINGS: Motion degraded images.  Lower chest:  Lung bases are essentially clear.  Hepatobiliary: Scattered subcentimeter hepatic cysts. No suspicious/enhancing hepatic lesions. No hepatic steatosis.  Gallbladder is unremarkable. No intrahepatic ductal dilatation. Common duct measures 14 mm, mildly prominent. No choledocholithiasis is seen.  Pancreas: Mild prominence of the main pancreatic duct. No evidence of pancreatic head mass. No distal pancreatic atrophy. Mild peripancreatic stranding/ fluid, along the distal pancreatic body/tail (series 3/ images 31 and 33), likely sequela of pancreatitis. No drainable fluid collection/pseudocyst.  Spleen: Within normal limits.  Adrenals/Urinary Tract: Adrenal glands are within normal limits.  1.7 cm cyst in the anterior right upper kidney (series 26/ image 30).  Bilateral renal cortical scarring, left greater than right. No hydronephrosis.  Stomach/Bowel: Stomach is within normal limits.  Visualized bowel grossly unremarkable.  Vascular/Lymphatic: No evidence of abdominal aortic aneurysm.  No suspicious abdominal lymphadenopathy.  Other: No abdominal ascites.  Musculoskeletal: No focal osseous lesions.  IMPRESSION: Motion degraded images.  No intrahepatic ductal dilatation.  Common duct measures 14 mm, mildly prominent. No choledocholithiasis is seen.  Mild peripancreatic stranding/fluid, likely sequela of pancreatitis. No drainable fluid collection/pseudocyst.  No evidence of pancreatic head mass.  No distal pancreatic atrophy.  If there is continued clinical concern, consider ERCP and/or EUS.   Electronically Signed   By: Julian Hy M.D.   On: 09/06/2014 13:51   US Abdomen Limited  09/14/2014   CLINICAL DATA:  Hyperbilirubinemia  EXAM: US ABDOMEN LIMITED - RIGHT UPPER QUADRANT  COMPARISON:  09/05/2014  FINDINGS: Gallbladder:  The gallbladder remains distended. No wall thickening. No Murphy sign.  Common bile duct:  Diameter: 11 mm.  This is stable.  Liver:  Diffusely increased in echogenicity without focal mass.  IMPRESSION: Stable exam.  Common bile duct remains dilated.  Gallbladder remains distended.  Echogenicity throughout the liver remains hyperechoic. This is most consistent with diffuse hepatic steatosis.   Electronically Signed   By: Marybelle Killings M.D.   On: 09/14/2014 10:14   Dg Ercp With Sphincterotomy  09/14/2014   CLINICAL DATA:  Jaundice  EXAM: ERCP  TECHNIQUE: Multiple spot images obtained with the fluoroscopic device and submitted for interpretation post-procedure.  FLUOROSCOPY TIME:  Radiation Exposure Index (as provided by the fluoroscopic device): Not given  If the device does not provide the exposure index:  Fluoroscopy Time:  2 minutes 55 seconds  Number of Acquired Images:  4 cine loops were submitted  COMPARISON:  09/06/2014  FINDINGS: Initial images show injection within the pancreatic duct in demonstrate a focal area of narrowing in its central portion with some peripheral dilatation. Subsequent images show a wire within the common bile duct and significant central ductal dilatation and intrahepatic ductal dilatation. Completion images show a plastic stent in place draining the common bile duct.  IMPRESSION: ERCP findings as described above.  These  images were submitted for radiologic interpretation only. Please see the procedural report for the amount of contrast and the fluoroscopy time utilized.   Electronically Signed   By: Inez Catalina M.D.   On: 09/14/2014 17:01   Mr Abd W/wo Cm/mrcp  09/06/2014   CLINICAL DATA:  Upper abdominal pain, nausea, elevated LFTs  EXAM: MRI ABDOMEN WITHOUT AND WITH CONTRAST (INCLUDING MRCP)  TECHNIQUE: Multiplanar multisequence MR imaging of the abdomen was performed both before and after the administration of intravenous contrast. Heavily T2-weighted images of the biliary and pancreatic ducts were obtained, and three-dimensional MRCP images were rendered by post processing.  CONTRAST:  35mL MULTIHANCE GADOBENATE DIMEGLUMINE 529 MG/ML IV SOLN  COMPARISON:  Abdominal ultrasound dated 08/26/2014  FINDINGS: Motion degraded images.  Lower chest:  Lung bases are essentially clear.  Hepatobiliary: Scattered subcentimeter hepatic cysts. No suspicious/enhancing hepatic lesions. No hepatic steatosis.  Gallbladder is unremarkable. No intrahepatic ductal dilatation. Common duct measures 14 mm, mildly prominent. No choledocholithiasis is seen.  Pancreas: Mild prominence of the main pancreatic duct. No evidence of pancreatic head mass. No distal pancreatic atrophy. Mild peripancreatic stranding/ fluid, along the distal pancreatic body/tail (  series 3/ images 31 and 33), likely sequela of pancreatitis. No drainable fluid collection/pseudocyst.  Spleen: Within normal limits.  Adrenals/Urinary Tract: Adrenal glands are within normal limits.  1.7 cm cyst in the anterior right upper kidney (series 26/ image 30).  Bilateral renal cortical scarring, left greater than right. No hydronephrosis.  Stomach/Bowel: Stomach is within normal limits.  Visualized bowel grossly unremarkable.  Vascular/Lymphatic: No evidence of abdominal aortic aneurysm.  No suspicious abdominal lymphadenopathy.  Other: No abdominal ascites.  Musculoskeletal: No focal  osseous lesions.  IMPRESSION: Motion degraded images.  No intrahepatic ductal dilatation. Common duct measures 14 mm, mildly prominent. No choledocholithiasis is seen.  Mild peripancreatic stranding/fluid, likely sequela of pancreatitis. No drainable fluid collection/pseudocyst.  No evidence of pancreatic head mass.  No distal pancreatic atrophy.  If there is continued clinical concern, consider ERCP and/or EUS.   Electronically Signed   By: Julian Hy M.D.   On: 09/06/2014 13:51    Microbiology: No results found for this or any previous visit (from the past 240 hour(s)).   Labs: Basic Metabolic Panel:  Recent Labs Lab 09/13/14 0924 09/14/14 0717 09/15/14 0504  NA 137 134* 136  K 4.4 4.2 4.2  CL 104 106 110  CO2 22 21* 20*  GLUCOSE 118* 144* 125*  BUN 38* 40* 30*  CREATININE 1.73* 1.02 1.03  CALCIUM 9.3 9.2 9.0   Liver Function Tests:  Recent Labs Lab 09/13/14 0924 09/14/14 0717 09/15/14 0504  AST 271* 290* 267*  ALT 516* 533* 462*  ALKPHOS 373* 316* 342*  BILITOT 21.4* 23.3* 20.2*  PROT 5.9* 6.9 6.4*  ALBUMIN 3.4* 3.1* 2.9*    Recent Labs Lab 09/14/14 0717 09/15/14 0504  LIPASE 1220* 549*  AMYLASE  --  206*   No results for input(s): AMMONIA in the last 168 hours. CBC:  Recent Labs Lab 09/14/14 0717  WBC 9.9  NEUTROABS 7.3  HGB 12.1*  HCT 34.8*  MCV 99.1  PLT 166   Cardiac Enzymes: No results for input(s): CKTOTAL, CKMB, CKMBINDEX, TROPONINI in the last 168 hours. BNP: BNP (last 3 results) No results for input(s): BNP in the last 8760 hours.  ProBNP (last 3 results) No results for input(s): PROBNP in the last 8760 hours.  CBG: No results for input(s): GLUCAP in the last 168 hours.     SignedLelon Frohlich  [facility] Pager: [phone] 09/15/2014, 11:06 AM

## 2014-09-15 NOTE — Telephone Encounter (Signed)
Cindy from [facility] called this afternoon to follow up on patient getting an appointment. I told her both CJ and GF were out of the office and would be back in the morning. I told her they had been working on getting patient an EUS ASAP at [facility] and were waiting for date and time. Please follow up with Cindy in the morning.

## 2014-09-15 NOTE — Addendum Note (Signed)
Addendum  created 09/15/14 1338 by Charmaine Downs, CRNA   Modules edited: Notes Section   Notes Section:  File: [id number]

## 2014-09-15 NOTE — Progress Notes (Addendum)
Subjective:  Less itching. No abdominal pain. Had low fat breakfast.   Objective: Vital signs in last 24 hours: Temp:  [97.3 F (36.3 C)-97.8 F (36.6 C)] 97.8 F (36.6 C) (08/25 0455) Pulse Rate:  [51-82] 76 (08/25 0455) Resp:  [16-21] 16 (08/25 0455) BP: (111-140)/(59-98) 118/69 mmHg (08/25 0455) SpO2:  [92 %-99 %] 96 % (08/25 0455) Weight:  [205 lb 1.6 oz (93.033 kg)-218 lb 5.3 oz (99.033 kg)] 218 lb (98.884 kg) (08/24 1411) Last BM Date: 09/14/14 General:   Alert,  Well-developed, well-nourished, pleasant and cooperative in NAD Head:  Normocephalic and atraumatic. Eyes:  Scleraal icterus.  Abdomen:  Soft, minimal epigastric tenderness and nondistended. Extremities:  Without clubbing, deformity or edema. Neurologic:  Alert and  oriented x4;  grossly normal neurologically. Skin:  Intact without significant lesions or rashes. +jaundice Psych:  Alert and cooperative. Normal mood and affect.  Intake/Output from previous day: 08/24 0701 - 08/25 0700 In: 2938.8 [P.O.:480; I.V.:1458.8; IV Piggyback:1000] Out: 1100 [Urine:1100] Intake/Output this shift:    Lab Results: CBC  Recent Labs  09/14/14 0717  WBC 9.9  HGB 12.1*  HCT 34.8*  MCV 99.1  PLT 166   BMET  Recent Labs  09/13/14 0924 09/14/14 0717 09/15/14 0504  NA 137 134* 136  K 4.4 4.2 4.2  CL 104 106 110  CO2 22 21* 20*  GLUCOSE 118* 144* 125*  BUN 38* 40* 30*  CREATININE 1.73* 1.02 1.03  CALCIUM 9.3 9.2 9.0   LFTs  Recent Labs  09/13/14 0924 09/14/14 0717 09/15/14 0504  BILITOT 21.4* 23.3* 20.2*  ALKPHOS 373* 316* 342*  AST 271* 290* 267*  ALT 516* 533* 462*  PROT 5.9* 6.9 6.4*  ALBUMIN 3.4* 3.1* 2.9*    Recent Labs  09/14/14 0717 09/15/14 0504  LIPASE 1220* 549*   PT/INR  Recent Labs  09/14/14 0717  LABPROT 14.4  INR 1.10      Imaging Studies: US Abdomen Complete  09/05/2014   CLINICAL DATA:  Abdominal pain for 3 days. Elevated liver function studies.  EXAM: ULTRASOUND  ABDOMEN COMPLETE  COMPARISON:  06/15/2013  FINDINGS: Gallbladder: Moderate gallbladder distention but no wall thickening, pericholecystic fluid or sonographic Murphy sign. No gallstones.  Common bile duct: Diameter: 11 mm  Liver: There is diffuse increased echogenicity of the liver and decreased through transmission consistent with fatty infiltration. No focal lesions or biliary dilatation. Stable small cyst near the gallbladder fossa.  IVC: Normal caliber  Pancreas: Heterogeneous echogenicity and poorly defined soft tissue planes around the pancreas. Findings most consistent with acute pancreatitis which would correlate with the patient's markedly elevated lipase level.  Spleen: Normal size and echogenicity without focal lesions.  Right Kidney: Length: 10.0 cm. Normal renal cortical thickness and echogenicity without hydronephrosis. A simple appearing 17 mm cyst is noted.  Left Kidney: Length: 8.2 cm. Nodular irregular contour or and mild renal cortical thinning. No hydronephrosis.  Abdominal aorta: Normal caliber  Other findings: None.  IMPRESSION: 1. Gallbladder distention but no findings for acute cholecystitis. 2. Dilated common bile duct measuring a maximum of 11 mm. This could be due to acute pancreatitis. No obvious common bile duct stone. MRCP may be helpful for further evaluation if clinically indicated. 3. Heterogeneous appearance of the pancreas consistent with acute pancreatitis. 4. Diffuse fatty infiltration of the liver. 5. Scarring changes involving the left kidney.   Electronically Signed   By: Marijo Sanes M.D.   On: 09/05/2014 11:27   Mr 3d Recon At Scanner  09/06/2014   CLINICAL DATA:  Upper abdominal pain, nausea, elevated LFTs  EXAM: MRI ABDOMEN WITHOUT AND WITH CONTRAST (INCLUDING MRCP)  TECHNIQUE: Multiplanar multisequence MR imaging of the abdomen was performed both before and after the administration of intravenous contrast. Heavily T2-weighted images of the biliary and pancreatic ducts  were obtained, and three-dimensional MRCP images were rendered by post processing.  CONTRAST:  101mL MULTIHANCE GADOBENATE DIMEGLUMINE 529 MG/ML IV SOLN  COMPARISON:  Abdominal ultrasound dated 08/26/2014  FINDINGS: Motion degraded images.  Lower chest:  Lung bases are essentially clear.  Hepatobiliary: Scattered subcentimeter hepatic cysts. No suspicious/enhancing hepatic lesions. No hepatic steatosis.  Gallbladder is unremarkable. No intrahepatic ductal dilatation. Common duct measures 14 mm, mildly prominent. No choledocholithiasis is seen.  Pancreas: Mild prominence of the main pancreatic duct. No evidence of pancreatic head mass. No distal pancreatic atrophy. Mild peripancreatic stranding/ fluid, along the distal pancreatic body/tail (series 3/ images 31 and 33), likely sequela of pancreatitis. No drainable fluid collection/pseudocyst.  Spleen: Within normal limits.  Adrenals/Urinary Tract: Adrenal glands are within normal limits.  1.7 cm cyst in the anterior right upper kidney (series 26/ image 30).  Bilateral renal cortical scarring, left greater than right. No hydronephrosis.  Stomach/Bowel: Stomach is within normal limits.  Visualized bowel grossly unremarkable.  Vascular/Lymphatic: No evidence of abdominal aortic aneurysm.  No suspicious abdominal lymphadenopathy.  Other: No abdominal ascites.  Musculoskeletal: No focal osseous lesions.  IMPRESSION: Motion degraded images.  No intrahepatic ductal dilatation. Common duct measures 14 mm, mildly prominent. No choledocholithiasis is seen.  Mild peripancreatic stranding/fluid, likely sequela of pancreatitis. No drainable fluid collection/pseudocyst.  No evidence of pancreatic head mass.  No distal pancreatic atrophy.  If there is continued clinical concern, consider ERCP and/or EUS.   Electronically Signed   By: Julian Hy M.D.   On: 09/06/2014 13:51   US Abdomen Limited  09/14/2014   CLINICAL DATA:  Hyperbilirubinemia  EXAM: US ABDOMEN LIMITED - RIGHT  UPPER QUADRANT  COMPARISON:  09/05/2014  FINDINGS: Gallbladder:  The gallbladder remains distended. No wall thickening. No Murphy sign.  Common bile duct:  Diameter: 11 mm.  This is stable.  Liver:  Diffusely increased in echogenicity without focal mass.  IMPRESSION: Stable exam.  Common bile duct remains dilated.  Gallbladder remains distended.  Echogenicity throughout the liver remains hyperechoic. This is most consistent with diffuse hepatic steatosis.   Electronically Signed   By: Marybelle Killings M.D.   On: 09/14/2014 10:14   Dg Ercp With Sphincterotomy  09/14/2014   CLINICAL DATA:  Jaundice  EXAM: ERCP  TECHNIQUE: Multiple spot images obtained with the fluoroscopic device and submitted for interpretation post-procedure.  FLUOROSCOPY TIME:  Radiation Exposure Index (as provided by the fluoroscopic device): Not given  If the device does not provide the exposure index:  Fluoroscopy Time:  2 minutes 55 seconds  Number of Acquired Images:  4 cine loops were submitted  COMPARISON:  09/06/2014  FINDINGS: Initial images show injection within the pancreatic duct in demonstrate a focal area of narrowing in its central portion with some peripheral dilatation. Subsequent images show a wire within the common bile duct and significant central ductal dilatation and intrahepatic ductal dilatation. Completion images show a plastic stent in place draining the common bile duct.  IMPRESSION: ERCP findings as described above.  These images were submitted for radiologic interpretation only. Please see the procedural report for the amount of contrast and the fluoroscopy time utilized.   Electronically Signed   By: Elta Guadeloupe  Lukens M.D.   On: 09/14/2014 17:01   Mr Abd W/wo Cm/mrcp  09/06/2014   CLINICAL DATA:  Upper abdominal pain, nausea, elevated LFTs  EXAM: MRI ABDOMEN WITHOUT AND WITH CONTRAST (INCLUDING MRCP)  TECHNIQUE: Multiplanar multisequence MR imaging of the abdomen was performed both before and after the administration of  intravenous contrast. Heavily T2-weighted images of the biliary and pancreatic ducts were obtained, and three-dimensional MRCP images were rendered by post processing.  CONTRAST:  51mL MULTIHANCE GADOBENATE DIMEGLUMINE 529 MG/ML IV SOLN  COMPARISON:  Abdominal ultrasound dated 08/26/2014  FINDINGS: Motion degraded images.  Lower chest:  Lung bases are essentially clear.  Hepatobiliary: Scattered subcentimeter hepatic cysts. No suspicious/enhancing hepatic lesions. No hepatic steatosis.  Gallbladder is unremarkable. No intrahepatic ductal dilatation. Common duct measures 14 mm, mildly prominent. No choledocholithiasis is seen.  Pancreas: Mild prominence of the main pancreatic duct. No evidence of pancreatic head mass. No distal pancreatic atrophy. Mild peripancreatic stranding/ fluid, along the distal pancreatic body/tail (series 3/ images 31 and 33), likely sequela of pancreatitis. No drainable fluid collection/pseudocyst.  Spleen: Within normal limits.  Adrenals/Urinary Tract: Adrenal glands are within normal limits.  1.7 cm cyst in the anterior right upper kidney (series 26/ image 30).  Bilateral renal cortical scarring, left greater than right. No hydronephrosis.  Stomach/Bowel: Stomach is within normal limits.  Visualized bowel grossly unremarkable.  Vascular/Lymphatic: No evidence of abdominal aortic aneurysm.  No suspicious abdominal lymphadenopathy.  Other: No abdominal ascites.  Musculoskeletal: No focal osseous lesions.  IMPRESSION: Motion degraded images.  No intrahepatic ductal dilatation. Common duct measures 14 mm, mildly prominent. No choledocholithiasis is seen.  Mild peripancreatic stranding/fluid, likely sequela of pancreatitis. No drainable fluid collection/pseudocyst.  No evidence of pancreatic head mass.  No distal pancreatic atrophy.  If there is continued clinical concern, consider ERCP and/or EUS.   Electronically Signed   By: Julian Hy M.D.   On: 09/06/2014 13:51  [2  weeks]   Assessment: 79 y/o male hospitalized recently with acute pancreatitis and elevated LFTs. Imaging studies were negative for choledocholithiasis or cholelithiasis, CBD was dilated. Presented with obstructive jaundice with rapid rise in his bilirubin. ERCP with brushings/CBD stent yesterday by Dr. Laural Golden with signs worrisome for pancreatic adenocarcinoma. See findings below. Numbers dropping consistent with improved biliary drainage.   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 Markedly dilated biliary system proximal to CBD stricture. Bili sphincterotomy performed. CBD stricture was brushed for cytology. 10 French 7 cm plastic stent placed for biliary decompression with excellent immediate result.  Plan: 1. F/u pending path and labs.  2. Consider EUS at [facility] per Dr. Laural Golden given patient will likely be surgical candidate.   Laureen Ochs. Bernarda Caffey [facility] [phone] 8/25/20168:57 AM  Attending note: Patient seen late this afternoon-getting ready to be discharged. He is doing very well  -  status post biliary decompression. I agree with Dr. Laural Golden, I suspect an underlying pancreatic head malignancy. Hopefully, he will have limited stage disease and can undergo a resection. We are working expeditiously on getting him an appointment at [facility].    LOS: 1 day

## 2014-09-15 NOTE — Care Management Important Message (Signed)
Important Message  Patient Details  Name: William Rivera MRN: [id number] Date of Birth: 10/20/1935   Medicare Important Message Given:  N/A - LOS <3 / Initial given by admissions    Joylene Draft, RN 09/15/2014, 11:14 AM

## 2014-09-15 NOTE — Care Management Note (Signed)
Case Management Note  Patient Details  Name: William LIEBLER MRN: [id number] Date of Birth: [birth year]  Subjective/Objective:                  Pt admitted from home with jaundice/itching. Pt lives with his wife and will return home at discharge. Pt is independent with ADl's.  Action/Plan: Pt for discharge home today. No CM needs noted.  Expected Discharge Date:  09/17/14               Expected Discharge Plan:  Home/Self Care  In-House Referral:  NA  Discharge planning Services  CM Consult  Post Acute Care Choice:  NA Choice offered to:  NA  DME Arranged:    DME Agency:     HH Arranged:    HH Agency:     Status of Service:  Completed, signed off  Medicare Important Message Given:    Date Medicare IM Given:    Medicare IM give by:    Date Additional Medicare IM Given:    Additional Medicare Important Message give by:     If discussed at [place] of Stay Meetings, dates discussed:    Additional Comments:  Joylene Draft, RN 09/15/2014, 11:13 AM

## 2014-09-15 NOTE — Anesthesia Postprocedure Evaluation (Signed)
  Anesthesia Post-op Note  Patient: William Rivera  Procedure(s) Performed: Procedure(s) with comments: ENDOSCOPIC RETROGRADE CHOLANGIOPANCREATOGRAPHY (ERCP) (N/A) - With Biliary Brushing BILIARY STENT PLACEMENT (N/A) SPHINCTEROTOMY (N/A)  Patient Location: room 335  Anesthesia Type:General  Level of Consciousness: awake, alert , oriented and patient cooperative  Airway and Oxygen Therapy: Patient Spontanous Breathing  Post-op Pain: none  Post-op Assessment: Post-op Vital signs reviewed, Patient's Cardiovascular Status Stable, Respiratory Function Stable, Patent Airway, No signs of Nausea or vomiting, Adequate PO intake and Pain level controlled              Post-op Vital Signs: Reviewed and stable  Last Vitals:  Filed Vitals:   09/15/14 0455  BP: 118/69  Pulse: 76  Temp: 36.6 C  Resp: 16    Complications: No apparent anesthesia complications

## 2014-09-15 NOTE — Telephone Encounter (Signed)
refaxed demographic sheet

## 2014-09-16 LAB — CANCER ANTIGEN 19-9: CA 19 9: 695 U/mL — AB (ref 0–35)

## 2014-09-16 NOTE — Telephone Encounter (Signed)
[facility] and [facility] in regards to pt appt.

## 2014-09-16 NOTE — Telephone Encounter (Signed)
[facility] and still waiting on appt.

## 2014-09-20 NOTE — Telephone Encounter (Signed)
Pt has appt at [facility] on 09/21/2014 @ 8:00 am.  Pt is aware of results

## 2014-09-21 DIAGNOSIS — K219 Gastro-esophageal reflux disease without esophagitis: Secondary | ICD-10-CM | POA: Diagnosis not present

## 2014-09-21 DIAGNOSIS — R17 Unspecified jaundice: Secondary | ICD-10-CM | POA: Diagnosis not present

## 2014-09-21 DIAGNOSIS — K869 Disease of pancreas, unspecified: Secondary | ICD-10-CM | POA: Diagnosis not present

## 2014-09-21 DIAGNOSIS — Z7982 Long term (current) use of aspirin: Secondary | ICD-10-CM | POA: Diagnosis not present

## 2014-09-21 DIAGNOSIS — C259 Malignant neoplasm of pancreas, unspecified: Secondary | ICD-10-CM | POA: Diagnosis not present

## 2014-09-21 DIAGNOSIS — I10 Essential (primary) hypertension: Secondary | ICD-10-CM | POA: Diagnosis not present

## 2014-09-21 NOTE — Telephone Encounter (Signed)
CALLED PT. ITCHING AT NIGHT. HAD BIOPSY DONE TODAY AT [facility]. PENDING APPT WITH SURGERY. WAS TAKING ONE EVERY 8 hours. PT INSTRUCTED TO TAKE HYDROXYZINE 25 mg 2 tonight. If itching persists pt should increase to 3 at night. CALL FRI IF ITCHING NOT CONTROLLED PT AWARE SHOULD HELP HIM SLEEP AS WELL.

## 2014-09-21 NOTE — Telephone Encounter (Signed)
I tried to call pt and could not leave a VM on either phone. I spoke to Dr. Oneida Alar this afternoon and she was headed back to the hospital, but said she would review pt's chart and make recommendation.

## 2014-09-21 NOTE — Telephone Encounter (Signed)
Pt came by the office. He just came from his appt at [facility].  He is itching so bad and wants to know if you can call in some prednisone for him. Please advise!

## 2014-09-29 ENCOUNTER — Ambulatory Visit (INDEPENDENT_AMBULATORY_CARE_PROVIDER_SITE_OTHER): Payer: Medicare Other | Admitting: Gastroenterology

## 2014-09-29 ENCOUNTER — Encounter: Payer: Self-pay | Admitting: Gastroenterology

## 2014-09-29 VITALS — BP 116/73 | HR 78 | Temp 97.9°F | Ht 69.0 in | Wt 200.4 lb

## 2014-09-29 DIAGNOSIS — I10 Essential (primary) hypertension: Secondary | ICD-10-CM

## 2014-09-29 DIAGNOSIS — L299 Pruritus, unspecified: Secondary | ICD-10-CM

## 2014-09-29 DIAGNOSIS — K858 Other acute pancreatitis without necrosis or infection: Secondary | ICD-10-CM

## 2014-09-29 DIAGNOSIS — K859 Acute pancreatitis, unspecified: Secondary | ICD-10-CM

## 2014-09-29 DIAGNOSIS — C259 Malignant neoplasm of pancreas, unspecified: Secondary | ICD-10-CM | POA: Diagnosis not present

## 2014-09-29 MED ORDER — CHLORDIAZEPOXIDE HCL 25 MG PO CAPS: ORAL_CAPSULE | ORAL | Status: DC

## 2014-09-29 MED ORDER — HYDROXYZINE HCL 25 MG PO TABS: ORAL_TABLET | ORAL | Status: DC

## 2014-09-29 NOTE — Progress Notes (Signed)
cc'ed to pcp °

## 2014-09-29 NOTE — Assessment & Plan Note (Signed)
SYMPTOMS NOT CONTROLLED ON VISTARIL 50 MG QHS.   TAKE VISTARIL 25 MG TABLETS 3 OR 4 AT BEDTIME TO HELP WITH ITCHING.  FOLLOW UP IN 4 MOS.

## 2014-09-29 NOTE — Assessment & Plan Note (Signed)
LOST 12 LBS IN SETTING OF ACUTE ILLNESS AND NOW NEWLY DIAGNOSED WITH PANCREAS CANCER. FEELS DIZZY IF HE TAKES ATENOLOL 25 MG DAILY SO HE'S HOLDING DOSES.  PT INSTRUCTED TO TAKE  HALF YOUR  ATENOLOL DOSE DAILY. TO ACHEIVE 12.5 MG DAILY.

## 2014-09-29 NOTE — Assessment & Plan Note (Signed)
SYMPTOMS CONTROLLED/RESOLVED.  CONTINUE TO MONITOR SYMPTOMS. 

## 2014-09-29 NOTE — Progress Notes (Signed)
ON RECALL LIST  °

## 2014-09-29 NOTE — Assessment & Plan Note (Signed)
JAUNDICE IMPROVED AND PT CLINICALLY IMPROVED AFTER STENT PLACEMENT.  COMPLETE APPTS AT [facility]. TAKE LIBRIUM 2 TO 4 TABLETS AT BEDTIME TO SLEEP. FOLLOW UP IN 4 MOS.

## 2014-09-29 NOTE — Progress Notes (Signed)
Subjective:    Patient ID: William Rivera, male    DOB: Dec 11, 1935, 79 y.o.   MRN: [id number]  [name], MD  HPI PT SEEN AT [facility]. FEELING TIRED AND GETS SICK IF TRIES TO DO WORK. DID PROCEDURE AND HAD STENT IN BILE DUCT. CALLED HIM AND HE HAS MASS IN HOP. OCCASIONAL NAUSEA. NO VOMITING. BMs: OK. MILD ABDOMINAL PAIN SOMETIMES. NEED SOMETHING TO SLEEP AND FOR ITCHING. HAS TOLERATED THAN LIBRIUM IN THE PAST & TOLERATED IT. CANNOT SLEEP. TOOK 2 HYDROXYZINE AT BEDTIME AND IT DIDN'T HELPS FOR SLEEP OR ITCHING.   PT DENIES FEVER, CHILLS, HEMATOCHEZIA, nausea, vomiting, melena, diarrhea, CHEST PAIN, SHORTNESS OF BREATH,  CHANGE IN BOWEL IN HABITS, constipation, problems swallowing, problems with sedation, OR heartburn or indigestion.  Past Medical History  Diagnosis Date  . Hypertension   . Gout   . Barrett's esophagus     last EGD/Bx 12/11  . Helicobacter pylori gastritis 2000    s/p treatment  . Adenomatous colon polyp 2010  . GERD (gastroesophageal reflux disease)   . GASTRITIS 09/23/2008    Qualifier: Diagnosis of  By: Nicole Kindred LPN, Doris     Past Surgical History  Procedure Laterality Date  . Esophagogastroduodenoscopy  2008    DUODENITIS & GASTRITIS 2o TO NSAIDS/ETOH  . Colonoscopy  2010    simple adenomas  . Neck surgery      DISC REPLACED  . Bladder surgery  [id number]  . Sinus exploration  2000  . Tonsillectomy      AS A CHILD  . Esophagogastroduodenoscopy  12/2009    short segment Barrett's, small hh, chronic gastritis  . Nasal septum surgery    . Esophagogastroduodenoscopy  01/04/2011    XBD:ZHGD gastritis/Barrett's, possible  . Esophagogastroduodenoscopy N/A 11/30/2012    Dr. Oneida Alar- normal esophagus, empiric dilation d/t c/o dysphagia/?cervical web, stomach= mild non erosive gastritis, inflammation on bx, duodenum= no abnormalities in the bulb and second portion of the duodenum. dilation at the gastroesphageal junction.  Azzie Almas dilation N/A 11/30/2012   Procedure: SAVORY DILATION;  Surgeon: Danie Binder, MD;  Location: [facility];  Service: Endoscopy;  Laterality: N/A;  Venia Minks dilation N/A 11/30/2012    Procedure: Venia Minks DILATION;  Surgeon: Danie Binder, MD;  Location: [facility];  Service: Endoscopy;  Laterality: N/A;  . 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 Colonoscopy N/A 07/28/2013    Dr. Oneida Alar: tubular adenoma, mild sigmoid colon diverticulosis, internal hemorhhoids   . Doppler echocardiography  2009  . Nm myoview ltd  2009  . Ercp N/A 09/14/2014    Procedure: ENDOSCOPIC RETROGRADE CHOLANGIOPANCREATOGRAPHY (ERCP);  Surgeon: Rogene Houston, MD;  Location: [facility] ORS;  Service: Endoscopy;  Laterality: N/A;  With Biliary Brushing  . Biliary stent placement N/A 09/14/2014    Procedure: BILIARY STENT PLACEMENT;  Surgeon: Rogene Houston, MD;  Location: [facility] ORS;  Service: Endoscopy;  Laterality: N/A;  . Sphincterotomy N/A 09/14/2014    Procedure: SPHINCTEROTOMY;  Surgeon: Rogene Houston, MD;  Location: [facility] OR[facility];  Service: Endoscopy;  Laterality: N/A;   Allergies  Allergen Reactions  . Doxycycline     rash  . Colestipol Itching    Current Outpatient Prescriptions  Medication Sig Dispense Refill  . allopurinol (ZYLOPRIM) 100 MG tablet Take 100 mg by mouth daily.      Marland Kitchen aspirin EC 81 MG tablet Take 81 mg by mouth 3 (three) times a week.    Marland Kitchen atenolol (TENORMIN) 25 MG tablet  Take 25 mg by mouth daily.      . hydrOXYzine (ATARAX/VISTARIL) 25 MG tablet Take 1 tablet (25 mg total) by mouth 3 (three) times daily as needed for itching.    Marland Kitchen omeprazole (PRILOSEC) 20 MG capsule Take 20 mg by mouth daily.    . ranitidine (ZANTAC 150 MAXIMUM STRENGTH) 150 MG tablet Take 150 mg by mouth as needed for heartburn.    . thiamine 100 MG tablet Take 1 tablet (100 mg total) by mouth daily.    Marland Kitchen HYDROcodone-acetaminophen (NORCO/VICODIN) 5-325 MG per tablet TK 1 T PO  Q 6 H PRF MODERATE OR SEVERE PAIN      Review of Systems PER HPI OTHERWISE ALL  SYSTEMS ARE NEGATIVE.     Objective:   Physical Exam  Constitutional: He is oriented to person, place, and time. He appears well-developed and well-nourished. No distress.  HENT:  Head: Normocephalic and atraumatic.  Mouth/Throat: Oropharynx is clear and moist. No oropharyngeal exudate.  Eyes: Pupils are equal, round, and reactive to light. Scleral icterus (SLIGHTLY) is present.  Neck: Normal range of motion. Neck supple.  Cardiovascular: Normal rate, regular rhythm and normal heart sounds.   Pulmonary/Chest: Effort normal and breath sounds normal. No respiratory distress.  Abdominal: Soft. Bowel sounds are normal. He exhibits no distension. There is tenderness. There is no rebound and no guarding.  MILD IN THE EPIGASTRIUM AND RUQ  Musculoskeletal: He exhibits no edema.  Lymphadenopathy:    He has no cervical adenopathy.  Neurological: He is alert and oriented to person, place, and time.  NO FOCAL DEFICITS   Psychiatric: He has a normal mood and affect.  Vitals reviewed.         Assessment & Plan:

## 2014-09-29 NOTE — Patient Instructions (Signed)
TAKE VISTARIL 25 MG TABLETS 3 OR 4 AT BEDTIME TO HELP WITH ITCHING.  TAKE LIBRIUM 2 TO 4 TABLETS AT BEDTIME TO SLEEP.  TAKE HALF YOUR  ATENOLOL DOSE DAILY. YOU SHOULD BE TAKING 12.5 MG DAILY. YOUR BLOOD PRESSURE IS LOWER BECAUSE YOU LOST WEIGHT.  FOLLOW UP IN 4 MOS.

## 2014-10-04 DIAGNOSIS — C250 Malignant neoplasm of head of pancreas: Secondary | ICD-10-CM | POA: Diagnosis not present

## 2014-10-06 DIAGNOSIS — I10 Essential (primary) hypertension: Secondary | ICD-10-CM | POA: Diagnosis not present

## 2014-10-06 DIAGNOSIS — Z79899 Other long term (current) drug therapy: Secondary | ICD-10-CM | POA: Diagnosis not present

## 2014-10-06 DIAGNOSIS — C250 Malignant neoplasm of head of pancreas: Secondary | ICD-10-CM | POA: Diagnosis not present

## 2014-10-06 DIAGNOSIS — Z7982 Long term (current) use of aspirin: Secondary | ICD-10-CM | POA: Diagnosis not present

## 2014-10-06 DIAGNOSIS — Z881 Allergy status to other antibiotic agents status: Secondary | ICD-10-CM | POA: Diagnosis not present

## 2014-10-06 DIAGNOSIS — R918 Other nonspecific abnormal finding of lung field: Secondary | ICD-10-CM | POA: Diagnosis not present

## 2014-10-13 DIAGNOSIS — Z452 Encounter for adjustment and management of vascular access device: Secondary | ICD-10-CM | POA: Diagnosis not present

## 2014-10-13 DIAGNOSIS — I10 Essential (primary) hypertension: Secondary | ICD-10-CM | POA: Diagnosis not present

## 2014-10-13 DIAGNOSIS — K869 Disease of pancreas, unspecified: Secondary | ICD-10-CM | POA: Diagnosis not present

## 2014-10-13 DIAGNOSIS — C250 Malignant neoplasm of head of pancreas: Secondary | ICD-10-CM | POA: Diagnosis not present

## 2014-10-14 DIAGNOSIS — K219 Gastro-esophageal reflux disease without esophagitis: Secondary | ICD-10-CM | POA: Diagnosis not present

## 2014-10-14 DIAGNOSIS — Z79899 Other long term (current) drug therapy: Secondary | ICD-10-CM | POA: Diagnosis not present

## 2014-10-14 DIAGNOSIS — C250 Malignant neoplasm of head of pancreas: Secondary | ICD-10-CM | POA: Diagnosis not present

## 2014-10-14 DIAGNOSIS — I10 Essential (primary) hypertension: Secondary | ICD-10-CM | POA: Diagnosis not present

## 2014-10-14 DIAGNOSIS — Z809 Family history of malignant neoplasm, unspecified: Secondary | ICD-10-CM | POA: Diagnosis not present

## 2014-10-18 DIAGNOSIS — H40023 Open angle with borderline findings, high risk, bilateral: Secondary | ICD-10-CM | POA: Diagnosis not present

## 2014-10-20 DIAGNOSIS — C250 Malignant neoplasm of head of pancreas: Secondary | ICD-10-CM | POA: Diagnosis not present

## 2014-10-20 DIAGNOSIS — R918 Other nonspecific abnormal finding of lung field: Secondary | ICD-10-CM | POA: Diagnosis not present

## 2014-10-20 DIAGNOSIS — Z6830 Body mass index (BMI) 30.0-30.9, adult: Secondary | ICD-10-CM | POA: Diagnosis not present

## 2014-10-20 DIAGNOSIS — Z7982 Long term (current) use of aspirin: Secondary | ICD-10-CM | POA: Diagnosis not present

## 2014-10-20 DIAGNOSIS — Z5111 Encounter for antineoplastic chemotherapy: Secondary | ICD-10-CM | POA: Diagnosis not present

## 2014-11-03 DIAGNOSIS — Z5111 Encounter for antineoplastic chemotherapy: Secondary | ICD-10-CM | POA: Diagnosis not present

## 2014-11-03 DIAGNOSIS — C250 Malignant neoplasm of head of pancreas: Secondary | ICD-10-CM | POA: Diagnosis not present

## 2014-11-17 DIAGNOSIS — Z5111 Encounter for antineoplastic chemotherapy: Secondary | ICD-10-CM | POA: Diagnosis not present

## 2014-11-17 DIAGNOSIS — C250 Malignant neoplasm of head of pancreas: Secondary | ICD-10-CM | POA: Diagnosis not present

## 2014-12-01 DIAGNOSIS — C250 Malignant neoplasm of head of pancreas: Secondary | ICD-10-CM | POA: Diagnosis not present

## 2014-12-01 DIAGNOSIS — Z881 Allergy status to other antibiotic agents status: Secondary | ICD-10-CM | POA: Diagnosis not present

## 2014-12-01 DIAGNOSIS — Z5111 Encounter for antineoplastic chemotherapy: Secondary | ICD-10-CM | POA: Diagnosis not present

## 2014-12-01 DIAGNOSIS — Z7982 Long term (current) use of aspirin: Secondary | ICD-10-CM | POA: Diagnosis not present

## 2014-12-01 DIAGNOSIS — Z79899 Other long term (current) drug therapy: Secondary | ICD-10-CM | POA: Diagnosis not present

## 2014-12-08 DIAGNOSIS — Z9221 Personal history of antineoplastic chemotherapy: Secondary | ICD-10-CM | POA: Diagnosis not present

## 2014-12-08 DIAGNOSIS — Z978 Presence of other specified devices: Secondary | ICD-10-CM | POA: Diagnosis not present

## 2014-12-08 DIAGNOSIS — Z79899 Other long term (current) drug therapy: Secondary | ICD-10-CM | POA: Diagnosis not present

## 2014-12-08 DIAGNOSIS — Z7982 Long term (current) use of aspirin: Secondary | ICD-10-CM | POA: Diagnosis not present

## 2014-12-08 DIAGNOSIS — R918 Other nonspecific abnormal finding of lung field: Secondary | ICD-10-CM | POA: Diagnosis not present

## 2014-12-08 DIAGNOSIS — C250 Malignant neoplasm of head of pancreas: Secondary | ICD-10-CM | POA: Diagnosis not present

## 2014-12-13 DIAGNOSIS — C250 Malignant neoplasm of head of pancreas: Secondary | ICD-10-CM | POA: Diagnosis not present

## 2014-12-13 DIAGNOSIS — Z881 Allergy status to other antibiotic agents status: Secondary | ICD-10-CM | POA: Diagnosis not present

## 2014-12-21 DIAGNOSIS — Z888 Allergy status to other drugs, medicaments and biological substances status: Secondary | ICD-10-CM | POA: Diagnosis not present

## 2014-12-21 DIAGNOSIS — Z9181 History of falling: Secondary | ICD-10-CM | POA: Diagnosis not present

## 2014-12-21 DIAGNOSIS — I959 Hypotension, unspecified: Secondary | ICD-10-CM | POA: Diagnosis not present

## 2014-12-21 DIAGNOSIS — N289 Disorder of kidney and ureter, unspecified: Secondary | ICD-10-CM | POA: Diagnosis not present

## 2014-12-21 DIAGNOSIS — T1490 Injury, unspecified: Secondary | ICD-10-CM | POA: Diagnosis not present

## 2014-12-21 DIAGNOSIS — Y92512 Supermarket, store or market as the place of occurrence of the external cause: Secondary | ICD-10-CM | POA: Diagnosis not present

## 2014-12-21 DIAGNOSIS — R55 Syncope and collapse: Secondary | ICD-10-CM | POA: Diagnosis not present

## 2014-12-21 DIAGNOSIS — W19XXXA Unspecified fall, initial encounter: Secondary | ICD-10-CM | POA: Diagnosis not present

## 2014-12-21 DIAGNOSIS — C259 Malignant neoplasm of pancreas, unspecified: Secondary | ICD-10-CM | POA: Diagnosis not present

## 2014-12-27 ENCOUNTER — Ambulatory Visit: Payer: Medicare Other | Admitting: Gastroenterology

## 2014-12-27 DIAGNOSIS — H02724 Madarosis of left upper eyelid and periocular area: Secondary | ICD-10-CM | POA: Diagnosis not present

## 2014-12-27 DIAGNOSIS — H02725 Madarosis of left lower eyelid and periocular area: Secondary | ICD-10-CM | POA: Diagnosis not present

## 2014-12-27 DIAGNOSIS — H02722 Madarosis of right lower eyelid and periocular area: Secondary | ICD-10-CM | POA: Diagnosis not present

## 2014-12-27 DIAGNOSIS — H04123 Dry eye syndrome of bilateral lacrimal glands: Secondary | ICD-10-CM | POA: Diagnosis not present

## 2014-12-30 ENCOUNTER — Telehealth: Payer: Self-pay

## 2014-12-30 DIAGNOSIS — C250 Malignant neoplasm of head of pancreas: Secondary | ICD-10-CM | POA: Diagnosis not present

## 2014-12-30 DIAGNOSIS — L299 Pruritus, unspecified: Secondary | ICD-10-CM | POA: Diagnosis not present

## 2014-12-30 DIAGNOSIS — Z510 Encounter for antineoplastic radiation therapy: Secondary | ICD-10-CM | POA: Diagnosis not present

## 2014-12-30 NOTE — Telephone Encounter (Signed)
T/C from [place] at the cancer center in [place]. Pt was seen there today in preparation for starting back on his radiation. He had orhostatic blood pressures. His liver labs looked very bad. Bili 4.4 and others very elevated. He has pruritis very bad again. I scheduled him an urgent OV with [name], NP on Monday, 01/02/2015 at 11:30 AM.

## 2014-12-30 NOTE — Telephone Encounter (Signed)
Noted  

## 2015-01-02 ENCOUNTER — Other Ambulatory Visit (HOSPITAL_COMMUNITY)
Admission: RE | Admit: 2015-01-02 | Discharge: 2015-01-02 | Disposition: A | Discharge status: Home / Self Care | Payer: Medicare Other | Source: Ambulatory Visit | Attending: Gastroenterology | Admitting: Gastroenterology

## 2015-01-02 ENCOUNTER — Other Ambulatory Visit: Payer: Self-pay | Admitting: Gastroenterology

## 2015-01-02 ENCOUNTER — Ambulatory Visit (INDEPENDENT_AMBULATORY_CARE_PROVIDER_SITE_OTHER): Payer: Medicare Other | Admitting: Gastroenterology

## 2015-01-02 ENCOUNTER — Encounter: Payer: Self-pay | Admitting: Gastroenterology

## 2015-01-02 ENCOUNTER — Ambulatory Visit (HOSPITAL_COMMUNITY)
Admission: RE | Admit: 2015-01-02 | Discharge: 2015-01-02 | Disposition: A | Discharge status: Home / Self Care | Payer: Medicare Other | Source: Ambulatory Visit | Attending: Gastroenterology | Admitting: Gastroenterology

## 2015-01-02 ENCOUNTER — Other Ambulatory Visit: Payer: Self-pay

## 2015-01-02 ENCOUNTER — Inpatient Hospital Stay (HOSPITAL_COMMUNITY)
Admission: AD | Admit: 2015-01-02 | Discharge: 2015-01-05 | DRG: 445 | Disposition: A | Discharge status: Other Acute Inpatient Hospital | Payer: Medicare Other | Source: Ambulatory Visit | Attending: Internal Medicine | Admitting: Internal Medicine

## 2015-01-02 ENCOUNTER — Encounter (HOSPITAL_COMMUNITY): Payer: Self-pay | Admitting: *Deleted

## 2015-01-02 VITALS — BP 103/65 | HR 111 | Temp 97.0°F | Ht 69.0 in | Wt 193.8 lb

## 2015-01-02 DIAGNOSIS — D72829 Elevated white blood cell count, unspecified: Secondary | ICD-10-CM | POA: Diagnosis present

## 2015-01-02 DIAGNOSIS — L299 Pruritus, unspecified: Secondary | ICD-10-CM | POA: Diagnosis present

## 2015-01-02 DIAGNOSIS — I129 Hypertensive chronic kidney disease with stage 1 through stage 4 chronic kidney disease, or unspecified chronic kidney disease: Secondary | ICD-10-CM | POA: Diagnosis present

## 2015-01-02 DIAGNOSIS — C259 Malignant neoplasm of pancreas, unspecified: Secondary | ICD-10-CM

## 2015-01-02 DIAGNOSIS — Z9221 Personal history of antineoplastic chemotherapy: Secondary | ICD-10-CM

## 2015-01-02 DIAGNOSIS — K831 Obstruction of bile duct: Secondary | ICD-10-CM | POA: Diagnosis present

## 2015-01-02 DIAGNOSIS — K219 Gastro-esophageal reflux disease without esophagitis: Secondary | ICD-10-CM | POA: Diagnosis present

## 2015-01-02 DIAGNOSIS — N189 Chronic kidney disease, unspecified: Secondary | ICD-10-CM | POA: Diagnosis present

## 2015-01-02 DIAGNOSIS — K315 Obstruction of duodenum: Secondary | ICD-10-CM | POA: Insufficient documentation

## 2015-01-02 DIAGNOSIS — D638 Anemia in other chronic diseases classified elsewhere: Secondary | ICD-10-CM | POA: Diagnosis present

## 2015-01-02 DIAGNOSIS — I10 Essential (primary) hypertension: Secondary | ICD-10-CM | POA: Diagnosis not present

## 2015-01-02 DIAGNOSIS — K869 Disease of pancreas, unspecified: Secondary | ICD-10-CM | POA: Diagnosis not present

## 2015-01-02 DIAGNOSIS — Z800 Family history of malignant neoplasm of digestive organs: Secondary | ICD-10-CM | POA: Diagnosis not present

## 2015-01-02 DIAGNOSIS — Z87891 Personal history of nicotine dependence: Secondary | ICD-10-CM | POA: Diagnosis not present

## 2015-01-02 DIAGNOSIS — N179 Acute kidney failure, unspecified: Secondary | ICD-10-CM | POA: Diagnosis not present

## 2015-01-02 DIAGNOSIS — T85520A Displacement of bile duct prosthesis, initial encounter: Secondary | ICD-10-CM

## 2015-01-02 DIAGNOSIS — K838 Other specified diseases of biliary tract: Secondary | ICD-10-CM | POA: Diagnosis not present

## 2015-01-02 DIAGNOSIS — Z7982 Long term (current) use of aspirin: Secondary | ICD-10-CM

## 2015-01-02 DIAGNOSIS — I2510 Atherosclerotic heart disease of native coronary artery without angina pectoris: Secondary | ICD-10-CM | POA: Diagnosis not present

## 2015-01-02 LAB — BASIC METABOLIC PANEL
ANION GAP: 9 (ref 5–15)
BUN: 29 mg/dL — ABNORMAL HIGH (ref 6–20)
CALCIUM: 9.4 mg/dL (ref 8.9–10.3)
CO2: 24 mmol/L (ref 22–32)
Chloride: 103 mmol/L (ref 101–111)
Creatinine, Ser: 1.99 mg/dL — ABNORMAL HIGH (ref 0.61–1.24)
GFR calc Af Amer: 36 mL/min — ABNORMAL LOW (ref >60)
GFR, EST NON AFRICAN AMERICAN: 31 mL/min — AB (ref >60)
GLUCOSE: 175 mg/dL — AB (ref 65–99)
Potassium: 3.5 mmol/L (ref 3.5–5.1)
Sodium: 136 mmol/L (ref 135–145)

## 2015-01-02 LAB — CBC
HCT: 32.8 % — ABNORMAL LOW (ref 39.0–52.0)
HEMOGLOBIN: 11.2 g/dL — AB (ref 13.0–17.0)
MCH: 32.9 pg (ref 26.0–34.0)
MCHC: 34.1 g/dL (ref 30.0–36.0)
MCV: 96.5 fL (ref 78.0–100.0)
Platelets: 337 10*3/uL (ref 150–400)
RBC: 3.4 MIL/uL — ABNORMAL LOW (ref 4.22–5.81)
RDW: 16.3 % — AB (ref 11.5–15.5)
WBC: 18.4 10*3/uL — ABNORMAL HIGH (ref 4.0–10.5)

## 2015-01-02 LAB — HEPATIC FUNCTION PANEL
ALBUMIN: 2.8 g/dL — AB (ref 3.5–5.0)
ALK PHOS: 338 U/L — AB (ref 38–126)
ALT: 122 U/L — ABNORMAL HIGH (ref 17–63)
AST: 126 U/L — ABNORMAL HIGH (ref 15–41)
Bilirubin, Direct: 6.8 mg/dL — ABNORMAL HIGH (ref 0.1–0.5)
Indirect Bilirubin: 3.4 mg/dL — ABNORMAL HIGH (ref 0.3–0.9)
TOTAL PROTEIN: 7.9 g/dL (ref 6.5–8.1)
Total Bilirubin: 10.2 mg/dL — ABNORMAL HIGH (ref 0.3–1.2)

## 2015-01-02 MED ORDER — HYDROCODONE-ACETAMINOPHEN 5-325 MG PO TABS
1.0000 | ORAL_TABLET | ORAL | Status: DC | PRN
Start: 2015-01-02 — End: 2015-01-05

## 2015-01-02 MED ORDER — SODIUM CHLORIDE 0.9 % IV SOLN: INTRAVENOUS | Status: DC

## 2015-01-02 MED ORDER — DIATRIZOATE MEGLUMINE & SODIUM 66-10 % PO SOLN
ORAL | Status: AC
  Filled 2015-01-02: qty 30

## 2015-01-02 MED ORDER — SODIUM CHLORIDE 0.9 % IV SOLN
INTRAVENOUS | Status: DC
  Administered 2015-01-02: 125 mL/h via INTRAVENOUS
  Administered 2015-01-04 – 2015-01-05 (×2): via INTRAVENOUS

## 2015-01-02 MED ORDER — VITAMIN B-1 100 MG PO TABS
100.0000 mg | ORAL_TABLET | Freq: Every day | ORAL | Status: DC
Start: 2015-01-02 — End: 2015-01-05
  Administered 2015-01-02 – 2015-01-05 (×4): 100 mg via ORAL
  Filled 2015-01-02 (×4): qty 1

## 2015-01-02 MED ORDER — PANTOPRAZOLE SODIUM 40 MG PO TBEC
40.0000 mg | DELAYED_RELEASE_TABLET | Freq: Every day | ORAL | Status: DC
  Administered 2015-01-02 – 2015-01-05 (×4): 40 mg via ORAL
  Filled 2015-01-02 (×4): qty 1

## 2015-01-02 MED ORDER — FAMOTIDINE 20 MG PO TABS
20.0000 mg | ORAL_TABLET | Freq: Every day | ORAL | Status: DC
  Administered 2015-01-02 – 2015-01-05 (×4): 20 mg via ORAL
  Filled 2015-01-02 (×4): qty 1

## 2015-01-02 MED ORDER — ALLOPURINOL 100 MG PO TABS
100.0000 mg | ORAL_TABLET | Freq: Every day | ORAL | Status: DC
  Administered 2015-01-02 – 2015-01-05 (×4): 100 mg via ORAL
  Filled 2015-01-02 (×4): qty 1

## 2015-01-02 MED ORDER — HYDROXYZINE HCL 25 MG PO TABS
25.0000 mg | ORAL_TABLET | Freq: Four times a day (QID) | ORAL | Status: DC | PRN
  Administered 2015-01-02 – 2015-01-05 (×3): 25 mg via ORAL
  Filled 2015-01-02 (×4): qty 1

## 2015-01-02 MED ORDER — PIPERACILLIN-TAZOBACTAM 3.375 G IVPB
3.3750 g | Freq: Three times a day (TID) | INTRAVENOUS | Status: DC
  Administered 2015-01-02 – 2015-01-05 (×9): 3.375 g via INTRAVENOUS
  Filled 2015-01-02 (×9): qty 50

## 2015-01-02 MED ORDER — ATENOLOL 25 MG PO TABS
25.0000 mg | ORAL_TABLET | Freq: Every day | ORAL | Status: DC
  Administered 2015-01-02: 25 mg via ORAL
  Filled 2015-01-02 (×2): qty 1

## 2015-01-02 MED ORDER — HYDRALAZINE HCL 20 MG/ML IJ SOLN: 5.0000 mg | INTRAMUSCULAR | Status: DC | PRN

## 2015-01-02 MED ORDER — ONDANSETRON HCL 4 MG PO TABS: 4.0000 mg | ORAL_TABLET | Freq: Four times a day (QID) | ORAL | Status: DC | PRN

## 2015-01-02 MED ORDER — ONDANSETRON HCL 4 MG/2ML IJ SOLN: 4.0000 mg | Freq: Four times a day (QID) | INTRAMUSCULAR | Status: DC | PRN

## 2015-01-02 NOTE — Assessment & Plan Note (Signed)
79 year old male with history of newly diagnosed pancreatic mass in August 2016, prior ERCP Aug 2016 for obstructive jaundice with plastic stent placed, now with recurrent bump in LFTs and need for repeat ERCP with likely wall stent placement. At time of visit, outside labs reviewed with bilirubin noted at 4, and it has now increased to the 10 range. Leukocytosis also noted, raising concern for evolving cholangitis. Creatinine is also worsened from baseline. CT completed stat after visit in office today, without need for further imaging as clinically and by review of laboratory markers he has obstructive jaundice.   Discussed with Dr. Gala Romney. Will admit to [facility] for IV fluid resuscitation, antibiotic therapy, and ERCP with stent exchange and placement on 01/03/15. I have reviewed with Dr. Anastasio Champion, who is admitting physician. Will place orders for diet, IV fluids, and antibiotics in epic once patient is in the system. Will reassess him again on 12/13 in the morning.

## 2015-01-02 NOTE — Progress Notes (Signed)
Referring Provider: Sharilyn Sites, MD Primary Care Physician:  [name], MD Primary GI: Dr. Oneida Alar   Chief Complaint  Patient presents with  . Follow-up    HPI:   William Rivera is a 79 y.o. male presenting today with a history of a pancreatic head mass, followed at [facility] and last seen there Nov 2016. He was inpatient earlier this year in Aug 2016 with acute pancreatitis and elevated LFTs. Due to obstructive jaundice, ERCP with CBD stent performed August 2016. EUS at [facility] 09/21/14 with adenocarcinoma of pancreas. He was last seen at [facility] by Dr. Hassell Done on 12/14/14, has completed Gem/Abraxane. He has been seen in [place] for consideration of radiation treatment. He was referred to [place] by [facility] and seen as an urgent evaluation due to climbing bilirubin. Outside labs from [facility] on 12/13/14 noted Cr 1.67, Alk Phos 238, AST 94, ALT 108, and bilirubin 1.2. Labs performed in [place] by [facility]cology on 12/9 note bilirubin increased to 4.4, Alk Phos 416, AST 99, Cr 1.67.   Feels nauseated. Jaundiced. Urine is dark. Itching is worse. No abdominal pain. No fever or chills. CT Nov 2016 with stable pancreatic head mass with moderate intra and extrahepatic biliary dilatation and pancreatic ductal dilatation, portal vein and SMV are likely touched by neoplastic tissue but not narrowed. Gastroduodenal artery remains encased on this CT.   Has not started radiation yet.  Has to force himself to eat. Vistaril did not help with itching.   Past Medical History  Diagnosis Date  . Hypertension   . Gout   . Barrett's esophagus     last EGD/Bx 12/11  . Helicobacter pylori gastritis 2000    s/p treatment  . Adenomatous colon polyp 2010  . GERD (gastroesophageal reflux disease)   . GASTRITIS 09/23/2008    Qualifier: Diagnosis of  By: Nicole Kindred LPN, Doris    . Cancer of pancreas, other site AUG 2016    CA 19-9 958.6    Past Surgical History  Procedure Laterality Date  .  Esophagogastroduodenoscopy  2008    DUODENITIS & GASTRITIS 2o TO NSAIDS/ETOH  . Colonoscopy  2010    simple adenomas  . Neck surgery      DISC REPLACED  . Bladder surgery  1610,9604  . Sinus exploration  2000  . Tonsillectomy      AS A CHILD  . Esophagogastroduodenoscopy  12/2009    short segment Barrett's, small hh, chronic gastritis  . Nasal septum surgery    . Esophagogastroduodenoscopy  01/04/2011    [id number] gastritis/Barrett's, possible  . Esophagogastroduodenoscopy N/A 11/30/2012    Dr. Oneida Alar- normal esophagus, empiric dilation d/t c/o dysphagia/?cervical web, stomach= mild non erosive gastritis, inflammation on bx, duodenum= no abnormalities in the bulb and second portion of the duodenum. dilation at the gastroesphageal junction.  Azzie Almas dilation N/A 11/30/2012    Procedure: SAVORY DILATION;  Surgeon: Danie Binder, MD;  [facility];  Service: Endoscopy;  Laterality: N/A;  Venia Minks dilation N/A 11/30/2012    Procedure: Venia Minks DILATION;  Surgeon: Danie Binder, MD;  [facility]: [facility];  Service: Endoscopy;  Laterality: N/A;  . 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 Colonoscopy N/A 07/28/2013    Dr. Oneida Alar: tubular adenoma, mild sigmoid colon diverticulosis, internal hemorhhoids   . Doppler echocardiography  2009  . Nm myoview ltd  2009  . Ercp N/A 09/14/2014    Procedure: ENDOSCOPIC RETROGRADE CHOLANGIOPANCREATOGRAPHY (ERCP);  Surgeon: Mechele Dawley  Laural Golden, MD;  Location: [facility];  Service: Endoscopy;  Laterality: N/A;  With Biliary Brushing  . Biliary stent placement N/A 09/14/2014    Procedure: BILIARY STENT PLACEMENT;  Surgeon: Rogene Houston, MD;  Location: [facility];  Service: Endoscopy;  Laterality: N/A;  . Sphincterotomy N/A 09/14/2014    Procedure: SPHINCTEROTOMY;  Surgeon: Rogene Houston, MD;  Location: [facility];  Service: Endoscopy;  Laterality: N/A;    Current Outpatient Prescriptions  Medication Sig Dispense Refill  . allopurinol (ZYLOPRIM) 100 MG  tablet Take 100 mg by mouth daily.      Marland Kitchen aspirin EC 81 MG tablet Take 81 mg by mouth 3 (three) times a week.    Marland Kitchen atenolol (TENORMIN) 25 MG tablet Take 25 mg by mouth daily.      Marland Kitchen HYDROcodone-acetaminophen (NORCO/VICODIN) 5-325 MG per tablet TK 1 T PO  Q 6 H PRF MODERATE OR SEVERE PAIN  0  . omeprazole (PRILOSEC) 20 MG capsule Take 20 mg by mouth daily.    . ranitidine (ZANTAC 150 MAXIMUM STRENGTH) 150 MG tablet Take 150 mg by mouth as needed for heartburn.    . thiamine 100 MG tablet Take 1 tablet (100 mg total) by mouth daily.    . chlordiazePOXIDE (LIBRIUM) 25 MG capsule TAKE 2-4 PO QHS FOR SLEEP (Patient not taking: Reported on 01/02/2015) 40 capsule 0  . hydrOXYzine (ATARAX/VISTARIL) 25 MG tablet TAKE 3-4 PO QHS TO REDUCE ITCHING (Patient not taking: Reported on 01/02/2015) 42 tablet 0   No current facility-administered medications for this visit.    Allergies as of 01/02/2015 - Review Complete 01/02/2015  Allergen Reaction Noted  . Doxycycline  09/05/2014  . Colestipol Itching 09/14/2014    Family History  Problem Relation Age of Onset  . Stomach cancer Father 57    deceased  . Colon cancer Neg Hx     Social History   Social History  . Marital Status: Married    Spouse Name: N/A  . Number of Children: 1  . Years of Education: N/A   Occupational History  . truck driver    Social History Main Topics  . Smoking status: Former Smoker -- 3.00 packs/day    Types: Cigarettes  . Smokeless tobacco: Never Used     Comment: quit age 60 - 1 pack weekly   . Alcohol Use: 0.0 oz/week    0 Standard drinks or equivalent per week     Comment: drinks about a pint of borboun per week  . Drug Use: No  . Sexual Activity:    Partners: Female    Museum/gallery curator: None   Other Topics Concern  . None   Social History Narrative    Review of Systems: As noted on HPI   Physical Exam: BP 103/65 mmHg  Pulse 111  Temp(Src) 97 F (36.1 C) (Oral)  Ht 5' 9"  (1.753 m)   Wt 193 lb 12.8 oz (87.907 kg)  BMI 28.61 kg/m2 General:   Alert and oriented. Jaundiced Head:  Normocephalic and atraumatic. Eyes:  +scleral icterus Mouth:  Oral mucosa pink and moist.  Heart:  S1, S2 present without murmurs Abdomen:  +BS, soft, TTP LUQ and non-distended. No rebound or guarding.  Msk:  Symmetrical without gross deformities. Normal posture. Extremities:  Without edema. Neurologic:  Alert and  oriented x4;  grossly normal neurologically. Psych:  Alert and cooperative. Normal mood and affect.  Lab Results  Component Value Date   ALT 122* 01/02/2015   AST  126* 01/02/2015   ALKPHOS 338* 01/02/2015   BILITOT 10.2* 01/02/2015   Lab Results  Component Value Date   CREATININE 1.99* 01/02/2015   BUN 29* 01/02/2015   NA 136 01/02/2015   K 3.5 01/02/2015   CL 103 01/02/2015   CO2 24 01/02/2015   Lab Results  Component Value Date   WBC 18.4* 01/02/2015   HGB 11.2* 01/02/2015   HCT 32.8* 01/02/2015   MCV 96.5 01/02/2015   PLT 337 01/02/2015   ASSESSMENT/PLAN:  79 year old male with history of newly diagnosed pancreatic mass in August 2016, prior ERCP Aug 2016 for obstructive jaundice with plastic stent placed, now with recurrent bump in LFTs and need for repeat ERCP with likely wall stent placement. At time of visit, outside labs reviewed with bilirubin noted at 4, and it has now increased to the 10 range. Leukocytosis also noted, raising concern for evolving cholangitis. Creatinine is also worsened from baseline. CT completed stat after visit in office today, without need for further imaging as clinically and by review of laboratory markers he has obstructive jaundice.   Discussed with Dr. Gala Romney. Will admit to [facility] for IV fluid resuscitation, antibiotic therapy, and ERCP with stent exchange and placement on 01/03/15. I have reviewed with Dr. Anastasio Champion, who is admitting physician. Will place orders for diet, IV fluids, and antibiotics in epic once patient is in the  system. Will reassess him again on 12/13 in the morning.   Orvil Feil, ANP-BC [facility]

## 2015-01-02 NOTE — Progress Notes (Signed)
CC'D TO PCP °

## 2015-01-02 NOTE — Patient Instructions (Signed)
We have scheduled you for a CT scan stat.   Please have blood work done as well.  Further recommendations to follow!

## 2015-01-02 NOTE — Progress Notes (Signed)
Notified Dr. Ezzie Dural of the patient itching related to his chemo per the patient.  New orders given and followed.

## 2015-01-02 NOTE — H&P (Signed)
[facility] History and Physical  William Rivera [id number] DOB: 21-Aug-1935 DOA: 01/02/2015  Referring physician: Dr. Cheryln Manly PCP: [name], MD   Chief Complaint: Jaundice  HPI: William Rivera is a 79 y.o. male  This is a 79 year old man who was diagnosed with pancreatic cancer in August of this year and had stent placed in the biliary system to relieve the obstructive jaundice and then followed up with [facility] where he has been having chemotherapy. He completed chemotherapy approximately 3 weeks ago and the plan was to have surgery soon. However, in the last week or so he has become weaker and has had pruritus. He denies any fever or significant abdominal pain. Lab work is shown that he is jaundice today and appears to have obstructive jaundice again. He is now being admitted for further management. He was sent from the gastroenterology office on the plan is for ERCP tomorrow morning.   Review of Systems:  Apart from symptoms above, all systems are negative.  Past Medical History  Diagnosis Date  . Hypertension   . Gout   . Barrett's esophagus     last EGD/Bx 12/11  . Helicobacter pylori gastritis 2000    s/p treatment  . Adenomatous colon polyp 2010  . GERD (gastroesophageal reflux disease)   . GASTRITIS 09/23/2008    Qualifier: Diagnosis of  By: Nicole Kindred LPN, Doris    . Cancer of pancreas, other site AUG 2016    CA 19-9 958.6   Past Surgical History  Procedure Laterality Date  . Esophagogastroduodenoscopy  2008    DUODENITIS & GASTRITIS 2o TO NSAIDS/ETOH  . Colonoscopy  2010    simple adenomas  . Neck surgery      DISC REPLACED  . Bladder surgery  [id number]  . Sinus exploration  2000  . Tonsillectomy      AS A CHILD  . Esophagogastroduodenoscopy  12/2009    short segment Barrett's, small hh, chronic gastritis  . Nasal septum surgery    . Esophagogastroduodenoscopy  01/04/2011    [id number] gastritis/Barrett's, possible  .  Esophagogastroduodenoscopy N/A 11/30/2012    Dr. Oneida Alar- normal esophagus, empiric dilation d/t c/o dysphagia/?cervical web, stomach= mild non erosive gastritis, inflammation on bx, duodenum= no abnormalities in the bulb and second portion of the duodenum. dilation at the gastroesphageal junction.  Azzie Almas dilation N/A 11/30/2012    Procedure: SAVORY DILATION;  Surgeon: Danie Binder, MD;  [facility];  Service: Endoscopy;  Laterality: N/A;  Venia Minks dilation N/A 11/30/2012    Procedure: Venia Minks DILATION;  Surgeon: Danie Binder, MD;  [facility];  Service: Endoscopy;  Laterality: N/A;  . 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 Colonoscopy N/A 07/28/2013    Dr. Oneida Alar: tubular adenoma, mild sigmoid colon diverticulosis, internal hemorhhoids   . Doppler echocardiography  2009  . Nm myoview ltd  2009  . Ercp N/A 09/14/2014    Dr. Laural Golden: sphincterotomy with 31 F plastic biliary stent placement   . Biliary stent placement N/A 09/14/2014    Procedure: BILIARY STENT PLACEMENT;  Surgeon: Rogene Houston, MD;  Location: [facility];  Service: Endoscopy;  Laterality: N/A;  . Sphincterotomy N/A 09/14/2014    Procedure: SPHINCTEROTOMY;  Surgeon: Rogene Houston, MD;  Location: [facility];  Service: Endoscopy;  Laterality: N/A;   Social History:  reports that he has quit smoking. His smoking use included Cigarettes. He smoked 3.00 packs per day. He has never used smokeless tobacco. He  reports that he drinks alcohol. He reports that he does not use illicit drugs.  Allergies  Allergen Reactions  . Doxycycline     rash  . Colestipol Itching    Family History  Problem Relation Age of Onset  . Stomach cancer Father 59    deceased  . Colon cancer Neg Hx     Prior to Admission medications   Medication Sig Start Date End Date Taking? Authorizing Provider  allopurinol (ZYLOPRIM) 100 MG tablet Take 100 mg by mouth daily.      Historical Provider, MD  aspirin EC 81 MG tablet Take 81 mg  by mouth 3 (three) times a week.    Historical Provider, MD  atenolol (TENORMIN) 25 MG tablet Take 25 mg by mouth daily.      Historical Provider, MD  HYDROcodone-acetaminophen (NORCO/VICODIN) 5-325 MG per tablet TK 1 T PO  Q 6 H PRF MODERATE OR SEVERE PAIN 09/27/14   Historical Provider, MD  omeprazole (PRILOSEC) 20 MG capsule Take 20 mg by mouth daily.    Historical Provider, MD  ranitidine (ZANTAC 150 MAXIMUM STRENGTH) 150 MG tablet Take 150 mg by mouth as needed for heartburn.    Historical Provider, MD  thiamine 100 MG tablet Take 1 tablet (100 mg total) by mouth daily. 09/15/14   Erline Hau, MD   Physical Exam: There were no vitals filed for this visit.  Wt Readings from Last 3 Encounters:  01/02/15 87.907 kg (193 lb 12.8 oz)  09/29/14 90.901 kg (200 lb 6.4 oz)  09/14/14 98.884 kg (218 lb)    General:  Appears jaundiced. Eyes: PERRL, normal lids, irises & conjunctiva ENT: grossly normal hearing, lips & tongue Neck: no LAD, masses or thyromegaly Cardiovascular: RRR, no m/r/g. No LE edema. Telemetry: SR, no arrhythmias  Respiratory: CTA bilaterally, no w/r/r. Normal respiratory effort. Abdomen: soft, ntnd Skin: no rash or induration seen on limited exam Musculoskeletal: grossly normal tone BUE/BLE Psychiatric: grossly normal mood and affect, speech fluent and appropriate Neurologic: grossly non-focal.          Labs on Admission:  Basic Metabolic Panel:  Recent Labs Lab 01/02/15 1241  NA 136  K 3.5  CL 103  CO2 24  GLUCOSE 175*  BUN 29*  CREATININE 1.99*  CALCIUM 9.4   Liver Function Tests:  Recent Labs Lab 01/02/15 1241  AST 126*  ALT 122*  ALKPHOS 338*  BILITOT 10.2*  PROT 7.9  ALBUMIN 2.8*   No results for input(s): LIPASE, AMYLASE in the last 168 hours. No results for input(s): AMMONIA in the last 168 hours. CBC:  Recent Labs Lab 01/02/15 1241  WBC 18.4*  HGB 11.2*  HCT 32.8*  MCV 96.5  PLT 337   Cardiac Enzymes: No results  for input(s): CKTOTAL, CKMB, CKMBINDEX, TROPONINI in the last 168 hours.  BNP (last 3 results) No results for input(s): BNP in the last 8760 hours.  ProBNP (last 3 results) No results for input(s): PROBNP in the last 8760 hours.  CBG: No results for input(s): GLUCAP in the last 168 hours.  Radiological Exams on Admission: Ct Abdomen Pelvis Wo Contrast  01/02/2015  CLINICAL DATA:  79 year old male with history of adenocarcinoma of the pancreatic head status post stent placement in the common bile duct. Jaundice. EXAM: CT ABDOMEN AND PELVIS WITHOUT CONTRAST TECHNIQUE: Multidetector CT imaging of the abdomen and pelvis was performed following the standard protocol without IV contrast. COMPARISON:  MRI of the abdomen 09/06/2014. FINDINGS: Lower chest: Small  nodules in the lung bases bilaterally, largest of which is in the right lower lobe where there is a pleural-based 15 x 8 mm nodule (image 14 of series 3). Atherosclerotic calcifications in the left main, left anterior descending and right coronary arteries. Hepatobiliary: 9 mm low attenuation lesion in segment 4A, previously characterized as a small simple cyst. No other definite suspicious hepatic lesions are identified on today's noncontrast CT examination. There is a common bile duct stent in place, the tip of which extends into the second portion of the duodenum. There appears to be some residual intrahepatic biliary ductal dilatation (difficult to judge on today's noncontrast CT). Gallbladder is moderately distended. Pancreas: Fullness in the pancreatic head, without clearly discernible mass. Body and tail of the pancreas are unremarkable in appearance on today's noncontrast CT examination. Spleen: Small splenule inferior to the splenic hilum. Otherwise, unremarkable. Adrenals/Urinary Tract: Left renal atrophy. Multifocal cortical thinning in the kidneys bilaterally (left greater than right), presumably reflective of chronic post infectious or  inflammatory scarring. No discrete renal lesions are confidently identified on today's noncontrast CT examination. No hydroureteronephrosis. Urinary bladder is unremarkable in appearance. Stomach/Bowel: Unenhanced appearance of the stomach is normal. No pathologic dilatation of small bowel or colon. Normal appendix. Vascular/Lymphatic: Atherosclerosis throughout the abdominal and pelvic vasculature, without evidence of aneurysm or dissection. No lymphadenopathy noted in the abdomen or pelvis on today's noncontrast CT examination. Reproductive: Prostate gland and seminal vesicles are unremarkable in appearance. Other: No significant volume of ascites.  No pneumoperitoneum. Musculoskeletal: There are no aggressive appearing lytic or blastic lesions noted in the visualized portions of the skeleton. IMPRESSION: 1. Common bile duct stent in position and appears properly located. There appears likely to be some residual intra and extrahepatic biliary ductal dilatation, which is difficult to judge on today's noncontrast CT examination. 2. Mild fullness in the pancreatic head corresponding to reported pancreatic mass. This is incompletely evaluated on today's noncontrast CT examination. 3. Atherosclerosis, including left main and 2 vessel cord nearly artery disease. 4. Additional incidental findings, as above. Electronically Signed   By: Vinnie Langton M.D.   On: 01/02/2015 14:45      Assessment/Plan   1. Obstructive jaundice. He will require ERCP tomorrow morning and gastroenterology will be doing this. Nothing by mouth after midnight. 2. Hypertension. Stable. Hydralazine IV when necessary. 3. Adenocarcinoma of the pancreas. This is responsible for #1.  He'll be admitted to the medical floor. Further recommendations will depend on patient's hospital progress.   Code Status: Full code.   DVT Prophylaxis: SCDs . Family Communication: I discussed the plan with the patient at the bedside.  Disposition  Plan: Home when medically stable.  Time spent: 45 minutes.  Doree Albee [facility] Pager [phone].

## 2015-01-03 DIAGNOSIS — C259 Malignant neoplasm of pancreas, unspecified: Secondary | ICD-10-CM

## 2015-01-03 DIAGNOSIS — K838 Other specified diseases of biliary tract: Secondary | ICD-10-CM

## 2015-01-03 LAB — HEPATIC FUNCTION PANEL
ALBUMIN: 2.3 g/dL — AB (ref 3.5–5.0)
ALT: 89 U/L — AB (ref 17–63)
AST: 82 U/L — AB (ref 15–41)
Alkaline Phosphatase: 248 U/L — ABNORMAL HIGH (ref 38–126)
BILIRUBIN DIRECT: 3.1 mg/dL — AB (ref 0.1–0.5)
Indirect Bilirubin: 2 mg/dL — ABNORMAL HIGH (ref 0.3–0.9)
Total Bilirubin: 5.1 mg/dL — ABNORMAL HIGH (ref 0.3–1.2)
Total Protein: 6.2 g/dL — ABNORMAL LOW (ref 6.5–8.1)

## 2015-01-03 LAB — CBC
HEMATOCRIT: 28 % — AB (ref 39.0–52.0)
HEMOGLOBIN: 9.3 g/dL — AB (ref 13.0–17.0)
MCH: 32.1 pg (ref 26.0–34.0)
MCHC: 33.2 g/dL (ref 30.0–36.0)
MCV: 96.6 fL (ref 78.0–100.0)
Platelets: 255 10*3/uL (ref 150–400)
RBC: 2.9 MIL/uL — AB (ref 4.22–5.81)
RDW: 16.5 % — ABNORMAL HIGH (ref 11.5–15.5)
WBC: 10.7 10*3/uL — ABNORMAL HIGH (ref 4.0–10.5)

## 2015-01-03 LAB — BASIC METABOLIC PANEL
Anion gap: 6 (ref 5–15)
BUN: 30 mg/dL — AB (ref 6–20)
CHLORIDE: 109 mmol/L (ref 101–111)
CO2: 23 mmol/L (ref 22–32)
CREATININE: 1.88 mg/dL — AB (ref 0.61–1.24)
Calcium: 8.8 mg/dL — ABNORMAL LOW (ref 8.9–10.3)
GFR calc Af Amer: 38 mL/min — ABNORMAL LOW (ref >60)
GFR calc non Af Amer: 33 mL/min — ABNORMAL LOW (ref >60)
GLUCOSE: 115 mg/dL — AB (ref 65–99)
POTASSIUM: 3.2 mmol/L — AB (ref 3.5–5.1)
Sodium: 138 mmol/L (ref 135–145)

## 2015-01-03 NOTE — Progress Notes (Signed)
[facility] PROGRESS NOTE  William Rivera [id number] DOB: 06-12-1935 DOA: 01/02/2015 PCP: [name], MD  Brief narrative: 79 year old man who was diagnosed with pancreatic cancer in August of this year and had stent placed in the biliary system to relieve the obstructive jaundice and then followed up with [facility] where he has been having chemotherapy. He completed chemotherapy approximately 3 weeks ago. Presented to the hospital this admission complaining of pruritis and jaundice   Assessment/Plan: Obstructive jaundice -Gastroenterologist on board and managing. Patient currently undergoing workup. Please refer to note on 01/03/15 - We'll defer continuation of Zosyn to GI  Active Problems:   Essential hypertension -Relatively stable on atenolol    Adenocarcinoma of pancreas (HCC) -Most likely Cause of principal problem    Code Status: Full  Family Communication: None at bedside Disposition Plan: Pending improvement in condition.   Consultants:  GI  Procedures:  None  Antibiotics:  Zosyn  HPI/Subjective: Pt has no new complaints. Still complaining of some pruritus  Objective: Filed Vitals:   01/02/15 2023 01/03/15 0546  BP: 97/59 92/53  Pulse: 91 90  Temp: 97.9 F (36.6 C) 97.8 F (36.6 C)  Resp: 18 20    Intake/Output Summary (Last 24 hours) at 01/03/15 1238 Last data filed at 01/02/15 1900  Gross per 24 hour  Intake    240 ml  Output      0 ml  Net    240 ml   Filed Weights   01/02/15 1636  Weight: 88.1 kg (194 lb 3.6 oz)    Exam:   General:  Patient in no acute distress, alert and awake  Cardiovascular: Regular rate and rhythm, no murmurs or rubs  Respiratory: No increased work of breathing, no wheezes  Abdomen: Soft, nondistended, no guarding  Musculoskeletal: No cyanosis or clubbing   Data Reviewed: Basic Metabolic Panel:  Recent Labs Lab 01/02/15 1241 01/03/15 0600  NA 136 138  K 3.5 3.2*  CL 103 109   CO2 24 23  GLUCOSE 175* 115*  BUN 29* 30*  CREATININE 1.99* 1.88*  CALCIUM 9.4 8.8*   Liver Function Tests:  Recent Labs Lab 01/02/15 1241 01/03/15 0600  AST 126* 82*  ALT 122* 89*  ALKPHOS 338* 248*  BILITOT 10.2* 5.1*  PROT 7.9 6.2*  ALBUMIN 2.8* 2.3*   No results for input(s): LIPASE, AMYLASE in the last 168 hours. No results for input(s): AMMONIA in the last 168 hours. CBC:  Recent Labs Lab 01/02/15 1241 01/03/15 0600  WBC 18.4* 10.7*  HGB 11.2* 9.3*  HCT 32.8* 28.0*  MCV 96.5 96.6  PLT 337 255   Cardiac Enzymes: No results for input(s): CKTOTAL, CKMB, CKMBINDEX, TROPONINI in the last 168 hours. BNP (last 3 results) No results for input(s): BNP in the last 8760 hours.  ProBNP (last 3 results) No results for input(s): PROBNP in the last 8760 hours.  CBG: No results for input(s): GLUCAP in the last 168 hours.  No results found for this or any previous visit (from the past 240 hour(s)).   Studies: Ct Abdomen Pelvis Wo Contrast  01/02/2015  CLINICAL DATA:  79 year old male with history of adenocarcinoma of the pancreatic head status post stent placement in the common bile duct. Jaundice. EXAM: CT ABDOMEN AND PELVIS WITHOUT CONTRAST TECHNIQUE: Multidetector CT imaging of the abdomen and pelvis was performed following the standard protocol without IV contrast. COMPARISON:  MRI of the abdomen 09/06/2014. FINDINGS: Lower chest: Small nodules in the lung bases bilaterally, largest  of which is in the right lower lobe where there is a pleural-based 15 x 8 mm nodule (image 14 of series 3). Atherosclerotic calcifications in the left main, left anterior descending and right coronary arteries. Hepatobiliary: 9 mm low attenuation lesion in segment 4A, previously characterized as a small simple cyst. No other definite suspicious hepatic lesions are identified on today's noncontrast CT examination. There is a common bile duct stent in place, the tip of which extends into the  second portion of the duodenum. There appears to be some residual intrahepatic biliary ductal dilatation (difficult to judge on today's noncontrast CT). Gallbladder is moderately distended. Pancreas: Fullness in the pancreatic head, without clearly discernible mass. Body and tail of the pancreas are unremarkable in appearance on today's noncontrast CT examination. Spleen: Small splenule inferior to the splenic hilum. Otherwise, unremarkable. Adrenals/Urinary Tract: Left renal atrophy. Multifocal cortical thinning in the kidneys bilaterally (left greater than right), presumably reflective of chronic post infectious or inflammatory scarring. No discrete renal lesions are confidently identified on today's noncontrast CT examination. No hydroureteronephrosis. Urinary bladder is unremarkable in appearance. Stomach/Bowel: Unenhanced appearance of the stomach is normal. No pathologic dilatation of small bowel or colon. Normal appendix. Vascular/Lymphatic: Atherosclerosis throughout the abdominal and pelvic vasculature, without evidence of aneurysm or dissection. No lymphadenopathy noted in the abdomen or pelvis on today's noncontrast CT examination. Reproductive: Prostate gland and seminal vesicles are unremarkable in appearance. Other: No significant volume of ascites.  No pneumoperitoneum. Musculoskeletal: There are no aggressive appearing lytic or blastic lesions noted in the visualized portions of the skeleton. IMPRESSION: 1. Common bile duct stent in position and appears properly located. There appears likely to be some residual intra and extrahepatic biliary ductal dilatation, which is difficult to judge on today's noncontrast CT examination. 2. Mild fullness in the pancreatic head corresponding to reported pancreatic mass. This is incompletely evaluated on today's noncontrast CT examination. 3. Atherosclerosis, including left main and 2 vessel cord nearly artery disease. 4. Additional incidental findings, as above.  Electronically Signed   By: Vinnie Langton M.D.   On: 01/02/2015 14:45    Scheduled Meds: . allopurinol  100 mg Oral Daily  . atenolol  25 mg Oral Daily  . famotidine  20 mg Oral Daily  . pantoprazole  40 mg Oral Daily  . piperacillin-tazobactam (ZOSYN)  IV  3.375 g Intravenous Q8H  . thiamine  100 mg Oral Daily   Continuous Infusions: . sodium chloride 125 mL/hr (01/02/15 1921)    Time spent: *> 20 minutes   Velvet Bathe  [facility] Pager [phone] If 7PM-7AM, please contact night-coverage at [email or web address], password [facility] 01/03/2015, 12:38 PM  LOS: 1 day

## 2015-01-03 NOTE — Progress Notes (Signed)
Subjective: Denies abdominal pain, no N/V. Continues to report itching.   Objective: Vital signs in last 24 hours: Temp:  [97 F (36.1 C)-97.9 F (36.6 C)] 97.8 F (36.6 C) (12/13 0546) Pulse Rate:  [90-111] 90 (12/13 0546) Resp:  [18-20] 20 (12/13 0546) BP: (92-103)/(53-65) 92/53 mmHg (12/13 0546) SpO2:  [94 %-98 %] 94 % (12/13 0546) Weight:  [193 lb 12.8 oz (87.907 kg)-194 lb 3.6 oz (88.1 kg)] 194 lb 3.6 oz (88.1 kg) (12/12 1636)   General:   Alert and oriented, pleasant, jaundiced Head:  Normocephalic and atraumatic. Eyes:  +scleral icterus  Abdomen:  Bowel sounds present, soft, very mild TTP LUQ, no rebound or guarding Msk:  Symmetrical without gross deformities. Normal posture. Extremities:  Without edema. Neurologic:  Alert and  oriented x4 Psych:  Alert and cooperative. Normal mood and affect.  Intake/Output from previous day: 12/12 0701 - 12/13 0700 In: 240 [P.O.:240] Out: -  Intake/Output this shift:    Lab Results:  Recent Labs  01/02/15 1241 01/03/15 0600  WBC 18.4* 10.7*  HGB 11.2* 9.3*  HCT 32.8* 28.0*  PLT 337 255   BMET  Recent Labs  01/02/15 1241 01/03/15 0600  NA 136 138  K 3.5 3.2*  CL 103 109  CO2 24 23  GLUCOSE 175* 115*  BUN 29* 30*  CREATININE 1.99* 1.88*  CALCIUM 9.4 8.8*   LFT  Recent Labs  01/02/15 1241 01/03/15 0600  PROT 7.9 6.2*  ALBUMIN 2.8* 2.3*  AST 126* 82*  ALT 122* 89*  ALKPHOS 338* 248*  BILITOT 10.2* 5.1*  BILIDIR 6.8* 3.1*  IBILI 3.4* 2.0*     Studies/Results: Ct Abdomen Pelvis Wo Contrast  01/02/2015  CLINICAL DATA:  79 year old male with history of adenocarcinoma of the pancreatic head status post stent placement in the common bile duct. Jaundice. EXAM: CT ABDOMEN AND PELVIS WITHOUT CONTRAST TECHNIQUE: Multidetector CT imaging of the abdomen and pelvis was performed following the standard protocol without IV contrast. COMPARISON:  MRI of the abdomen 09/06/2014. FINDINGS: Lower chest: Small  nodules in the lung bases bilaterally, largest of which is in the right lower lobe where there is a pleural-based 15 x 8 mm nodule (image 14 of series 3). Atherosclerotic calcifications in the left main, left anterior descending and right coronary arteries. Hepatobiliary: 9 mm low attenuation lesion in segment 4A, previously characterized as a small simple cyst. No other definite suspicious hepatic lesions are identified on today's noncontrast CT examination. There is a common bile duct stent in place, the tip of which extends into the second portion of the duodenum. There appears to be some residual intrahepatic biliary ductal dilatation (difficult to judge on today's noncontrast CT). Gallbladder is moderately distended. Pancreas: Fullness in the pancreatic head, without clearly discernible mass. Body and tail of the pancreas are unremarkable in appearance on today's noncontrast CT examination. Spleen: Small splenule inferior to the splenic hilum. Otherwise, unremarkable. Adrenals/Urinary Tract: Left renal atrophy. Multifocal cortical thinning in the kidneys bilaterally (left greater than right), presumably reflective of chronic post infectious or inflammatory scarring. No discrete renal lesions are confidently identified on today's noncontrast CT examination. No hydroureteronephrosis. Urinary bladder is unremarkable in appearance. Stomach/Bowel: Unenhanced appearance of the stomach is normal. No pathologic dilatation of small bowel or colon. Normal appendix. Vascular/Lymphatic: Atherosclerosis throughout the abdominal and pelvic vasculature, without evidence of aneurysm or dissection. No lymphadenopathy noted in the abdomen or pelvis on today's noncontrast CT examination. Reproductive: Prostate gland and seminal vesicles are  unremarkable in appearance. Other: No significant volume of ascites.  No pneumoperitoneum. Musculoskeletal: There are no aggressive appearing lytic or blastic lesions noted in the visualized  portions of the skeleton. IMPRESSION: 1. Common bile duct stent in position and appears properly located. There appears likely to be some residual intra and extrahepatic biliary ductal dilatation, which is difficult to judge on today's noncontrast CT examination. 2. Mild fullness in the pancreatic head corresponding to reported pancreatic mass. This is incompletely evaluated on today's noncontrast CT examination. 3. Atherosclerosis, including left main and 2 vessel cord nearly artery disease. 4. Additional incidental findings, as above. Electronically Signed   By: Vinnie Langton M.D.   On: 01/02/2015 14:45    Assessment/Plan: 79 year old male with history of newly diagnosed pancreatic mass in August 2016, prior ERCP Aug 2016 for obstructive jaundice with plastic stent placed, seen as an outpatient yesterday with notable bump in LFTs and obstructive jaundice. At time of visit, outside labs reviewed with bilirubin noted at 4, and it had increased to the 10 range. Leukocytosis also noted, raising concern for evolving cholangitis. Creatinine was also worsened from baseline. Admitted from clinic for supportive measures and likely ERCP with stent exchange. Upon of review of notes from [facility], he was started on chemo 10/20/14 (Gem/Abraxane) and referred to [facility] for chemoradiation for cytoreduction.   Clinically, he is without abdominal pain but notes persistent itching as expected. LFTs have improved and creatinine is slowly improving with fluid resuscitation. Will keep him on Zosyn empirically for now. As last stent was placed in August, it is time for stent exchange but further discussion with Dr. Tia Masker at [facility] needs to be undertaken regarding ultimate goal, which will help decide type of stent. Dr. Oneida Alar to discuss with Dr. Tia Masker.   In interim, no ERCP today but will advance diet to low fat now. Anticipate ERCP in next 24 hours with stent removal/exchange.    Orvil Feil, ANP-BC [facility]  Gastroenterology    LOS: 1 day    01/03/2015, 7:45 AM

## 2015-01-03 NOTE — Care Management Note (Signed)
Case Management Note  Patient Details  Name: William Rivera MRN: [id number] Date of Birth: [birth year]  Subjective/Objective:                  Pt is from home, lives with wife and is ind with ADL's. Pt has no HH services or DME needs prior to admission.   Action/Plan: Pt plans to return home with self care at DC. Pt up in chair at present. No CM needs.  Expected Discharge Date:    01/05/2015              Expected Discharge Plan:  Home/Self Care  In-House Referral:  NA  Discharge planning Services  CM Consult  Post Acute Care Choice:  NA Choice offered to:  NA  DME Arranged:    DME Agency:     HH Arranged:    HH Agency:     Status of Service:  Completed, signed off  Medicare Important Message Given:    Date Medicare IM Given:    Medicare IM give by:    Date Additional Medicare IM Given:    Additional Medicare Important Message give by:     If discussed at [place] of Stay Meetings, dates discussed:    Additional Comments:  Sherald Barge, RN 01/03/2015, 11:44 AM

## 2015-01-04 ENCOUNTER — Encounter (HOSPITAL_COMMUNITY)
Admission: AD | Disposition: A | Discharge status: Other Acute Inpatient Hospital | Payer: Self-pay | Source: Ambulatory Visit | Attending: Internal Medicine

## 2015-01-04 ENCOUNTER — Inpatient Hospital Stay (HOSPITAL_COMMUNITY): Payer: Medicare Other | Admitting: Anesthesiology

## 2015-01-04 ENCOUNTER — Encounter (HOSPITAL_COMMUNITY): Payer: Self-pay | Admitting: *Deleted

## 2015-01-04 ENCOUNTER — Inpatient Hospital Stay (HOSPITAL_COMMUNITY): Payer: Medicare Other

## 2015-01-04 DIAGNOSIS — N179 Acute kidney failure, unspecified: Secondary | ICD-10-CM

## 2015-01-04 DIAGNOSIS — K315 Obstruction of duodenum: Secondary | ICD-10-CM | POA: Insufficient documentation

## 2015-01-04 HISTORY — PX: ERCP: SHX5425

## 2015-01-04 HISTORY — PX: ESOPHAGOGASTRODUODENOSCOPY (EGD) WITH PROPOFOL: SHX5813

## 2015-01-04 LAB — CBC
HCT: 30.9 % — ABNORMAL LOW (ref 39.0–52.0)
HEMOGLOBIN: 10.4 g/dL — AB (ref 13.0–17.0)
MCH: 32.6 pg (ref 26.0–34.0)
MCHC: 33.7 g/dL (ref 30.0–36.0)
MCV: 96.9 fL (ref 78.0–100.0)
PLATELETS: 327 10*3/uL (ref 150–400)
RBC: 3.19 MIL/uL — ABNORMAL LOW (ref 4.22–5.81)
RDW: 16.6 % — ABNORMAL HIGH (ref 11.5–15.5)
WBC: 11.7 10*3/uL — ABNORMAL HIGH (ref 4.0–10.5)

## 2015-01-04 LAB — COMPREHENSIVE METABOLIC PANEL
ALT: 83 U/L — AB (ref 17–63)
AST: 70 U/L — AB (ref 15–41)
Albumin: 2.5 g/dL — ABNORMAL LOW (ref 3.5–5.0)
Alkaline Phosphatase: 265 U/L — ABNORMAL HIGH (ref 38–126)
Anion gap: 5 (ref 5–15)
BUN: 27 mg/dL — AB (ref 6–20)
CHLORIDE: 112 mmol/L — AB (ref 101–111)
CO2: 23 mmol/L (ref 22–32)
CREATININE: 1.99 mg/dL — AB (ref 0.61–1.24)
Calcium: 9 mg/dL (ref 8.9–10.3)
GFR calc non Af Amer: 31 mL/min — ABNORMAL LOW (ref >60)
GFR, EST AFRICAN AMERICAN: 36 mL/min — AB (ref >60)
Glucose, Bld: 111 mg/dL — ABNORMAL HIGH (ref 65–99)
POTASSIUM: 4 mmol/L (ref 3.5–5.1)
SODIUM: 140 mmol/L (ref 135–145)
Total Bilirubin: 4.4 mg/dL — ABNORMAL HIGH (ref 0.3–1.2)
Total Protein: 6.9 g/dL (ref 6.5–8.1)

## 2015-01-04 SURGERY — ESOPHAGOGASTRODUODENOSCOPY (EGD) WITH PROPOFOL
Anesthesia: General

## 2015-01-04 MED ORDER — LIDOCAINE HCL 1 % IJ SOLN
INTRAMUSCULAR | Status: DC | PRN
  Administered 2015-01-04: 25 mg via INTRADERMAL

## 2015-01-04 MED ORDER — ROCURONIUM BROMIDE 50 MG/5ML IV SOLN
INTRAVENOUS | Status: AC
  Filled 2015-01-04: qty 1

## 2015-01-04 MED ORDER — MIDAZOLAM HCL 5 MG/5ML IJ SOLN
INTRAMUSCULAR | Status: DC | PRN
  Administered 2015-01-04: 1 mg via INTRAVENOUS

## 2015-01-04 MED ORDER — MIDAZOLAM HCL 2 MG/2ML IJ SOLN
INTRAMUSCULAR | Status: AC
  Filled 2015-01-04: qty 2

## 2015-01-04 MED ORDER — PROPOFOL 10 MG/ML IV BOLUS
INTRAVENOUS | Status: DC | PRN
  Administered 2015-01-04: 130 mg via INTRAVENOUS

## 2015-01-04 MED ORDER — SODIUM CHLORIDE 0.9 % IV SOLN
INTRAVENOUS | Status: DC | PRN
  Administered 2015-01-04: 100 mL

## 2015-01-04 MED ORDER — FENTANYL CITRATE (PF) 100 MCG/2ML IJ SOLN
INTRAMUSCULAR | Status: AC
  Filled 2015-01-04: qty 2

## 2015-01-04 MED ORDER — ONDANSETRON HCL 4 MG/2ML IJ SOLN: 4.0000 mg | Freq: Once | INTRAMUSCULAR | Status: DC | PRN

## 2015-01-04 MED ORDER — FENTANYL CITRATE (PF) 100 MCG/2ML IJ SOLN: 25.0000 ug | INTRAMUSCULAR | Status: DC | PRN

## 2015-01-04 MED ORDER — STERILE WATER FOR IRRIGATION IR SOLN
Status: DC | PRN
  Administered 2015-01-04: 1000 mL

## 2015-01-04 MED ORDER — LACTATED RINGERS IV SOLN: INTRAVENOUS | Status: DC

## 2015-01-04 MED ORDER — SODIUM CHLORIDE 0.9 % IV SOLN
INTRAVENOUS | Status: AC
  Administered 2015-01-04: 13:00:00 via INTRAVENOUS
  Filled 2015-01-04: qty 100

## 2015-01-04 MED ORDER — EPHEDRINE SULFATE 50 MG/ML IJ SOLN
INTRAMUSCULAR | Status: DC | PRN
  Administered 2015-01-04: 10 mg via INTRAVENOUS
  Administered 2015-01-04: 5 mg via INTRAVENOUS
  Administered 2015-01-04: 10 mg via INTRAVENOUS

## 2015-01-04 MED ORDER — GLYCOPYRROLATE 0.2 MG/ML IJ SOLN
0.2000 mg | Freq: Once | INTRAMUSCULAR | Status: AC
Start: 2015-01-04 — End: 2015-01-04
  Administered 2015-01-04: 0.2 mg via INTRAVENOUS

## 2015-01-04 MED ORDER — ROCURONIUM BROMIDE 100 MG/10ML IV SOLN
INTRAVENOUS | Status: DC | PRN
  Administered 2015-01-04: 5 mg via INTRAVENOUS

## 2015-01-04 MED ORDER — SUCCINYLCHOLINE CHLORIDE 20 MG/ML IJ SOLN
INTRAMUSCULAR | Status: DC | PRN
  Administered 2015-01-04: 150 mg via INTRAVENOUS

## 2015-01-04 MED ORDER — ONDANSETRON HCL 4 MG/2ML IJ SOLN
4.0000 mg | Freq: Once | INTRAMUSCULAR | Status: AC
  Administered 2015-01-04: 4 mg via INTRAVENOUS

## 2015-01-04 MED ORDER — GLYCOPYRROLATE 0.2 MG/ML IJ SOLN
INTRAMUSCULAR | Status: AC
  Filled 2015-01-04: qty 1

## 2015-01-04 MED ORDER — GLUCAGON HCL RDNA (DIAGNOSTIC) 1 MG IJ SOLR
INTRAMUSCULAR | Status: AC
  Filled 2015-01-04: qty 1

## 2015-01-04 MED ORDER — LIDOCAINE HCL (PF) 1 % IJ SOLN
INTRAMUSCULAR | Status: AC
  Filled 2015-01-04: qty 5

## 2015-01-04 MED ORDER — ONDANSETRON HCL 4 MG/2ML IJ SOLN
INTRAMUSCULAR | Status: AC
  Filled 2015-01-04: qty 2

## 2015-01-04 MED ORDER — MIDAZOLAM HCL 2 MG/2ML IJ SOLN
1.0000 mg | INTRAMUSCULAR | Status: DC | PRN
  Administered 2015-01-04: 2 mg via INTRAVENOUS

## 2015-01-04 SURGICAL SUPPLY — 25 items
BALLN RETRIEVAL 12X15 (BALLOONS) IMPLANT
BALLN RETRIEVAL 12X15MM (BALLOONS)
BASKET TRAPEZOID 3X6 (MISCELLANEOUS) IMPLANT
BASKET TRAPEZOID LITHO 2.5X5 (MISCELLANEOUS) IMPLANT
BLOCK BITE 60FR ADLT L/F BLUE (MISCELLANEOUS) IMPLANT
DEVICE INFLATION ENCORE 26 (MISCELLANEOUS) IMPLANT
DEVICE LOCKING W-BIOPSY CAP (MISCELLANEOUS) ×3 IMPLANT
FLOOR PAD 36X40 (MISCELLANEOUS) ×3
GUIDEWIRE HYDRA JAGWIRE .35 (WIRE) IMPLANT
GUIDEWIRE JAG HINI 025X260CM (WIRE) IMPLANT
KIT ENDO PROCEDURE PEN (KITS) ×3 IMPLANT
PAD FLOOR 36X40 (MISCELLANEOUS) ×1 IMPLANT
SCOPE SPY DS DISPOSABLE (MISCELLANEOUS) ×3 IMPLANT
SNARE ROTATE MED OVAL 20MM (MISCELLANEOUS) ×3 IMPLANT
SNARE SHORT THROW 13M SML OVAL (MISCELLANEOUS) IMPLANT
SNARE SHORT THROW 27M MED OVAL (MISCELLANEOUS) IMPLANT
SPHINCTEROTOME AUTOTOME .25 (MISCELLANEOUS) IMPLANT
SPHINCTEROTOME HYDRATOME 44 (MISCELLANEOUS) ×3 IMPLANT
SYR 20CC LL (SYRINGE) ×3 IMPLANT
SYR 50ML LL SCALE MARK (SYRINGE) ×6 IMPLANT
SYSTEM CONTINUOUS INJECTION (MISCELLANEOUS) ×3 IMPLANT
TUBING INSUFFLATOR CO2MPACT (TUBING) ×3 IMPLANT
WALLSTENT METAL COVERED 10X60 (STENTS) IMPLANT
WALLSTENT METAL COVERED 10X80 (STENTS) IMPLANT
WATER STERILE IRR 1000ML POUR (IV SOLUTION) ×6 IMPLANT

## 2015-01-04 NOTE — Anesthesia Postprocedure Evaluation (Signed)
Anesthesia Post Note  Patient: William Rivera  Procedure(s) Performed: Procedure(s) (LRB): ESOPHAGOGASTRODUODENOSCOPY (EGD) WITH PROPOFOL (N/A)  Patient location during evaluation: PACU Anesthesia Type: General Level of consciousness: awake and patient cooperative Pain management: pain level controlled Vital Signs Assessment: post-procedure vital signs reviewed and stable Respiratory status: spontaneous breathing, nonlabored ventilation and patient connected to nasal cannula oxygen Cardiovascular status: blood pressure returned to baseline Postop Assessment: no signs of nausea or vomiting Anesthetic complications: no    Last Vitals:  Filed Vitals:   01/04/15 1410 01/04/15 1552  BP: 121/72 115/62  Pulse:    Temp:  36.8 C  Resp: 14 18    Last Pain: There were no vitals filed for this visit.               Mikaylah Libbey J

## 2015-01-04 NOTE — Anesthesia Preprocedure Evaluation (Signed)
Anesthesia Evaluation  Patient identified by MRN, date of birth, ID band Patient awake    Reviewed: Allergy & Precautions, NPO status , Patient's Chart, lab work & pertinent test results, reviewed documented beta blocker date and time   Airway Mallampati: III  TM Distance: >3 FB Neck ROM: Full    Dental  (+) Teeth Intact   Pulmonary former smoker,  Seasonal allergies   breath sounds clear to auscultation       Cardiovascular Exercise Tolerance: Good hypertension, Pt. on home beta blockers  Rhythm:Regular     Neuro/Psych    GI/Hepatic GERD  Controlled,(+)     substance abuse  alcohol use, Pancreatic Cancer with biliary obstruction   Endo/Other    Renal/GU Renal InsufficiencyRenal disease     Musculoskeletal   Abdominal (+)  Abdomen: tender.    Peds  Hematology   Anesthesia Other Findings Drinks 1-2 pints of bourbon weekly  Reproductive/Obstetrics                             Anesthesia Physical Anesthesia Plan  ASA: III  Anesthesia Plan: General   Post-op Pain Management:    Induction: Intravenous  Airway Management Planned: Oral ETT  Additional Equipment:   Intra-op Plan:   Post-operative Plan: Extubation in OR  Informed Consent: I have reviewed the patients History and Physical, chart, labs and discussed the procedure including the risks, benefits and alternatives for the proposed anesthesia with the patient or authorized representative who has indicated his/her understanding and acceptance.     Plan Discussed with:   Anesthesia Plan Comments:         Anesthesia Quick Evaluation

## 2015-01-04 NOTE — Progress Notes (Signed)
Leann Schonewitz RNconfirmed that patient's 12:00 noon Zosyn dose was hung prior to transport to peri op holding room # 4.  She was unable to enter in [facility] due to patient's out of depatment.

## 2015-01-04 NOTE — Progress Notes (Signed)
[facility] PROGRESS NOTE  William Rivera [id number] DOB: 06-04-1935 DOA: 01/02/2015  PCP: [name], MD  Brief HPI: 79 year old Caucasian male who was diagnosed with pancreatic cancer in August and had a biliary stent placed to relieve obstructive jaundice. He is followed at [facility] and has completed chemotherapy. Plan is for radiation treatment and then eventually surgery. Presented with pruritus and was found to have elevated bilirubin suggestive of obstruction once again. He was hospitalized for further management.  Past medical history:  Past Medical History  Diagnosis Date  . Hypertension   . Gout   . Barrett's esophagus     last EGD/Bx 12/11  . Helicobacter pylori gastritis 2000    s/p treatment  . Adenomatous colon polyp 2010  . GERD (gastroesophageal reflux disease)   . GASTRITIS 09/23/2008    Qualifier: Diagnosis of  By: Nicole Kindred LPN, Doris    . Cancer of pancreas, other site AUG 2016    CA 19-9 958.6    Consultants: Gastroenterology  Procedures: ERCP is scheduled for today  Antibiotics: Zosyn  Subjective: Patient feels well. She still has itching. Denies any abdominal discomfort, nausea or vomiting currently. His wife is at the bedside. Waiting for his procedure.  Objective: Vital Signs  Filed Vitals:   01/03/15 1340 01/03/15 2254 01/04/15 0620 01/04/15 0820  BP: 97/54 96/57 114/55   Pulse: 80 71 74   Temp: 97.5 F (36.4 C) 98.1 F (36.7 C) 97.6 F (36.4 C)   TempSrc: Oral Oral Oral   Resp: 20 18 18    Height:      Weight:      SpO2: 94% 93% 93% 95%    Intake/Output Summary (Last 24 hours) at 01/04/15 1057 Last data filed at 01/04/15 0914  Gross per 24 hour  Intake 4436.25 ml  Output      0 ml  Net 4436.25 ml   Filed Weights   01/02/15 1636  Weight: 88.1 kg (194 lb 3.6 oz)    General appearance: alert, cooperative, appears stated age and no distress. Patient is icteric Resp: clear to auscultation  bilaterally Cardio: regular rate and rhythm, S1, S2 normal, no murmur, click, rub or gallop GI: Soft. Fullness is appreciated in the epigastric area without any discrete masses. No organomegaly. Bowel sounds are present. Extremities: extremities normal, atraumatic, no cyanosis or edema Neurologic: Grossly normal   Lab Results:  Basic Metabolic Panel:  Recent Labs Lab 01/02/15 1241 01/03/15 0600 01/04/15 0858  NA 136 138 140  K 3.5 3.2* 4.0  CL 103 109 112*  CO2 24 23 23   GLUCOSE 175* 115* 111*  BUN 29* 30* 27*  CREATININE 1.99* 1.88* 1.99*  CALCIUM 9.4 8.8* 9.0   Liver Function Tests:  Recent Labs Lab 01/02/15 1241 01/03/15 0600 01/04/15 0858  AST 126* 82* 70*  ALT 122* 89* 83*  ALKPHOS 338* 248* 265*  BILITOT 10.2* 5.1* 4.4*  PROT 7.9 6.2* 6.9  ALBUMIN 2.8* 2.3* 2.5*   CBC:  Recent Labs Lab 01/02/15 1241 01/03/15 0600 01/04/15 0858  WBC 18.4* 10.7* 11.7*  HGB 11.2* 9.3* 10.4*  HCT 32.8* 28.0* 30.9*  MCV 96.5 96.6 96.9  PLT 337 255 327     Studies/Results: Ct Abdomen Pelvis Wo Contrast  01/02/2015  CLINICAL DATA:  79 year old male with history of adenocarcinoma of the pancreatic head status post stent placement in the common bile duct. Jaundice. EXAM: CT ABDOMEN AND PELVIS WITHOUT CONTRAST TECHNIQUE: Multidetector CT imaging of the abdomen and  pelvis was performed following the standard protocol without IV contrast. COMPARISON:  MRI of the abdomen 09/06/2014. FINDINGS: Lower chest: Small nodules in the lung bases bilaterally, largest of which is in the right lower lobe where there is a pleural-based 15 x 8 mm nodule (image 14 of series 3). Atherosclerotic calcifications in the left main, left anterior descending and right coronary arteries. Hepatobiliary: 9 mm low attenuation lesion in segment 4A, previously characterized as a small simple cyst. No other definite suspicious hepatic lesions are identified on today's noncontrast CT examination. There is a common  bile duct stent in place, the tip of which extends into the second portion of the duodenum. There appears to be some residual intrahepatic biliary ductal dilatation (difficult to judge on today's noncontrast CT). Gallbladder is moderately distended. Pancreas: Fullness in the pancreatic head, without clearly discernible mass. Body and tail of the pancreas are unremarkable in appearance on today's noncontrast CT examination. Spleen: Small splenule inferior to the splenic hilum. Otherwise, unremarkable. Adrenals/Urinary Tract: Left renal atrophy. Multifocal cortical thinning in the kidneys bilaterally (left greater than right), presumably reflective of chronic post infectious or inflammatory scarring. No discrete renal lesions are confidently identified on today's noncontrast CT examination. No hydroureteronephrosis. Urinary bladder is unremarkable in appearance. Stomach/Bowel: Unenhanced appearance of the stomach is normal. No pathologic dilatation of small bowel or colon. Normal appendix. Vascular/Lymphatic: Atherosclerosis throughout the abdominal and pelvic vasculature, without evidence of aneurysm or dissection. No lymphadenopathy noted in the abdomen or pelvis on today's noncontrast CT examination. Reproductive: Prostate gland and seminal vesicles are unremarkable in appearance. Other: No significant volume of ascites.  No pneumoperitoneum. Musculoskeletal: There are no aggressive appearing lytic or blastic lesions noted in the visualized portions of the skeleton. IMPRESSION: 1. Common bile duct stent in position and appears properly located. There appears likely to be some residual intra and extrahepatic biliary ductal dilatation, which is difficult to judge on today's noncontrast CT examination. 2. Mild fullness in the pancreatic head corresponding to reported pancreatic mass. This is incompletely evaluated on today's noncontrast CT examination. 3. Atherosclerosis, including left main and 2 vessel cord nearly  artery disease. 4. Additional incidental findings, as above. Electronically Signed   By: Vinnie Langton M.D.   On: 01/02/2015 14:45    Medications:  Scheduled: . allopurinol  100 mg Oral Daily  . atenolol  25 mg Oral Daily  . famotidine  20 mg Oral Daily  . pantoprazole  40 mg Oral Daily  . piperacillin-tazobactam (ZOSYN)  IV  3.375 g Intravenous Q8H  . thiamine  100 mg Oral Daily   Continuous: . sodium chloride 75 mL/hr at 01/04/15 0859   [id number], HYDROcodone-acetaminophen, hydrOXYzine, ondansetron **OR** ondansetron (ZOFRAN) IV  Assessment/Plan:  Active Problems:   Essential hypertension   Adenocarcinoma of pancreas (HCC)   Obstructive jaundice    Obstructive jaundice Likely due to his pancreatic malignancy. Patient does have a biliary stent, which could have obstructed. Gastroenterology is following and the plan is for ERCP this afternoon with the possible stent replacement. Bilirubin and alkaline phosphatase are improved. Continue to monitor for now. Continue Zosyn per GI.  Pancreatic cancer Patient is followed at [facility]. He has completed chemotherapy. Eventually, he will undergo surgery. Patient will follow-up at [facility].  History of essential hypertension Blood pressure was borderline low normal. Hold his atenolol for now.   Possible ARF on likely chronic kidney disease His old labs were reviewed. Patient does have a history of renal insufficiency and decreased GFR.  He likely has some degree of chronic kidney disease, which is unable to be determined at this time. Creatinine is significantly higher than his usual baseline, which appears to be around 1.4 1.5. He has received IV fluids without much significant improvement. CT scan did show that he had an atrophic left kidney. Chronic changes were also noted in the right kidney. No hydronephrosis was noted. Continue to monitor urine output for now. Check UA.  Normocytic anemia Likely due to chronic  disease. Continue to monitor.  DVT Prophylaxis: SCDs    Code Status: Full code  Family Communication: Discussed with the patient and his wife  Disposition Plan: Await ERCP.    LOS: 2 days   [facility] Pager [phone] 01/04/2015, 10:57 AM  If 7PM-7AM, please contact night-coverage at [email or web address], password [facility]

## 2015-01-04 NOTE — Clinical Documentation Improvement (Signed)
Internal Medicine  Abnormal Lab/Test Results:   BUN 6 - 20 mg/dL 30 (H) 29 (H) 30 (H)     Creatinine, Ser 0.61 - 1.24 mg/dL 1.88 (H) 1.99 (H)           Possible Clinical Conditions associated with below indicators    Acute Renal Failure  Other Condition  Cannot Clinically Determine   Supporting Information:  Obstructive Jaundice  Treatment Provided:  NS @ 114ml/hr    Please exercise your independent, professional judgment when responding. A specific answer is not anticipated or expected.   Thank You,  [place] [phone]

## 2015-01-04 NOTE — Progress Notes (Signed)
Patient seen and examined in short stay. In all likelihood, previously placed biliary stent has occluded. It remains in position on CT. Patient has apparently finished chemotherapy and will now be receiving radiation therapy in preparation for attempt at resection in the near future. Given this information, I feel be best to simply plan to replace the indwelling plastic stent with another one. Ultimately, if patient is not resected, he will need to return for a permanent expandable metallic stent. The risks, benefits, limitations and alternatives of today's approach reviewed with patient and spouse. Questions answered. All parties agreeable.

## 2015-01-04 NOTE — Anesthesia Postprocedure Evaluation (Deleted)
Anesthesia Post Note  Patient: William Rivera  Procedure(s) Performed: Procedure(s) (LRB): ENDOSCOPIC RETROGRADE CHOLANGIOPANCREATOGRAPHY (ERCP) (N/A) GASTROINTESTINAL STENT REMOVAL (N/A) BILIARY STENT PLACEMENT (N/A)  Anesthesia type: General  Patient location: PACU  Post pain: Pain level controlled  Post assessment: Post-op Vital signs reviewed, Patient's Cardiovascular Status Stable, Respiratory Function Stable, Patent Airway, No signs of Nausea or vomiting and Pain level controlled    Post vital signs: Reviewed and stable  Level of consciousness: awake and alert   Complications: No apparent anesthesia complications

## 2015-01-04 NOTE — Progress Notes (Signed)
Unable to do ERCP today. Encountered duodenal stricture preventing passage of the duodenoscope.  I was able to pass the gastroscope across the narrowing and visualized the stent distally. Ultimately, however, I was unable to pass the duodenoscope. It may well be worth attempting again with a pediatric duodenoscope at a tertiary referral center. I am surprised the patient is not having more in the way of GOO symptoms   I have called Shenn at [facility]. He has kindly agreed to help. He has accepted pt in transfer, however, no beds currently. Bed situation reportedly will look better for transfer tomorrow. Discussed with Dr. Maryland Pink.   I recommend continuing IV Zosyn and allowing clear liquid diet.   Discussed findings and recommendations at length with patient's spouse who is in agreement.

## 2015-01-04 NOTE — Transfer of Care (Signed)
Immediate Anesthesia Transfer of Care Note  Patient: William Rivera  Procedure(s) Performed: Procedure(s): ESOPHAGOGASTRODUODENOSCOPY (EGD) WITH PROPOFOL (N/A)  Patient Location: PACU  Anesthesia Type:General  Level of Consciousness: sedated  Airway & Oxygen Therapy: Patient Spontanous Breathing and Patient connected to face mask oxygen  Post-op Assessment: Report given to RN and Post -op Vital signs reviewed and stable  Post vital signs: Reviewed and stable  Last Vitals:  Filed Vitals:   01/04/15 1405 01/04/15 1410  BP: 123/74 121/72  Pulse:    Temp:    Resp: 12 14    Complications: No apparent anesthesia complications

## 2015-01-04 NOTE — Anesthesia Procedure Notes (Signed)
Procedure Name: Intubation Date/Time: 01/04/2015 2:26 PM Performed by: Charmaine Downs Pre-anesthesia Checklist: Emergency Drugs available, Patient identified, Suction available and Patient being monitored Patient Re-evaluated:Patient Re-evaluated prior to inductionOxygen Delivery Method: Circle system utilized Preoxygenation: Pre-oxygenation with 100% oxygen Intubation Type: IV induction, Rapid sequence and Cricoid Pressure applied Ventilation: Mask ventilation without difficulty Laryngoscope Size: Mac and 3 Grade View: Grade III Tube size: 8.0 mm Number of attempts: 1 Airway Equipment and Method: Stylet Placement Confirmation: positive ETCO2 and breath sounds checked- equal and bilateral Secured at: 22 cm Tube secured with: Tape Dental Injury: Teeth and Oropharynx as per pre-operative assessment  Difficulty Due To: Difficulty was anticipated and Difficult Airway- due to anterior larynx

## 2015-01-05 DIAGNOSIS — I10 Essential (primary) hypertension: Secondary | ICD-10-CM

## 2015-01-05 LAB — COMPREHENSIVE METABOLIC PANEL
ALBUMIN: 2.5 g/dL — AB (ref 3.5–5.0)
ALT: 73 U/L — AB (ref 17–63)
AST: 67 U/L — AB (ref 15–41)
Alkaline Phosphatase: 232 U/L — ABNORMAL HIGH (ref 38–126)
Anion gap: 6 (ref 5–15)
BILIRUBIN TOTAL: 4.9 mg/dL — AB (ref 0.3–1.2)
BUN: 19 mg/dL (ref 6–20)
CHLORIDE: 111 mmol/L (ref 101–111)
CO2: 22 mmol/L (ref 22–32)
CREATININE: 1.65 mg/dL — AB (ref 0.61–1.24)
Calcium: 8.7 mg/dL — ABNORMAL LOW (ref 8.9–10.3)
GFR, EST AFRICAN AMERICAN: 45 mL/min — AB (ref >60)
GFR, EST NON AFRICAN AMERICAN: 38 mL/min — AB (ref >60)
Glucose, Bld: 136 mg/dL — ABNORMAL HIGH (ref 65–99)
POTASSIUM: 3.7 mmol/L (ref 3.5–5.1)
SODIUM: 139 mmol/L (ref 135–145)
TOTAL PROTEIN: 6.6 g/dL (ref 6.5–8.1)

## 2015-01-05 LAB — CBC
HCT: 30.1 % — ABNORMAL LOW (ref 39.0–52.0)
HEMOGLOBIN: 9.7 g/dL — AB (ref 13.0–17.0)
MCH: 31.7 pg (ref 26.0–34.0)
MCHC: 32.2 g/dL (ref 30.0–36.0)
MCV: 98.4 fL (ref 78.0–100.0)
Platelets: 311 10*3/uL (ref 150–400)
RBC: 3.06 MIL/uL — AB (ref 4.22–5.81)
RDW: 16.9 % — ABNORMAL HIGH (ref 11.5–15.5)
WBC: 10.5 10*3/uL (ref 4.0–10.5)

## 2015-01-05 NOTE — Progress Notes (Signed)
Subjective:  Denies abdominal pain, N/V. Complains of itching.  Objective: Vital signs in last 24 hours: Temp:  [97.5 F (36.4 C)-98.3 F (36.8 C)] 97.5 F (36.4 C) (12/15 0509) Pulse Rate:  [78-81] 81 (12/15 0509) Resp:  [12-29] 20 (12/15 0509) BP: (110-150)/(62-74) 110/64 mmHg (12/15 0509) SpO2:  [94 %-100 %] 99 % (12/15 0509) Last BM Date: 01/03/15 General:   Alert,  pleasant and cooperative in NAD. Jaundice. Head:  Normocephalic and atraumatic. Eyes:  Sclera clear, + icterus.  Chest: CTA bilaterally without rales, rhonchi, crackles.    Heart:  Regular rate and rhythm; no murmurs, clicks, rubs,  or gallops. Abdomen:  Soft, nontender and nondistended. Normal bowel sounds, without guarding, and without rebound.   Extremities:  Without clubbing, deformity. Trace bilateral pedal edema. Neurologic:  Alert and  oriented x4;  grossly normal neurologically. Skin:  Intact without significant lesions or rashes. Psych:  Alert and cooperative. Normal mood and affect.  Intake/Output from previous day: 12/14 0701 - 12/15 0700 In: 900 [I.V.:900] Out: -  Intake/Output this shift:    Lab Results: CBC  Recent Labs  01/03/15 0600 01/04/15 0858 01/05/15 0513  WBC 10.7* 11.7* 10.5  HGB 9.3* 10.4* 9.7*  HCT 28.0* 30.9* 30.1*  MCV 96.6 96.9 98.4  PLT 255 327 311   BMET  Recent Labs  01/03/15 0600 01/04/15 0858 01/05/15 0513  NA 138 140 139  K 3.2* 4.0 3.7  CL 109 112* 111  CO2 23 23 22   GLUCOSE 115* 111* 136*  BUN 30* 27* 19  CREATININE 1.88* 1.99* 1.65*  CALCIUM 8.8* 9.0 8.7*   LFTs  Recent Labs  01/02/15 1241 01/03/15 0600 01/04/15 0858 01/05/15 0513  BILITOT 10.2* 5.1* 4.4* 4.9*  BILIDIR 6.8* 3.1*  --   --   IBILI 3.4* 2.0*  --   --   ALKPHOS 338* 248* 265* 232*  AST 126* 82* 70* 67*  ALT 122* 89* 83* 73*  PROT 7.9 6.2* 6.9 6.6  ALBUMIN 2.8* 2.3* 2.5* 2.5*   No results for input(s): LIPASE in the last 72 hours. PT/INR No results for input(s): LABPROT,  INR in the last 72 hours.    Imaging Studies: Ct Abdomen Pelvis Wo Contrast  01/02/2015  CLINICAL DATA:  79 year old male with history of adenocarcinoma of the pancreatic head status post stent placement in the common bile duct. Jaundice. EXAM: CT ABDOMEN AND PELVIS WITHOUT CONTRAST TECHNIQUE: Multidetector CT imaging of the abdomen and pelvis was performed following the standard protocol without IV contrast. COMPARISON:  MRI of the abdomen 09/06/2014. FINDINGS: Lower chest: Small nodules in the lung bases bilaterally, largest of which is in the right lower lobe where there is a pleural-based 15 x 8 mm nodule (image 14 of series 3). Atherosclerotic calcifications in the left main, left anterior descending and right coronary arteries. Hepatobiliary: 9 mm low attenuation lesion in segment 4A, previously characterized as a small simple cyst. No other definite suspicious hepatic lesions are identified on today's noncontrast CT examination. There is a common bile duct stent in place, the tip of which extends into the second portion of the duodenum. There appears to be some residual intrahepatic biliary ductal dilatation (difficult to judge on today's noncontrast CT). Gallbladder is moderately distended. Pancreas: Fullness in the pancreatic head, without clearly discernible mass. Body and tail of the pancreas are unremarkable in appearance on today's noncontrast CT examination. Spleen: Small splenule inferior to the splenic hilum. Otherwise, unremarkable. Adrenals/Urinary Tract: Left renal atrophy. Multifocal cortical  thinning in the kidneys bilaterally (left greater than right), presumably reflective of chronic post infectious or inflammatory scarring. No discrete renal lesions are confidently identified on today's noncontrast CT examination. No hydroureteronephrosis. Urinary bladder is unremarkable in appearance. Stomach/Bowel: Unenhanced appearance of the stomach is normal. No pathologic dilatation of small bowel  or colon. Normal appendix. Vascular/Lymphatic: Atherosclerosis throughout the abdominal and pelvic vasculature, without evidence of aneurysm or dissection. No lymphadenopathy noted in the abdomen or pelvis on today's noncontrast CT examination. Reproductive: Prostate gland and seminal vesicles are unremarkable in appearance. Other: No significant volume of ascites.  No pneumoperitoneum. Musculoskeletal: There are no aggressive appearing lytic or blastic lesions noted in the visualized portions of the skeleton. IMPRESSION: 1. Common bile duct stent in position and appears properly located. There appears likely to be some residual intra and extrahepatic biliary ductal dilatation, which is difficult to judge on today's noncontrast CT examination. 2. Mild fullness in the pancreatic head corresponding to reported pancreatic mass. This is incompletely evaluated on today's noncontrast CT examination. 3. Atherosclerosis, including left main and 2 vessel cord nearly artery disease. 4. Additional incidental findings, as above. Electronically Signed   By: Vinnie Langton M.D.   On: 01/02/2015 14:45  [2 weeks]   Assessment:  79 year old male with history of newly diagnosed pancreatic mass in August 2016, prior ERCP Aug 2016 for obstructive jaundice with plastic stent placed, seen as an outpatient with notable bump in LFTs and obstructive jaundice. At time of visit, outside labs reviewed with bilirubin noted at 4, and it had increased to the 10 range. Leukocytosis also noted, raising concern for evolving cholangitis. Creatinine was also worsened from baseline. Admitted from clinic for supportive measures and likely ERCP with stent exchange. Upon of review of notes from [facility], he was started on chemo 10/20/14 (Gem/Abraxane) and referred to [facility] for chemoradiation for cytoreduction in preparation for attempt at resection.   Attempted ERCP for stent change yesterday unsuccessful. The duodenoscopy could not be passed  beyond high grade area of obstruction at junction of 1st and 2nd portion of duodenum. Plans for transfer to [facility] once bed available for ERCP for stent change with pediatric duodenoscope. Dr. Jyl Heinz has accepted patient in transfer.   Clinically, he is without abdominal pain but notes persistent itching as expected. LFTs have improved and creatinine is slowly improving with fluid resuscitation. Will keep him on Zosyn empirically for now.      Plan: 1. Continue IV Zosyn 2. Await transfer.   Laureen Ochs. Bernarda Caffey [facility] [phone] 12/15/20168:56 AM     LOS: 3 days

## 2015-01-05 NOTE — Progress Notes (Signed)
[facility] PROGRESS NOTE  William Rivera [id number] DOB: [birth year] DOA: 01/02/2015 PCP: [name], MD  Assessment/Plan: 1. Obstructive jaundice, likely due to his pancreatic malignancy. Patient was recently diagnosed with pancreatic cancer and was found to have obstructive jaundice, requiring a biliary stent. When he followed up with GI, it was felt that he may have had an obstruction in the stent due to elevated bilirubin and leukocytosis. With elevated white count, there were also concerns for developing cholangitis. He was admitted to the hospital for IV abx and ERCP.   ERCP was performed on 12/14 but unfortunately duodenoscope could not be advanced past the first part of the duodenum, likely related to his pancreatic cancer. It was felt that possibly a pediatric scope could be advanced into the second part of the duodenum. GI has discussed the case with Dr. Sallee Lange at [facility] who has accepted the patient in transfer. Will continue Zosyn for now. Awaiting available bed. He is currently on clear liquids due to concerns of developing GOO symptoms.  2. Pancreatitic Cancer, followed at [facility]. 3. Essential HTN, BP soft. Will continue to hold antihypertensives. 4. AKI with possible history of CKD, baseline creatinine 1-1.5. Creatinine improving with IVF. 5. Anemia of chronic disease, stable. Continue to monitor.     Code Status: Full code  DVT Prophylaxis: SCDs  Family Communication: Family at bedside. Discussed with patient who understands and has no concerns at this time. Disposition Plan: Transfer to [facility] once bed is available.    Consultants:   Gastroenterology  Procedures:   none  Antibiotics:  Zosyn  HPI/Subjective: Feels itchy. Denies any abdominal pain, nausea, or vomiting.    Objective: Filed Vitals:   01/04/15 2151 01/05/15 0509  BP: 117/63 110/64  Pulse: 81 81  Temp: 98.3 F (36.8 C) 97.5 F (36.4 C)  Resp: 16 20     Intake/Output Summary (Last 24 hours) at 01/05/15 0804 Last data filed at 01/04/15 1545  Gross per 24 hour  Intake    900 ml  Output      0 ml  Net    900 ml   Filed Weights   01/02/15 1636  Weight: 88.1 kg (194 lb 3.6 oz)    Exam:  General: NAD, looks comfortable Cardiovascular: RRR, S1, S2  Respiratory: clear bilaterally, No wheezing, rales or rhonchi Abdomen: soft, non tender, no distention , bowel sounds normal Musculoskeletal: No edema b/l   Data Reviewed: Basic Metabolic Panel:  Recent Labs Lab 01/02/15 1241 01/03/15 0600 01/04/15 0858 01/05/15 0513  NA 136 138 140 139  K 3.5 3.2* 4.0 3.7  CL 103 109 112* 111  CO2 24 23 23 22   GLUCOSE 175* 115* 111* 136*  BUN 29* 30* 27* 19  CREATININE 1.99* 1.88* 1.99* 1.65*  CALCIUM 9.4 8.8* 9.0 8.7*   Liver Function Tests:  Recent Labs Lab 01/02/15 1241 01/03/15 0600 01/04/15 0858 01/05/15 0513  AST 126* 82* 70* 67*  ALT 122* 89* 83* 73*  ALKPHOS 338* 248* 265* 232*  BILITOT 10.2* 5.1* 4.4* 4.9*  PROT 7.9 6.2* 6.9 6.6  ALBUMIN 2.8* 2.3* 2.5* 2.5*    CBC:  Recent Labs Lab 01/02/15 1241 01/03/15 0600 01/04/15 0858 01/05/15 0513  WBC 18.4* 10.7* 11.7* 10.5  HGB 11.2* 9.3* 10.4* 9.7*  HCT 32.8* 28.0* 30.9* 30.1*  MCV 96.5 96.6 96.9 98.4  PLT 337 255 327 311     Scheduled Meds: . allopurinol  100 mg Oral Daily  . famotidine  20  mg Oral Daily  . pantoprazole  40 mg Oral Daily  . piperacillin-tazobactam (ZOSYN)  IV  3.375 g Intravenous Q8H  . thiamine  100 mg Oral Daily   Continuous Infusions: . sodium chloride 75 mL/hr at 01/05/15 [id number]    Active Problems:   Essential hypertension   Adenocarcinoma of pancreas (HCC)   Obstructive jaundice   Duodenal stricture   Time spent: 20 minutes   Jehanzeb Memon. MD  [facility] Pager [phone]. If 7PM-7AM, please contact night-coverage at [email or web address], password [facility] 01/05/2015, 8:04 AM  LOS: 3 days     By signing my name below, I,  Rosalie Doctor, attest that this documentation has been prepared under the direction and in the presence of [facility]. MD Electronically Signed: Rosalie Doctor, Scribe. 01/05/2015 1:48pm   I, Dr. Kathie Dike, personally performed the services described in this documentaiton. All medical record entries made by the scribe were at my direction and in my presence. I have reviewed the chart and agree that the record reflects my personal performance and is accurate and complete  Kathie Dike, MD, 01/05/2015 2:03 PM

## 2015-01-05 NOTE — Op Note (Signed)
NAMEJOEY, KRAUSKOPF                  ACCOUNT NO.:  [id number]  MEDICAL RECORD NO.:  [id number]  LOCATION:  [id number]                          FACILITY:  [facility]  PHYSICIAN:  R. Garfield Cornea, MD FACP FACGDATE OF BIRTH:  08-May-1935  DATE OF PROCEDURE:  01/04/2015 DATE OF DISCHARGE:                              OPERATIVE REPORT   INDICATIONS FOR PROCEDURES:  A 79 year old gentleman with a pancreatic head tumor, presented a few months ago with obstructing jaundice. Plastic biliary stent was placed by Dr. Laural Golden.  He received chemotherapy and now receiving radiation therapy.  It is still hoped he is a surgical candidate.  He recently presented with severe pruritus and jaundice.  LFTs including aminotransferase and bilirubin elevated. Stent remains in good position on CT scan, but its 28-1/2 month old and likely occluded.  Procedure now being done to switch it out the indwelling stent with a new temporary plastic stent.  This approach has been discussed with the patient and the patient's wife at length along with risks, benefits, limitations, alternatives, and imponderables have been discussed.  DESCRIPTION OF PROCEDURE:  General endotracheal anesthesia was provided by Dr. Duwayne Heck and associates.  IV Zosyn on board.  INSTRUMENT:  Pentax video chip gastroscope and Adult duodenoscope.  FINDINGS:  Cursory examination of the esophagus, stomach through the pyloric channel, revealed no abnormalities.  However, the duodenum was markedly abnormal beginning at the junction of the bulb and second portion.  Partially obstructed duodenal lumen stricturing and displacement of the lumen laterally.  I was unable to find or negotiate the lumen with the duodenoscope.  I backed out and obtained the diagnostic gastroscope and found the lumen, significantly compromised, laterally displaced.  After some difficulty, I was able to negotiate scope down into the 2nd portion of the duodenum.  I Observed the indwelling  stent protruding from the ampullary orifice.  I pulled back and obtained the duodenoscope and passed it into the pyloric channel. In spite of making a number of approaches including changing the patient's position on the OR table, I was unable to traverse the stricture.  Consequently, further attempts were felt to be futile and the procedure was terminated.  The patient tolerated the procedure well and was taken to PACU in stable condition.  IMPRESSION: Partial duodenal obstruction at the junction of the 1st and 2nd portion of the duodenum, likely representative of carcinoma of the pancreas.   Unable to intubate the 2nd portion of the duodenum to perform ERCP.  I'm somewhat surprised patient does not have more GOO symptoms  It may well be that the duodenal stricture could be traversed with a pediatric duodenoscope at a tertiary referral center.  I will make contact with Dr. Jyl Heinz, one of the physicians he was seen at [facility] to see if we can get him over there to achieve biliary decompression.  I have discussed my findings and recommendations at length with the patient's wife and discussed them as well with Dr. Bonnielee Haff.     Bridgette Habermann, MD [facility]     RMR/MEDQ  D:  01/04/2015  T:  01/04/2015  Job:  [id number]

## 2015-01-06 ENCOUNTER — Encounter (HOSPITAL_COMMUNITY): Payer: Self-pay | Admitting: Internal Medicine

## 2015-01-06 DIAGNOSIS — K869 Disease of pancreas, unspecified: Secondary | ICD-10-CM | POA: Diagnosis not present

## 2015-01-06 DIAGNOSIS — K831 Obstruction of bile duct: Secondary | ICD-10-CM | POA: Diagnosis not present

## 2015-01-06 DIAGNOSIS — K315 Obstruction of duodenum: Secondary | ICD-10-CM | POA: Diagnosis not present

## 2015-01-06 DIAGNOSIS — K838 Other specified diseases of biliary tract: Secondary | ICD-10-CM | POA: Diagnosis not present

## 2015-01-06 DIAGNOSIS — K269 Duodenal ulcer, unspecified as acute or chronic, without hemorrhage or perforation: Secondary | ICD-10-CM | POA: Diagnosis not present

## 2015-01-06 NOTE — Discharge Summary (Signed)
Physician Discharge Summary  William Rivera [id number] DOB: 12-21-1935 DOA: 01/02/2015  PCP: [name], MD  Admit date: 01/02/2015 Discharge date: 01/05/2015  Time spent: 15 minutes  Recommendations for Outpatient Follow-up:  1. Patient was discharged to [facility] for further care   Discharge Diagnoses:  Active Problems:   Essential hypertension   Adenocarcinoma of pancreas (HCC)   Obstructive jaundice   Duodenal stricture   Acute Kidney Injury   Anemia of chronic disease  Discharge Condition: stable  Diet recommendation: soft diet  Filed Weights   01/02/15 1636  Weight: 88.1 kg (194 lb 3.6 oz)    History of present illness:  79 year old male with a history of pancreatic cancer status post biliary stent placement for obstructive jaundice became increasingly weak and had pruritus. Lab work showed that he had worsening jaundice and elevated WBC count. He was admitted to the hospital for ERCP .  Hospital Course:  1. Obstructive jaundice, likely due to his pancreatic malignancy. Patient was recently diagnosed with pancreatic cancer and was found to have obstructive jaundice, requiring a biliary stent. When he followed up with GI, it was felt that he may have had an obstruction in the stent due to elevated bilirubin and leukocytosis. With elevated white count, there were also concerns for developing cholangitis. He was admitted to the hospital for IV abx and ERCP. ERCP was performed on 12/14 but unfortunately duodenoscope could not be advanced past the first part of the duodenum, likely related to his pancreatic cancer. It was felt that possibly a pediatric scope could be advanced into the second part of the duodenum. GI has discussed the case with Dr. Sallee Lange at [facility] who has accepted the patient in transfer. Will continue Zosyn for now. He is currently on clear liquids due to concerns of developing GOO symptoms.  2. Pancreatitic Cancer,  followed at [facility]. 3. Essential HTN, BP soft. Will continue to hold antihypertensives. 4. AKI with possible history of CKD, baseline creatinine 1-1.5. Creatinine improving with IVF. 5. Anemia of chronic disease, stable. Continue to monitor  Procedures:    Consultations:  Gastroenterology  Discharge Exam: Filed Vitals:   01/05/15 1316 01/05/15 1718  BP: 117/60 140/80  Pulse: 85 87  Temp: 97.8 F (36.6 C) 98 F (36.7 C)  Resp: 20 18    General: NAD Cardiovascular: S1, S2 RRR Respiratory: CTA B  Discharge Instructions    Discharge Medication List as of 01/05/2015  8:23 PM    CONTINUE these medications which have NOT CHANGED   Details  allopurinol (ZYLOPRIM) 100 MG tablet Take 100 mg by mouth daily.  , Until Discontinued, Historical Med    chlordiazePOXIDE (LIBRIUM) 25 MG capsule Take 25 mg by mouth 3 (three) times daily as needed for anxiety., Until Discontinued, Historical Med    HYDROcodone-acetaminophen (NORCO/VICODIN) 5-325 MG per tablet TK 1 T PO  Q 6 H PRF MODERATE OR SEVERE PAIN, Historical Med    ondansetron (ZOFRAN) 8 MG tablet Take 8 mg by mouth every 8 (eight) hours as needed. , Starting 10/06/2014, Until Discontinued, Historical Med    ranitidine (ZANTAC 150 MAXIMUM STRENGTH) 150 MG tablet Take 150 mg by mouth as needed for heartburn., Until Discontinued, Historical Med      STOP taking these medications     omeprazole (PRILOSEC) 20 MG capsule      thiamine 100 MG tablet        Allergies  Allergen Reactions  . Doxycycline  rash  . Colestipol Itching      The results of significant diagnostics from this hospitalization (including imaging, microbiology, ancillary and laboratory) are listed below for reference.    Significant Diagnostic Studies: Ct Abdomen Pelvis Wo Contrast  01/02/2015  CLINICAL DATA:  79 year old male with history of adenocarcinoma of the pancreatic head status post stent placement in the common bile duct. Jaundice.  EXAM: CT ABDOMEN AND PELVIS WITHOUT CONTRAST TECHNIQUE: Multidetector CT imaging of the abdomen and pelvis was performed following the standard protocol without IV contrast. COMPARISON:  MRI of the abdomen 09/06/2014. FINDINGS: Lower chest: Small nodules in the lung bases bilaterally, largest of which is in the right lower lobe where there is a pleural-based 15 x 8 mm nodule (image 14 of series 3). Atherosclerotic calcifications in the left main, left anterior descending and right coronary arteries. Hepatobiliary: 9 mm low attenuation lesion in segment 4A, previously characterized as a small simple cyst. No other definite suspicious hepatic lesions are identified on today's noncontrast CT examination. There is a common bile duct stent in place, the tip of which extends into the second portion of the duodenum. There appears to be some residual intrahepatic biliary ductal dilatation (difficult to judge on today's noncontrast CT). Gallbladder is moderately distended. Pancreas: Fullness in the pancreatic head, without clearly discernible mass. Body and tail of the pancreas are unremarkable in appearance on today's noncontrast CT examination. Spleen: Small splenule inferior to the splenic hilum. Otherwise, unremarkable. Adrenals/Urinary Tract: Left renal atrophy. Multifocal cortical thinning in the kidneys bilaterally (left greater than right), presumably reflective of chronic post infectious or inflammatory scarring. No discrete renal lesions are confidently identified on today's noncontrast CT examination. No hydroureteronephrosis. Urinary bladder is unremarkable in appearance. Stomach/Bowel: Unenhanced appearance of the stomach is normal. No pathologic dilatation of small bowel or colon. Normal appendix. Vascular/Lymphatic: Atherosclerosis throughout the abdominal and pelvic vasculature, without evidence of aneurysm or dissection. No lymphadenopathy noted in the abdomen or pelvis on today's noncontrast CT examination.  Reproductive: Prostate gland and seminal vesicles are unremarkable in appearance. Other: No significant volume of ascites.  No pneumoperitoneum. Musculoskeletal: There are no aggressive appearing lytic or blastic lesions noted in the visualized portions of the skeleton. IMPRESSION: 1. Common bile duct stent in position and appears properly located. There appears likely to be some residual intra and extrahepatic biliary ductal dilatation, which is difficult to judge on today's noncontrast CT examination. 2. Mild fullness in the pancreatic head corresponding to reported pancreatic mass. This is incompletely evaluated on today's noncontrast CT examination. 3. Atherosclerosis, including left main and 2 vessel cord nearly artery disease. 4. Additional incidental findings, as above. Electronically Signed   By: Vinnie Langton M.D.   On: 01/02/2015 14:45    Microbiology: No results found for this or any previous visit (from the past 240 hour(s)).   Labs: Basic Metabolic Panel:  Recent Labs Lab 01/02/15 1241 01/03/15 0600 01/04/15 0858 01/05/15 0513  NA 136 138 140 139  K 3.5 3.2* 4.0 3.7  CL 103 109 112* 111  CO2 24 23 23 22   GLUCOSE 175* 115* 111* 136*  BUN 29* 30* 27* 19  CREATININE 1.99* 1.88* 1.99* 1.65*  CALCIUM 9.4 8.8* 9.0 8.7*   Liver Function Tests:  Recent Labs Lab 01/02/15 1241 01/03/15 0600 01/04/15 0858 01/05/15 0513  AST 126* 82* 70* 67*  ALT 122* 89* 83* 73*  ALKPHOS 338* 248* 265* 232*  BILITOT 10.2* 5.1* 4.4* 4.9*  PROT 7.9 6.2* 6.9 6.6  ALBUMIN 2.8* 2.3* 2.5* 2.5*   No results for input(s): LIPASE, AMYLASE in the last 168 hours. No results for input(s): AMMONIA in the last 168 hours. CBC:  Recent Labs Lab 01/02/15 1241 01/03/15 0600 01/04/15 0858 01/05/15 0513  WBC 18.4* 10.7* 11.7* 10.5  HGB 11.2* 9.3* 10.4* 9.7*  HCT 32.8* 28.0* 30.9* 30.1*  MCV 96.5 96.6 96.9 98.4  PLT 337 255 327 311   Cardiac Enzymes: No results for input(s): CKTOTAL, CKMB,  CKMBINDEX, TROPONINI in the last 168 hours. BNP: BNP (last 3 results) No results for input(s): BNP in the last 8760 hours.  ProBNP (last 3 results) No results for input(s): PROBNP in the last 8760 hours.  CBG: No results for input(s): GLUCAP in the last 168 hours.     Signed:  MEMON,JEHANZEB  [facility] 01/06/2015, 6:55 PM

## 2015-01-10 DIAGNOSIS — Z9689 Presence of other specified functional implants: Secondary | ICD-10-CM | POA: Diagnosis not present

## 2015-01-10 DIAGNOSIS — K269 Duodenal ulcer, unspecified as acute or chronic, without hemorrhage or perforation: Secondary | ICD-10-CM | POA: Diagnosis not present

## 2015-01-10 DIAGNOSIS — Z79891 Long term (current) use of opiate analgesic: Secondary | ICD-10-CM | POA: Diagnosis not present

## 2015-01-10 DIAGNOSIS — Z888 Allergy status to other drugs, medicaments and biological substances status: Secondary | ICD-10-CM | POA: Diagnosis not present

## 2015-01-10 DIAGNOSIS — C250 Malignant neoplasm of head of pancreas: Secondary | ICD-10-CM | POA: Diagnosis not present

## 2015-01-12 ENCOUNTER — Ambulatory Visit: Payer: Medicare Other | Admitting: Gastroenterology

## 2015-01-12 DIAGNOSIS — Z510 Encounter for antineoplastic radiation therapy: Secondary | ICD-10-CM | POA: Diagnosis not present

## 2015-01-12 DIAGNOSIS — C250 Malignant neoplasm of head of pancreas: Secondary | ICD-10-CM | POA: Diagnosis not present

## 2015-01-12 DIAGNOSIS — L299 Pruritus, unspecified: Secondary | ICD-10-CM | POA: Diagnosis not present

## 2015-01-13 DIAGNOSIS — C250 Malignant neoplasm of head of pancreas: Secondary | ICD-10-CM | POA: Diagnosis not present

## 2015-01-13 DIAGNOSIS — L299 Pruritus, unspecified: Secondary | ICD-10-CM | POA: Diagnosis not present

## 2015-01-13 DIAGNOSIS — Z510 Encounter for antineoplastic radiation therapy: Secondary | ICD-10-CM | POA: Diagnosis not present

## 2015-01-18 DIAGNOSIS — Z510 Encounter for antineoplastic radiation therapy: Secondary | ICD-10-CM | POA: Diagnosis not present

## 2015-01-18 DIAGNOSIS — C250 Malignant neoplasm of head of pancreas: Secondary | ICD-10-CM | POA: Diagnosis not present

## 2015-01-18 DIAGNOSIS — L299 Pruritus, unspecified: Secondary | ICD-10-CM | POA: Diagnosis not present

## 2015-01-19 DIAGNOSIS — L299 Pruritus, unspecified: Secondary | ICD-10-CM | POA: Diagnosis not present

## 2015-01-19 DIAGNOSIS — C250 Malignant neoplasm of head of pancreas: Secondary | ICD-10-CM | POA: Diagnosis not present

## 2015-01-19 DIAGNOSIS — Z510 Encounter for antineoplastic radiation therapy: Secondary | ICD-10-CM | POA: Diagnosis not present

## 2015-01-20 DIAGNOSIS — C250 Malignant neoplasm of head of pancreas: Secondary | ICD-10-CM | POA: Diagnosis not present

## 2015-01-20 DIAGNOSIS — Z510 Encounter for antineoplastic radiation therapy: Secondary | ICD-10-CM | POA: Diagnosis not present

## 2015-01-20 DIAGNOSIS — L299 Pruritus, unspecified: Secondary | ICD-10-CM | POA: Diagnosis not present

## 2015-01-24 DIAGNOSIS — Z510 Encounter for antineoplastic radiation therapy: Secondary | ICD-10-CM | POA: Diagnosis not present

## 2015-01-24 DIAGNOSIS — C250 Malignant neoplasm of head of pancreas: Secondary | ICD-10-CM | POA: Diagnosis not present

## 2015-01-25 DIAGNOSIS — C250 Malignant neoplasm of head of pancreas: Secondary | ICD-10-CM | POA: Diagnosis not present

## 2015-01-25 DIAGNOSIS — Z510 Encounter for antineoplastic radiation therapy: Secondary | ICD-10-CM | POA: Diagnosis not present

## 2015-01-26 DIAGNOSIS — Z510 Encounter for antineoplastic radiation therapy: Secondary | ICD-10-CM | POA: Diagnosis not present

## 2015-01-26 DIAGNOSIS — C250 Malignant neoplasm of head of pancreas: Secondary | ICD-10-CM | POA: Diagnosis not present

## 2015-01-27 DIAGNOSIS — Z510 Encounter for antineoplastic radiation therapy: Secondary | ICD-10-CM | POA: Diagnosis not present

## 2015-01-27 DIAGNOSIS — C250 Malignant neoplasm of head of pancreas: Secondary | ICD-10-CM | POA: Diagnosis not present

## 2015-01-30 DIAGNOSIS — C259 Malignant neoplasm of pancreas, unspecified: Secondary | ICD-10-CM | POA: Diagnosis not present

## 2015-01-30 DIAGNOSIS — R0609 Other forms of dyspnea: Secondary | ICD-10-CM | POA: Diagnosis not present

## 2015-01-30 DIAGNOSIS — I10 Essential (primary) hypertension: Secondary | ICD-10-CM | POA: Diagnosis not present

## 2015-01-30 DIAGNOSIS — R0789 Other chest pain: Secondary | ICD-10-CM | POA: Diagnosis not present

## 2015-01-31 DIAGNOSIS — I2510 Atherosclerotic heart disease of native coronary artery without angina pectoris: Secondary | ICD-10-CM | POA: Diagnosis not present

## 2015-01-31 DIAGNOSIS — C250 Malignant neoplasm of head of pancreas: Secondary | ICD-10-CM | POA: Diagnosis not present

## 2015-01-31 DIAGNOSIS — Z510 Encounter for antineoplastic radiation therapy: Secondary | ICD-10-CM | POA: Diagnosis not present

## 2015-01-31 DIAGNOSIS — R918 Other nonspecific abnormal finding of lung field: Secondary | ICD-10-CM | POA: Diagnosis not present

## 2015-01-31 DIAGNOSIS — R7989 Other specified abnormal findings of blood chemistry: Secondary | ICD-10-CM | POA: Diagnosis not present

## 2015-01-31 DIAGNOSIS — K269 Duodenal ulcer, unspecified as acute or chronic, without hemorrhage or perforation: Secondary | ICD-10-CM | POA: Diagnosis not present

## 2015-01-31 DIAGNOSIS — N281 Cyst of kidney, acquired: Secondary | ICD-10-CM | POA: Diagnosis not present

## 2015-02-01 DIAGNOSIS — C250 Malignant neoplasm of head of pancreas: Secondary | ICD-10-CM | POA: Diagnosis not present

## 2015-02-01 DIAGNOSIS — Z510 Encounter for antineoplastic radiation therapy: Secondary | ICD-10-CM | POA: Diagnosis not present

## 2015-02-02 DIAGNOSIS — I10 Essential (primary) hypertension: Secondary | ICD-10-CM | POA: Diagnosis not present

## 2015-02-02 DIAGNOSIS — R55 Syncope and collapse: Secondary | ICD-10-CM | POA: Diagnosis not present

## 2015-02-02 DIAGNOSIS — R0789 Other chest pain: Secondary | ICD-10-CM | POA: Diagnosis not present

## 2015-02-02 DIAGNOSIS — Z510 Encounter for antineoplastic radiation therapy: Secondary | ICD-10-CM | POA: Diagnosis not present

## 2015-02-02 DIAGNOSIS — C250 Malignant neoplasm of head of pancreas: Secondary | ICD-10-CM | POA: Diagnosis not present

## 2015-02-02 DIAGNOSIS — R0602 Shortness of breath: Secondary | ICD-10-CM | POA: Diagnosis not present

## 2015-02-03 DIAGNOSIS — I517 Cardiomegaly: Secondary | ICD-10-CM | POA: Diagnosis not present

## 2015-02-03 DIAGNOSIS — I340 Nonrheumatic mitral (valve) insufficiency: Secondary | ICD-10-CM | POA: Diagnosis not present

## 2015-02-03 DIAGNOSIS — C250 Malignant neoplasm of head of pancreas: Secondary | ICD-10-CM | POA: Diagnosis not present

## 2015-02-03 DIAGNOSIS — Z510 Encounter for antineoplastic radiation therapy: Secondary | ICD-10-CM | POA: Diagnosis not present

## 2015-02-03 DIAGNOSIS — R55 Syncope and collapse: Secondary | ICD-10-CM | POA: Diagnosis not present

## 2015-02-03 DIAGNOSIS — R000 Tachycardia, unspecified: Secondary | ICD-10-CM | POA: Diagnosis not present

## 2015-02-06 DIAGNOSIS — C250 Malignant neoplasm of head of pancreas: Secondary | ICD-10-CM | POA: Diagnosis not present

## 2015-02-06 DIAGNOSIS — R55 Syncope and collapse: Secondary | ICD-10-CM | POA: Diagnosis not present

## 2015-02-06 DIAGNOSIS — R000 Tachycardia, unspecified: Secondary | ICD-10-CM | POA: Diagnosis not present

## 2015-02-06 DIAGNOSIS — Z510 Encounter for antineoplastic radiation therapy: Secondary | ICD-10-CM | POA: Diagnosis not present

## 2015-02-06 DIAGNOSIS — I340 Nonrheumatic mitral (valve) insufficiency: Secondary | ICD-10-CM | POA: Diagnosis not present

## 2015-02-06 DIAGNOSIS — I517 Cardiomegaly: Secondary | ICD-10-CM | POA: Diagnosis not present

## 2015-02-07 DIAGNOSIS — C250 Malignant neoplasm of head of pancreas: Secondary | ICD-10-CM | POA: Diagnosis not present

## 2015-02-07 DIAGNOSIS — Z510 Encounter for antineoplastic radiation therapy: Secondary | ICD-10-CM | POA: Diagnosis not present

## 2015-02-08 DIAGNOSIS — C250 Malignant neoplasm of head of pancreas: Secondary | ICD-10-CM | POA: Diagnosis not present

## 2015-02-08 DIAGNOSIS — Z510 Encounter for antineoplastic radiation therapy: Secondary | ICD-10-CM | POA: Diagnosis not present

## 2015-02-09 DIAGNOSIS — Z510 Encounter for antineoplastic radiation therapy: Secondary | ICD-10-CM | POA: Diagnosis not present

## 2015-02-09 DIAGNOSIS — C250 Malignant neoplasm of head of pancreas: Secondary | ICD-10-CM | POA: Diagnosis not present

## 2015-02-09 DIAGNOSIS — R7989 Other specified abnormal findings of blood chemistry: Secondary | ICD-10-CM | POA: Diagnosis not present

## 2015-02-09 DIAGNOSIS — R918 Other nonspecific abnormal finding of lung field: Secondary | ICD-10-CM | POA: Diagnosis not present

## 2015-02-09 DIAGNOSIS — Z881 Allergy status to other antibiotic agents status: Secondary | ICD-10-CM | POA: Diagnosis not present

## 2015-02-09 DIAGNOSIS — K269 Duodenal ulcer, unspecified as acute or chronic, without hemorrhage or perforation: Secondary | ICD-10-CM | POA: Diagnosis not present

## 2015-02-10 DIAGNOSIS — C250 Malignant neoplasm of head of pancreas: Secondary | ICD-10-CM | POA: Diagnosis not present

## 2015-02-10 DIAGNOSIS — Z510 Encounter for antineoplastic radiation therapy: Secondary | ICD-10-CM | POA: Diagnosis not present

## 2015-02-13 DIAGNOSIS — C250 Malignant neoplasm of head of pancreas: Secondary | ICD-10-CM | POA: Diagnosis not present

## 2015-02-13 DIAGNOSIS — Z510 Encounter for antineoplastic radiation therapy: Secondary | ICD-10-CM | POA: Diagnosis not present

## 2015-02-14 DIAGNOSIS — Z510 Encounter for antineoplastic radiation therapy: Secondary | ICD-10-CM | POA: Diagnosis not present

## 2015-02-14 DIAGNOSIS — C250 Malignant neoplasm of head of pancreas: Secondary | ICD-10-CM | POA: Diagnosis not present

## 2015-02-16 DIAGNOSIS — C250 Malignant neoplasm of head of pancreas: Secondary | ICD-10-CM | POA: Diagnosis not present

## 2015-02-16 DIAGNOSIS — Z510 Encounter for antineoplastic radiation therapy: Secondary | ICD-10-CM | POA: Diagnosis not present

## 2015-02-17 DIAGNOSIS — Z510 Encounter for antineoplastic radiation therapy: Secondary | ICD-10-CM | POA: Diagnosis not present

## 2015-02-17 DIAGNOSIS — C250 Malignant neoplasm of head of pancreas: Secondary | ICD-10-CM | POA: Diagnosis not present

## 2015-02-19 ENCOUNTER — Emergency Department (HOSPITAL_COMMUNITY): Payer: Medicare Other

## 2015-02-19 ENCOUNTER — Encounter (HOSPITAL_COMMUNITY): Payer: Self-pay

## 2015-02-19 ENCOUNTER — Emergency Department (HOSPITAL_COMMUNITY)
Admission: EM | Admit: 2015-02-19 | Discharge: 2015-02-19 | Disposition: A | Discharge status: Home / Self Care | Payer: Medicare Other | Attending: Emergency Medicine | Admitting: Emergency Medicine

## 2015-02-19 DIAGNOSIS — Z9889 Other specified postprocedural states: Secondary | ICD-10-CM | POA: Insufficient documentation

## 2015-02-19 DIAGNOSIS — I10 Essential (primary) hypertension: Secondary | ICD-10-CM | POA: Insufficient documentation

## 2015-02-19 DIAGNOSIS — R42 Dizziness and giddiness: Secondary | ICD-10-CM | POA: Insufficient documentation

## 2015-02-19 DIAGNOSIS — C259 Malignant neoplasm of pancreas, unspecified: Secondary | ICD-10-CM | POA: Diagnosis not present

## 2015-02-19 DIAGNOSIS — M549 Dorsalgia, unspecified: Secondary | ICD-10-CM | POA: Insufficient documentation

## 2015-02-19 DIAGNOSIS — R1013 Epigastric pain: Secondary | ICD-10-CM

## 2015-02-19 DIAGNOSIS — R0989 Other specified symptoms and signs involving the circulatory and respiratory systems: Secondary | ICD-10-CM | POA: Diagnosis not present

## 2015-02-19 DIAGNOSIS — M109 Gout, unspecified: Secondary | ICD-10-CM | POA: Insufficient documentation

## 2015-02-19 DIAGNOSIS — K219 Gastro-esophageal reflux disease without esophagitis: Secondary | ICD-10-CM | POA: Diagnosis not present

## 2015-02-19 DIAGNOSIS — Z86010 Personal history of colonic polyps: Secondary | ICD-10-CM | POA: Diagnosis not present

## 2015-02-19 DIAGNOSIS — R109 Unspecified abdominal pain: Secondary | ICD-10-CM

## 2015-02-19 DIAGNOSIS — Z87891 Personal history of nicotine dependence: Secondary | ICD-10-CM | POA: Insufficient documentation

## 2015-02-19 DIAGNOSIS — Z79899 Other long term (current) drug therapy: Secondary | ICD-10-CM | POA: Insufficient documentation

## 2015-02-19 DIAGNOSIS — R000 Tachycardia, unspecified: Secondary | ICD-10-CM | POA: Diagnosis not present

## 2015-02-19 DIAGNOSIS — A419 Sepsis, unspecified organism: Secondary | ICD-10-CM | POA: Diagnosis not present

## 2015-02-19 HISTORY — DX: Malignant neoplasm of pancreas, unspecified: C25.9

## 2015-02-19 LAB — COMPREHENSIVE METABOLIC PANEL
ALT: 72 U/L — ABNORMAL HIGH (ref 17–63)
ANION GAP: 14 (ref 5–15)
AST: 114 U/L — ABNORMAL HIGH (ref 15–41)
Albumin: 3.5 g/dL (ref 3.5–5.0)
Alkaline Phosphatase: 117 U/L (ref 38–126)
BUN: 22 mg/dL — ABNORMAL HIGH (ref 6–20)
CHLORIDE: 104 mmol/L (ref 101–111)
CO2: 20 mmol/L — ABNORMAL LOW (ref 22–32)
Calcium: 8.9 mg/dL (ref 8.9–10.3)
Creatinine, Ser: 1.72 mg/dL — ABNORMAL HIGH (ref 0.61–1.24)
GFR, EST AFRICAN AMERICAN: 42 mL/min — AB (ref >60)
GFR, EST NON AFRICAN AMERICAN: 37 mL/min — AB (ref >60)
Glucose, Bld: 150 mg/dL — ABNORMAL HIGH (ref 65–99)
POTASSIUM: 4.6 mmol/L (ref 3.5–5.1)
Sodium: 138 mmol/L (ref 135–145)
Total Bilirubin: 3.8 mg/dL — ABNORMAL HIGH (ref 0.3–1.2)
Total Protein: 7.1 g/dL (ref 6.5–8.1)

## 2015-02-19 LAB — URINALYSIS, ROUTINE W REFLEX MICROSCOPIC
GLUCOSE, UA: NEGATIVE mg/dL
Hgb urine dipstick: NEGATIVE
Ketones, ur: NEGATIVE mg/dL
LEUKOCYTES UA: NEGATIVE
Nitrite: NEGATIVE
PH: 5.5 (ref 5.0–8.0)
PROTEIN: NEGATIVE mg/dL
Specific Gravity, Urine: <1.005 — ABNORMAL LOW (ref 1.005–1.030)

## 2015-02-19 LAB — CBC
HEMATOCRIT: 34.8 % — AB (ref 39.0–52.0)
Hemoglobin: 12 g/dL — ABNORMAL LOW (ref 13.0–17.0)
MCH: 32.5 pg (ref 26.0–34.0)
MCHC: 34.5 g/dL (ref 30.0–36.0)
MCV: 94.3 fL (ref 78.0–100.0)
Platelets: 80 10*3/uL — ABNORMAL LOW (ref 150–400)
RBC: 3.69 MIL/uL — AB (ref 4.22–5.81)
RDW: 15.4 % (ref 11.5–15.5)
WBC: 6.8 10*3/uL (ref 4.0–10.5)

## 2015-02-19 LAB — LACTIC ACID, PLASMA
LACTIC ACID, VENOUS: 1.9 mmol/L (ref 0.5–2.0)
Lactic Acid, Venous: 1.9 mmol/L (ref 0.5–2.0)

## 2015-02-19 LAB — LIPASE, BLOOD: LIPASE: 15 U/L (ref 11–51)

## 2015-02-19 MED ORDER — FENTANYL CITRATE (PF) 100 MCG/2ML IJ SOLN
50.0000 ug | Freq: Once | INTRAMUSCULAR | Status: AC
  Administered 2015-02-19: 50 ug via INTRAVENOUS
  Filled 2015-02-19: qty 2

## 2015-02-19 MED ORDER — IOHEXOL 300 MG/ML  SOLN
50.0000 mL | Freq: Once | INTRAMUSCULAR | Status: AC | PRN
  Administered 2015-02-19: 50 mL via ORAL

## 2015-02-19 MED ORDER — HEPARIN SOD (PORK) LOCK FLUSH 100 UNIT/ML IV SOLN
INTRAVENOUS | Status: AC
Start: 2015-02-19 — End: 2015-02-19
  Administered 2015-02-19: 500 [IU]
  Filled 2015-02-19: qty 5

## 2015-02-19 MED ORDER — IOHEXOL 300 MG/ML  SOLN
80.0000 mL | Freq: Once | INTRAMUSCULAR | Status: AC | PRN
  Administered 2015-02-19: 100 mL via INTRAVENOUS

## 2015-02-19 MED ORDER — ONDANSETRON HCL 4 MG/2ML IJ SOLN
4.0000 mg | Freq: Once | INTRAMUSCULAR | Status: AC
  Administered 2015-02-19: 4 mg via INTRAVENOUS
  Filled 2015-02-19: qty 2

## 2015-02-19 MED ORDER — SODIUM CHLORIDE 0.9 % IV SOLN: INTRAVENOUS | Status: DC

## 2015-02-19 MED ORDER — HYDROCODONE-ACETAMINOPHEN 5-325 MG PO TABS: 1.0000 | ORAL_TABLET | Freq: Four times a day (QID) | ORAL | Status: DC | PRN

## 2015-02-19 MED ORDER — SODIUM CHLORIDE 0.9 % IV BOLUS (SEPSIS)
1000.0000 mL | Freq: Once | INTRAVENOUS | Status: AC
  Administered 2015-02-19: 1000 mL via INTRAVENOUS

## 2015-02-19 NOTE — Discharge Instructions (Signed)
Take the pain medicine as directed. Return for any new or worse symptoms. Follow up with your doctors this week. In particularly for the persistent tachycardia. Return for fevers return for worse abdominal pain return for heart rate going faster or shortness of breath, or passing out.

## 2015-02-19 NOTE — ED Notes (Signed)
I-stat Lactic Acid 1.65.

## 2015-02-19 NOTE — ED Notes (Signed)
No antibiotics ordered at this time due to EDP clinical decision.

## 2015-02-19 NOTE — Consult Note (Signed)
Requesting physician: Dr. Rogene Houston, EDP  Primary Care Physician: [name], MD  Reason for consultation: Sinus Tachycardia   History of Present Illness: 80 year old man with history of pancreatic cancer currently undergoing radiation therapy, hypertension presents to the hospital today with epigastric abdominal pain. He thought he had pancreatitis. CT abdomen shows no indication of pancreatitis, lipase was within normal limits. Workup in the emergency department he was noted to have persistent sinus tachycardia initially in the 120 range that has now decreased into the 110 range. He was given some IV fluids. We are asked to see with concerns of sinus tachycardia.  Allergies:   Allergies  Allergen Reactions  . Doxycycline     rash  . Colestipol Itching      Past Medical History  Diagnosis Date  . Hypertension   . Gout   . Barrett's esophagus     last EGD/Bx 12/11  . Helicobacter pylori gastritis 2000    s/p treatment  . Adenomatous colon polyp 2010  . GERD (gastroesophageal reflux disease)   . GASTRITIS 09/23/2008    Qualifier: Diagnosis of  By: Nicole Kindred LPN, Doris    . Cancer of pancreas, other site AUG 2016    CA 19-9 958.6  . Pancreatic cancer ([place])   . Pancreatic cancer [facility])     Past Surgical History  Procedure Laterality Date  . Esophagogastroduodenoscopy  2008    DUODENITIS & GASTRITIS 2o TO NSAIDS/ETOH  . Colonoscopy  2010    simple adenomas  . Neck surgery      DISC REPLACED  . Bladder surgery  [id number]  . Sinus exploration  2000  . Tonsillectomy      AS A CHILD  . Esophagogastroduodenoscopy  12/2009    short segment Barrett's, small hh, chronic gastritis  . Nasal septum surgery    . Esophagogastroduodenoscopy  01/04/2011    [facility]:NOIB gastritis/Barrett's, possible  . Esophagogastroduodenoscopy N/A 11/30/2012    Dr. Oneida Alar- normal esophagus, empiric dilation d/t c/o dysphagia/?cervical web, stomach= mild non erosive gastritis,  inflammation on bx, duodenum= no abnormalities in the bulb and second portion of the duodenum. dilation at the gastroesphageal junction.  Azzie Almas dilation N/A 11/30/2012    Procedure: SAVORY DILATION;  Surgeon: Danie Binder, MD;  Location: [facility];  Service: Endoscopy;  Laterality: N/A;  Venia Minks dilation N/A 11/30/2012    Procedure: Venia Minks DILATION;  Surgeon: Danie Binder, MD;  Location: [facility];  Service: Endoscopy;  Laterality: N/A;  . 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 Colonoscopy N/A 07/28/2013    Dr. Oneida Alar: tubular adenoma, mild sigmoid colon diverticulosis, internal hemorhhoids   . Doppler echocardiography  2009  . Nm myoview ltd  2009  . Ercp N/A 09/14/2014    Dr. Laural Golden: sphincterotomy with 9 F plastic biliary stent placement   . Biliary stent placement N/A 09/14/2014    Procedure: BILIARY STENT PLACEMENT;  Surgeon: Rogene Houston, MD;  Location: [facility] ORS;  Service: Endoscopy;  Laterality: N/A;  . Sphincterotomy N/A 09/14/2014    Procedure: SPHINCTEROTOMY;  Surgeon: Rogene Houston, MD;  Location: [facility] OR[facility];  Service: Endoscopy;  Laterality: N/A;  . Esophagogastroduodenoscopy (egd) with propofol N/A 01/04/2015    Procedure: ESOPHAGOGASTRODUODENOSCOPY (EGD) WITH PROPOFOL;  Surgeon: Daneil Dolin, MD;  Location: [facility] OR[facility];  Service: Endoscopy;  Laterality: N/A;  . Ercp N/A 01/04/2015    Procedure: ATTEMPTED ENDOSCOPIC RETROGRADE CHOLANGIOPANCREATOGRAPHY (ERCP);  Surgeon: Daneil Dolin, MD;  Location: [facility] OR[facility];  Service:  Endoscopy;  Laterality: N/A;  . Stents in bile ducts      Scheduled Meds:  Continuous Infusions: . [START ON 02/20/2015] sodium chloride     PRN Meds:.  Social History:  reports that he has quit smoking. His smoking use included Cigarettes. He smoked 3.00 packs per day. He has never used smokeless tobacco. He reports that he does not drink alcohol or use illicit drugs.  Family History  Problem Relation Age of Onset  . Stomach cancer Father 58     deceased  . Colon cancer Neg Hx     Review of Systems:  Constitutional: Denies fever, chills, diaphoresis, appetite change and fatigue.  HEENT: Denies photophobia, eye pain, redness, hearing loss, ear pain, congestion, sore throat, rhinorrhea, sneezing, mouth sores, trouble swallowing, neck pain, neck stiffness and tinnitus.   Respiratory: Denies SOB, DOE, cough, chest tightness,  and wheezing.   Cardiovascular: Denies chest pain, palpitations and leg swelling.  Gastrointestinal: Denies nausea, vomiting, abdominal pain, diarrhea, constipation, blood in stool and abdominal distention.  Genitourinary: Denies dysuria, urgency, frequency, hematuria, flank pain and difficulty urinating.  Endocrine: Denies: hot or cold intolerance, sweats, changes in hair or nails, polyuria, polydipsia. Musculoskeletal: Denies myalgias, back pain, joint swelling, arthralgias and gait problem.  Skin: Denies pallor, rash and wound.  Neurological: Denies dizziness, seizures, syncope, weakness, light-headedness, numbness and headaches.  Hematological: Denies adenopathy. Easy bruising, personal or family bleeding history  Psychiatric/Behavioral: Denies suicidal ideation, mood changes, confusion, nervousness, sleep disturbance and agitation   Physical Exam: Blood pressure 107/57, pulse 111, temperature 100.1 F (37.8 C), temperature source Oral, resp. rate 20, height 5' 9"  (1.753 m), weight 89.812 kg (198 lb), SpO2 93 %. Gen.: Alert, awake, oriented 3 HEENT: Normocephalic, atraumatic, pupils equal and reactive to light, somewhat dry mucous membranes Neck: Supple, no JVD, no lymphadenopathy, no bruits, no goiter & cardiovascular: Tachycardic, regular rhythm, no murmurs Lungs: Clear to auscultation bilaterally Abdomen, soft, tender to palpation to the epigastrium and left upper quadrant, positive bowel sounds Extremities: No clubbing, cyanosis or edema Neurologic: Grossly intact focal  Labs on Admission:  Results  for orders placed or performed during the hospital encounter of 02/19/15 (from the past 48 hour(s))  Lipase, blood     Status: None   Collection Time: 02/19/15  9:36 AM  Result Value Ref Range   Lipase 15 11 - 51 U/L  Comprehensive metabolic panel     Status: Abnormal   Collection Time: 02/19/15  9:36 AM  Result Value Ref Range   Sodium 138 135 - 145 mmol/L   Potassium 4.6 3.5 - 5.1 mmol/L   Chloride 104 101 - 111 mmol/L   CO2 20 (L) 22 - 32 mmol/L   Glucose, Bld 150 (H) 65 - 99 mg/dL   BUN 22 (H) 6 - 20 mg/dL   Creatinine, Ser 1.72 (H) 0.61 - 1.24 mg/dL   Calcium 8.9 8.9 - 10.3 mg/dL   Total Protein 7.1 6.5 - 8.1 g/dL   Albumin 3.5 3.5 - 5.0 g/dL   AST 114 (H) 15 - 41 U/L   ALT 72 (H) 17 - 63 U/L   Alkaline Phosphatase 117 38 - 126 U/L   Total Bilirubin 3.8 (H) 0.3 - 1.2 mg/dL   GFR calc non Af Amer 37 (L) >60 mL/min   GFR calc Af Amer 42 (L) >60 mL/min    Comment: (NOTE) The eGFR has been calculated using the CKD EPI equation. This calculation has not been validated in all clinical  situations. eGFR's persistently <60 mL/min signify possible Chronic Kidney Disease.    Anion gap 14 5 - 15  CBC     Status: Abnormal   Collection Time: 02/19/15  9:36 AM  Result Value Ref Range   WBC 6.8 4.0 - 10.5 K/uL   RBC 3.69 (L) 4.22 - 5.81 MIL/uL   Hemoglobin 12.0 (L) 13.0 - 17.0 g/dL   HCT 34.8 (L) 39.0 - 52.0 %   MCV 94.3 78.0 - 100.0 fL   MCH 32.5 26.0 - 34.0 pg   MCHC 34.5 30.0 - 36.0 g/dL   RDW 15.4 11.5 - 15.5 %   Platelets 80 (L) 150 - 400 K/uL    Comment: SPECIMEN CHECKED FOR CLOTS PLATELET COUNT CONFIRMED BY SMEAR   Lactic acid, plasma     Status: None   Collection Time: 02/19/15  9:36 AM  Result Value Ref Range   Lactic Acid, Venous 1.9 0.5 - 2.0 mmol/L  Lactic acid, plasma     Status: None   Collection Time: 02/19/15 12:46 PM  Result Value Ref Range   Lactic Acid, Venous 1.9 0.5 - 2.0 mmol/L  Urinalysis, Routine w reflex microscopic (not at [facility])     Status:  Abnormal   Collection Time: 02/19/15  1:20 PM  Result Value Ref Range   Color, Urine YELLOW YELLOW   APPearance CLEAR CLEAR   Specific Gravity, Urine <1.005 (L) 1.005 - 1.030   pH 5.5 5.0 - 8.0   Glucose, UA NEGATIVE NEGATIVE mg/dL   Hgb urine dipstick NEGATIVE NEGATIVE   Bilirubin Urine MODERATE (A) NEGATIVE   Ketones, ur NEGATIVE NEGATIVE mg/dL   Protein, ur NEGATIVE NEGATIVE mg/dL   Nitrite NEGATIVE NEGATIVE   Leukocytes, UA NEGATIVE NEGATIVE    Comment: MICROSCOPIC NOT DONE ON URINES WITH NEGATIVE PROTEIN, BLOOD, LEUKOCYTES, NITRITE, OR GLUCOSE <1000 mg/dL.    Radiological Exams on Admission: Ct Abdomen Pelvis W Contrast  02/19/2015  CLINICAL DATA:  80 year old with diagnosis of pancreatic cancer in August, 2016 for which he is currently receiving radiation therapy, presenting with acute onset of epigastric abdominal pain radiating into the back and nausea that began yesterday. Patient has an indwelling bile duct stent. EXAM: CT ABDOMEN AND PELVIS WITH CONTRAST TECHNIQUE: Multidetector CT imaging of the abdomen and pelvis was performed using the standard protocol following bolus administration of intravenous contrast. CONTRAST:  171m OMNIPAQUE IOHEXOL 300 MG/ML IV. Oral contrast was also administered. COMPARISON:  Unenhanced CT abdomen pelvis 01/02/2015. MRI abdomen/MRCP 09/06/2014. FINDINGS: Lower chest: Innumerable very small nodules throughout the visualized lung bases. A pleural-based nodule deep in the right lower lobe has slightly increased in size since the prior CT, currently measuring approximately 1.8 x 0.9 cm (previously 1.5 x 0.8 cm). Linear scarring in the right middle lobe, lingula and both lower lobes. No confluent airspace consolidation. Heart size upper normal. Hepatobiliary: Stent within the common bile duct extending up into the common hepatic duct with its distal end appropriately positioned in the duodenum. Mildly distended gallbladder without calcified gallstones. No  convincing evidence of acute cholecystitis. Residual intrahepatic and extrahepatic biliary ductal dilation, the common duct having a maximum diameter approximating 2.0 cm, unchanged. No evidence of liver metastases. Stable approximate 1 cm cyst in the anterior segment right lobe adjacent to the gallbladder fossa. Pancreas: Low-attenuation mass involving the head of the pancreas with measurements approximating 3.6 x 3.1 x 3.2 cm. Post radiation edema/fibrosis in the adjacent fat. The biliary stent passes through the mass. Atrophy of the remainder  of the pancreas with pancreatic ductal dilation. Spleen: Moderate splenomegaly, spleen measuring approximately 11.2 x 8.3 x 16.2 cm, yielding a volume of approximately 753 ml. No focal splenic parenchymal abnormality. Adrenals/Urinary Tract: Normal appearing adrenal glands. Left renal atrophy. Focal areas of scarring involving the right kidney. Cortical cysts involving both kidneys. No solid renal masses. No urinary tract calculi. Urinary bladder unremarkable. Stomach/Bowel: Stomach normal in appearance for the degree of distention. Normal-appearing small bowel. Entire colon decompressed. Scattered diverticula involving the distal transverse colon and sigmoid colon without evidence of acute diverticulitis. Normal appendix in the right upper pelvis. Vascular/Lymphatic: Mild aortoiliac atherosclerosis without aneurysm. Patent visceral arteries. Aortocaval lymph nodes have decreased in size since the prior CT. Retrocaval lymph node is unchanged. No new or enlarging lymphadenopathy. Reproductive: Mild to moderate median lobe prostate gland enlargement. Normal seminal vesicles. Other: Mild left gynecomastia. Musculoskeletal: DISH involving the lower thoracic spine. Multilevel degenerative disc disease, spondylosis and facet degenerative changes throughout the lumbar spine. No evidence of osseous metastatic disease. IMPRESSION: 1. Pancreatic head mass with measurements given  above, likely not significantly changed since the unenhanced CT 01/02/2015, though the mass was difficult to measure accurately on that examination. 2. Stable intra and extrahepatic biliary ductal dilation with biliary stent in place, appropriately positioned. 3. Interval decrease in size of aortocaval lymph nodes and stable solitary retrocaval lymph node since the prior CT. No new or enlarging metastatic lymphadenopathy. 4. Mild gallbladder distention without calcified gallstones and without convincing evidence of acute cholecystitis. 5. Stable moderate splenomegaly without focal splenic parenchymal abnormality. 6. Atrophic left kidney and scarring throughout the right kidney as noted previously. 7. Stable innumerable nodules in the visualized lung bases, the majority of which appear peribronchovascular. Non-emergent dedicated high-resolution CT of the chest may be helpful in further evaluation. Electronically Signed   By: Evangeline Dakin M.D.   On: 02/19/2015 13:12   Dg Chest Port 1 View  02/19/2015  CLINICAL DATA:  Sepsis, nausea, abdominal pain EXAM: PORTABLE CHEST 1 VIEW COMPARISON:  09/02/2013 FINDINGS: Left subclavian double lumen power port catheter tip lower SVC level. Mild cardiomegaly with vascular congestion and basilar atelectasis. No focal pneumonia, collapse or consolidation. No edema, effusion or pneumothorax. Trachea midline. IMPRESSION: Mild cardiomegaly with vascular congestion. Basilar atelectasis No other acute process Electronically Signed   By: Jerilynn Mages.  Shick M.D.   On: 02/19/2015 10:44    Assessment/Plan  Pancreatic cancer -Patient was improved with medications received in the emergency department. -He is currently undergoing neoadjuvant radiation therapy with plans for surgery in the near future. -Follow-up with his oncologist and radiation oncologist as an outpatient.  Sinus tachycardia -Suspect this is a result of mild dehydration plus pain, has improved in the emergency  department into the 115 range with IV fluids and treatment of pain. -Do not believe he requires admission for this purpose at this time. -Suspect he is safe to discharge home today with outpatient follow-up.   Time Spent on Consultation: 75 minutes  HERNANDEZ ACOSTA,ESTELA [facility]  [phone] 02/19/2015, 3:08 PM

## 2015-02-19 NOTE — ED Provider Notes (Addendum)
CSN: [id number]     Arrival date & time 02/19/15  0919 History  By signing my name below, I, Jolayne Panther, attest that this documentation has been prepared under the direction and in the presence of Fredia Sorrow, MD. Electronically Signed: Jolayne Panther, Scribe. 02/19/2015. 10:18 AM.  Chief Complaint  Patient presents with  . Abdominal Pain   The history is provided by the patient. No language interpreter was used.   HPI Comments: William Rivera is a 80 y.o. male with a hx of pancreatic cancer, diagnosed in August of 2016, who presents to the Emergency Department complaining of sudden onset, gradually worsening epigastric abdominal pain, worse at night, with radiation into his back, which began yesterday. Pt states that his pain is currently a 7/10. He notes associated light headedness, dizziness, chills and nausea. He also reports chest pain yesterday which he suspects was caused by the peanuts he ate. Pt receives radiation five days a week and has completed 3/4 of his treatments so far. He denies fever, cough, rhinorrhea, sore throat, HA, vomiting, diarrhea, SOB, rash, ankle swelling, visual disturbance, dysuria, and hematuria.   Past Medical History  Diagnosis Date  . Hypertension   . Gout   . Barrett's esophagus     last EGD/Bx 12/11  . Helicobacter pylori gastritis 2000    s/p treatment  . Adenomatous colon polyp 2010  . GERD (gastroesophageal reflux disease)   . GASTRITIS 09/23/2008    Qualifier: Diagnosis of  By: Nicole Kindred LPN, Doris    . Cancer of pancreas, other site AUG 2016    CA 19-9 958.6  . Pancreatic cancer ([place])   . Pancreatic cancer [facility])    Past Surgical History  Procedure Laterality Date  . Esophagogastroduodenoscopy  2008    DUODENITIS & GASTRITIS 2o TO NSAIDS/ETOH  . Colonoscopy  2010    simple adenomas  . Neck surgery      DISC REPLACED  . Bladder surgery  [id number]  . Sinus exploration  2000  . Tonsillectomy      AS A CHILD  .  Esophagogastroduodenoscopy  12/2009    short segment Barrett's, small hh, chronic gastritis  . Nasal septum surgery    . Esophagogastroduodenoscopy  01/04/2011    XKG:YJEH gastritis/Barrett's, possible  . Esophagogastroduodenoscopy N/A 11/30/2012    Dr. Oneida Alar- normal esophagus, empiric dilation d/t c/o dysphagia/?cervical web, stomach= mild non erosive gastritis, inflammation on bx, duodenum= no abnormalities in the bulb and second portion of the duodenum. dilation at the gastroesphageal junction.  Azzie Almas dilation N/A 11/30/2012    Procedure: SAVORY DILATION;  Surgeon: Danie Binder, MD;  Location: [facility];  Service: Endoscopy;  Laterality: N/A;  Venia Minks dilation N/A 11/30/2012    Procedure: Venia Minks DILATION;  Surgeon: Danie Binder, MD;  Location: [facility];  Service: Endoscopy;  Laterality: N/A;  . 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 Colonoscopy N/A 07/28/2013    Dr. Oneida Alar: tubular adenoma, mild sigmoid colon diverticulosis, internal hemorhhoids   . Doppler echocardiography  2009  . Nm myoview ltd  2009  . Ercp N/A 09/14/2014    Dr. Laural Golden: sphincterotomy with 18 F plastic biliary stent placement   . Biliary stent placement N/A 09/14/2014    Procedure: BILIARY STENT PLACEMENT;  Surgeon: Rogene Houston, MD;  Location: [facility] ORS;  Service: Endoscopy;  Laterality: N/A;  . Sphincterotomy N/A 09/14/2014    Procedure: SPHINCTEROTOMY;  Surgeon: Rogene Houston, MD;  Location: [facility] OR[facility];  Service: Endoscopy;  Laterality: N/A;  . Esophagogastroduodenoscopy (egd) with propofol N/A 01/04/2015    Procedure: ESOPHAGOGASTRODUODENOSCOPY (EGD) WITH PROPOFOL;  Surgeon: Daneil Dolin, MD;  Location: [facility] OR[facility];  Service: Endoscopy;  Laterality: N/A;  . Ercp N/A 01/04/2015    Procedure: ATTEMPTED ENDOSCOPIC RETROGRADE CHOLANGIOPANCREATOGRAPHY (ERCP);  Surgeon: Daneil Dolin, MD;  Location: [facility] OR[facility];  Service: Endoscopy;  Laterality: N/A;  . Stents in bile ducts     Family History  Problem  Relation Age of Onset  . Stomach cancer Father 11    deceased  . Colon cancer Neg Hx    Social History  Substance Use Topics  . Smoking status: Former Smoker -- 3.00 packs/day    Types: Cigarettes  . Smokeless tobacco: Never Used     Comment: quit age 41 - 1 pack weekly   . Alcohol Use: No     Comment: drinks about a pint of borboun per week    Review of Systems  Constitutional: Positive for chills. Negative for fever.  HENT: Negative for rhinorrhea and sore throat.   Eyes: Negative for visual disturbance.  Respiratory: Negative for cough and shortness of breath.   Cardiovascular: Positive for chest pain. Negative for leg swelling.  Gastrointestinal: Positive for nausea and abdominal pain. Negative for vomiting and diarrhea.  Genitourinary: Negative for dysuria and hematuria.  Musculoskeletal: Positive for back pain.  Skin: Negative for rash.  Neurological: Positive for dizziness and light-headedness. Negative for headaches.  Hematological: Does not bruise/bleed easily.  Psychiatric/Behavioral: Negative for confusion.   Allergies  Doxycycline and Colestipol  Home Medications   Prior to Admission medications   Medication Sig Start Date End Date Taking? Authorizing Provider  allopurinol (ZYLOPRIM) 100 MG tablet Take 100 mg by mouth daily.     Yes Historical Provider, MD  atenolol (TENORMIN) 25 MG tablet Take 12.5 mg by mouth every other day.   Yes Historical Provider, MD  HYDROcodone-acetaminophen (NORCO/VICODIN) 5-325 MG per tablet TAKE 1 TABLET BY MOUTH EVERY 6 HOURS AS NEEDED FOR MODERATE TO SEVERE PAIN. 09/27/14  Yes Historical Provider, MD  ondansetron (ZOFRAN) 8 MG tablet Take 8 mg by mouth every 8 (eight) hours as needed for nausea or vomiting.  10/06/14  Yes Historical Provider, MD  pantoprazole (PROTONIX) 40 MG tablet Take 1 tablet by mouth daily. 01/07/15  Yes Historical Provider, MD  ranitidine (ZANTAC 150 MAXIMUM STRENGTH) 150 MG tablet Take 150 mg by mouth as needed  for heartburn.   Yes Historical Provider, MD   BP 93/50 mmHg  Pulse 115  Temp(Src) 100.1 F (37.8 C) (Oral)  Resp 20  Ht 5' 9"  (1.753 m)  Wt 89.812 kg  BMI 29.23 kg/m2  SpO2 92% Physical Exam  Constitutional: He is oriented to person, place, and time. He appears well-developed and well-nourished. No distress.  HENT:  Head: Normocephalic and atraumatic.  Mucous membranes mildly dry  Eyes: Conjunctivae and EOM are normal. Pupils are equal, round, and reactive to light.  Cardiovascular: Regular rhythm.   No murmur heard. Tachycardic but regular  Pulmonary/Chest: Effort normal and breath sounds normal.  Abdominal: Soft. Bowel sounds are normal. He exhibits no distension. There is tenderness. There is no guarding.  Mild epigastric tenderness but no guarding   Musculoskeletal:  No edema to the ankle area  Neurological: He is alert and oriented to person, place, and time. He has normal reflexes. No cranial nerve deficit. He exhibits normal muscle tone. Coordination normal.  Skin: Skin is warm and dry.  Psychiatric: He has a normal mood and affect.  Nursing note and vitals reviewed.   ED Course  Procedures  DIAGNOSTIC STUDIES:    Oxygen Saturation is 94% on RA, adequate by my interpretation.   COORDINATION OF CARE:  9:54 AM Will order CT scan of abdomen. Discussed treatment plan with pt at bedside and pt agreed to plan.   Results for orders placed or performed during the hospital encounter of 02/19/15  Lipase, blood  Result Value Ref Range   Lipase 15 11 - 51 U/L  Comprehensive metabolic panel  Result Value Ref Range   Sodium 138 135 - 145 mmol/L   Potassium 4.6 3.5 - 5.1 mmol/L   Chloride 104 101 - 111 mmol/L   CO2 20 (L) 22 - 32 mmol/L   Glucose, Bld 150 (H) 65 - 99 mg/dL   BUN 22 (H) 6 - 20 mg/dL   Creatinine, Ser 1.72 (H) 0.61 - 1.24 mg/dL   Calcium 8.9 8.9 - 10.3 mg/dL   Total Protein 7.1 6.5 - 8.1 g/dL   Albumin 3.5 3.5 - 5.0 g/dL   AST 114 (H) 15 - 41 U/L    ALT 72 (H) 17 - 63 U/L   Alkaline Phosphatase 117 38 - 126 U/L   Total Bilirubin 3.8 (H) 0.3 - 1.2 mg/dL   GFR calc non Af Amer 37 (L) >60 mL/min   GFR calc Af Amer 42 (L) >60 mL/min   Anion gap 14 5 - 15  CBC  Result Value Ref Range   WBC 6.8 4.0 - 10.5 K/uL   RBC 3.69 (L) 4.22 - 5.81 MIL/uL   Hemoglobin 12.0 (L) 13.0 - 17.0 g/dL   HCT 34.8 (L) 39.0 - 52.0 %   MCV 94.3 78.0 - 100.0 fL   MCH 32.5 26.0 - 34.0 pg   MCHC 34.5 30.0 - 36.0 g/dL   RDW 15.4 11.5 - 15.5 %   Platelets 80 (L) 150 - 400 K/uL  Urinalysis, Routine w reflex microscopic (not at [facility])  Result Value Ref Range   Color, Urine YELLOW YELLOW   APPearance CLEAR CLEAR   Specific Gravity, Urine <1.005 (L) 1.005 - 1.030   pH 5.5 5.0 - 8.0   Glucose, UA NEGATIVE NEGATIVE mg/dL   Hgb urine dipstick NEGATIVE NEGATIVE   Bilirubin Urine MODERATE (A) NEGATIVE   Ketones, ur NEGATIVE NEGATIVE mg/dL   Protein, ur NEGATIVE NEGATIVE mg/dL   Nitrite NEGATIVE NEGATIVE   Leukocytes, UA NEGATIVE NEGATIVE  Lactic acid, plasma  Result Value Ref Range   Lactic Acid, Venous 1.9 0.5 - 2.0 mmol/L  Lactic acid, plasma  Result Value Ref Range   Lactic Acid, Venous 1.9 0.5 - 2.0 mmol/L   Ct Abdomen Pelvis W Contrast  02/19/2015  CLINICAL DATA:  80 year old with diagnosis of pancreatic cancer in August, 2016 for which he is currently receiving radiation therapy, presenting with acute onset of epigastric abdominal pain radiating into the back and nausea that began yesterday. Patient has an indwelling bile duct stent. EXAM: CT ABDOMEN AND PELVIS WITH CONTRAST TECHNIQUE: Multidetector CT imaging of the abdomen and pelvis was performed using the standard protocol following bolus administration of intravenous contrast. CONTRAST:  178m OMNIPAQUE IOHEXOL 300 MG/ML IV. Oral contrast was also administered. COMPARISON:  Unenhanced CT abdomen pelvis 01/02/2015. MRI abdomen/MRCP 09/06/2014. FINDINGS: Lower chest: Innumerable very small nodules throughout  the visualized lung bases. A pleural-based nodule deep in the right lower lobe has slightly increased in size since the prior CT,  currently measuring approximately 1.8 x 0.9 cm (previously 1.5 x 0.8 cm). Linear scarring in the right middle lobe, lingula and both lower lobes. No confluent airspace consolidation. Heart size upper normal. Hepatobiliary: Stent within the common bile duct extending up into the common hepatic duct with its distal end appropriately positioned in the duodenum. Mildly distended gallbladder without calcified gallstones. No convincing evidence of acute cholecystitis. Residual intrahepatic and extrahepatic biliary ductal dilation, the common duct having a maximum diameter approximating 2.0 cm, unchanged. No evidence of liver metastases. Stable approximate 1 cm cyst in the anterior segment right lobe adjacent to the gallbladder fossa. Pancreas: Low-attenuation mass involving the head of the pancreas with measurements approximating 3.6 x 3.1 x 3.2 cm. Post radiation edema/fibrosis in the adjacent fat. The biliary stent passes through the mass. Atrophy of the remainder of the pancreas with pancreatic ductal dilation. Spleen: Moderate splenomegaly, spleen measuring approximately 11.2 x 8.3 x 16.2 cm, yielding a volume of approximately 753 ml. No focal splenic parenchymal abnormality. Adrenals/Urinary Tract: Normal appearing adrenal glands. Left renal atrophy. Focal areas of scarring involving the right kidney. Cortical cysts involving both kidneys. No solid renal masses. No urinary tract calculi. Urinary bladder unremarkable. Stomach/Bowel: Stomach normal in appearance for the degree of distention. Normal-appearing small bowel. Entire colon decompressed. Scattered diverticula involving the distal transverse colon and sigmoid colon without evidence of acute diverticulitis. Normal appendix in the right upper pelvis. Vascular/Lymphatic: Mild aortoiliac atherosclerosis without aneurysm. Patent visceral  arteries. Aortocaval lymph nodes have decreased in size since the prior CT. Retrocaval lymph node is unchanged. No new or enlarging lymphadenopathy. Reproductive: Mild to moderate median lobe prostate gland enlargement. Normal seminal vesicles. Other: Mild left gynecomastia. Musculoskeletal: DISH involving the lower thoracic spine. Multilevel degenerative disc disease, spondylosis and facet degenerative changes throughout the lumbar spine. No evidence of osseous metastatic disease. IMPRESSION: 1. Pancreatic head mass with measurements given above, likely not significantly changed since the unenhanced CT 01/02/2015, though the mass was difficult to measure accurately on that examination. 2. Stable intra and extrahepatic biliary ductal dilation with biliary stent in place, appropriately positioned. 3. Interval decrease in size of aortocaval lymph nodes and stable solitary retrocaval lymph node since the prior CT. No new or enlarging metastatic lymphadenopathy. 4. Mild gallbladder distention without calcified gallstones and without convincing evidence of acute cholecystitis. 5. Stable moderate splenomegaly without focal splenic parenchymal abnormality. 6. Atrophic left kidney and scarring throughout the right kidney as noted previously. 7. Stable innumerable nodules in the visualized lung bases, the majority of which appear peribronchovascular. Non-emergent dedicated high-resolution CT of the chest may be helpful in further evaluation. Electronically Signed   By: Evangeline Dakin M.D.   On: 02/19/2015 13:12   Dg Chest Port 1 View  02/19/2015  CLINICAL DATA:  Sepsis, nausea, abdominal pain EXAM: PORTABLE CHEST 1 VIEW COMPARISON:  09/02/2013 FINDINGS: Left subclavian double lumen power port catheter tip lower SVC level. Mild cardiomegaly with vascular congestion and basilar atelectasis. No focal pneumonia, collapse or consolidation. No edema, effusion or pneumothorax. Trachea midline. IMPRESSION: Mild cardiomegaly  with vascular congestion. Basilar atelectasis No other acute process Electronically Signed   By: Jerilynn Mages.  Shick M.D.   On: 02/19/2015 10:44    Medications  0.9 %  sodium chloride infusion (not administered)  sodium chloride 0.9 % bolus 1,000 mL (0 mLs Intravenous Stopped 02/19/15 1142)  ondansetron (ZOFRAN) injection 4 mg (4 mg Intravenous Given 02/19/15 1040)  fentaNYL (SUBLIMAZE) injection 50 mcg (50 mcg Intravenous Given 02/19/15 1040)  iohexol (OMNIPAQUE) 300 MG/ML solution 50 mL (50 mLs Oral Contrast Given 02/19/15 1210)  iohexol (OMNIPAQUE) 300 MG/ML solution 80 mL (100 mLs Intravenous Contrast Given 02/19/15 1210)  fentaNYL (SUBLIMAZE) injection 50 mcg (50 mcg Intravenous Given 02/19/15 1312)    MDM   Final diagnoses:  Tachycardia  Epigastric pain  Malignant neoplasm of pancreas, unspecified location of malignancy [facility])    Patient with a history of pancreatic cancer currently undergoing radiation therapy. Patient with epigastric abdominal pain that started yesterday associated with nausea no vomiting or diarrhea. Patient's also had the persistent tachycardia particularly here. High was 131 currently on 15. Initially there was some concerns about whether he met septic parameters based on his respiratory rate and an heart rate. Did not have a fever highest temp spell 100.1., However lactic acid is time to have been normal patient did receive 1 L of fluid but without any super change in the tachycardia. Patient relates that he has had trouble with this tachycardia will normally was on medication to slow his heart rate down but that dropped his blood pressures too much so as hematology oncology doctors stopped that. We'll have the hospitalist evaluate him for consideration for the persistent tachycardia may be continued fluids gently overnight in the hospital. Have not obviously completely ruled out pulmonary embolus. Patient does not have a distinct hypoxia so probably not a large embolus. In addition  his chest x-ray had no acute findings other than perhaps some mild vascular congestion.  Patient's labs regarding the epigastric abdominal pain no significant elevation in lipase. No significant change in his liver function stopped. CT scan raise some concern about gallbladder but no distinct evidence of gallstones. Does still have a pancreatic mass which does not seem to be changed the biliary dilatation does not seem to be changed. I think in the epigastric abdominal pain patient could be discharged home. The only question is the persistent tachycardia and blood pressures that have been waxing and waning. There has been a high systolic of 726 low systolics been 93.  Based on the lactic acids also do not feel the patient is septic. We also had a recent i-STAT lactic acid was 1.65.   I personally performed the services described in this documentation, which was scribed in my presence. The recorded information has been reviewed and is accurate.     Fredia Sorrow, MD 02/19/15 1449   Patient violated by internal medicine. Heart rate is continue to come down currently 111 blood pressures showing improvement we've had several now systolics above 203 currently 107. Internal medicine feels of patient stable for discharge home and follow-up with his doctors as an outpatient. They did see the patient in person.  Fredia Sorrow, MD 02/19/15 1511

## 2015-02-19 NOTE — ED Notes (Signed)
Pt reports upper abdominal pain that started yesterday. Complains of nausea. Denies vomiting or diarrhea. Diagnosed with pancreatic cancer in aug 2016 and receiving radiation

## 2015-02-20 DIAGNOSIS — C250 Malignant neoplasm of head of pancreas: Secondary | ICD-10-CM | POA: Diagnosis not present

## 2015-02-20 DIAGNOSIS — Z510 Encounter for antineoplastic radiation therapy: Secondary | ICD-10-CM | POA: Diagnosis not present

## 2015-02-20 LAB — I-STAT CG4 LACTIC ACID, ED
Lactic Acid, Venous: 2.37 mmol/L (ref 0.5–2.0)
Lactic Acid, Venous: 1.65 mmol/L (ref 0.5–2.0)

## 2015-02-21 DIAGNOSIS — Z510 Encounter for antineoplastic radiation therapy: Secondary | ICD-10-CM | POA: Diagnosis not present

## 2015-02-21 DIAGNOSIS — C250 Malignant neoplasm of head of pancreas: Secondary | ICD-10-CM | POA: Diagnosis not present

## 2015-02-21 LAB — URINE CULTURE

## 2015-02-22 DIAGNOSIS — Z510 Encounter for antineoplastic radiation therapy: Secondary | ICD-10-CM | POA: Diagnosis not present

## 2015-02-22 DIAGNOSIS — R21 Rash and other nonspecific skin eruption: Secondary | ICD-10-CM | POA: Diagnosis not present

## 2015-02-22 DIAGNOSIS — C250 Malignant neoplasm of head of pancreas: Secondary | ICD-10-CM | POA: Diagnosis not present

## 2015-02-22 HISTORY — PX: ERCP: SHX60

## 2015-02-23 DIAGNOSIS — C250 Malignant neoplasm of head of pancreas: Secondary | ICD-10-CM | POA: Diagnosis not present

## 2015-02-23 DIAGNOSIS — Z510 Encounter for antineoplastic radiation therapy: Secondary | ICD-10-CM | POA: Diagnosis not present

## 2015-02-23 DIAGNOSIS — R21 Rash and other nonspecific skin eruption: Secondary | ICD-10-CM | POA: Diagnosis not present

## 2015-02-24 DIAGNOSIS — Z510 Encounter for antineoplastic radiation therapy: Secondary | ICD-10-CM | POA: Diagnosis not present

## 2015-02-24 DIAGNOSIS — R21 Rash and other nonspecific skin eruption: Secondary | ICD-10-CM | POA: Diagnosis not present

## 2015-02-24 DIAGNOSIS — C250 Malignant neoplasm of head of pancreas: Secondary | ICD-10-CM | POA: Diagnosis not present

## 2015-02-24 LAB — CULTURE, BLOOD (ROUTINE X 2)
CULTURE: NO GROWTH
Culture: NO GROWTH

## 2015-02-27 DIAGNOSIS — C250 Malignant neoplasm of head of pancreas: Secondary | ICD-10-CM | POA: Diagnosis not present

## 2015-02-27 DIAGNOSIS — R21 Rash and other nonspecific skin eruption: Secondary | ICD-10-CM | POA: Diagnosis not present

## 2015-02-27 DIAGNOSIS — Z510 Encounter for antineoplastic radiation therapy: Secondary | ICD-10-CM | POA: Diagnosis not present

## 2015-02-28 DIAGNOSIS — M109 Gout, unspecified: Secondary | ICD-10-CM | POA: Diagnosis not present

## 2015-02-28 DIAGNOSIS — K219 Gastro-esophageal reflux disease without esophagitis: Secondary | ICD-10-CM | POA: Diagnosis not present

## 2015-02-28 DIAGNOSIS — Z9221 Personal history of antineoplastic chemotherapy: Secondary | ICD-10-CM | POA: Diagnosis not present

## 2015-02-28 DIAGNOSIS — L299 Pruritus, unspecified: Secondary | ICD-10-CM | POA: Diagnosis present

## 2015-02-28 DIAGNOSIS — N179 Acute kidney failure, unspecified: Secondary | ICD-10-CM | POA: Diagnosis not present

## 2015-02-28 DIAGNOSIS — K830 Cholangitis: Secondary | ICD-10-CM | POA: Diagnosis not present

## 2015-02-28 DIAGNOSIS — K315 Obstruction of duodenum: Secondary | ICD-10-CM | POA: Diagnosis present

## 2015-02-28 DIAGNOSIS — R21 Rash and other nonspecific skin eruption: Secondary | ICD-10-CM | POA: Diagnosis not present

## 2015-02-28 DIAGNOSIS — Z510 Encounter for antineoplastic radiation therapy: Secondary | ICD-10-CM | POA: Diagnosis not present

## 2015-02-28 DIAGNOSIS — I10 Essential (primary) hypertension: Secondary | ICD-10-CM | POA: Diagnosis present

## 2015-02-28 DIAGNOSIS — C259 Malignant neoplasm of pancreas, unspecified: Secondary | ICD-10-CM | POA: Diagnosis not present

## 2015-02-28 DIAGNOSIS — Z923 Personal history of irradiation: Secondary | ICD-10-CM | POA: Diagnosis not present

## 2015-02-28 DIAGNOSIS — K838 Other specified diseases of biliary tract: Secondary | ICD-10-CM | POA: Diagnosis not present

## 2015-02-28 DIAGNOSIS — K869 Disease of pancreas, unspecified: Secondary | ICD-10-CM | POA: Diagnosis not present

## 2015-02-28 DIAGNOSIS — K259 Gastric ulcer, unspecified as acute or chronic, without hemorrhage or perforation: Secondary | ICD-10-CM | POA: Diagnosis not present

## 2015-02-28 DIAGNOSIS — K831 Obstruction of bile duct: Secondary | ICD-10-CM | POA: Diagnosis not present

## 2015-02-28 DIAGNOSIS — C250 Malignant neoplasm of head of pancreas: Secondary | ICD-10-CM | POA: Diagnosis not present

## 2015-03-07 ENCOUNTER — Emergency Department (HOSPITAL_COMMUNITY): Payer: Medicare Other

## 2015-03-07 ENCOUNTER — Emergency Department (HOSPITAL_COMMUNITY)
Admission: EM | Admit: 2015-03-07 | Discharge: 2015-03-07 | Disposition: A | Discharge status: Home / Self Care | Payer: Medicare Other | Attending: Emergency Medicine | Admitting: Emergency Medicine

## 2015-03-07 ENCOUNTER — Encounter (HOSPITAL_COMMUNITY): Payer: Self-pay | Admitting: *Deleted

## 2015-03-07 DIAGNOSIS — I10 Essential (primary) hypertension: Secondary | ICD-10-CM | POA: Insufficient documentation

## 2015-03-07 DIAGNOSIS — Z79899 Other long term (current) drug therapy: Secondary | ICD-10-CM | POA: Diagnosis not present

## 2015-03-07 DIAGNOSIS — R109 Unspecified abdominal pain: Secondary | ICD-10-CM | POA: Diagnosis not present

## 2015-03-07 DIAGNOSIS — C259 Malignant neoplasm of pancreas, unspecified: Secondary | ICD-10-CM | POA: Diagnosis not present

## 2015-03-07 DIAGNOSIS — Z86010 Personal history of colonic polyps: Secondary | ICD-10-CM | POA: Diagnosis not present

## 2015-03-07 DIAGNOSIS — K219 Gastro-esophageal reflux disease without esophagitis: Secondary | ICD-10-CM | POA: Insufficient documentation

## 2015-03-07 DIAGNOSIS — Z9889 Other specified postprocedural states: Secondary | ICD-10-CM | POA: Diagnosis not present

## 2015-03-07 DIAGNOSIS — M109 Gout, unspecified: Secondary | ICD-10-CM | POA: Diagnosis not present

## 2015-03-07 DIAGNOSIS — Z8619 Personal history of other infectious and parasitic diseases: Secondary | ICD-10-CM | POA: Diagnosis not present

## 2015-03-07 DIAGNOSIS — Z87891 Personal history of nicotine dependence: Secondary | ICD-10-CM | POA: Diagnosis not present

## 2015-03-07 DIAGNOSIS — R52 Pain, unspecified: Secondary | ICD-10-CM

## 2015-03-07 DIAGNOSIS — R1013 Epigastric pain: Secondary | ICD-10-CM | POA: Diagnosis present

## 2015-03-07 LAB — COMPREHENSIVE METABOLIC PANEL
ALBUMIN: 2.7 g/dL — AB (ref 3.5–5.0)
ALT: 45 U/L (ref 17–63)
AST: 55 U/L — AB (ref 15–41)
Alkaline Phosphatase: 193 U/L — ABNORMAL HIGH (ref 38–126)
Anion gap: 10 (ref 5–15)
BILIRUBIN TOTAL: 2.6 mg/dL — AB (ref 0.3–1.2)
BUN: 15 mg/dL (ref 6–20)
CO2: 22 mmol/L (ref 22–32)
Calcium: 8.6 mg/dL — ABNORMAL LOW (ref 8.9–10.3)
Chloride: 105 mmol/L (ref 101–111)
Creatinine, Ser: 1.75 mg/dL — ABNORMAL HIGH (ref 0.61–1.24)
GFR calc Af Amer: 42 mL/min — ABNORMAL LOW (ref >60)
GFR calc non Af Amer: 36 mL/min — ABNORMAL LOW (ref >60)
GLUCOSE: 130 mg/dL — AB (ref 65–99)
POTASSIUM: 4.3 mmol/L (ref 3.5–5.1)
Sodium: 137 mmol/L (ref 135–145)
TOTAL PROTEIN: 7 g/dL (ref 6.5–8.1)

## 2015-03-07 LAB — URINALYSIS, ROUTINE W REFLEX MICROSCOPIC
BILIRUBIN URINE: NEGATIVE
GLUCOSE, UA: 100 mg/dL — AB
HGB URINE DIPSTICK: NEGATIVE
KETONES UR: NEGATIVE mg/dL
Leukocytes, UA: NEGATIVE
NITRITE: NEGATIVE
PH: 5.5 (ref 5.0–8.0)
Protein, ur: NEGATIVE mg/dL
Specific Gravity, Urine: 1.01 (ref 1.005–1.030)

## 2015-03-07 LAB — CBC
HEMATOCRIT: 44.4 % (ref 39.0–52.0)
Hemoglobin: 14.6 g/dL (ref 13.0–17.0)
MCH: 32.2 pg (ref 26.0–34.0)
MCHC: 32.9 g/dL (ref 30.0–36.0)
MCV: 97.8 fL (ref 78.0–100.0)
Platelets: 167 10*3/uL (ref 150–400)
RBC: 4.54 MIL/uL (ref 4.22–5.81)
RDW: 16.2 % — AB (ref 11.5–15.5)
WBC: 6.7 10*3/uL (ref 4.0–10.5)

## 2015-03-07 LAB — LIPASE, BLOOD: Lipase: 19 U/L (ref 11–51)

## 2015-03-07 MED ORDER — SODIUM CHLORIDE 0.9 % IV BOLUS (SEPSIS)
500.0000 mL | Freq: Once | INTRAVENOUS | Status: AC
  Administered 2015-03-07: 500 mL via INTRAVENOUS

## 2015-03-07 MED ORDER — PREDNISONE 20 MG PO TABS: 20.0000 mg | ORAL_TABLET | Freq: Every day | ORAL | Status: DC

## 2015-03-07 MED ORDER — FAMOTIDINE IN NACL 20-0.9 MG/50ML-% IV SOLN
20.0000 mg | Freq: Once | INTRAVENOUS | Status: AC
  Administered 2015-03-07: 20 mg via INTRAVENOUS
  Filled 2015-03-07: qty 50

## 2015-03-07 MED ORDER — MORPHINE SULFATE (PF) 4 MG/ML IV SOLN
4.0000 mg | Freq: Once | INTRAVENOUS | Status: AC
  Administered 2015-03-07: 4 mg via INTRAVENOUS
  Filled 2015-03-07: qty 1

## 2015-03-07 MED ORDER — DIPHENHYDRAMINE HCL 50 MG/ML IJ SOLN
25.0000 mg | Freq: Once | INTRAMUSCULAR | Status: AC
  Administered 2015-03-07: 25 mg via INTRAVENOUS
  Filled 2015-03-07: qty 1

## 2015-03-07 MED ORDER — ONDANSETRON HCL 4 MG/2ML IJ SOLN
4.0000 mg | Freq: Once | INTRAMUSCULAR | Status: AC
Start: 2015-03-07 — End: 2015-03-07
  Administered 2015-03-07: 4 mg via INTRAVENOUS
  Filled 2015-03-07: qty 2

## 2015-03-07 NOTE — ED Provider Notes (Signed)
CSN: [id number]     Arrival date & time 03/07/15  1525 History   First MD Initiated Contact with Patient 03/07/15 1655     Chief Complaint  Patient presents with  . Abdominal Pain     (Consider location/radiation/quality/duration/timing/severity/associated sxs/prior Treatment) HPI..... Patient with known history of pancreatic cancer diagnosed in August 2016 presents with right upper quadrant and epigastric pain for 24 hours. Status post CBD stent placement past Wednesday at [facility] to drain his gallbladder. No fever, sweats, chills. He is slightly jaundiced. But that is not new. He has finished his radiation and chemotherapy. He originally was evaluated by Dr. Laural Golden locally. Review systems positive for pruritic rash on his back and arms  Past Medical History  Diagnosis Date  . Hypertension   . Gout   . Barrett's esophagus     last EGD/Bx 12/11  . Helicobacter pylori gastritis 2000    s/p treatment  . Adenomatous colon polyp 2010  . GERD (gastroesophageal reflux disease)   . GASTRITIS 09/23/2008    Qualifier: Diagnosis of  By: Nicole Kindred LPN, Doris    . Cancer of pancreas, other site AUG 2016    CA 19-9 958.6  . Pancreatic cancer ([place])   . Pancreatic cancer [facility])    Past Surgical History  Procedure Laterality Date  . Esophagogastroduodenoscopy  2008    DUODENITIS & GASTRITIS 2o TO NSAIDS/ETOH  . Colonoscopy  2010    simple adenomas  . Neck surgery      DISC REPLACED  . Bladder surgery  [id number]  . Sinus exploration  2000  . Tonsillectomy      AS A CHILD  . Esophagogastroduodenoscopy  12/2009    short segment Barrett's, small hh, chronic gastritis  . Nasal septum surgery    . Esophagogastroduodenoscopy  01/04/2011    [id number] gastritis/Barrett's, possible  . Esophagogastroduodenoscopy N/A 11/30/2012    Dr. Oneida Alar- normal esophagus, empiric dilation d/t c/o dysphagia/?cervical web, stomach= mild non erosive gastritis, inflammation on bx, duodenum= no abnormalities in  the bulb and second portion of the duodenum. dilation at the gastroesphageal junction.  Azzie Almas dilation N/A 11/30/2012    Procedure: SAVORY DILATION;  Surgeon: Danie Binder, MD;  Location: [facility];  Service: Endoscopy;  Laterality: N/A;  Venia Minks dilation N/A 11/30/2012    Procedure: Venia Minks DILATION;  Surgeon: Danie Binder, MD;  Location: [facility];  Service: Endoscopy;  Laterality: N/A;  . 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 Colonoscopy N/A 07/28/2013    Dr. Oneida Alar: tubular adenoma, mild sigmoid colon diverticulosis, internal hemorhhoids   . Doppler echocardiography  2009  . Nm myoview ltd  2009  . Ercp N/A 09/14/2014    Dr. Laural Golden: sphincterotomy with 64 F plastic biliary stent placement   . Biliary stent placement N/A 09/14/2014    Procedure: BILIARY STENT PLACEMENT;  Surgeon: Rogene Houston, MD;  Location: [facility] ORS;  Service: Endoscopy;  Laterality: N/A;  . Sphincterotomy N/A 09/14/2014    Procedure: SPHINCTEROTOMY;  Surgeon: Rogene Houston, MD;  Location: [facility] ORS;  Service: Endoscopy;  Laterality: N/A;  . Esophagogastroduodenoscopy (egd) with propofol N/A 01/04/2015    Procedure: ESOPHAGOGASTRODUODENOSCOPY (EGD) WITH PROPOFOL;  Surgeon: Daneil Dolin, MD;  Location: [facility] ORS;  Service: Endoscopy;  Laterality: N/A;  . Ercp N/A 01/04/2015    Procedure: ATTEMPTED ENDOSCOPIC RETROGRADE CHOLANGIOPANCREATOGRAPHY (ERCP);  Surgeon: Daneil Dolin, MD;  Location: [facility] ORS;  Service: Endoscopy;  Laterality: N/A;  . Stents in bile ducts  Family History  Problem Relation Age of Onset  . Stomach cancer Father 51    deceased  . Colon cancer Neg Hx    Social History  Substance Use Topics  . Smoking status: Former Smoker -- 3.00 packs/day    Types: Cigarettes  . Smokeless tobacco: Never Used     Comment: quit age 64 - 1 pack weekly   . Alcohol Use: No     Comment: drinks about a pint of borboun per week    Review of Systems  All other systems reviewed and are  negative.     Allergies  Colestipol and Doxycycline  Home Medications   Prior to Admission medications   Medication Sig Start Date End Date Taking? Authorizing Provider  allopurinol (ZYLOPRIM) 100 MG tablet Take 100 mg by mouth daily.     Yes Historical Provider, MD  ciprofloxacin (CIPRO) 500 MG tablet Take 500 mg by mouth 2 (two) times daily. 6 day course starting on 03/02/15 03/02/15 03/08/15 Yes Historical Provider, MD  metroNIDAZOLE (FLAGYL) 500 MG tablet Take 500 mg by mouth 2 (two) times daily. For 6 days starting on 03/02/15 03/02/15 03/08/15 Yes Historical Provider, MD  ondansetron (ZOFRAN) 8 MG tablet Take 8 mg by mouth every 8 (eight) hours as needed for nausea or vomiting.  10/06/14  Yes Historical Provider, MD  pantoprazole (PROTONIX) 40 MG tablet Take 1 tablet by mouth daily. 01/07/15  Yes Historical Provider, MD  ranitidine (ZANTAC 150 MAXIMUM STRENGTH) 150 MG tablet Take 150 mg by mouth as needed for heartburn.   Yes Historical Provider, MD  traMADol (ULTRAM) 50 MG tablet Take 50 mg by mouth every 6 (six) hours as needed. 03/02/15  Yes Historical Provider, MD  atenolol (TENORMIN) 25 MG tablet Take 12.5 mg by mouth every other day.    Historical Provider, MD  HYDROcodone-acetaminophen (NORCO/VICODIN) 5-325 MG tablet Take 1-2 tablets by mouth every 6 (six) hours as needed. Patient not taking: Reported on 03/07/2015 02/19/15   Fredia Sorrow, MD  predniSONE (DELTASONE) 20 MG tablet Take 1 tablet (20 mg total) by mouth daily with breakfast. 2 tablets for 6 days, one tablet for 6 days 03/07/15   Nat Christen, MD   BP 124/77 mmHg  Pulse 93  Temp(Src) 97.6 F (36.4 C) (Oral)  Resp 24  Ht 5\' 9"  (1.753 m)  Wt 189 lb (85.73 kg)  BMI 27.90 kg/m2  SpO2 97% Physical Exam  Constitutional: He is oriented to person, place, and time. He appears well-developed and well-nourished.  NAD  HENT:  Head: Normocephalic and atraumatic.  Eyes: Conjunctivae and EOM are normal. Pupils are equal, round, and  reactive to light.  Neck: Normal range of motion. Neck supple.  Cardiovascular: Normal rate and regular rhythm.   Pulmonary/Chest: Effort normal and breath sounds normal.  Abdominal: Soft. Bowel sounds are normal.  Minimal right upper quadrant and epigastric tenderness  Musculoskeletal: Normal range of motion.  Neurological: He is alert and oriented to person, place, and time.  Skin:  Slight jaundicing.  Minimal erythematous papular excoriation on upper back and forearms.  Psychiatric: He has a normal mood and affect. His behavior is normal.  Nursing note and vitals reviewed.   ED Course  Procedures (including critical care time) Labs Review Labs Reviewed  COMPREHENSIVE METABOLIC PANEL - Abnormal; Notable for the following:    Glucose, Bld 130 (*)    Creatinine, Ser 1.75 (*)    Calcium 8.6 (*)    Albumin 2.7 (*)  AST 55 (*)    Alkaline Phosphatase 193 (*)    Total Bilirubin 2.6 (*)    GFR calc non Af Amer 36 (*)    GFR calc Af Amer 42 (*)    All other components within normal limits  CBC - Abnormal; Notable for the following:    RDW 16.2 (*)    All other components within normal limits  URINALYSIS, ROUTINE W REFLEX MICROSCOPIC (NOT AT [facility]) - Abnormal; Notable for the following:    Glucose, UA 100 (*)    All other components within normal limits  LIPASE, BLOOD    Imaging Review Ct Abdomen Pelvis Wo Contrast  03/07/2015  CLINICAL DATA:  History of pancreatic cancer. Acute onset of abdominal pain at 2 p.m. today. Status post placement of biliary stent 03/01/2014. EXAM: CT ABDOMEN AND PELVIS WITHOUT CONTRAST TECHNIQUE: Multidetector CT imaging of the abdomen and pelvis was performed following the standard protocol without IV contrast. COMPARISON:  CT abdomen and pelvis 01/02/2015 and 02/19/2015. FINDINGS: Innumerable punctate pulmonary nodules are seen throughout the lung bases, markedly increased in number and conspicuity since the comparison studies. Index 0.7 cm nodule on  the 01/02/2015 study in the right lower lobe today measures 1.1 cm on image 7 There is no pleural or pericardial effusion. Calcific coronary artery disease is noted. Stent in the common bile duct has been replaced since the most recent study. There is some pneumobilia. Intrahepatic biliary ductal dilatation is improved. A single low attenuating lesion the right hepatic lobe measuring 0.7 cm is unchanged. The spleen and adrenal glands appear normal. The pancreas is atrophic with a mass lesion in the pancreatic head, not notably changed since the most recent study. Scarring and atrophy of the left kidney is unchanged. A milder degree of right renal scarring is noted. The stomach, small and large bowel and appendix appear normal. No fluid collection is seen. There is no lymphadenopathy. No lytic or sclerotic bony lesion is noted. IMPRESSION: No acute abnormality or finding to explain the patient's symptoms. New common bile duct stent in place, biliary ductal dilatation seen on the most recent examination is improved. Marked progression of innumerable punctate pulmonary nodules, particularly since 01/02/2015, consistent with metastatic disease. Electronically Signed   By: Inge Rise M.D.   On: 03/07/2015 19:32   I have personally reviewed and evaluated these images and lab results as part of my medical decision-making.   EKG Interpretation None      MDM   Final diagnoses:  Malignant neoplasm of pancreas, unspecified location of malignancy ([place])    Liver functions, creatinine, lipase are stable from previous laboratory evaluation. CT abdomen pelvis shows no acute abnormalities. Common bile duct stent in place. Will prescribe small dose prednisone for his rash. He has follow-up at [facility], MD 03/07/15 2130

## 2015-03-07 NOTE — ED Notes (Signed)
Patient given discharge instruction, verbalized understand. Power port needle removed, band aid applied. Patient ambulatory out of the department.

## 2015-03-07 NOTE — Discharge Instructions (Signed)
CT scan showed no new findings. Follow-up with [facility] doctors. Prescription for prednisone.

## 2015-03-07 NOTE — ED Notes (Signed)
Pt comes in with abdominal pain. Pt has pancreatic cancer and got stents placed to drain his gall bladder last Thursday. Pt had sudden onset of pain within the last hour. Pt has completed chemo and radiation.

## 2015-03-08 DIAGNOSIS — C250 Malignant neoplasm of head of pancreas: Secondary | ICD-10-CM | POA: Diagnosis not present

## 2015-03-08 DIAGNOSIS — Z510 Encounter for antineoplastic radiation therapy: Secondary | ICD-10-CM | POA: Diagnosis not present

## 2015-03-08 DIAGNOSIS — R21 Rash and other nonspecific skin eruption: Secondary | ICD-10-CM | POA: Diagnosis not present

## 2015-03-15 DIAGNOSIS — C250 Malignant neoplasm of head of pancreas: Secondary | ICD-10-CM | POA: Diagnosis not present

## 2015-03-20 DIAGNOSIS — R17 Unspecified jaundice: Secondary | ICD-10-CM | POA: Diagnosis not present

## 2015-03-20 DIAGNOSIS — K269 Duodenal ulcer, unspecified as acute or chronic, without hemorrhage or perforation: Secondary | ICD-10-CM | POA: Diagnosis not present

## 2015-03-20 DIAGNOSIS — R7989 Other specified abnormal findings of blood chemistry: Secondary | ICD-10-CM | POA: Diagnosis not present

## 2015-03-20 DIAGNOSIS — C250 Malignant neoplasm of head of pancreas: Secondary | ICD-10-CM | POA: Diagnosis not present

## 2015-03-20 DIAGNOSIS — L299 Pruritus, unspecified: Secondary | ICD-10-CM | POA: Diagnosis not present

## 2015-03-20 DIAGNOSIS — Z923 Personal history of irradiation: Secondary | ICD-10-CM | POA: Diagnosis not present

## 2015-03-20 DIAGNOSIS — K838 Other specified diseases of biliary tract: Secondary | ICD-10-CM | POA: Diagnosis not present

## 2015-03-20 DIAGNOSIS — R918 Other nonspecific abnormal finding of lung field: Secondary | ICD-10-CM | POA: Diagnosis not present

## 2015-03-21 ENCOUNTER — Emergency Department (HOSPITAL_COMMUNITY): Payer: Medicare Other

## 2015-03-21 ENCOUNTER — Emergency Department (HOSPITAL_COMMUNITY)
Admission: EM | Admit: 2015-03-21 | Discharge: 2015-03-23 | Disposition: A | Discharge status: Home / Self Care | Payer: Medicare Other | Attending: Emergency Medicine | Admitting: Emergency Medicine

## 2015-03-21 DIAGNOSIS — S71101A Unspecified open wound, right thigh, initial encounter: Secondary | ICD-10-CM | POA: Diagnosis not present

## 2015-03-21 DIAGNOSIS — W3400XA Accidental discharge from unspecified firearms or gun, initial encounter: Secondary | ICD-10-CM | POA: Diagnosis not present

## 2015-03-21 DIAGNOSIS — Z79899 Other long term (current) drug therapy: Secondary | ICD-10-CM | POA: Diagnosis not present

## 2015-03-21 DIAGNOSIS — M79606 Pain in leg, unspecified: Secondary | ICD-10-CM | POA: Diagnosis not present

## 2015-03-21 DIAGNOSIS — I959 Hypotension, unspecified: Secondary | ICD-10-CM | POA: Diagnosis not present

## 2015-03-21 DIAGNOSIS — Y9289 Other specified places as the place of occurrence of the external cause: Secondary | ICD-10-CM | POA: Insufficient documentation

## 2015-03-21 DIAGNOSIS — Y998 Other external cause status: Secondary | ICD-10-CM | POA: Insufficient documentation

## 2015-03-21 DIAGNOSIS — Y9389 Activity, other specified: Secondary | ICD-10-CM | POA: Insufficient documentation

## 2015-03-21 LAB — TYPE AND SCREEN
ABO/RH(D): O POS
ANTIBODY SCREEN: NEGATIVE
UNIT DIVISION: 0
UNIT DIVISION: 0

## 2015-03-21 LAB — PREPARE FRESH FROZEN PLASMA
UNIT DIVISION: 0
Unit division: 0

## 2015-03-21 LAB — ABO/RH: ABO/RH(D): O POS

## 2015-03-21 MED ORDER — HYDROCODONE-ACETAMINOPHEN 5-325 MG PO TABS
1.0000 | ORAL_TABLET | Freq: Once | ORAL | Status: AC
  Administered 2015-03-21: 1 via ORAL
  Filled 2015-03-21: qty 1

## 2015-03-21 MED ORDER — CEFAZOLIN SODIUM 1-5 GM-% IV SOLN: 1.0000 g | Freq: Once | INTRAVENOUS | Status: DC
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
## 2015-03-22 LAB — BLOOD PRODUCT ORDER (VERBAL) VERIFICATION

## 2015-03-27 DIAGNOSIS — R933 Abnormal findings on diagnostic imaging of other parts of digestive tract: Secondary | ICD-10-CM | POA: Diagnosis not present

## 2015-03-27 DIAGNOSIS — C250 Malignant neoplasm of head of pancreas: Secondary | ICD-10-CM | POA: Diagnosis not present

## 2015-03-27 DIAGNOSIS — R918 Other nonspecific abnormal finding of lung field: Secondary | ICD-10-CM | POA: Diagnosis not present

## 2015-03-30 DIAGNOSIS — Z923 Personal history of irradiation: Secondary | ICD-10-CM | POA: Diagnosis not present

## 2015-03-30 DIAGNOSIS — C250 Malignant neoplasm of head of pancreas: Secondary | ICD-10-CM | POA: Diagnosis not present

## 2015-04-04 DIAGNOSIS — Z86010 Personal history of colonic polyps: Secondary | ICD-10-CM | POA: Diagnosis not present

## 2015-04-04 DIAGNOSIS — R634 Abnormal weight loss: Secondary | ICD-10-CM | POA: Diagnosis not present

## 2015-04-04 DIAGNOSIS — Z9221 Personal history of antineoplastic chemotherapy: Secondary | ICD-10-CM | POA: Diagnosis not present

## 2015-04-04 DIAGNOSIS — R1013 Epigastric pain: Secondary | ICD-10-CM | POA: Diagnosis not present

## 2015-04-04 DIAGNOSIS — Z923 Personal history of irradiation: Secondary | ICD-10-CM | POA: Diagnosis not present

## 2015-04-04 DIAGNOSIS — H1589 Other disorders of sclera: Secondary | ICD-10-CM | POA: Diagnosis not present

## 2015-04-04 DIAGNOSIS — Z6828 Body mass index (BMI) 28.0-28.9, adult: Secondary | ICD-10-CM | POA: Diagnosis not present

## 2015-04-04 DIAGNOSIS — R918 Other nonspecific abnormal finding of lung field: Secondary | ICD-10-CM | POA: Diagnosis not present

## 2015-04-04 DIAGNOSIS — Z885 Allergy status to narcotic agent status: Secondary | ICD-10-CM | POA: Diagnosis not present

## 2015-04-04 DIAGNOSIS — I10 Essential (primary) hypertension: Secondary | ICD-10-CM | POA: Diagnosis not present

## 2015-04-04 DIAGNOSIS — C250 Malignant neoplasm of head of pancreas: Secondary | ICD-10-CM | POA: Diagnosis not present

## 2015-04-17 DIAGNOSIS — Z881 Allergy status to other antibiotic agents status: Secondary | ICD-10-CM | POA: Diagnosis not present

## 2015-04-17 DIAGNOSIS — K831 Obstruction of bile duct: Secondary | ICD-10-CM | POA: Diagnosis not present

## 2015-04-17 DIAGNOSIS — R109 Unspecified abdominal pain: Secondary | ICD-10-CM | POA: Diagnosis not present

## 2015-04-17 DIAGNOSIS — Z886 Allergy status to analgesic agent status: Secondary | ICD-10-CM | POA: Diagnosis not present

## 2015-04-17 DIAGNOSIS — Z885 Allergy status to narcotic agent status: Secondary | ICD-10-CM | POA: Diagnosis not present

## 2015-04-17 DIAGNOSIS — N189 Chronic kidney disease, unspecified: Secondary | ICD-10-CM | POA: Diagnosis not present

## 2015-04-17 DIAGNOSIS — R918 Other nonspecific abnormal finding of lung field: Secondary | ICD-10-CM | POA: Diagnosis not present

## 2015-04-17 DIAGNOSIS — Z923 Personal history of irradiation: Secondary | ICD-10-CM | POA: Diagnosis not present

## 2015-04-17 DIAGNOSIS — C250 Malignant neoplasm of head of pancreas: Secondary | ICD-10-CM | POA: Diagnosis not present

## 2015-04-17 DIAGNOSIS — G893 Neoplasm related pain (acute) (chronic): Secondary | ICD-10-CM | POA: Diagnosis not present

## 2015-04-20 DIAGNOSIS — H02051 Trichiasis without entropian right upper eyelid: Secondary | ICD-10-CM | POA: Diagnosis not present

## 2015-04-24 DIAGNOSIS — C801 Malignant (primary) neoplasm, unspecified: Secondary | ICD-10-CM | POA: Diagnosis not present

## 2015-04-24 DIAGNOSIS — K831 Obstruction of bile duct: Secondary | ICD-10-CM | POA: Diagnosis not present

## 2015-04-24 DIAGNOSIS — C259 Malignant neoplasm of pancreas, unspecified: Secondary | ICD-10-CM | POA: Diagnosis not present

## 2015-04-25 DIAGNOSIS — C7800 Secondary malignant neoplasm of unspecified lung: Secondary | ICD-10-CM | POA: Diagnosis not present

## 2015-04-25 DIAGNOSIS — R918 Other nonspecific abnormal finding of lung field: Secondary | ICD-10-CM | POA: Diagnosis not present

## 2015-04-25 DIAGNOSIS — R911 Solitary pulmonary nodule: Secondary | ICD-10-CM | POA: Diagnosis not present

## 2015-04-25 DIAGNOSIS — C250 Malignant neoplasm of head of pancreas: Secondary | ICD-10-CM | POA: Diagnosis not present

## 2015-05-01 DIAGNOSIS — C250 Malignant neoplasm of head of pancreas: Secondary | ICD-10-CM | POA: Diagnosis not present

## 2015-05-01 DIAGNOSIS — Z5111 Encounter for antineoplastic chemotherapy: Secondary | ICD-10-CM | POA: Diagnosis not present

## 2015-05-14 ENCOUNTER — Encounter (HOSPITAL_COMMUNITY): Payer: Self-pay | Admitting: Emergency Medicine

## 2015-05-14 ENCOUNTER — Emergency Department (HOSPITAL_COMMUNITY)
Admission: EM | Admit: 2015-05-14 | Discharge: 2015-05-14 | Disposition: A | Discharge status: Home / Self Care | Payer: Medicare Other | Attending: Emergency Medicine | Admitting: Emergency Medicine

## 2015-05-14 DIAGNOSIS — R55 Syncope and collapse: Secondary | ICD-10-CM

## 2015-05-14 DIAGNOSIS — I10 Essential (primary) hypertension: Secondary | ICD-10-CM | POA: Diagnosis not present

## 2015-05-14 DIAGNOSIS — Z8507 Personal history of malignant neoplasm of pancreas: Secondary | ICD-10-CM | POA: Diagnosis not present

## 2015-05-14 DIAGNOSIS — D649 Anemia, unspecified: Secondary | ICD-10-CM | POA: Insufficient documentation

## 2015-05-14 DIAGNOSIS — Z87891 Personal history of nicotine dependence: Secondary | ICD-10-CM | POA: Diagnosis not present

## 2015-05-14 DIAGNOSIS — R404 Transient alteration of awareness: Secondary | ICD-10-CM | POA: Diagnosis not present

## 2015-05-14 DIAGNOSIS — Z79899 Other long term (current) drug therapy: Secondary | ICD-10-CM | POA: Insufficient documentation

## 2015-05-14 LAB — BASIC METABOLIC PANEL
Anion gap: 10 (ref 5–15)
BUN: 24 mg/dL — AB (ref 6–20)
CO2: 21 mmol/L — ABNORMAL LOW (ref 22–32)
CREATININE: 1.93 mg/dL — AB (ref 0.61–1.24)
Calcium: 9.2 mg/dL (ref 8.9–10.3)
Chloride: 106 mmol/L (ref 101–111)
GFR calc Af Amer: 37 mL/min — ABNORMAL LOW (ref >60)
GFR, EST NON AFRICAN AMERICAN: 32 mL/min — AB (ref >60)
Glucose, Bld: 161 mg/dL — ABNORMAL HIGH (ref 65–99)
Potassium: 4.2 mmol/L (ref 3.5–5.1)
SODIUM: 137 mmol/L (ref 135–145)

## 2015-05-14 LAB — CBC
HCT: 31.3 % — ABNORMAL LOW (ref 39.0–52.0)
Hemoglobin: 10.8 g/dL — ABNORMAL LOW (ref 13.0–17.0)
MCH: 31.8 pg (ref 26.0–34.0)
MCHC: 34.5 g/dL (ref 30.0–36.0)
MCV: 92.1 fL (ref 78.0–100.0)
PLATELETS: 166 10*3/uL (ref 150–400)
RBC: 3.4 MIL/uL — ABNORMAL LOW (ref 4.22–5.81)
RDW: 14.7 % (ref 11.5–15.5)
WBC: 6.5 10*3/uL (ref 4.0–10.5)

## 2015-05-14 MED ORDER — SODIUM CHLORIDE 0.9 % IV BOLUS (SEPSIS)
1000.0000 mL | Freq: Once | INTRAVENOUS | Status: AC
  Administered 2015-05-14: 1000 mL via INTRAVENOUS

## 2015-05-14 NOTE — ED Notes (Signed)
Pulse ox will not read on patient continuously.  Spot check is 100%.

## 2015-05-14 NOTE — ED Notes (Signed)
Lunch tray ordered for patient.

## 2015-05-14 NOTE — Discharge Instructions (Signed)
You are slightly anemic. Hemoglobin was 10.8. Eat iron rich foods. Increase fluids. Follow-up your doctor tomorrow.

## 2015-05-14 NOTE — ED Provider Notes (Addendum)
CSN: [id number]     Arrival date & time 05/14/15  1124 History  By signing my name below, I, Doran Stabler, attest that this documentation has been prepared under the direction and in the presence of No att. providers found. Electronically Signed: Doran Stabler, ED Scribe. 05/14/2015. 1:03 PM.   Chief Complaint  Patient presents with  . Near Syncope   The history is provided by the patient. No language interpreter was used.   HPI Comments: William Rivera is a 80 y.o. male who presents to the Emergency Department with a PMHx of HTN and pancreatic cancer complaining of near syncopal episode while at church today. Pt states the feeling of possibly passing out lasted for 2 hours. He reports 2 syncopal episodes in the past two weeks. Pt denies any CP, dyspnea, fevers, chills, diaphoresis, dysuria, or any other symptoms at this time.   He is on chemotherapy for pancreatic cancer which was dx 08/2014. He receives his chemotherapy at [facility].   Past Medical History  Diagnosis Date  . Hypertension   . Gout   . Barrett's esophagus     last EGD/Bx 12/11  . Helicobacter pylori gastritis 2000    s/p treatment  . Adenomatous colon polyp 2010  . GERD (gastroesophageal reflux disease)   . GASTRITIS 09/23/2008    Qualifier: Diagnosis of  By: Nicole Kindred LPN, Doris    . Cancer of pancreas, other site AUG 2016    CA 19-9 958.6  . Pancreatic cancer ([place])   . Pancreatic cancer [facility])    Past Surgical History  Procedure Laterality Date  . Esophagogastroduodenoscopy  2008    DUODENITIS & GASTRITIS 2o TO NSAIDS/ETOH  . Colonoscopy  2010    simple adenomas  . Neck surgery      DISC REPLACED  . Bladder surgery  [id number]  . Sinus exploration  2000  . Tonsillectomy      AS A CHILD  . Esophagogastroduodenoscopy  12/2009    short segment Barrett's, small hh, chronic gastritis  . Nasal septum surgery    . Esophagogastroduodenoscopy  01/04/2011    [id number] gastritis/Barrett's, possible  .  Esophagogastroduodenoscopy N/A 11/30/2012    Dr. Oneida Alar- normal esophagus, empiric dilation d/t c/o dysphagia/?cervical web, stomach= mild non erosive gastritis, inflammation on bx, duodenum= no abnormalities in the bulb and second portion of the duodenum. dilation at the gastroesphageal junction.  Azzie Almas dilation N/A 11/30/2012    Procedure: SAVORY DILATION;  Surgeon: Danie Binder, MD;  [facility];  Service: Endoscopy;  Laterality: N/A;  Venia Minks dilation N/A 11/30/2012    Procedure: Venia Minks DILATION;  Surgeon: Danie Binder, MD;  [facility];  Service: Endoscopy;  Laterality: N/A;  . 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 Colonoscopy N/A 07/28/2013    Dr. Oneida Alar: tubular adenoma, mild sigmoid colon diverticulosis, internal hemorhhoids   . Doppler echocardiography  2009  . Nm myoview ltd  2009  . Ercp N/A 09/14/2014    Dr. Laural Golden: sphincterotomy with 74 F plastic biliary stent placement   . Biliary stent placement N/A 09/14/2014    Procedure: BILIARY STENT PLACEMENT;  Surgeon: Rogene Houston, MD;  Location: [facility] ORS;  Service: Endoscopy;  Laterality: N/A;  . Sphincterotomy N/A 09/14/2014    Procedure: SPHINCTEROTOMY;  Surgeon: Rogene Houston, MD;  Location: [facility] ORS;  Service: Endoscopy;  Laterality: N/A;  . Esophagogastroduodenoscopy (egd) with propofol N/A 01/04/2015    Procedure: ESOPHAGOGASTRODUODENOSCOPY (EGD) WITH PROPOFOL;  Surgeon: Cristopher Estimable  Rourk, MD;  Location: [facility] ORS;  Service: Endoscopy;  Laterality: N/A;  . Ercp N/A 01/04/2015    Procedure: ATTEMPTED ENDOSCOPIC RETROGRADE CHOLANGIOPANCREATOGRAPHY (ERCP);  Surgeon: Daneil Dolin, MD;  Location: [facility] ORS;  Service: Endoscopy;  Laterality: N/A;  . Stents in bile ducts     Family History  Problem Relation Age of Onset  . Stomach cancer Father 2    deceased  . Colon cancer Neg Hx    Social History  Substance Use Topics  . Smoking status: Former Smoker -- 3.00 packs/day    Types: Cigarettes  .  Smokeless tobacco: Never Used     Comment: quit age 78 - 1 pack weekly   . Alcohol Use: No     Comment: former drinks about a pint of borboun per week    Review of Systems  Constitutional: Negative for fever, chills and diaphoresis.  Respiratory: Negative for shortness of breath.   Cardiovascular: Negative for chest pain.  Genitourinary: Negative for dysuria.  All other systems reviewed and are negative.   Allergies  Doxycycline; Colestipol; and Doxycycline  Home Medications   Prior to Admission medications   Medication Sig Start Date End Date Taking? Authorizing Provider  HYDROcodone-acetaminophen (NORCO/VICODIN) 5-325 MG tablet Take 1 tablet by mouth every 6 (six) hours as needed for moderate pain.    Yes Historical Provider, MD  hydrOXYzine (ATARAX/VISTARIL) 25 MG tablet Take 25 mg by mouth 3 (three) times daily as needed for itching.   Yes Historical Provider, MD  morphine (MS CONTIN) 15 MG 12 hr tablet Take 1 tablet by mouth 3 (three) times daily as needed for pain.  04/17/15  Yes Historical Provider, MD  ondansetron (ZOFRAN) 8 MG tablet Take 8 mg by mouth every 8 (eight) hours as needed for nausea or vomiting.  10/06/14  Yes Historical Provider, MD  oxyCODONE (OXY IR/ROXICODONE) 5 MG immediate release tablet Take 1 tablet by mouth every 4 (four) hours as needed for moderate pain or severe pain.  04/04/15  Yes Historical Provider, MD  pantoprazole (PROTONIX) 40 MG tablet Take 40 mg by mouth daily.   Yes Historical Provider, MD  ranitidine (ZANTAC 150 MAXIMUM STRENGTH) 150 MG tablet Take 150 mg by mouth as needed for heartburn.   Yes Historical Provider, MD  sucralfate (CARAFATE) 1 g tablet Take 1 g by mouth 4 (four) times daily -  with meals and at bedtime.   Yes Historical Provider, MD  predniSONE (DELTASONE) 20 MG tablet Take 1 tablet (20 mg total) by mouth daily with breakfast. 2 tablets for 6 days, one tablet for 6 days Patient not taking: Reported on 05/14/2015 03/07/15   Nat Christen, MD   BP 102/60 mmHg  Pulse 88  Temp(Src) 97.8 F (36.6 C) (Oral)  Resp 18  Ht 5\' 9"  (1.753 m)  Wt 170 lb (77.111 kg)  BMI 25.09 kg/m2  SpO2 100%   Physical Exam  Constitutional: He is oriented to person, place, and time. He appears well-developed and well-nourished.  HENT:  Head: Normocephalic and atraumatic.  Eyes: Conjunctivae and EOM are normal. Pupils are equal, round, and reactive to light.  Neck: Normal range of motion. Neck supple.  Cardiovascular: Normal rate and regular rhythm.   Pulmonary/Chest: Effort normal and breath sounds normal.  Abdominal: Soft. Bowel sounds are normal.  Musculoskeletal: Normal range of motion.  Neurological: He is alert and oriented to person, place, and time.  Skin: Skin is warm and dry.  Psychiatric: He has a normal mood  and affect. His behavior is normal.  Nursing note and vitals reviewed.   ED Course  Procedures  DIAGNOSTIC STUDIES: Oxygen Saturation is 100% on room air, normal by my interpretation.    COORDINATION OF CARE: 1:02 PM Will give fluids. Will order EKG and blood works. Discussed treatment plan with pt at bedside and pt agreed to plan.  Labs Review Labs Reviewed  BASIC METABOLIC PANEL - Abnormal; Notable for the following:    CO2 21 (*)    Glucose, Bld 161 (*)    BUN 24 (*)    Creatinine, Ser 1.93 (*)    GFR calc non Af Amer 32 (*)    GFR calc Af Amer 37 (*)    All other components within normal limits  CBC - Abnormal; Notable for the following:    RBC 3.40 (*)    Hemoglobin 10.8 (*)    HCT 31.3 (*)    All other components within normal limits    Imaging Review No results found. I have personally reviewed and evaluated these images and lab results as part of my medical decision-making.   EKG Interpretation   Date/Time:  Sunday May 14 2015 11:24:30 EDT Ventricular Rate:  110 PR Interval:  177 QRS Duration: 73 QT Interval:  332 QTC Calculation: 449 R Axis:   26 Text Interpretation:  Sinus  tachycardia No significant change since last  tracing Confirmed by Ashok Cordia  MD, Lennette Bihari ([id number]) on 05/14/2015 11:26:28 AM  Also confirmed by Lacinda Axon  MD, Siena Poehler ([id number])  on 05/14/2015 1:56:43 PM      MDM   Final diagnoses:  Near syncope  Anemia, unspecified anemia type    Patient is alert and oriented without neurological deficits. Blood pressure is slightly soft. Patient did not eat or drink today. Hemoglobin has dropped to 10.8. Creatinine remains elevated, but essentially unchanged. Patient will be discharged home. He has close follow-up.  Test results discussed with patient and his wife   I personally performed the services described in this documentation, which was scribed in my presence. The recorded information has been reviewed and is accurate.     Nat Christen, MD 05/14/15 [place], MD 05/14/15 [phone]

## 2015-05-14 NOTE — ED Notes (Signed)
Pt states he has been experiencing syncopal episodes for over a week and today got dizzy and thought he was going to pass out at church.  States this has been an ongoing problem since his last round of chemo treatments for pancreatic cancer.  Pt denies any complaints currently.

## 2015-05-14 NOTE — ED Notes (Signed)
Pt c/o feeling generally weak and lightheaded. Pt has pancreatic ca and is currently being treat with chemotherapy. Last treatment 2 weeks ago.

## 2015-05-15 DIAGNOSIS — Z888 Allergy status to other drugs, medicaments and biological substances status: Secondary | ICD-10-CM | POA: Diagnosis not present

## 2015-05-15 DIAGNOSIS — Z79891 Long term (current) use of opiate analgesic: Secondary | ICD-10-CM | POA: Diagnosis not present

## 2015-05-15 DIAGNOSIS — Z881 Allergy status to other antibiotic agents status: Secondary | ICD-10-CM | POA: Diagnosis not present

## 2015-05-15 DIAGNOSIS — G893 Neoplasm related pain (acute) (chronic): Secondary | ICD-10-CM | POA: Diagnosis not present

## 2015-05-15 DIAGNOSIS — I959 Hypotension, unspecified: Secondary | ICD-10-CM | POA: Diagnosis not present

## 2015-05-15 DIAGNOSIS — N189 Chronic kidney disease, unspecified: Secondary | ICD-10-CM | POA: Diagnosis not present

## 2015-05-15 DIAGNOSIS — Z886 Allergy status to analgesic agent status: Secondary | ICD-10-CM | POA: Diagnosis not present

## 2015-05-15 DIAGNOSIS — K831 Obstruction of bile duct: Secondary | ICD-10-CM | POA: Diagnosis not present

## 2015-05-15 DIAGNOSIS — C250 Malignant neoplasm of head of pancreas: Secondary | ICD-10-CM | POA: Diagnosis not present

## 2015-05-15 DIAGNOSIS — Z885 Allergy status to narcotic agent status: Secondary | ICD-10-CM | POA: Diagnosis not present

## 2015-05-15 DIAGNOSIS — Z923 Personal history of irradiation: Secondary | ICD-10-CM | POA: Diagnosis not present

## 2015-05-29 DIAGNOSIS — C7800 Secondary malignant neoplasm of unspecified lung: Secondary | ICD-10-CM | POA: Diagnosis not present

## 2015-05-29 DIAGNOSIS — G893 Neoplasm related pain (acute) (chronic): Secondary | ICD-10-CM | POA: Diagnosis not present

## 2015-05-29 DIAGNOSIS — C250 Malignant neoplasm of head of pancreas: Secondary | ICD-10-CM | POA: Diagnosis not present

## 2015-05-29 DIAGNOSIS — Z886 Allergy status to analgesic agent status: Secondary | ICD-10-CM | POA: Diagnosis not present

## 2015-05-29 DIAGNOSIS — Z881 Allergy status to other antibiotic agents status: Secondary | ICD-10-CM | POA: Diagnosis not present

## 2015-05-29 DIAGNOSIS — Z79899 Other long term (current) drug therapy: Secondary | ICD-10-CM | POA: Diagnosis not present

## 2015-05-29 DIAGNOSIS — C259 Malignant neoplasm of pancreas, unspecified: Secondary | ICD-10-CM | POA: Diagnosis not present

## 2015-05-29 DIAGNOSIS — I10 Essential (primary) hypertension: Secondary | ICD-10-CM | POA: Diagnosis not present

## 2015-05-29 DIAGNOSIS — Z885 Allergy status to narcotic agent status: Secondary | ICD-10-CM | POA: Diagnosis not present

## 2015-06-01 DIAGNOSIS — R1013 Epigastric pain: Secondary | ICD-10-CM | POA: Diagnosis not present

## 2015-06-01 DIAGNOSIS — C259 Malignant neoplasm of pancreas, unspecified: Secondary | ICD-10-CM | POA: Diagnosis not present

## 2015-06-01 DIAGNOSIS — D649 Anemia, unspecified: Secondary | ICD-10-CM | POA: Diagnosis not present

## 2015-06-01 DIAGNOSIS — C7800 Secondary malignant neoplasm of unspecified lung: Secondary | ICD-10-CM | POA: Diagnosis not present

## 2015-06-01 DIAGNOSIS — R079 Chest pain, unspecified: Secondary | ICD-10-CM | POA: Diagnosis not present

## 2015-06-01 DIAGNOSIS — R1909 Other intra-abdominal and pelvic swelling, mass and lump: Secondary | ICD-10-CM | POA: Diagnosis not present

## 2015-06-02 ENCOUNTER — Encounter (HOSPITAL_COMMUNITY)
Admission: EM | Disposition: A | Discharge status: Home / Self Care | Payer: Self-pay | Source: Home / Self Care | Attending: Internal Medicine

## 2015-06-02 ENCOUNTER — Inpatient Hospital Stay (HOSPITAL_COMMUNITY)
Admission: EM | Admit: 2015-06-02 | Discharge: 2015-06-06 | DRG: 377 | Disposition: A | Discharge status: Home / Self Care | Payer: Medicare Other | Attending: Internal Medicine | Admitting: Internal Medicine

## 2015-06-02 ENCOUNTER — Encounter (HOSPITAL_COMMUNITY): Payer: Self-pay

## 2015-06-02 DIAGNOSIS — N289 Disorder of kidney and ureter, unspecified: Secondary | ICD-10-CM | POA: Diagnosis not present

## 2015-06-02 DIAGNOSIS — M109 Gout, unspecified: Secondary | ICD-10-CM | POA: Diagnosis present

## 2015-06-02 DIAGNOSIS — Z66 Do not resuscitate: Secondary | ICD-10-CM | POA: Diagnosis not present

## 2015-06-02 DIAGNOSIS — Z923 Personal history of irradiation: Secondary | ICD-10-CM

## 2015-06-02 DIAGNOSIS — Z883 Allergy status to other anti-infective agents status: Secondary | ICD-10-CM | POA: Diagnosis not present

## 2015-06-02 DIAGNOSIS — K264 Chronic or unspecified duodenal ulcer with hemorrhage: Secondary | ICD-10-CM | POA: Diagnosis not present

## 2015-06-02 DIAGNOSIS — Z6824 Body mass index (BMI) 24.0-24.9, adult: Secondary | ICD-10-CM

## 2015-06-02 DIAGNOSIS — Z79899 Other long term (current) drug therapy: Secondary | ICD-10-CM | POA: Diagnosis not present

## 2015-06-02 DIAGNOSIS — R1013 Epigastric pain: Secondary | ICD-10-CM | POA: Diagnosis not present

## 2015-06-02 DIAGNOSIS — E43 Unspecified severe protein-calorie malnutrition: Secondary | ICD-10-CM | POA: Diagnosis not present

## 2015-06-02 DIAGNOSIS — I959 Hypotension, unspecified: Secondary | ICD-10-CM | POA: Diagnosis not present

## 2015-06-02 DIAGNOSIS — Z800 Family history of malignant neoplasm of digestive organs: Secondary | ICD-10-CM

## 2015-06-02 DIAGNOSIS — K922 Gastrointestinal hemorrhage, unspecified: Secondary | ICD-10-CM | POA: Diagnosis not present

## 2015-06-02 DIAGNOSIS — K921 Melena: Secondary | ICD-10-CM | POA: Insufficient documentation

## 2015-06-02 DIAGNOSIS — N183 Chronic kidney disease, stage 3 (moderate): Secondary | ICD-10-CM | POA: Diagnosis present

## 2015-06-02 DIAGNOSIS — D62 Acute posthemorrhagic anemia: Secondary | ICD-10-CM

## 2015-06-02 DIAGNOSIS — K8681 Exocrine pancreatic insufficiency: Secondary | ICD-10-CM | POA: Diagnosis present

## 2015-06-02 DIAGNOSIS — K2270 Barrett's esophagus without dysplasia: Secondary | ICD-10-CM | POA: Diagnosis present

## 2015-06-02 DIAGNOSIS — Z9689 Presence of other specified functional implants: Secondary | ICD-10-CM | POA: Diagnosis present

## 2015-06-02 DIAGNOSIS — Z7952 Long term (current) use of systemic steroids: Secondary | ICD-10-CM | POA: Diagnosis not present

## 2015-06-02 DIAGNOSIS — Z515 Encounter for palliative care: Secondary | ICD-10-CM | POA: Diagnosis not present

## 2015-06-02 DIAGNOSIS — Z888 Allergy status to other drugs, medicaments and biological substances status: Secondary | ICD-10-CM

## 2015-06-02 DIAGNOSIS — R000 Tachycardia, unspecified: Secondary | ICD-10-CM | POA: Diagnosis not present

## 2015-06-02 DIAGNOSIS — I129 Hypertensive chronic kidney disease with stage 1 through stage 4 chronic kidney disease, or unspecified chronic kidney disease: Secondary | ICD-10-CM | POA: Diagnosis present

## 2015-06-02 DIAGNOSIS — Z79891 Long term (current) use of opiate analgesic: Secondary | ICD-10-CM

## 2015-06-02 DIAGNOSIS — C259 Malignant neoplasm of pancreas, unspecified: Secondary | ICD-10-CM | POA: Diagnosis present

## 2015-06-02 DIAGNOSIS — K2970 Gastritis, unspecified, without bleeding: Secondary | ICD-10-CM | POA: Diagnosis present

## 2015-06-02 DIAGNOSIS — K2901 Acute gastritis with bleeding: Secondary | ICD-10-CM | POA: Diagnosis not present

## 2015-06-02 DIAGNOSIS — Z9221 Personal history of antineoplastic chemotherapy: Secondary | ICD-10-CM

## 2015-06-02 DIAGNOSIS — K5730 Diverticulosis of large intestine without perforation or abscess without bleeding: Secondary | ICD-10-CM | POA: Diagnosis present

## 2015-06-02 DIAGNOSIS — C7800 Secondary malignant neoplasm of unspecified lung: Secondary | ICD-10-CM | POA: Diagnosis present

## 2015-06-02 DIAGNOSIS — R1084 Generalized abdominal pain: Secondary | ICD-10-CM | POA: Diagnosis not present

## 2015-06-02 DIAGNOSIS — N179 Acute kidney failure, unspecified: Secondary | ICD-10-CM | POA: Diagnosis not present

## 2015-06-02 DIAGNOSIS — Z881 Allergy status to other antibiotic agents status: Secondary | ICD-10-CM | POA: Diagnosis not present

## 2015-06-02 DIAGNOSIS — R55 Syncope and collapse: Secondary | ICD-10-CM | POA: Insufficient documentation

## 2015-06-02 DIAGNOSIS — C250 Malignant neoplasm of head of pancreas: Secondary | ICD-10-CM

## 2015-06-02 DIAGNOSIS — I4891 Unspecified atrial fibrillation: Secondary | ICD-10-CM | POA: Diagnosis not present

## 2015-06-02 DIAGNOSIS — D649 Anemia, unspecified: Secondary | ICD-10-CM | POA: Diagnosis not present

## 2015-06-02 DIAGNOSIS — Z87891 Personal history of nicotine dependence: Secondary | ICD-10-CM

## 2015-06-02 DIAGNOSIS — K315 Obstruction of duodenum: Secondary | ICD-10-CM | POA: Insufficient documentation

## 2015-06-02 DIAGNOSIS — I471 Supraventricular tachycardia: Secondary | ICD-10-CM | POA: Diagnosis not present

## 2015-06-02 DIAGNOSIS — K260 Acute duodenal ulcer with hemorrhage: Secondary | ICD-10-CM | POA: Diagnosis not present

## 2015-06-02 HISTORY — PX: ESOPHAGOGASTRODUODENOSCOPY: SHX5428

## 2015-06-02 LAB — HEMOGLOBIN AND HEMATOCRIT, BLOOD
HEMATOCRIT: 26.3 % — AB (ref 39.0–52.0)
Hemoglobin: 9 g/dL — ABNORMAL LOW (ref 13.0–17.0)

## 2015-06-02 LAB — CBC WITH DIFFERENTIAL/PLATELET
Basophils Absolute: 0 10*3/uL (ref 0.0–0.1)
Basophils Relative: 0 %
EOS ABS: 0 10*3/uL (ref 0.0–0.7)
Eosinophils Relative: 0 %
HCT: 25.8 % — ABNORMAL LOW (ref 39.0–52.0)
HEMOGLOBIN: 8.7 g/dL — AB (ref 13.0–17.0)
LYMPHS ABS: 1.1 10*3/uL (ref 0.7–4.0)
Lymphocytes Relative: 11 %
MCH: 31.8 pg (ref 26.0–34.0)
MCHC: 33.7 g/dL (ref 30.0–36.0)
MCV: 94.2 fL (ref 78.0–100.0)
MONOS PCT: 7 %
Monocytes Absolute: 0.7 10*3/uL (ref 0.1–1.0)
NEUTROS PCT: 82 %
Neutro Abs: 7.8 10*3/uL — ABNORMAL HIGH (ref 1.7–7.7)
Platelets: 305 10*3/uL (ref 150–400)
RBC: 2.74 MIL/uL — ABNORMAL LOW (ref 4.22–5.81)
RDW: 14.9 % (ref 11.5–15.5)
WBC: 9.6 10*3/uL (ref 4.0–10.5)

## 2015-06-02 LAB — PREPARE RBC (CROSSMATCH)

## 2015-06-02 LAB — CBC
HCT: 20.4 % — ABNORMAL LOW (ref 39.0–52.0)
HEMOGLOBIN: 6.8 g/dL — AB (ref 13.0–17.0)
MCH: 31.2 pg (ref 26.0–34.0)
MCHC: 33.3 g/dL (ref 30.0–36.0)
MCV: 93.6 fL (ref 78.0–100.0)
Platelets: 213 10*3/uL (ref 150–400)
RBC: 2.18 MIL/uL — ABNORMAL LOW (ref 4.22–5.81)
RDW: 15 % (ref 11.5–15.5)
WBC: 7.7 10*3/uL (ref 4.0–10.5)

## 2015-06-02 LAB — COMPREHENSIVE METABOLIC PANEL
ALK PHOS: 90 U/L (ref 38–126)
ALT: 15 U/L — ABNORMAL LOW (ref 17–63)
ANION GAP: 5 (ref 5–15)
AST: 20 U/L (ref 15–41)
Albumin: 2.8 g/dL — ABNORMAL LOW (ref 3.5–5.0)
BUN: 21 mg/dL — ABNORMAL HIGH (ref 6–20)
CALCIUM: 8.1 mg/dL — AB (ref 8.9–10.3)
CO2: 25 mmol/L (ref 22–32)
Chloride: 105 mmol/L (ref 101–111)
Creatinine, Ser: 1.44 mg/dL — ABNORMAL HIGH (ref 0.61–1.24)
GFR, EST AFRICAN AMERICAN: 53 mL/min — AB (ref >60)
GFR, EST NON AFRICAN AMERICAN: 45 mL/min — AB (ref >60)
Glucose, Bld: 147 mg/dL — ABNORMAL HIGH (ref 65–99)
Potassium: 4.3 mmol/L (ref 3.5–5.1)
SODIUM: 135 mmol/L (ref 135–145)
TOTAL PROTEIN: 5.7 g/dL — AB (ref 6.5–8.1)
Total Bilirubin: 0.4 mg/dL (ref 0.3–1.2)

## 2015-06-02 LAB — PROTIME-INR
INR: 1.33 (ref 0.00–1.49)
PROTHROMBIN TIME: 16.6 s — AB (ref 11.6–15.2)

## 2015-06-02 LAB — APTT: APTT: 31 s (ref 24–37)

## 2015-06-02 LAB — POC OCCULT BLOOD, ED: FECAL OCCULT BLD: POSITIVE — AB

## 2015-06-02 LAB — LIPASE, BLOOD: LIPASE: 11 U/L (ref 11–51)

## 2015-06-02 LAB — TROPONIN I: Troponin I: <0.03 ng/mL (ref <0.031)

## 2015-06-02 LAB — ABO/RH: ABO/RH(D): O POS

## 2015-06-02 LAB — MRSA PCR SCREENING: MRSA BY PCR: NEGATIVE

## 2015-06-02 SURGERY — EGD (ESOPHAGOGASTRODUODENOSCOPY)
Anesthesia: Moderate Sedation

## 2015-06-02 MED ORDER — STERILE WATER FOR IRRIGATION IR SOLN
Status: DC | PRN
  Administered 2015-06-02: 16:00:00

## 2015-06-02 MED ORDER — ADULT MULTIVITAMIN W/MINERALS CH
1.0000 | ORAL_TABLET | Freq: Every day | ORAL | Status: DC
  Administered 2015-06-05: 1 via ORAL
  Filled 2015-06-02: qty 1

## 2015-06-02 MED ORDER — SODIUM CHLORIDE 0.9 % IV SOLN
INTRAVENOUS | Status: DC
  Administered 2015-06-02: 21:00:00 via INTRAVENOUS

## 2015-06-02 MED ORDER — ACETAMINOPHEN 325 MG PO TABS: 650.0000 mg | ORAL_TABLET | Freq: Four times a day (QID) | ORAL | Status: DC | PRN

## 2015-06-02 MED ORDER — HYDROMORPHONE HCL 1 MG/ML IJ SOLN: 0.5000 mg | INTRAMUSCULAR | Status: DC | PRN

## 2015-06-02 MED ORDER — SODIUM CHLORIDE 0.9 % IV SOLN
INTRAVENOUS | Status: DC
  Administered 2015-06-02: 06:00:00 via INTRAVENOUS

## 2015-06-02 MED ORDER — SODIUM CHLORIDE 0.9 % IV SOLN: Freq: Once | INTRAVENOUS | Status: AC

## 2015-06-02 MED ORDER — MIDAZOLAM HCL 5 MG/5ML IJ SOLN
INTRAMUSCULAR | Status: AC
  Filled 2015-06-02: qty 10

## 2015-06-02 MED ORDER — SODIUM CHLORIDE 0.9 % IV SOLN: INTRAVENOUS | Status: DC

## 2015-06-02 MED ORDER — DIPHENHYDRAMINE HCL 25 MG PO CAPS
25.0000 mg | ORAL_CAPSULE | Freq: Once | ORAL | Status: AC
  Administered 2015-06-02: 25 mg via ORAL
  Filled 2015-06-02: qty 1

## 2015-06-02 MED ORDER — MIDAZOLAM HCL 5 MG/5ML IJ SOLN
INTRAMUSCULAR | Status: DC | PRN
  Administered 2015-06-02 (×2): 2 mg via INTRAVENOUS

## 2015-06-02 MED ORDER — PANTOPRAZOLE SODIUM 40 MG IV SOLR
INTRAVENOUS | Status: AC
  Filled 2015-06-02: qty 160

## 2015-06-02 MED ORDER — ONDANSETRON HCL 4 MG/2ML IJ SOLN
4.0000 mg | Freq: Once | INTRAMUSCULAR | Status: AC
  Administered 2015-06-02: 4 mg via INTRAVENOUS
  Filled 2015-06-02: qty 2

## 2015-06-02 MED ORDER — SODIUM CHLORIDE 0.9 % IV SOLN: 8.0000 mg/h | INTRAVENOUS | Status: DC

## 2015-06-02 MED ORDER — BOOST / RESOURCE BREEZE PO LIQD
1.0000 | Freq: Three times a day (TID) | ORAL | Status: DC
  Administered 2015-06-05: 1 via ORAL

## 2015-06-02 MED ORDER — OXYCODONE HCL 5 MG PO TABS
10.0000 mg | ORAL_TABLET | Freq: Four times a day (QID) | ORAL | Status: DC | PRN
  Administered 2015-06-02: 10 mg via ORAL
  Filled 2015-06-02: qty 2

## 2015-06-02 MED ORDER — SODIUM CHLORIDE 0.9% FLUSH
3.0000 mL | Freq: Two times a day (BID) | INTRAVENOUS | Status: DC
  Administered 2015-06-02 – 2015-06-06 (×9): 3 mL via INTRAVENOUS

## 2015-06-02 MED ORDER — SODIUM CHLORIDE 0.9 % IV SOLN
Freq: Once | INTRAVENOUS | Status: AC
  Administered 2015-06-02: 10:00:00 via INTRAVENOUS

## 2015-06-02 MED ORDER — MEPERIDINE HCL 100 MG/ML IJ SOLN
INTRAMUSCULAR | Status: AC
  Filled 2015-06-02: qty 2

## 2015-06-02 MED ORDER — PANTOPRAZOLE SODIUM 40 MG IV SOLR: 40.0000 mg | Freq: Two times a day (BID) | INTRAVENOUS | Status: DC

## 2015-06-02 MED ORDER — OXYCODONE HCL 5 MG PO TABS
5.0000 mg | ORAL_TABLET | ORAL | Status: DC | PRN
  Administered 2015-06-02 – 2015-06-05 (×3): 5 mg via ORAL
  Filled 2015-06-02 (×3): qty 1

## 2015-06-02 MED ORDER — ACETAMINOPHEN 650 MG RE SUPP: 650.0000 mg | Freq: Four times a day (QID) | RECTAL | Status: DC | PRN

## 2015-06-02 MED ORDER — MEPERIDINE HCL 100 MG/ML IJ SOLN
INTRAMUSCULAR | Status: DC | PRN
  Administered 2015-06-02: 50 mg via INTRAVENOUS
  Administered 2015-06-02: 25 mg via INTRAVENOUS

## 2015-06-02 MED ORDER — ACETAMINOPHEN 325 MG PO TABS
650.0000 mg | ORAL_TABLET | Freq: Once | ORAL | Status: AC
  Administered 2015-06-02: 650 mg via ORAL
  Filled 2015-06-02: qty 2

## 2015-06-02 MED ORDER — SODIUM CHLORIDE 0.9 % IV SOLN
80.0000 mg | Freq: Once | INTRAVENOUS | Status: AC
  Administered 2015-06-02: 80 mg via INTRAVENOUS
  Filled 2015-06-02: qty 80

## 2015-06-02 MED ORDER — LIDOCAINE VISCOUS 2 % MT SOLN
OROMUCOSAL | Status: DC | PRN
  Administered 2015-06-02: 1 via OROMUCOSAL

## 2015-06-02 MED ORDER — SODIUM CHLORIDE 0.9 % IV SOLN
8.0000 mg/h | INTRAVENOUS | Status: DC
  Administered 2015-06-02 (×2): 8 mg/h via INTRAVENOUS
  Filled 2015-06-02 (×10): qty 80

## 2015-06-02 MED ORDER — ONDANSETRON HCL 4 MG PO TABS: 8.0000 mg | ORAL_TABLET | Freq: Three times a day (TID) | ORAL | Status: DC | PRN

## 2015-06-02 MED ORDER — SODIUM CHLORIDE 0.9 % IV SOLN: 80.0000 mg | Freq: Once | INTRAVENOUS | Status: DC

## 2015-06-02 MED ORDER — SODIUM CHLORIDE 0.9 % IV BOLUS (SEPSIS)
1000.0000 mL | Freq: Once | INTRAVENOUS | Status: AC
  Administered 2015-06-02: 1000 mL via INTRAVENOUS

## 2015-06-02 MED ORDER — ONDANSETRON HCL 4 MG/2ML IJ SOLN
4.0000 mg | Freq: Four times a day (QID) | INTRAMUSCULAR | Status: DC | PRN
  Administered 2015-06-06: 4 mg via INTRAVENOUS
  Filled 2015-06-02: qty 2

## 2015-06-02 MED ORDER — FENTANYL CITRATE (PF) 100 MCG/2ML IJ SOLN
50.0000 ug | Freq: Once | INTRAMUSCULAR | Status: AC
  Administered 2015-06-02: 50 ug via INTRAVENOUS
  Filled 2015-06-02: qty 2

## 2015-06-02 MED ORDER — LIDOCAINE VISCOUS 2 % MT SOLN
OROMUCOSAL | Status: AC
  Filled 2015-06-02: qty 15

## 2015-06-02 NOTE — H&P (Signed)
Primary Care Physician:  [name], MD Primary Gastroenterologist:  Dr. Oneida Alar  Pre-Procedure History & Physical: HPI:  William Rivera is a 80 y.o. male here for RECTAL BLEEDING.  Past Medical History  Diagnosis Date  . Hypertension   . Gout   . Barrett's esophagus     last EGD/Bx 12/11  . Helicobacter pylori gastritis 2000    s/p treatment  . Adenomatous colon polyp 2010  . GERD (gastroesophageal reflux disease)   . GASTRITIS 09/23/2008    Qualifier: Diagnosis of  By: Nicole Kindred LPN, Doris    . Cancer of pancreas, other site AUG 2016    CA 19-9 958.6  . Pancreatic cancer ([place])   . Pancreatic cancer [facility])     Past Surgical History  Procedure Laterality Date  . Esophagogastroduodenoscopy  2008    DUODENITIS & GASTRITIS 2o TO NSAIDS/ETOH  . Colonoscopy  2010    simple adenomas  . Neck surgery      DISC REPLACED  . Bladder surgery  [id number]  . Sinus exploration  2000  . Tonsillectomy      AS A CHILD  . Esophagogastroduodenoscopy  12/2009    short segment Barrett's, small hh, chronic gastritis  . Nasal septum surgery    . Esophagogastroduodenoscopy  01/04/2011    [id number] gastritis/Barrett's, possible  . Esophagogastroduodenoscopy N/A 11/30/2012    Dr. Oneida Alar- normal esophagus, empiric dilation d/t c/o dysphagia/?cervical web, stomach= mild non erosive gastritis, inflammation on bx, duodenum= no abnormalities in the bulb and second portion of the duodenum. dilation at the gastroesphageal junction.  Azzie Almas dilation N/A 11/30/2012    Procedure: SAVORY DILATION;  Surgeon: Danie Binder, MD;  [facility];  Service: Endoscopy;  Laterality: N/A;  Venia Minks dilation N/A 11/30/2012    Procedure: Venia Minks DILATION;  Surgeon: Danie Binder, MD;  [facility];  Service: Endoscopy;  Laterality: N/A;  . 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 Colonoscopy N/A 07/28/2013    Dr. Oneida Alar: tubular adenoma, mild sigmoid colon diverticulosis, internal hemorhhoids    . Doppler echocardiography  2009  . Nm myoview ltd  2009  . Ercp N/A 09/14/2014    Dr. Laural Golden: sphincterotomy with 101 F plastic biliary stent placement   . Biliary stent placement N/A 09/14/2014    Procedure: BILIARY STENT PLACEMENT;  Surgeon: Rogene Houston, MD;  Location: [facility] ORS;  Service: Endoscopy;  Laterality: N/A;  . Sphincterotomy N/A 09/14/2014    Procedure: SPHINCTEROTOMY;  Surgeon: Rogene Houston, MD;  Location: [facility] ORS;  Service: Endoscopy;  Laterality: N/A;  . Esophagogastroduodenoscopy (egd) with propofol N/A 01/04/2015    RMR: partial uoddenal obstruction at junction of 1st and 2nd portion of duodenum, unale to intubate 2nd portion of duodenum to perform ERCP  . Ercp N/A 01/04/2015    Procedure: ATTEMPTED ENDOSCOPIC RETROGRADE CHOLANGIOPANCREATOGRAPHY (ERCP);  Surgeon: Daneil Dolin, MD;  Location: [facility] ORS;  Service: Endoscopy;  Laterality: N/A;  . Stents in bile ducts    . Hemorrhoid surgery    . Ercp  02/2015    [facility]: biliary stent exchange from plastic to metal stent    Prior to Admission medications   Medication Sig Start Date End Date Taking? Authorizing Provider  Artificial Tear Ointment (DRY EYES OP) Apply 1-2 drops to eye daily as needed (dry eyes).   Yes Historical Provider, MD  cetirizine-pseudoephedrine (ZYRTEC-D) 5-120 MG tablet Take 1 tablet by mouth daily as needed for allergies.   Yes Historical Provider,  MD  CREON 24000 units CPEP Take 2 capsules by mouth 4 (four) times daily. Take 2 capsules with meals and 1 capsule with snacks. 05/15/15  Yes Historical Provider, MD  HYDROcodone-acetaminophen (NORCO/VICODIN) 5-325 MG tablet Take 1 tablet by mouth every 4 (four) hours as needed for moderate pain.    Yes Historical Provider, MD  ondansetron (ZOFRAN) 8 MG tablet Take 8 mg by mouth every 8 (eight) hours as needed for nausea or vomiting.  10/06/14  Yes Historical Provider, MD  pantoprazole (PROTONIX) 40 MG tablet Take 40 mg by mouth daily as needed (acid reflux).     Yes Historical Provider, MD  ranitidine (ZANTAC 150 MAXIMUM STRENGTH) 150 MG tablet Take 150 mg by mouth as needed for heartburn.   Yes Historical Provider, MD  simethicone (MYLICON) 0000000 MG chewable tablet Chew 125 mg by mouth every 6 (six) hours as needed for flatulence.   Yes Historical Provider, MD  sodium chloride (OCEAN) 0.65 % SOLN nasal spray Place 1 spray into both nostrils as needed for congestion.   Yes Historical Provider, MD    Allergies as of 06/02/2015 - Review Complete 06/02/2015  Allergen Reaction Noted  . Doxycycline Itching 03/21/2015  . Colestipol Itching 09/14/2014  . Doxycycline Rash 09/05/2014    Family History  Problem Relation Age of Onset  . Stomach cancer Father 49    deceased  . Colon cancer Neg Hx     Social History   Social History  . Marital Status: Married    Spouse Name: N/A  . Number of Children: 1  . Years of Education: N/A   Occupational History  . truck driver    Social History Main Topics  . Smoking status: Never Smoker   . Smokeless tobacco: Never Used     Comment: quit age 37 - 1 pack weekly   . Alcohol Use: No     Comment: former drinks about a pint of borboun per week  . Drug Use: No  . Sexual Activity:    Partners: Female    Museum/gallery curator: None   Other Topics Concern  . Not on file   Social History Narrative    Review of Systems: See HPI, otherwise negative ROS   Physical Exam: BP 127/67 mmHg  Pulse 90  Temp(Src) 97.7 F (36.5 C) (Oral)  Resp 13  Ht 5\' 9"  (1.753 m)  Wt 171 lb 4.8 oz (77.7 kg)  BMI 25.28 kg/m2  SpO2 98% General:   Alert,  pleasant and cooperative in NAD Head:  Normocephalic and atraumatic. Neck:  Supple; Lungs:  Clear throughout to auscultation.    Heart:  Regular rate and rhythm. Abdomen:  Soft, nontender and nondistended. Normal bowel sounds, without guarding, and without rebound.   Neurologic:  Alert and  oriented x4;  grossly normal neurologically.  Impression/Plan:      BRBPR  PLAN: FLEX SIG/EGD TODAY

## 2015-06-02 NOTE — ED Notes (Signed)
Pt with history of pancreatic cancer was at [place], sent home and when he got home he had a bloody bm and called ems to return him to the hospital

## 2015-06-02 NOTE — Op Note (Addendum)
[facility] Patient Name: William Rivera Procedure Date: 06/02/2015 3:49 PM MRN: [id number] Date of Birth: May 07, 1935 Attending MD: Barney Drain , MD CSN: [id number] Age: 80 Admit Type: Inpatient Procedure:                Upper GI endoscopy Indications:              Hematochezia, Melena Providers:                Barney Drain, MD, Janeece Riggers, RN, Isabella Stalling,                            Technician Referring MD:             Halford Chessman Medicines:                Meperidine 75 mg IV, Midazolam 4 mg IV Complications:            No immediate complications. Estimated Blood Loss:     Estimated blood loss: none. Procedure:                Pre-Anesthesia Assessment:                           - Prior to the procedure, a History and Physical                            was performed, and patient medications and                            allergies were reviewed. The patient's tolerance of                            previous anesthesia was also reviewed. The risks                            and benefits of the procedure and the sedation                            options and risks were discussed with the patient.                            All questions were answered, and informed consent                            was obtained. Prior Anticoagulants: The patient has                            taken no previous anticoagulant or antiplatelet                            agents. ASA Grade Assessment: III - A patient with                            severe systemic disease. After reviewing the risks  and benefits, the patient was deemed in                            satisfactory condition to undergo the procedure.                           After obtaining informed consent, the endoscope was                            passed under direct vision. Throughout the                            procedure, the patient's blood pressure, pulse, and                            oxygen  saturations were monitored continuously. The                            [id number]) scope was introduced through the                            mouth, and advanced to the duodenal bulb. UNABLE TO                            ADVANCE SCOPE BEYOND THE DUODENAL BULB DUE TO                            OBSTRUCTION FROM STENT. UNABLE TO APPRECIATE TUMOR.                            The upper GI endoscopy was accomplished without                            difficulty. The patient tolerated the procedure                            well. Scope In: 4:11:02 PM Scope Out: 4:15:37 PM Total Procedure Duration: 0 hours 4 minutes 35 seconds  Findings:      The examined esophagus was normal.      Mild inflammation characterized by erythema was found in the stomach.      LARGE DUODENAL ULCER WITH VISIBLE VESSEL Impression:               - Normal esophagus.                           - Gastritis, MILD.                           - GI BLEED DUE TO LARGE DUODENAL ULCER WITH LARGE                            VISIBLE VESSEL, HIGH RISK TO REBLEED WITHOUT  INTERVENTION Moderate Sedation:      Moderate (conscious) sedation was administered by the endoscopy nurse       and supervised by the endoscopist. The following parameters were       monitored: oxygen saturation, heart rate, blood pressure, and response       to care. Total physician intraservice time was 15 minutes. Recommendation:           - Transfer patient to another hospital WHERE                            IR/SURGERY/BILIARY SPECIALIST AVAILABLE                           - Clear liquid diet.                           - Continue present medications.                           - Patient has a contact number available for                            emergencies. The signs and symptoms of potential                            delayed complications were discussed with the                            patient. Return to normal  activities tomorrow.                            Written discharge instructions were provided to the                            patient. Procedure Code(s):        --- Professional ---                           [phone], Esophagogastroduodenoscopy, flexible,                            transoral; diagnostic, including collection of                            specimen(s) by brushing or washing, when performed                            (separate procedure)                           G0500, Moderate sedation services provided by the                            same physician or other qualified health care                            professional performing a  gastrointestinal                            endoscopic service that sedation supports,                            requiring the presence of an independent trained                            observer to assist in the monitoring of the                            patient's level of consciousness and physiological                            status; initial 15 minutes of intra-service time;                            patient age 21 years or older (additional time may                            be reported with [phone], as appropriate) Diagnosis Code(s):        --- Professional ---                           K29.70, Gastritis, unspecified, without bleeding                           K26.4, Chronic or unspecified duodenal ulcer with                            hemorrhage                           K92.1, Melena (includes Hematochezia) CPT copyright 2016 American Medical Association. All rights reserved. The codes documented in this report are preliminary and upon coder review may  be revised to meet current compliance requirements. Barney Drain, MD Barney Drain, MD 06/02/2015 4:41:33 PM This report has been signed electronically. Number of Addenda: 1 Addendum Number: 1   Addendum Date: 06/02/2015 5:05:49 PM      1620: Paged Dr. Percell Belt [facility]. 1640:  Spoke iwth Dr. Liz Malady. No       floor or ICU beds available at [facility]. [place]: SPOKE WITH DR. Sarajane Jews. WILL       ARRANGE TRANSFER TO [facility] [id number]: Spoke with Dr. Wonda Horner. Reviewed       imaged. Agrees endoscopic intervention not feasible-risk of bleeding far       outweighs benefits. Discussed with Dr. Wallene Huh. He is aware of pt.       Dr. Carlean Jews arranging for transfer Barney Drain, MD Barney Drain, MD 06/02/2015 5:09:17 PM This report has been signed electronically.

## 2015-06-02 NOTE — Care Management Important Message (Signed)
Important Message  Patient Details  Name: William Rivera MRN: [id number] Date of Birth: 31-Mar-1935   Medicare Important Message Given:  Yes    Sherald Barge, RN 06/02/2015, 3:16 PM

## 2015-06-02 NOTE — Care Management Note (Signed)
Case Management Note  Patient Details  Name: William Rivera MRN: [id number] Date of Birth: [birth year]  Subjective/Objective:                  Pt admitted with GIB. Pt is from home, lives with his wife and is ind with ADL's. Pt with cancer and says he has been told there is no other tx options for him. Pt says he is not ready for Hospice at this time but would like to know how to get set up with them when ready. Pt encouraged not to wait until he "needs" them and explained how to contact any hospice agency for services. Pt given list of hospice agencies to choose from. Pt has PCP, transportation and no difficulty affording meds. Pt plans to return home with self care at DC.   Action/Plan: No CM needs anticipated.   Expected Discharge Date:    06/03/2015               Expected Discharge Plan:  Home/Self Care  In-House Referral:  NA  Discharge planning Services  CM Consult  Post Acute Care Choice:  NA Choice offered to:  NA  DME Arranged:    DME Agency:     HH Arranged:    HH Agency:     Status of Service:  Completed, signed off  Medicare Important Message Given:  Yes Date Medicare IM Given:    Medicare IM give by:    Date Additional Medicare IM Given:    Additional Medicare Important Message give by:     If discussed at [place] of Stay Meetings, dates discussed:    Additional Comments:  Sherald Barge, RN 06/02/2015, 3:17 PM

## 2015-06-02 NOTE — ED Notes (Signed)
Patient on cardiac monitor, EKG done @ 0305 and given to Dr Leonides Schanz.

## 2015-06-02 NOTE — Progress Notes (Signed)
Initial Nutrition Assessment  DOCUMENTATION CODES:  Severe malnutrition in context of chronic illness    Pt meets criteria for Severe MALNUTRITION in the context of Chronic illness as evidenced by loss of >10% bw in 6 months and an oral intake that met < or equal to 75% of estimated requirements for > or equal to 1 month.  INTERVENTION:  Magic cup BID with meals, each supplement provides 290 kcal and 9 grams of protein  MVI with minerals  NUTRITION DIAGNOSIS:  Unintentional weight loss related to hypermetabolic disease state (Metastatic cancer) as evidenced by loss of >10% bw in 6 months  GOAL:  Patient will meet greater than or equal to 90% of their needs  MONITOR:  PO intake, Supplement acceptance, Diet advancement, Labs  REASON FOR ASSESSMENT:  Malnutrition Screening Tool    ASSESSMENT:  80 y/o male PMHx htn, gerd, and pancreatic cancer w/ lung mets. Presented after having large bloody bowel movement. Admitted for work up of his GIB  Pt stopped chemotherapy 4 weeks ago due to inability to tolerate and no longer being surgical candidate when metastases found in lung.  RD spoke with pt and wife. Pt reports that uncontrolled postprandial pain is largely his main problem. He says that ~ 5 minutes after eating he experiences intense pain. This has been occuring for "a good while now". Additionally, he says the vast majority of foods "taste like metal". Likely residual affect from chemo. RD went over basic eating tips that can potentially lessen metallic taste ie plastic silverware, marinades, sticking to sweet/tart foods.   Denies n/v/c/d  Pt said that after his initial diagnosis, he was able to eat a good 2 meals a day up until the end of February at which time he finished radiation and begun chemo. Since that time, pt and wife report he is eating the equivalent of 1 meal a day. In addition, he drinks 1 ensure daily. He did not take any vitamins. He did not mechanically alter his  food in any way and says that different consistencies ie chopped vs whole vs liquids do not influence the pain any differently. He takes pancreatic enzymes with his meals and believes these have helped.   Pt currently NPO for procedure. His last meal was Wednesday. On diet advancement, was open to [employer] for variety. He does not have any food intolerances. His weight of loss is alarming and likely largely related to minimal PO intake and very high energy requirements. RD encouraged him to continue to voice to his oncologist that his pain is not controlled and this is preventing him from eating sufficient amounts, thusly increasing morbidity/mortality.  Pt has lost >10% bw in 6 months and >20% bw in 1 year. Expected given Metastatic pancreatic cancer w/ cessation of treatment  Labs reviewed: H/H:6.8/20.4, RBC:2.18, Albumin: 2.8, Total pro: 5.7   Recent Labs Lab 06/02/15 0311  NA 135  K 4.3  CL 105  CO2 25  BUN 21*  CREATININE 1.44*  CALCIUM 8.1*  GLUCOSE 147*   Diet Order:  Diet NPO time specified  Skin:  Reviewed, no issues  Last BM:  5/10 (usually has bm every other day)  Height:  Ht Readings from Last 1 Encounters:  06/02/15 5' 9"  (1.753 m)   Weight:  Wt Readings from Last 1 Encounters:  06/02/15 171 lb 4.8 oz (77.7 kg)   Wt Readings from Last 10 Encounters:  06/02/15 171 lb 4.8 oz (77.7 kg)  05/14/15 170 lb (77.111 kg)  03/21/15 186 lb (84.369 kg)  03/07/15 189 lb (85.73 kg)  02/19/15 198 lb (89.812 kg)  01/02/15 194 lb 3.6 oz (88.1 kg)  01/02/15 193 lb 12.8 oz (87.907 kg)  09/29/14 200 lb 6.4 oz (90.901 kg)  09/14/14 218 lb (98.884 kg)  09/07/14 206 lb 4.8 oz (93.577 kg)  Presented at weight of 165 lbs   Ideal Body Weight:  72.73 kg  BMI:  Body mass index is 25.28 kg/(m^2).  Estimated Nutritional Needs:  Kcal:  2250-2450 (30-33 kcal/kg bw) Protein:  90-105 (1.2-1.4 g/kg bw) Fluid:  >2.2 liters fluid  EDUCATION NEEDS:  No education needs identified at  this time  Burtis Junes RD, LDN Clinical Nutrition Pager: [phone] 06/02/2015 3:16 PM

## 2015-06-02 NOTE — Consult Note (Signed)
Referring Provider: Samuella Cota, MD Primary Care Physician:  [name], MD Primary Gastroenterologist:  Barney Drain, MD   Reason for Consultation:  William Rivera bleed  HPI: William Rivera is a 80 y.o. male with history of stage IIA pancreatic adenocarcinoma that was previously borderline resectable, but biopsy confirmed metastatic lung nodules. Recently seen by his oncologist at [facility] last month. Patient tolerated chemotherapy poorly and declined any further treatment. He had ERCP with metal stent placement back in February 2017 at [facility]. 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  Patient evaluated at [facility] ER yesterday for abdominal pain. According to patient and family was told workup was normal and had unremarkable labs, CT scan showed no significant changes. Prior to leaving ER but after discharge, patient had bloody BM. Spoke with staff that recommend he monitor at home. When he arrived home he felt very lightheaded and passed out. He had another large bloody bowel movement when he passed out, dark maroon colored stool with clots. Presented to the ER for evaluation. Hemoglobin 8.7, had been above 12 less than one week ago at [facility]. Prior colonoscopy in 2015 with mild sigmoid diverticulosis, internal hemorrhoids.    CT abdomen and pelvis with contrast 06/01/2015 at [facility] Impression: No significant change in pancreatic head mass surrounding internal biliary stent, consistent with known pancreatic carcinoma. Stable subcentimeter peripancreatic lymph nodes in the porta hepatis and gastrohepatic ligament. No other Rivera of metastasis identified within the abdomen or pelvis. Stable diffuse tiny subcentimeter bibasilar pulmonary nodules, consistent with pulmonary metastases. New tiny right pleural effusion.  Last episode of bleeding at home early this morning.    Prior to Admission  medications   Medication Sig Start Date End Date Taking? Authorizing Provider  HYDROcodone-acetaminophen (NORCO/VICODIN) 5-325 MG tablet Take 1 tablet by mouth every 6 (six) hours as needed for moderate pain.     Historical Provider, MD  hydrOXYzine (ATARAX/VISTARIL) 25 MG tablet Take 25 mg by mouth 3 (three) times daily as needed for itching.    Historical Provider, MD  morphine (MS CONTIN) 15 MG 12 hr tablet Take 1 tablet by mouth 3 (three) times daily as needed for pain.  04/17/15   Historical Provider, MD  ondansetron (ZOFRAN) 8 MG tablet Take 8 mg by mouth every 8 (eight) hours as needed for nausea or vomiting.  10/06/14   Historical Provider, MD  oxyCODONE (OXY IR/ROXICODONE) 5 MG immediate release tablet Take 1 tablet by mouth every 4 (four) hours as needed for moderate pain or severe pain.  04/04/15   Historical Provider, MD  pantoprazole (PROTONIX) 40 MG tablet Take 40 mg by mouth daily.    Historical Provider, MD  predniSONE (DELTASONE) 20 MG tablet Take 1 tablet (20 mg total) by mouth daily with breakfast. 2 tablets for 6 days, one tablet for 6 days Patient not taking: Reported on 05/14/2015 03/07/15   Nat Christen, MD  ranitidine (ZANTAC 150 MAXIMUM STRENGTH) 150 MG tablet Take 150 mg by mouth as needed for heartburn.    Historical Provider, MD  sucralfate (CARAFATE) 1 g tablet Take 1 g by mouth 4 (four) times daily -  with meals and at bedtime.    Historical Provider, MD    Current Facility-Administered Medications  Medication Dose Route Frequency Provider Last Rate Last Dose  . 0.9 %  sodium chloride infusion   Intravenous Continuous Kristen N Ward, DO 125 mL/hr at 06/02/15 0600    .  0.9 %  sodium chloride infusion   Intravenous STAT Kristen N Ward, DO      . 0.9 %  sodium chloride infusion   Intravenous Continuous Jani Gravel, MD   Stopped at 06/02/15 0530  . acetaminophen (TYLENOL) tablet 650 mg  650 mg Oral Q6H PRN Jani Gravel, MD       Or  . acetaminophen (TYLENOL) suppository 650 mg  650  mg Rectal Q6H PRN Jani Gravel, MD      . HYDROmorphone (DILAUDID) injection 0.5 mg  0.5 mg Intravenous Q4H PRN Jani Gravel, MD      . ondansetron [facility]) injection 4 mg  4 mg Intravenous Q6H PRN Jani Gravel, MD      . ondansetron [facility]) tablet 8 mg  8 mg Oral Q8H PRN Jani Gravel, MD      . oxyCODONE (Oxy IR/ROXICODONE) immediate release tablet 10 mg  10 mg Oral Q6H PRN Jani Gravel, MD   10 mg at 06/02/15 [id number]  . oxyCODONE (Oxy IR/ROXICODONE) immediate release tablet 5 mg  5 mg Oral Q4H PRN Jani Gravel, MD   5 mg at 06/02/15 0555  . pantoprazole (PROTONIX) 80 mg in sodium chloride 0.9 % 250 mL (0.32 mg/mL) infusion  8 mg/hr Intravenous Continuous Jani Gravel, MD 25 mL/hr at 06/02/15 0652 8 mg/hr at 06/02/15 [id number]  . [START ON 06/05/2015] pantoprazole (PROTONIX) injection 40 mg  40 mg Intravenous Q12H Jani Gravel, MD      . sodium chloride flush (NS) 0.9 % injection 3 mL  3 mL Intravenous Q12H Jani Gravel, MD   3 mL at 06/02/15 0804    Allergies as of 06/02/2015 - Review Complete 06/02/2015  Allergen Reaction Noted  . Doxycycline Itching 03/21/2015  . Colestipol Itching 09/14/2014  . Doxycycline Rash 09/05/2014    Past Medical History  Diagnosis Date  . Hypertension   . Gout   . Barrett's esophagus     last EGD/Bx 12/11  . Helicobacter pylori gastritis 2000    s/p treatment  . Adenomatous colon polyp 2010  . GERD (gastroesophageal reflux disease)   . GASTRITIS 09/23/2008    Qualifier: Diagnosis of  By: Nicole Kindred LPN, Doris    . Cancer of pancreas, other site AUG 2016    CA 19-9 958.6  . Pancreatic cancer ([place])   . Pancreatic cancer [facility])     Past Surgical History  Procedure Laterality Date  . Esophagogastroduodenoscopy  2008    DUODENITIS & GASTRITIS 2o TO NSAIDS/ETOH  . Colonoscopy  2010    simple adenomas  . Neck surgery      DISC REPLACED  . Bladder surgery  [id number]  . Sinus exploration  2000  . Tonsillectomy      AS A CHILD  . Esophagogastroduodenoscopy  12/2009    short segment  Barrett's, small hh, chronic gastritis  . Nasal septum surgery    . Esophagogastroduodenoscopy  01/04/2011    [id number] gastritis/Barrett's, possible  . Esophagogastroduodenoscopy N/A 11/30/2012    Dr. Oneida Alar- normal esophagus, empiric dilation d/t c/o dysphagia/?cervical web, stomach= mild non erosive gastritis, inflammation on bx, duodenum= no abnormalities in the bulb and second portion of the duodenum. dilation at the gastroesphageal junction.  Azzie Almas dilation N/A 11/30/2012    Procedure: SAVORY DILATION;  Surgeon: Danie Binder, MD;  Location: [facility] ENDO SUITE;  Service: Endoscopy;  Laterality: N/A;  Venia Minks dilation N/A 11/30/2012    Procedure: Venia Minks DILATION;  Surgeon: Danie Binder, MD;  Location: [facility] ENDO SUITE;  Service: Endoscopy;  Laterality: N/A;  . 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 Colonoscopy N/A 07/28/2013    Dr. Oneida Alar: tubular adenoma, mild sigmoid colon diverticulosis, internal hemorhhoids   . Doppler echocardiography  2009  . Nm myoview ltd  2009  . Ercp N/A 09/14/2014    Dr. Laural Golden: sphincterotomy with 53 F plastic biliary stent placement   . Biliary stent placement N/A 09/14/2014    Procedure: BILIARY STENT PLACEMENT;  Surgeon: Rogene Houston, MD;  Location: [facility] ORS;  Service: Endoscopy;  Laterality: N/A;  . Sphincterotomy N/A 09/14/2014    Procedure: SPHINCTEROTOMY;  Surgeon: Rogene Houston, MD;  Location: [facility] ORS;  Service: Endoscopy;  Laterality: N/A;  . Esophagogastroduodenoscopy (egd) with propofol N/A 01/04/2015    RMR: partial uoddenal obstruction at junction of 1st and 2nd portion of duodenum, unale to intubate 2nd portion of duodenum to perform ERCP  . Ercp N/A 01/04/2015    Procedure: ATTEMPTED ENDOSCOPIC RETROGRADE CHOLANGIOPANCREATOGRAPHY (ERCP);  Surgeon: Daneil Dolin, MD;  Location: [facility] ORS;  Service: Endoscopy;  Laterality: N/A;  . Stents in bile ducts    . Hemorrhoid surgery    . Ercp  02/2015    [facility]: biliary stent exchange from plastic to metal  stent    Family History  Problem Relation Age of Onset  . Stomach cancer Father 75    deceased  . Colon cancer Neg Hx     Social History   Social History  . Marital Status: Married    Spouse Name: N/A  . Number of Children: 1  . Years of Education: N/A   Occupational History  . truck driver    Social History Main Topics  . Smoking status: Never Smoker   . Smokeless tobacco: Never Used     Comment: quit age 20 - 1 pack weekly   . Alcohol Use: No     Comment: former drinks about a pint of borboun per week  . Drug Use: No  . Sexual Activity:    Partners: Female    Museum/gallery curator: None   Other Topics Concern  . Not on file   Social History Narrative     ROS:  General: 50# weight loss per patient. Fatigue/weak. No fever/chills Eyes: Negative for vision changes.  ENT: Negative for hoarseness, difficulty swallowing , nasal congestion. CV: Negative for chest pain, angina, palpitations, dyspnea on exertion, peripheral edema.  Respiratory: Negative for dyspnea at rest, dyspnea on exertion, cough, sputum, wheezing.  GI: See history of present illness. GU:  Negative for dysuria, hematuria, urinary incontinence, urinary frequency, nocturnal urination.  MS: Negative for joint pain, low back pain.  Derm: Negative for rash or itching.  Neuro: Negative for weakness, abnormal sensation, seizure, frequent headaches, memory loss, confusion.  Psych: Negative for anxiety, depression, suicidal ideation, hallucinations.  Endo: see General  Heme: Negative for bruising or bleeding. Allergy: Negative for rash or hives.       Physical Examination: Vital signs in last 24 hours: Temp:  [98.3 F (36.8 C)-98.5 F (36.9 C)] 98.5 F (36.9 C) (05/12 0544) Pulse Rate:  [101-110] 102 (05/12 0600) Resp:  [15-22] 20 (05/12 0600) BP: (105-144)/(53-82) 144/71 mmHg (05/12 0600) SpO2:  [92 %-97 %] 96 % (05/12 0600) Weight:  [165 lb (74.844 kg)-171 lb 4.8 oz (77.7 kg)] 171 lb 4.8 oz  (77.7 kg) (05/12 0525)    General: Well-nourished, well-developed in no acute distress. In good spirits. Head: Normocephalic, atraumatic.   Eyes: Conjunctiva pink, no icterus.  Mouth: Oropharyngeal mucosa moist and pink , no lesions erythema or exudate. Neck: Supple without thyromegaly, masses, or lymphadenopathy.  Lungs: Clear to auscultation bilaterally.  Heart: Regular rate and rhythm, no murmurs rubs or gallops.  Abdomen: Bowel sounds are normal, moderate epigastric tenderness, nondistended, no hepatosplenomegaly or masses, no abdominal bruits or    hernia , no rebound or guarding.   Rectal: done in er, red blood with clots.  Extremities: No lower extremity edema, clubbing, deformity.  Neuro: Alert and oriented x 4 , grossly normal neurologically.  Skin: Warm and dry, no rash or jaundice.   Psych: Alert and cooperative, normal mood and affect.        Intake/Output from previous day: 05/11 0701 - 05/12 0700 In: 27.1 [I.V.:27.1] Out: -  Intake/Output this shift: Total I/O In: 3 [I.V.:3] Out: -   Lab Results: CBC  Recent Labs  06/02/15 0311 06/02/15 0804  WBC 9.6 7.7  HGB 8.7* 6.8*  HCT 25.8* 20.4*  MCV 94.2 93.6  PLT 305 213   BMET  Recent Labs  06/02/15 0311  NA 135  K 4.3  CL 105  CO2 25  GLUCOSE 147*  BUN 21*  CREATININE 1.44*  CALCIUM 8.1*   LFT  Recent Labs  06/02/15 0311  BILITOT 0.4  ALKPHOS 90  AST 20  ALT 15*  PROT 5.7*  ALBUMIN 2.8*   Hgb 12.0 on 05/29/15 at [facility]  Lipase  Recent Labs  06/02/15 0311  LIPASE 11    PT/INR  Recent Labs  06/02/15 0311  LABPROT 16.6*  INR 1.33      Imaging Studies: No results found.Minnie.Brome week]   Impression: 80 year old gentleman with metastatic pancreatic adenocarcinoma (no longer on chemotherapy secondary to poor tolerance), status post biliary metal stent in February 2017 who presents with complaints of GI bleeding and syncopal episode. Patient noted to have a hemoglobin of 12.0 on  05/29/2015 at [facility]. Presented to the emergency department early this morning with a hemoglobin of 8. Hemoglobin currently in the 6 range. Noted to have red blood with clots in the ER. Patient describes dark maroon color stool. No NSAIDs or aspirin use. He continues to have ongoing postprandial epigastric pain, weight loss which is felt to be related to his pancreatic adenocarcinoma. Of note patient had evidence of narrowing at the junction of D1 and D2 likely from tumor at time of failed ERCP here back in December 2016. Would be concerned about malignant GI bleeding, diverticular bleed not excluded.  Plan: 1. Plan on EGD today. 2. Transfuse 2 units of packed red blood cells. 3. Continue IV Protonix.  We would like to thank you for the opportunity to participate in the care of William Rivera.  Laureen Ochs. Bernarda Caffey [facility] [phone] 5/12/20179:55 AM     LOS: 0 days

## 2015-06-02 NOTE — Progress Notes (Addendum)
PROGRESS NOTE  William Rivera [id number] DOB: 09-14-1935 DOA: 06/02/2015 PCP: [name], MD  Brief Narrative: 43 yom with past medical history of pancreatic cancer and colonoscopy 2015 that revealed mild sigmoid diverticulosis and internal hemorrhoids. Presented with complaints of bloody stools and LOC. [name] any injuries during LOC and reports passing dark marron colored stools and clots. Before arriving to [facility] he presented to [facility] for abdominal pain where he was cleared for release after unremarkable labs and CT scan. In the ED noted to be heme positive with Hgb 8.7.  Assessment/Plan: 1. GI bleed with acute blood loss anemia. Hemodynamics are stable. Hemoglobin trending down. Appreciate GI consultation. Continue protonix and plan for EGD today per GI. Remain NPO. 2. Acute blood loss anemia. 3. Acute kidney injury superimposed on chronic kidney disease stage III. Acute injury resolving with fluids. 4. Syncope secondary to vasovagal episode. Follow up orthostatic BP. Troponin negative. 5. Barretts esophagus, continue PPI.   6. Pancreatic carcinoma, no change noted in CT A/P with contrast 5/11.  7. Gout, stable.    Appears stable.  Continue PPI and agree with transfusion 2U PRBC. Follow-up EGD later today.  Continue to trend hemoglobin.  Addendum 1700 Discussed with Dr. Oneida Alar, patient has a large duodenal ulcer, she recommends transfer to [facility]. She has discussed the case with Dr. Paulita Fujita who recommends interventional radiology management if bleeding recurs. Dr. Oneida Alar is also discussed the case with Dr. Reesa Chew of IR.  We'll transfer to [facility] stepdown bed Dr. Sherral Hammers.  DVT prophylaxis: SCDs Code Status: SNF Family Communication: No family bedside.  Disposition Plan: Likely discharge to SNF  Murray Hodgkins, MD  [facility] Direct contact:  --Via amion app OR  --[email or web address]; password [email or web address] and click  [email or web address] contact night coverage as  above 06/02/2015, 6:48 AM  LOS: 0 days   Consultants:  Gastroenterology   Procedures:  Transfuse 2U PRBC 5/12  Antimicrobials:  None  HPI/Subjective: Abdominal pain resolved with pain medications. Bleeding has resolved since admission. Denies any injuries from LOC.   Objective: Filed Vitals:   06/02/15 0518 06/02/15 0525 06/02/15 0544 06/02/15 0600  BP:  139/60  144/71  Pulse:  103  102  Temp: 98.4 F (36.9 C)  98.5 F (36.9 C)   TempSrc: Oral  Oral   Resp:    20  Height: 5\' 9"  (1.753 m) 5\' 9"  (1.753 m)    Weight: 77.7 kg (171 lb 4.8 oz) 77.7 kg (171 lb 4.8 oz)    SpO2:  94%  96%    Intake/Output Summary (Last 24 hours) at 06/02/15 0648 Last data filed at 06/02/15 0600  Gross per 24 hour  Intake  27.08 ml  Output      0 ml  Net  27.08 ml     Filed Weights   06/02/15 0308 06/02/15 0518 06/02/15 0525  Weight: 74.844 kg (165 lb) 77.7 kg (171 lb 4.8 oz) 77.7 kg (171 lb 4.8 oz)    Exam: Constitutional:  . Appears calm and comfortable Eyes:  . PERRL and irises appear normal . Conjunctivae and lids appear normal ENMT:  . external ears, nose appear normal . grossly normal hearing  Respiratory:  . CTA bilaterally, no w/r/r.  . Respiratory effort normal. No retractions or accessory muscle use Cardiovascular:  . RRR, no m/r/g . No LE extremity edema   . Telemetry SR Abdomen:  . Abdomen appears normal; no tenderness or masses . No hernias Musculoskeletal:  . Normal RUE, LUE,  RLE, LLE   o strength and tone normal, no atrophy, no abnormal movements Psychiatric:  . judgement and insight appear normal . Mental status o Mood, affect appropriate  I have personally reviewed following labs and imaging studies:  Hgb 10.8>>8.7>>6.8  Creatinine 1.44/BUN 21, trending Troponin .03 down  Glucose 147  Scheduled Meds: . sodium chloride   Intravenous STAT  . pantoprazole (PROTONIX) IV  80 mg Intravenous Once  . [START ON 06/05/2015] pantoprazole (PROTONIX) IV  40  mg Intravenous Q12H  . sodium chloride flush  3 mL Intravenous Q12H   Continuous Infusions: . sodium chloride 125 mL/hr at 06/02/15 0600  . sodium chloride    . pantoprozole (PROTONIX) infusion      Principal Problem:   GI bleeding Active Problems:   Pancreatic cancer (HCC)   Acute blood loss anemia   AKI (acute kidney injury) ([employer])   Syncope   LOS: 0 days   Time spent 25 minutes   By signing my name below, I, Rennis Harding attest that this documentation has been prepared under the direction and in the presence of Murray Hodgkins, MD Electronically signed: Rennis Harding 06/02/2015 10:00AM   I personally performed the services described in this documentation. All medical record entries made by the scribe were at my direction. I have reviewed the chart and agree that the record reflects my personal performance and is accurate and complete. Murray Hodgkins, MD

## 2015-06-02 NOTE — Progress Notes (Signed)
I discussed case with Dr. Barney Drain.  Patient with unresectable pancreatic cancer and palliative fully covered metal stent.  Presents with GI bleeding.  Had endoscopy showing large duodenal ulcer with large visible vessel, in vicinity of distal margin of his biliary stent; pictures are available in EPIC.  No active bleeding was noted at time of patient's endoscopy.   Both Dr. Oneida Alar and I feel there is absolutely no role for repeat endoscopy in the possible event of rebleeding.  If patient has further rebleeding, would need interventional radiology consultation for consideration of angiogram with embolization.  Would treat patient with PPI drip for now.  I will check on patient in the morning.

## 2015-06-02 NOTE — ED Notes (Signed)
Report given to James RN in ICU 

## 2015-06-02 NOTE — H&P (Addendum)
TRH H&P   Patient Demographics:    William Rivera, is a 80 y.o. male  MRN: [id number]   DOB - 1935/11/24  Admit Date - 06/02/2015  Outpatient Primary MD for the patient is [name], MD  Referring MD/NP/PA: Wyona Almas  Outpatient Specialists: Rehman/[facility]ourke  Patient coming from: home  Chief Complaint  Patient presents with  . GI Bleeding      HPI:    William Rivera  is a 80 y.o. male, with pancreatic cancer, colonoscopy in 2015 => mild sigmoid diverticulosis, int hemorrhoids,  who apparently presented to [facility] ER yesterday for abdominal pain.  He was told that he had a normal workup there with unremarkable labs, CT scan that showed no significant change. He was discharged and states on the way out of the emergency department he had a bloody bowel movement. He did not check back into the emergency department but went home. At home he began to feel very lightheaded and began to pass out. His wife helped lower him to the ground before he lost consciousness completely. He did not hit his head. He had a large bloody bowel movement  when he passed out. They reported as dark maroon colored and he is passing clots. Pt denies nsaid use or aspirin use.  Pt presented to [facility] ER and found to be heme positive and also Hgb 8.7  Pt will be admitted for GI bleeding.    Review of systems:    In addition to the HPI above,  No Fever-chills, No Headache, No changes with Vision or hearing, No problems swallowing food or Liquids, No Chest pain, Cough or Shortness of Breath, + Abdominal pain (epigastric, chronic), No Nausea or Vommitting, Bowel movements are regular, No Blood in stool or Urine, No dysuria, No new skin rashes or bruises, No new joints pains-aches,  No new weakness, tingling, numbness in any extremity, No recent weight gain or loss, No polyuria, polydypsia or  polyphagia, No significant Mental Stressors.  A full 10 point Review of Systems was done, except as stated above, all other Review of Systems were negative.   With Past History of the following :    Past Medical History  Diagnosis Date  . Hypertension   . Gout   . Barrett's esophagus     last EGD/Bx 12/11  . Helicobacter pylori gastritis 2000    s/p treatment  . Adenomatous colon polyp 2010  . GERD (gastroesophageal reflux disease)   . GASTRITIS 09/23/2008    Qualifier: Diagnosis of  By: Nicole Kindred LPN, Doris    . Cancer of pancreas, other site AUG 2016    CA 19-9 958.6  . Pancreatic cancer ([place])   . Pancreatic cancer [facility])       Past Surgical History  Procedure Laterality Date  . Esophagogastroduodenoscopy  2008    DUODENITIS & GASTRITIS 2o TO NSAIDS/ETOH  .  Colonoscopy  2010    simple adenomas  . Neck surgery      DISC REPLACED  . Bladder surgery  2025,4270  . Sinus exploration  2000  . Tonsillectomy      AS A CHILD  . Esophagogastroduodenoscopy  12/2009    short segment Barrett's, small hh, chronic gastritis  . Nasal septum surgery    . Esophagogastroduodenoscopy  01/04/2011    WCB:JSEG gastritis/Barrett's, possible  . Esophagogastroduodenoscopy N/A 11/30/2012    Dr. Oneida Alar- normal esophagus, empiric dilation d/t c/o dysphagia/?cervical web, stomach= mild non erosive gastritis, inflammation on bx, duodenum= no abnormalities in the bulb and second portion of the duodenum. dilation at the gastroesphageal junction.  Azzie Almas dilation N/A 11/30/2012    Procedure: SAVORY DILATION;  Surgeon: Danie Binder, MD;  [facility];  Service: Endoscopy;  Laterality: N/A;  Venia Minks dilation N/A 11/30/2012    Procedure: Venia Minks DILATION;  Surgeon: Danie Binder, MD;  Location: [facility];  Service: Endoscopy;  Laterality: N/A;  . 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 Colonoscopy N/A 07/28/2013    Dr. Oneida Alar: tubular adenoma, mild sigmoid colon diverticulosis,  internal hemorhhoids   . Doppler echocardiography  2009  . Nm myoview ltd  2009  . Ercp N/A 09/14/2014    Dr. Laural Golden: sphincterotomy with 86 F plastic biliary stent placement   . Biliary stent placement N/A 09/14/2014    Procedure: BILIARY STENT PLACEMENT;  Surgeon: Rogene Houston, MD;  Location: [facility] ORS;  Service: Endoscopy;  Laterality: N/A;  . Sphincterotomy N/A 09/14/2014    Procedure: SPHINCTEROTOMY;  Surgeon: Rogene Houston, MD;  Location: [facility] ORS;  Service: Endoscopy;  Laterality: N/A;  . Esophagogastroduodenoscopy (egd) with propofol N/A 01/04/2015    Procedure: ESOPHAGOGASTRODUODENOSCOPY (EGD) WITH PROPOFOL;  Surgeon: Daneil Dolin, MD;  Location: [facility] ORS;  Service: Endoscopy;  Laterality: N/A;  . Ercp N/A 01/04/2015    Procedure: ATTEMPTED ENDOSCOPIC RETROGRADE CHOLANGIOPANCREATOGRAPHY (ERCP);  Surgeon: Daneil Dolin, MD;  Location: [facility] ORS;  Service: Endoscopy;  Laterality: N/A;  . Stents in bile ducts    . Hemorrhoid surgery        Social History:     Social History  Substance Use Topics  . Smoking status: Never Smoker   . Smokeless tobacco: Never Used     Comment: quit age 30 - 1 pack weekly   . Alcohol Use: No     Comment: former drinks about a pint of borboun per week     Lives - at home      Family History :     Family History  Problem Relation Age of Onset  . Stomach cancer Father 22    deceased  . Colon cancer Neg Hx       Home Medications:   Prior to Admission medications   Medication Sig Start Date End Date Taking? Authorizing Provider  HYDROcodone-acetaminophen (NORCO/VICODIN) 5-325 MG tablet Take 1 tablet by mouth every 6 (six) hours as needed for moderate pain.     Historical Provider, MD  hydrOXYzine (ATARAX/VISTARIL) 25 MG tablet Take 25 mg by mouth 3 (three) times daily as needed for itching.    Historical Provider, MD  morphine (MS CONTIN) 15 MG 12 hr tablet Take 1 tablet by mouth 3 (three) times daily as needed for pain.  04/17/15    Historical Provider, MD  ondansetron (ZOFRAN) 8 MG tablet Take 8 mg by mouth every 8 (eight) hours as needed for nausea or  vomiting.  10/06/14   Historical Provider, MD  oxyCODONE (OXY IR/ROXICODONE) 5 MG immediate release tablet Take 1 tablet by mouth every 4 (four) hours as needed for moderate pain or severe pain.  04/04/15   Historical Provider, MD  pantoprazole (PROTONIX) 40 MG tablet Take 40 mg by mouth daily.    Historical Provider, MD  predniSONE (DELTASONE) 20 MG tablet Take 1 tablet (20 mg total) by mouth daily with breakfast. 2 tablets for 6 days, one tablet for 6 days Patient not taking: Reported on 05/14/2015 03/07/15   Nat Christen, MD  ranitidine (ZANTAC 150 MAXIMUM STRENGTH) 150 MG tablet Take 150 mg by mouth as needed for heartburn.    Historical Provider, MD  sucralfate (CARAFATE) 1 g tablet Take 1 g by mouth 4 (four) times daily -  with meals and at bedtime.    Historical Provider, MD     Allergies:     Allergies  Allergen Reactions  . Doxycycline Itching    Severe itching   . Colestipol Itching  . Doxycycline Rash     Physical Exam:   Vitals  Blood pressure 105/82, pulse 105, temperature 98.3 F (36.8 C), temperature source Oral, resp. rate 22, height 5' 9"  (1.753 m), weight 74.844 kg (165 lb), SpO2 95 %.   1. General elderly white male lying in bed in NAD,    2. Normal affect and insight, Not Suicidal or Homicidal, Awake Alert, Oriented X 3.  3. No F.N deficits, ALL C.Nerves Intact, Strength 5/5 all 4 extremities, Sensation intact all 4 extremities, Plantars down going.  4. Ears and Eyes appear Normal, Conjunctivae clear, PERRLA. Moist Oral Mucosa.  5. Supple Neck, No JVD, No cervical lymphadenopathy appriciated, No Carotid Bruits.  6. Symmetrical Chest wall movement, Good air movement bilaterally, CTAB.  7. RRR, No Gallops, Rubs or Murmurs, No Parasternal Heave.  8. Positive Bowel Sounds, Abdomen Soft, No tenderness, No organomegaly appriciated,No rebound  -guarding or rigidity.  9.  No Cyanosis, Normal Skin Turgor, No Skin Rash or Bruise.  10. Good muscle tone,  joints appear normal , no effusions, Normal ROM.  11. No Palpable Lymph Nodes in Neck or Axillae  Heme positive stool    Data Review:    CBC  Recent Labs Lab 06/02/15 0311  WBC 9.6  HGB 8.7*  HCT 25.8*  PLT 305  MCV 94.2  MCH 31.8  MCHC 33.7  RDW 14.9  LYMPHSABS 1.1  MONOABS 0.7  EOSABS 0.0  BASOSABS 0.0   ------------------------------------------------------------------------------------------------------------------  Chemistries   Recent Labs Lab 06/02/15 0311  NA 135  K 4.3  CL 105  CO2 25  GLUCOSE 147*  BUN 21*  CREATININE 1.44*  CALCIUM 8.1*  AST 20  ALT 15*  ALKPHOS 90  BILITOT 0.4   ------------------------------------------------------------------------------------------------------------------ estimated creatinine clearance is 43 mL/min (by C-G formula based on Cr of 1.44). ------------------------------------------------------------------------------------------------------------------ No results for input(s): TSH, T4TOTAL, T3FREE, THYROIDAB in the last 72 hours.  Invalid input(s): FREET3  Coagulation profile  Recent Labs Lab 06/02/15 0311  INR 1.33   ------------------------------------------------------------------------------------------------------------------- No results for input(s): DDIMER in the last 72 hours. -------------------------------------------------------------------------------------------------------------------  Cardiac Enzymes  Recent Labs Lab 06/02/15 0311  TROPONINI <0.03   ------------------------------------------------------------------------------------------------------------------ No results found for: BNP   ---------------------------------------------------------------------------------------------------------------  Urinalysis    Component Value Date/Time   COLORURINE YELLOW  03/07/2015 1900   APPEARANCEUR CLEAR 03/07/2015 1900   LABSPEC 1.010 03/07/2015 1900   PHURINE 5.5 03/07/2015 1900   GLUCOSEU 100* 03/07/2015 1900  HGBUR NEGATIVE 03/07/2015 1900   BILIRUBINUR NEGATIVE 03/07/2015 1900   KETONESUR NEGATIVE 03/07/2015 1900   PROTEINUR NEGATIVE 03/07/2015 1900   NITRITE NEGATIVE 03/07/2015 1900   LEUKOCYTESUR NEGATIVE 03/07/2015 1900    ----------------------------------------------------------------------------------------------------------------   Imaging Results:    No results found.   Assessment & Plan:    Active Problems:   Pancreatic cancer ([place])   Sinus tachycardia (HCC)   GI bleed   Renal insufficiency    1. GI/Rectal bleeding Type and screen Serial cbc NPO protonix 39m iv x1, then 871mhr GI consultation  2.  Renal insufficiency Check cmp in am Hydrate with ns iv  3. Anemia Check ferritin, iron, tibc, b12, folate, esr, tsh  4. Tachycardia  ? Due to blood loss Check trop i q6h x3.  If persistent check cardiac echo, and consider CTA chest  5.  Syncope secondary to vasovagal episode ? Check orthostatic bp Tele Trop i q6h x3 If not orthostatic , consider CT brain, carotid usKoreacardiac echo  6. Barretts esophagus protonix  7. Gout Stable  DVT Prophylaxis SCDs   AM Labs Ordered, also please review Full Orders  Family Communication: Admission, patients condition and plan of care including tests being ordered have been discussed with the patient  who indicate understanding and agree with the plan and Code Status.  Code Status FULL CODE  Likely DC to  snf  Condition GUARDED    Consults called: please call GI consult in am  Admission status: inpatient  Time spent in minutes : 4059mcritical care    JamJani GravelD on 06/02/2015 at 4:59 AM  Between 7am to 7pm - Pager - [phone] 7pm go to [email or web address] - password [email or web address] - Office  [phone]

## 2015-06-02 NOTE — ED Provider Notes (Signed)
TIME SEEN: 3:15 AM  CHIEF COMPLAINT: Rectal bleeding, syncope  HPI: Patient is a 80 year old male with history of hypertension, gastritis, pancreatic cancer who is currently not doing treatment who presents emergency department with gastrointestinal bleed, syncope. Reports that he began having upper abdominal pain last night and went to [facility]. He was told that he had a normal workup there with unremarkable labs, CT scan that showed no significant change. Was discharged and states on the way out of the emergency department he had a bloody bowel movement. States he did not check back into the emergency department but went home. At home he began to feel very lightheaded and began to pass out. His wife helped lower him to the ground before he lost consciousness completely. He did not hit his head. States that he had a large bloody bowel movement and his closed when he passed out. They reported as dark maroon colored and he is passing clots. Reports his pain in the upper abdomen has improved. He has not had any nausea or vomiting. He is not on anticoagulation. He denies that he has had any recent chest pain or shortness of breath. He reports he previously had radiation and chemotherapy for his cancer, last chemotherapy was 4 weeks ago but states he was unable to tolerate any further chemotherapy and states that his cancer is not amendable to surgery and he is told has metastasized to his lungs.  Is being followed at [facility] for this. Was diagnosed in August 2016 with pancreatic cancer.  ROS: See HPI Constitutional: no fever  Eyes: no drainage  ENT: no runny nose   Cardiovascular:  no chest pain  Resp: no SOB  GI: no vomiting GU: no dysuria Integumentary: no rash  Allergy: no hives  Musculoskeletal: no leg swelling  Neurological: no slurred speech ROS otherwise negative  PAST MEDICAL HISTORY/PAST SURGICAL HISTORY:  Past Medical History  Diagnosis Date  . Hypertension   . Gout   .  Barrett's esophagus     last EGD/Bx 12/11  . Helicobacter pylori gastritis 2000    s/p treatment  . Adenomatous colon polyp 2010  . GERD (gastroesophageal reflux disease)   . GASTRITIS 09/23/2008    Qualifier: Diagnosis of  By: Nicole Kindred LPN, Doris    . Cancer of pancreas, other site AUG 2016    CA 19-9 958.6  . Pancreatic cancer ([place])   . Pancreatic cancer [facility]     MEDICATIONS:  Prior to Admission medications   Medication Sig Start Date End Date Taking? Authorizing Provider  HYDROcodone-acetaminophen (NORCO/VICODIN) 5-325 MG tablet Take 1 tablet by mouth every 6 (six) hours as needed for moderate pain.     Historical Provider, MD  hydrOXYzine (ATARAX/VISTARIL) 25 MG tablet Take 25 mg by mouth 3 (three) times daily as needed for itching.    Historical Provider, MD  morphine (MS CONTIN) 15 MG 12 hr tablet Take 1 tablet by mouth 3 (three) times daily as needed for pain.  04/17/15   Historical Provider, MD  ondansetron (ZOFRAN) 8 MG tablet Take 8 mg by mouth every 8 (eight) hours as needed for nausea or vomiting.  10/06/14   Historical Provider, MD  oxyCODONE (OXY IR/ROXICODONE) 5 MG immediate release tablet Take 1 tablet by mouth every 4 (four) hours as needed for moderate pain or severe pain.  04/04/15   Historical Provider, MD  pantoprazole (PROTONIX) 40 MG tablet Take 40 mg by mouth daily.    Historical Provider, MD  predniSONE (DELTASONE) 20  MG tablet Take 1 tablet (20 mg total) by mouth daily with breakfast. 2 tablets for 6 days, one tablet for 6 days Patient not taking: Reported on 05/14/2015 03/07/15   Nat Christen, MD  ranitidine (ZANTAC 150 MAXIMUM STRENGTH) 150 MG tablet Take 150 mg by mouth as needed for heartburn.    Historical Provider, MD  sucralfate (CARAFATE) 1 g tablet Take 1 g by mouth 4 (four) times daily -  with meals and at bedtime.    Historical Provider, MD    ALLERGIES:  Allergies  Allergen Reactions  . Doxycycline Itching    Severe itching   . Colestipol Itching  .  Doxycycline Rash    SOCIAL HISTORY:  Social History  Substance Use Topics  . Smoking status: Former Smoker -- 3.00 packs/day    Types: Cigarettes  . Smokeless tobacco: Never Used     Comment: quit age 79 - 1 pack weekly   . Alcohol Use: No     Comment: former drinks about a pint of borboun per week    FAMILY HISTORY: Family History  Problem Relation Age of Onset  . Stomach cancer Father 70    deceased  . Colon cancer Neg Hx     EXAM: BP 118/67 mmHg  Pulse 110  Temp(Src) 98.3 F (36.8 C) (Oral)  Resp 20  Ht 5\' 9"  (1.753 m)  Wt 165 lb (74.844 kg)  BMI 24.36 kg/m2  SpO2 97% CONSTITUTIONAL: Alert and oriented and responds appropriately to questions. Elderly, pale, no CVA distress HEAD: Normocephalic EYES: Conjunctivae clear, PERRL, no conjunctiva ENT: normal nose; no rhinorrhea; moist mucous membranes NECK: Supple, no meningismus, no LAD  CARD: Regular and tachycardic; S1 and S2 appreciated; no murmurs, no clicks, no rubs, no gallops RESP: Normal chest excursion without splinting or tachypnea; breath sounds clear and equal bilaterally; no wheezes, no rhonchi, no rales, no hypoxia or respiratory distress, speaking full sentences ABD/GI: Normal bowel sounds; non-distended; soft, mildly tender to palpation in the epigastric region, no rebound, no guarding, no peritoneal signs RECTAL:  Normal rectal tone, patient has a large amount of red colored blood and clots in the rectal vault, guaiac positive BACK:  The back appears normal and is non-tender to palpation, there is no CVA tenderness EXT: Normal ROM in all joints; non-tender to palpation; no edema; normal capillary refill; no cyanosis, no calf tenderness or swelling    SKIN: Normal color for age and race; warm; no rash NEURO: Moves all extremities equally, sensation to light touch intact diffusely, cranial nerves II through XII intact PSYCH: The patient's mood and manner are appropriate. Grooming and personal hygiene are  appropriate.  MEDICAL DECISION MAKING: Patient here with rectal bleed. He is tachycardic but normotensive. Mildly tender to palpation epigastric region but reports a recent CT scan at [place] that was unremarkable. We will request these records. Outside hospital records show he had a leukocytosis of 10.7, hemoglobin of 9.1, lactate of 1.6. LFTs and lipase were unremarkable. He does have a large amount of blood in the rectal vault. States he has had a colonoscopy by Dr. fields several years ago. Is not sure if he has ever been told he has diverticulosis. Has never had a GI bleed before. Will give IV fluids and continue to closely monitor patient. We'll repeat labs today. I feel he will need admission. EKG shows sinus tachycardia without any other ischemic abnormality, interval changes.  ED PROGRESS: 4:00 AM  Pt's CT report from [facility] shows  no significant change in pancreatic head mass surrounding internal biliary stent consistent with known pancreatic carcinoma. There are also stable sub-centimeters. Pancreatic lymph nodes in the porta hepatis and gastrohepatic ligament. Stable diffuse tiny subcentimeter bibasilar pulmonary nodules consistent with pulmonary metastasis. New tiny right pleural effusion.   4:25 AM  Pt's hemoglobin is 8.7. Coags normal. Creatinine is mildly elevated which is stable. He is still mildly tachycardic but not hypotensive. No further bloody bowel movements in the emergency department. Discussed patient's case with hospitalist, Dr. Maudie Mercury.  Recommend admission to inpatient, stepdown bed.  I will place holding orders per their request. Patient and family (if present) updated with plan. Care transferred to hospitalist service.  I reviewed all nursing notes, vitals, pertinent old records, EKGs, labs, imaging (as available).    EKG Interpretation  Date/Time:  Friday Jun 02 2015 03:06:35 EDT Ventricular Rate:  115 PR Interval:  147 QRS Duration: 76 QT  Interval:  324 QTC Calculation: 448 R Axis:   7 Text Interpretation:  Sinus tachycardia Abnormal R-wave progression, early transition No significant change since last tracing Confirmed by WARD,  DO, KRISTEN [phone]) on 06/02/2015 3:11:09 AM        Orin, DO 06/02/15 [id number]

## 2015-06-03 ENCOUNTER — Inpatient Hospital Stay (HOSPITAL_COMMUNITY): Payer: Medicare Other

## 2015-06-03 DIAGNOSIS — K264 Chronic or unspecified duodenal ulcer with hemorrhage: Principal | ICD-10-CM

## 2015-06-03 DIAGNOSIS — E43 Unspecified severe protein-calorie malnutrition: Secondary | ICD-10-CM | POA: Insufficient documentation

## 2015-06-03 DIAGNOSIS — N179 Acute kidney failure, unspecified: Secondary | ICD-10-CM

## 2015-06-03 DIAGNOSIS — D62 Acute posthemorrhagic anemia: Secondary | ICD-10-CM

## 2015-06-03 LAB — TYPE AND SCREEN
ABO/RH(D): O POS
ANTIBODY SCREEN: NEGATIVE
UNIT DIVISION: 0
Unit division: 0

## 2015-06-03 LAB — COMPREHENSIVE METABOLIC PANEL
ALBUMIN: 2.3 g/dL — AB (ref 3.5–5.0)
ALT: 13 U/L — ABNORMAL LOW (ref 17–63)
AST: 21 U/L (ref 15–41)
Alkaline Phosphatase: 93 U/L (ref 38–126)
Anion gap: 6 (ref 5–15)
BUN: 12 mg/dL (ref 6–20)
CHLORIDE: 110 mmol/L (ref 101–111)
CO2: 24 mmol/L (ref 22–32)
Calcium: 8.4 mg/dL — ABNORMAL LOW (ref 8.9–10.3)
Creatinine, Ser: 1.36 mg/dL — ABNORMAL HIGH (ref 0.61–1.24)
GFR calc Af Amer: 56 mL/min — ABNORMAL LOW (ref >60)
GFR, EST NON AFRICAN AMERICAN: 49 mL/min — AB (ref >60)
Glucose, Bld: 111 mg/dL — ABNORMAL HIGH (ref 65–99)
POTASSIUM: 4.8 mmol/L (ref 3.5–5.1)
SODIUM: 140 mmol/L (ref 135–145)
Total Bilirubin: 0.4 mg/dL (ref 0.3–1.2)
Total Protein: 4.9 g/dL — ABNORMAL LOW (ref 6.5–8.1)

## 2015-06-03 LAB — FERRITIN: FERRITIN: 269 ng/mL (ref 24–336)

## 2015-06-03 LAB — CBC
HEMATOCRIT: 23.1 % — AB (ref 39.0–52.0)
Hemoglobin: 7.7 g/dL — ABNORMAL LOW (ref 13.0–17.0)
MCH: 29.6 pg (ref 26.0–34.0)
MCHC: 33.3 g/dL (ref 30.0–36.0)
MCV: 88.8 fL (ref 78.0–100.0)
Platelets: 158 10*3/uL (ref 150–400)
RBC: 2.6 MIL/uL — AB (ref 4.22–5.81)
RDW: 16 % — ABNORMAL HIGH (ref 11.5–15.5)
WBC: 5.5 10*3/uL (ref 4.0–10.5)

## 2015-06-03 LAB — IRON AND TIBC
IRON: 28 ug/dL — AB (ref 45–182)
Saturation Ratios: 18 % (ref 17.9–39.5)
TIBC: 155 ug/dL — ABNORMAL LOW (ref 250–450)
UIBC: 127 ug/dL

## 2015-06-03 LAB — VITAMIN B12: VITAMIN B 12: 253 pg/mL (ref 180–914)

## 2015-06-03 LAB — ABO/RH: ABO/RH(D): O POS

## 2015-06-03 LAB — PROTIME-INR
INR: 1.28 (ref 0.00–1.49)
Prothrombin Time: 16.1 seconds — ABNORMAL HIGH (ref 11.6–15.2)

## 2015-06-03 LAB — MAGNESIUM: MAGNESIUM: 2.3 mg/dL (ref 1.7–2.4)

## 2015-06-03 LAB — PREPARE RBC (CROSSMATCH)

## 2015-06-03 LAB — SEDIMENTATION RATE: SED RATE: 25 mm/h — AB (ref 0–16)

## 2015-06-03 LAB — TROPONIN I

## 2015-06-03 MED ORDER — SODIUM CHLORIDE 0.9 % IV SOLN: 80.0000 mg | Freq: Once | INTRAVENOUS | Status: DC

## 2015-06-03 MED ORDER — DILTIAZEM HCL 100 MG IV SOLR
INTRAVENOUS | Status: AC
  Administered 2015-06-03: 08:00:00
  Filled 2015-06-03: qty 100

## 2015-06-03 MED ORDER — METOPROLOL TARTRATE 5 MG/5ML IV SOLN: 2.5000 mg | INTRAVENOUS | Status: DC | PRN

## 2015-06-03 MED ORDER — SODIUM CHLORIDE 0.9 % IV BOLUS (SEPSIS)
500.0000 mL | Freq: Once | INTRAVENOUS | Status: AC
  Administered 2015-06-03: 500 mL via INTRAVENOUS

## 2015-06-03 MED ORDER — IOPAMIDOL (ISOVUE-300) INJECTION 61%
INTRAVENOUS | Status: AC
Start: 2015-06-03 — End: 2015-06-03
  Administered 2015-06-03: 5 mL
  Filled 2015-06-03: qty 100

## 2015-06-03 MED ORDER — MAGNESIUM SULFATE 2 GM/50ML IV SOLN
2.0000 g | Freq: Once | INTRAVENOUS | Status: AC
  Administered 2015-06-03: 2 g via INTRAVENOUS
  Filled 2015-06-03: qty 50

## 2015-06-03 MED ORDER — SODIUM CHLORIDE 0.9 % IV SOLN
Freq: Once | INTRAVENOUS | Status: AC
  Administered 2015-06-03: 14:00:00 via INTRAVENOUS

## 2015-06-03 MED ORDER — METOPROLOL TARTRATE 5 MG/5ML IV SOLN
INTRAVENOUS | Status: AC
  Filled 2015-06-03: qty 5

## 2015-06-03 MED ORDER — FENTANYL CITRATE (PF) 100 MCG/2ML IJ SOLN
INTRAMUSCULAR | Status: AC | PRN
  Administered 2015-06-03: 25 ug via INTRAVENOUS

## 2015-06-03 MED ORDER — FENTANYL CITRATE (PF) 100 MCG/2ML IJ SOLN
INTRAMUSCULAR | Status: AC
  Filled 2015-06-03: qty 2

## 2015-06-03 MED ORDER — IOPAMIDOL (ISOVUE-300) INJECTION 61%
INTRAVENOUS | Status: AC
  Administered 2015-06-03: 55 mL
  Filled 2015-06-03: qty 100

## 2015-06-03 MED ORDER — POTASSIUM CHLORIDE IN NACL 20-0.9 MEQ/L-% IV SOLN
INTRAVENOUS | Status: DC
Start: 2015-06-03 — End: 2015-06-06
  Administered 2015-06-03: 100 mL/h via INTRAVENOUS
  Administered 2015-06-04 – 2015-06-05 (×3): via INTRAVENOUS
  Filled 2015-06-03 (×7): qty 1000

## 2015-06-03 MED ORDER — SODIUM CHLORIDE 0.9 % IV SOLN
8.0000 mg/h | INTRAVENOUS | Status: AC
  Administered 2015-06-03 – 2015-06-05 (×6): 8 mg/h via INTRAVENOUS
  Filled 2015-06-03 (×15): qty 80

## 2015-06-03 MED ORDER — DILTIAZEM HCL 25 MG/5ML IV SOLN
10.0000 mg | Freq: Once | INTRAVENOUS | Status: AC
  Administered 2015-06-03: 10 mg via INTRAVENOUS
  Filled 2015-06-03: qty 5

## 2015-06-03 MED ORDER — MIDAZOLAM HCL 2 MG/2ML IJ SOLN
INTRAMUSCULAR | Status: AC | PRN
  Administered 2015-06-03: 0.5 mg via INTRAVENOUS

## 2015-06-03 MED ORDER — MIDAZOLAM HCL 2 MG/2ML IJ SOLN
INTRAMUSCULAR | Status: AC
  Filled 2015-06-03: qty 2

## 2015-06-03 MED ORDER — IOPAMIDOL (ISOVUE-300) INJECTION 61%
INTRAVENOUS | Status: AC
  Administered 2015-06-03: 70 mL
  Filled 2015-06-03: qty 100

## 2015-06-03 MED ORDER — SODIUM CHLORIDE 0.9% FLUSH
10.0000 mL | INTRAVENOUS | Status: DC | PRN
  Administered 2015-06-03: 10 mL

## 2015-06-03 MED ORDER — PANTOPRAZOLE SODIUM 40 MG IV SOLR: 40.0000 mg | Freq: Two times a day (BID) | INTRAVENOUS | Status: DC

## 2015-06-03 MED ORDER — LIDOCAINE HCL 1 % IJ SOLN
INTRAMUSCULAR | Status: AC
Start: 2015-06-03 — End: 2015-06-03
  Administered 2015-06-03: 8 mL
  Filled 2015-06-03: qty 20

## 2015-06-03 NOTE — Sedation Documentation (Signed)
Patient denies pain and is resting comfortably.  

## 2015-06-03 NOTE — Procedures (Signed)
Successful GDA COIL EMBOLIZATION for bleeding duodenal ulcer No comp Stable Full report in PACS

## 2015-06-03 NOTE — Progress Notes (Addendum)
PROGRESS NOTE  William Rivera [id number] DOB: 09-02-1935 DOA: 06/02/2015 PCP: [name], MD  Brief Narrative: 49 yom with past medical history of pancreatic cancer and colonoscopy 2015 that revealed mild sigmoid diverticulosis and internal hemorrhoids. Presented with complaints of bloody stools and LOC. William Rivera any injuries during LOC and reports passing dark marron colored stools and clots. Before arriving to [facility] he presented to [facility] for abdominal pain where he was cleared for release after unremarkable labs and CT scan. In the ED noted to be heme positive with Hgb 8.7. Hemoglobin dropped to 6.8. Patient was given 2 units of packed red blood cells and hemoglobin improved to 9.0. Patient transferred to [facility] stepdown unit. He developed SVT this morning with slow down with IV diltiazem.  Assessment/Plan: GI bleed with acute blood loss anemia. Hemodynamics are stable. Hemoglobin trending down. Appreciate GI consultation. Continue protonix  .Dr. Oneida Alar, patient has a large duodenal ulcer, she recommends transfer to [facility]. She has discussed the case with Dr. Paulita Fujita who recommends interventional radiology management if bleeding recurs. Dr. Oneida Alar has discussed the case with Dr. Reesa Chew of IR. There is no need for repeat endoscopy. The patient has rebleeding would need interventional radiology consultation for angiogram with embolization. Continue PPI drip  Acute blood loss anemia. Status post transfusion of 2 units of packed red blood cells. Repeat CBC shows hemoglobin has dropped from 9.0-7.7, patient will transfuse another 2 units of packed red blood cells today due to hemodynamic instability.  Acute kidney injury superimposed on chronic kidney disease stage III. Acute injury resolving with fluids.  Syncope secondary to vasovagal episode. Follow up orthostatic BP. Troponin negative. Found to be in SVT this morning which could be contributing to his symptoms in the  outpatient setting  SVT-converted to atrial fibrillation with IV Cardizem, continue prn IV metoprolol for rate control, patient receiving fluid boluses for low blood pressure, will also transfuse 2 units of packed red blood cells   Barretts esophagus, continue PPI.    Pancreatic carcinoma, no change noted in CT A/P with contrast 5/11. Recently seen by his oncologist at [facility] last month. Patient tolerated chemotherapy poorly and declined any further treatment. He had ERCP with metal stent placement back in February 2017 at [facility]. 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  Gout, stable.     DVT prophylaxis: SCDs Code Status: DO NOT RESUSCITATE  Family Communication: cold wife William Rivera and updated her about the patient's progress and his poor prognosis  Disposition  Plan Palliative care consultation      06/03/2015, 8:18 AM  LOS: 1 day   Consultants:  Gastroenterology   Procedures:  Transfuse 2U PRBC 5/12  Antimicrobials:  None  HPI/Subjective:  Patient in SVT with heart rate in the 160s, blood pressure in the 60s, awake and mentating  Objective: Filed Vitals:   06/03/15 0715 06/03/15 0720 06/03/15 0725 06/03/15 0730  BP: 63/41 67/41 68/40  61/51  Pulse: 183 179 81 77  Temp:      TempSrc:      Resp: 22 20 17 16   Height:      Weight:      SpO2: 99% 100% 97% 97%    Intake/Output Summary (Last 24 hours) at 06/03/15 0818 Last data filed at 06/03/15 0600  Gross per 24 hour  Intake 2060.58 ml  Output    950 ml  Net 1110.58 ml     Filed Weights   06/02/15 0518 06/02/15 0525  06/03/15 0414  Weight: 77.7 kg (171 lb 4.8 oz) 77.7 kg (171 lb 4.8 oz) 83 kg (182 lb 15.7 oz)    Exam: Constitutional:  . Appears calm and comfortable Eyes:  . PERRL and irises appear normal . Conjunctivae and lids appear normal ENMT:  . external ears, nose appear normal . grossly normal hearing  Respiratory:  . CTA bilaterally, no w/r/r.   . Respiratory effort normal. No retractions or accessory muscle use Cardiovascular:  . RRR, no m/r/g . No LE extremity edema   . Telemetry SR Abdomen:  . Abdomen appears normal; no tenderness or masses . No hernias Musculoskeletal:  . Normal RUE, LUE, RLE, LLE   o strength and tone normal, no atrophy, no abnormal movements Psychiatric:  . judgement and insight appear normal . Mental status o Mood, affect appropriate      Scheduled Meds: . sodium chloride   Intravenous Once  . diltiazem (CARDIZEM) infusion      . diltiazem  10 mg Intravenous Once  . feeding supplement  1 Container Oral TID WC  . magnesium sulfate 1 - 4 g bolus IVPB  2 g Intravenous Once  . metoprolol      . multivitamin with minerals  1 tablet Oral Daily  . [START ON 06/06/2015] pantoprazole (PROTONIX) IV  40 mg Intravenous Q12H  . sodium chloride  500 mL Intravenous Once  . sodium chloride flush  3 mL Intravenous Q12H   Continuous Infusions: . 0.9 % NaCl with KCl 20 mEq / L    . pantoprozole (PROTONIX) infusion      Principal Problem:   GI bleeding Active Problems:   Pancreatic cancer (HCC)   Acute blood loss anemia   AKI (acute kidney injury) ([place])   Syncope   Hematochezia   Protein-calorie malnutrition, severe   LOS: 1 day    Reyne Dumas MD  [phone]

## 2015-06-03 NOTE — Progress Notes (Signed)
Chief Complaint: Patient was seen in consultation today for GI bleed at the request of Dr. Reyne Dumas  Referring Physician(s): Dr. Reyne Dumas  Supervising Physician: Daryll Brod  Patient Status: In-pt  History of Present Illness: William Rivera is a 80 y.o. male with pancreatic cancer. He has had prior biliary stent placement. He developed a recent GI bleed, passing blood in his stools. No hematemesis. Endoscopy has revealed a large duodenal ulcer with visible vessel. He has continued to show signs of bleeding with hypotension, tachycardia, and his Hgb continues to trend down despite blood transfusions. IR is asked to eval for angiogram/embolization. Chart, PMHx, meds, labs, imaging, allergies reviewed. Family at bedside.  Past Medical History  Diagnosis Date  . Hypertension   . Gout   . Barrett's esophagus     last EGD/Bx 12/11  . Helicobacter pylori gastritis 2000    s/p treatment  . Adenomatous colon polyp 2010  . GERD (gastroesophageal reflux disease)   . GASTRITIS 09/23/2008    Qualifier: Diagnosis of  By: Nicole Kindred LPN, Doris    . Cancer of pancreas, other site AUG 2016    CA 19-9 958.6  . Pancreatic cancer ([place])   . Pancreatic cancer [facility])     Past Surgical History  Procedure Laterality Date  . Esophagogastroduodenoscopy  2008    DUODENITIS & GASTRITIS 2o TO NSAIDS/ETOH  . Colonoscopy  2010    simple adenomas  . Neck surgery      DISC REPLACED  . Bladder surgery  [id number]  . Sinus exploration  2000  . Tonsillectomy      AS A CHILD  . Esophagogastroduodenoscopy  12/2009    short segment Barrett's, small hh, chronic gastritis  . Nasal septum surgery    . Esophagogastroduodenoscopy  01/04/2011    [id number] gastritis/Barrett's, possible  . Esophagogastroduodenoscopy N/A 11/30/2012    Dr. Oneida Alar- normal esophagus, empiric dilation d/t c/o dysphagia/?cervical web, stomach= mild non erosive gastritis, inflammation on bx, duodenum= no abnormalities in the  bulb and second portion of the duodenum. dilation at the gastroesphageal junction.  Azzie Almas dilation N/A 11/30/2012    Procedure: SAVORY DILATION;  Surgeon: Danie Binder, MD;  Location: [facility] END[facility] SUITE;  Service: Endoscopy;  Laterality: N/A;  Venia Minks dilation N/A 11/30/2012    Procedure: Venia Minks DILATION;  Surgeon: Danie Binder, MD;  Location: [facility] ENDO SUITE;  Service: Endoscopy;  Laterality: N/A;  . 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 Colonoscopy N/A 07/28/2013    Dr. Oneida Alar: tubular adenoma, mild sigmoid colon diverticulosis, internal hemorhhoids   . Doppler echocardiography  2009  . Nm myoview ltd  2009  . Ercp N/A 09/14/2014    Dr. Laural Golden: sphincterotomy with 22 F plastic biliary stent placement   . Biliary stent placement N/A 09/14/2014    Procedure: BILIARY STENT PLACEMENT;  Surgeon: Rogene Houston, MD;  Location: [facility] ORS;  Service: Endoscopy;  Laterality: N/A;  . Sphincterotomy N/A 09/14/2014    Procedure: SPHINCTEROTOMY;  Surgeon: Rogene Houston, MD;  Location: [facility] ORS;  Service: Endoscopy;  Laterality: N/A;  . Esophagogastroduodenoscopy (egd) with propofol N/A 01/04/2015    RMR: partial uoddenal obstruction at junction of 1st and 2nd portion of duodenum, unale to intubate 2nd portion of duodenum to perform ERCP  . Ercp N/A 01/04/2015    Procedure: ATTEMPTED ENDOSCOPIC RETROGRADE CHOLANGIOPANCREATOGRAPHY (ERCP);  Surgeon: Daneil Dolin, MD;  Location: [facility] ORS;  Service: Endoscopy;  Laterality: N/A;  . Stents in bile ducts    .  Hemorrhoid surgery    . Ercp  02/2015    [facility]: biliary stent exchange from plastic to metal stent    Allergies: Doxycycline; Colestipol; and Doxycycline  Medications:  Current facility-administered medications:  .  0.9 %  sodium chloride infusion, , Intravenous, Once, Nayana Abrol, MD .  0.9 % NaCl with KCl 20 mEq/ L  infusion, , Intravenous, Continuous, Reyne Dumas, MD .  acetaminophen (TYLENOL) tablet 650 mg, 650 mg, Oral, Q6H PRN **OR**  acetaminophen (TYLENOL) suppository 650 mg, 650 mg, Rectal, Q6H PRN, Jani Gravel, MD .  feeding supplement (BOOST / RESOURCE BREEZE) liquid 1 Container, 1 Container, Oral, TID WC, Danie Binder, MD, 1 Container at 06/03/15 0902 .  HYDROmorphone (DILAUDID) injection 0.5 mg, 0.5 mg, Intravenous, Q4H PRN, Jani Gravel, MD .  magnesium sulfate IVPB 2 g 50 mL, 2 g, Intravenous, Once, Reyne Dumas, MD, 2 g at 06/03/15 0910 .  metoprolol (LOPRESSOR) injection 2.5 mg, 2.5 mg, Intravenous, Q4H PRN, Reyne Dumas, MD .  multivitamin with minerals tablet 1 tablet, 1 tablet, Oral, Daily, Samuella Cota, MD .  ondansetron [facility]) injection 4 mg, 4 mg, Intravenous, Q6H PRN, Jani Gravel, MD .  ondansetron [facility]) tablet 8 mg, 8 mg, Oral, Q8H PRN, Jani Gravel, MD .  oxyCODONE (Oxy IR/ROXICODONE) immediate release tablet 5 mg, 5 mg, Oral, Q4H PRN, Jani Gravel, MD, 5 mg at 06/02/15 0555 .  pantoprazole (PROTONIX) 80 mg in sodium chloride 0.9 % 250 mL (0.32 mg/mL) infusion, 8 mg/hr, Intravenous, Continuous, Reyne Dumas, MD, Last Rate: 25 mL/hr at 06/03/15 0745, 8 mg/hr at 06/03/15 0745 .  [START ON 06/06/2015] pantoprazole (PROTONIX) injection 40 mg, 40 mg, Intravenous, Q12H, Nayana Abrol, MD .  sodium chloride 0.9 % bolus 500 mL, 500 mL, Intravenous, Once, Reyne Dumas, MD .  sodium chloride flush (NS) 0.9 % injection 3 mL, 3 mL, Intravenous, Q12H, Jani Gravel, MD, 3 mL at 06/03/15 0542    Family History  Problem Relation Age of Onset  . Stomach cancer Father 73    deceased  . Colon cancer Neg Hx     Social History   Social History  . Marital Status: Married    Spouse Name: N/A  . Number of Children: 1  . Years of Education: N/A   Occupational History  . truck driver    Social History Main Topics  . Smoking status: Never Smoker   . Smokeless tobacco: Never Used     Comment: quit age 20 - 1 pack weekly   . Alcohol Use: No     Comment: former drinks about a pint of borboun per week  . Drug Use: No  .  Sexual Activity:    Partners: Female    Museum/gallery curator: None   Other Topics Concern  . None   Social History Narrative    Review of Systems: A 12 point ROS discussed and pertinent positives are indicated in the HPI above.  All other systems are negative.  Review of Systems  Vital Signs: BP 61/51 mmHg  Pulse 77  Temp(Src) 98.1 F (36.7 C) (Axillary)  Resp 16  Ht 5\' 9"  (1.753 m)  Wt 182 lb 15.7 oz (83 kg)  BMI 27.01 kg/m2  SpO2 97%  Physical Exam  Constitutional: He is oriented to person, place, and time. He appears well-developed and well-nourished. No distress.  HENT:  Head: Normocephalic.  Mouth/Throat: Oropharynx is clear and moist.  Neck: Normal range of motion. No tracheal deviation present.  Cardiovascular:  Normal rate, regular rhythm, normal heart sounds and intact distal pulses.   Pulmonary/Chest: Effort normal and breath sounds normal. No respiratory distress.  Abdominal: Soft. There is no tenderness.  Neurological: He is alert and oriented to person, place, and time.  Psychiatric: He has a normal mood and affect. Judgment normal.    Mallampati Score:  MD Evaluation Airway: WNL Heart: WNL Abdomen: WNL Chest/ Lungs: WNL ASA  Classification: 3 Mallampati/Airway Score: Two  Imaging: No results found.  Labs:  CBC:  Recent Labs  05/14/15 1137 06/02/15 0311 06/02/15 0804 06/02/15 1718 06/03/15 0542  WBC 6.5 9.6 7.7  --  5.5  HGB 10.8* 8.7* 6.8* 9.0* 7.7*  HCT 31.3* 25.8* 20.4* 26.3* 23.1*  PLT 166 305 213  --  158    COAGS:  Recent Labs  09/05/14 0530 09/06/14 1336 09/14/14 0717 06/02/15 0311  INR 1.03 1.12 1.10 1.33  APTT  --   --   --  31    BMP:  Recent Labs  02/19/15 0936 03/07/15 1705 05/14/15 1137 06/02/15 0311  NA 138 137 137 135  K 4.6 4.3 4.2 4.3  CL 104 105 106 105  CO2 20* 22 21* 25  GLUCOSE 150* 130* 161* 147*  BUN 22* 15 24* 21*  CALCIUM 8.9 8.6* 9.2 8.1*  CREATININE 1.72* 1.75* 1.93* 1.44*    GFRNONAA 37* 36* 32* 45*  GFRAA 42* 42* 37* 53*    LIVER FUNCTION TESTS:  Recent Labs  01/05/15 0513 02/19/15 0936 03/07/15 1705 06/02/15 0311  BILITOT 4.9* 3.8* 2.6* 0.4  AST 67* 114* 55* 20  ALT 73* 72* 45 15*  ALKPHOS 232* 117 193* 90  PROT 6.6 7.1 7.0 5.7*  ALBUMIN 2.5* 3.5 2.7* 2.8*    TUMOR MARKERS:  Recent Labs  09/15/14 0504  [id number] [id number]*    Assessment and Plan: GI bleed, presumably from Duodenal ulcer Plan for angiogram and likely empiric embolization of GDA Labs reviewed, mild AKI. Risks and Benefits discussed with the patient including, but not limited to bleeding, infection, vascular injury or contrast induced renal failure. All of the patient's questions were answered, patient is agreeable to proceed. Consent signed and in chart.   Thank you for this interesting consult.   A copy of this report was sent to the requesting provider on this date.  Electronically Signed: Ascencion Dike 06/03/2015, 10:03 AM   I spent a total of 20 minutes in face to face in clinical consultation, greater than 50% of which was counseling/coordinating care for mesenteric angiogram for GI bleed.

## 2015-06-03 NOTE — Progress Notes (Signed)
Subjective: No gross GI tract bleeding. Today had embolization of gastroduodenal artery.  Objective: Vital signs in last 24 hours: Temp:  [97.5 F (36.4 C)-98.6 F (37 C)] 98.6 F (37 C) (05/13 1100) Pulse Rate:  [77-183] 91 (05/13 1330) Resp:  [13-100] 21 (05/13 1330) BP: (61-152)/(40-79) 127/70 mmHg (05/13 1330) SpO2:  [21 %-100 %] 96 % (05/13 1330) Weight:  [83 kg (182 lb 15.7 oz)] 83 kg (182 lb 15.7 oz) (05/13 0414) Weight change: 8.157 kg (17 lb 15.7 oz) Last BM Date: 06/01/15  PE: GEN:  NAD SKIN:  Pale  Lab Results: CBC    Component Value Date/Time   WBC 5.5 06/03/2015 0542   RBC 2.60* 06/03/2015 0542   HGB 7.7* 06/03/2015 0542   HCT 23.1* 06/03/2015 0542   PLT 158 06/03/2015 0542   MCV 88.8 06/03/2015 0542   MCH 29.6 06/03/2015 0542   MCHC 33.3 06/03/2015 0542   RDW 16.0* 06/03/2015 0542   LYMPHSABS 1.1 06/02/2015 0311   MONOABS 0.7 06/02/2015 0311   EOSABS 0.0 06/02/2015 0311   BASOSABS 0.0 06/02/2015 0311   CMP     Component Value Date/Time   NA 135 06/02/2015 0311   K 4.3 06/02/2015 0311   CL 105 06/02/2015 0311   CO2 25 06/02/2015 0311   GLUCOSE 147* 06/02/2015 0311   BUN 21* 06/02/2015 0311   CREATININE 1.44* 06/02/2015 0311   CREATININE 1.73* 09/13/2014 0924   CALCIUM 8.1* 06/02/2015 0311   PROT 5.7* 06/02/2015 0311   ALBUMIN 2.8* 06/02/2015 0311   AST 20 06/02/2015 0311   ALT 15* 06/02/2015 0311   ALKPHOS 90 06/02/2015 0311   BILITOT 0.4 06/02/2015 0311   GFRNONAA 45* 06/02/2015 0311   GFRNONAA 38* 09/13/2014 0924   GFRAA 53* 06/02/2015 0311   GFRAA 43* 09/13/2014 0924   Assessment:  1.  Large duodenal ulcer with large visible vessel.  No active bleeding at the presen ttime.  Successful coil embolization of gastroduodenal artery today. 2.  Acute blood loss anemia, likely from #1 above. 3.  Pancreatic cancer with palliative metal biliary stent.  Plan:  1.  PPI drip for another 24 hours. 2.  Follow CBCs. 3.  Clear liquid diet. 4.   [facility] will follow.   Landry Dyke 06/03/2015, 2:25 PM   Pager [phone] If no answer or after 5 PM call [phone]

## 2015-06-04 DIAGNOSIS — Z515 Encounter for palliative care: Secondary | ICD-10-CM

## 2015-06-04 DIAGNOSIS — K2901 Acute gastritis with bleeding: Secondary | ICD-10-CM

## 2015-06-04 DIAGNOSIS — K264 Chronic or unspecified duodenal ulcer with hemorrhage: Secondary | ICD-10-CM | POA: Insufficient documentation

## 2015-06-04 LAB — CBC WITH DIFFERENTIAL/PLATELET
BASOS ABS: 0 10*3/uL (ref 0.0–0.1)
Basophils Relative: 0 %
EOS ABS: 0.2 10*3/uL (ref 0.0–0.7)
EOS PCT: 2 %
HCT: 31.2 % — ABNORMAL LOW (ref 39.0–52.0)
Hemoglobin: 10.3 g/dL — ABNORMAL LOW (ref 13.0–17.0)
LYMPHS PCT: 10 %
Lymphs Abs: 0.8 10*3/uL (ref 0.7–4.0)
MCH: 29.5 pg (ref 26.0–34.0)
MCHC: 33 g/dL (ref 30.0–36.0)
MCV: 89.4 fL (ref 78.0–100.0)
Monocytes Absolute: 0.8 10*3/uL (ref 0.1–1.0)
Monocytes Relative: 11 %
NEUTROS PCT: 77 %
Neutro Abs: 6 10*3/uL (ref 1.7–7.7)
PLATELETS: 181 10*3/uL (ref 150–400)
RBC: 3.49 MIL/uL — AB (ref 4.22–5.81)
RDW: 15.8 % — ABNORMAL HIGH (ref 11.5–15.5)
WBC: 7.8 10*3/uL (ref 4.0–10.5)

## 2015-06-04 LAB — COMPREHENSIVE METABOLIC PANEL
ALK PHOS: 89 U/L (ref 38–126)
ALT: 14 U/L — AB (ref 17–63)
AST: 17 U/L (ref 15–41)
Albumin: 2.1 g/dL — ABNORMAL LOW (ref 3.5–5.0)
Anion gap: 6 (ref 5–15)
BILIRUBIN TOTAL: 0.7 mg/dL (ref 0.3–1.2)
BUN: 9 mg/dL (ref 6–20)
CALCIUM: 8.1 mg/dL — AB (ref 8.9–10.3)
CO2: 23 mmol/L (ref 22–32)
CREATININE: 1.26 mg/dL — AB (ref 0.61–1.24)
Chloride: 111 mmol/L (ref 101–111)
GFR, EST NON AFRICAN AMERICAN: 53 mL/min — AB (ref >60)
Glucose, Bld: 102 mg/dL — ABNORMAL HIGH (ref 65–99)
Potassium: 4.6 mmol/L (ref 3.5–5.1)
SODIUM: 140 mmol/L (ref 135–145)
TOTAL PROTEIN: 4.6 g/dL — AB (ref 6.5–8.1)

## 2015-06-04 LAB — CBC
HCT: 20 % — ABNORMAL LOW (ref 39.0–52.0)
HEMATOCRIT: 29.1 % — AB (ref 39.0–52.0)
HEMOGLOBIN: 10.2 g/dL — AB (ref 13.0–17.0)
Hemoglobin: 6.7 g/dL — CL (ref 13.0–17.0)
MCH: 31.2 pg (ref 26.0–34.0)
MCH: 30.2 pg (ref 26.0–34.0)
MCHC: 35.1 g/dL (ref 30.0–36.0)
MCHC: 33.5 g/dL (ref 30.0–36.0)
MCV: 89 fL (ref 78.0–100.0)
MCV: 90.1 fL (ref 78.0–100.0)
PLATELETS: 116 10*3/uL — AB (ref 150–400)
Platelets: 161 10*3/uL (ref 150–400)
RBC: 3.27 MIL/uL — AB (ref 4.22–5.81)
RBC: 2.22 MIL/uL — AB (ref 4.22–5.81)
RDW: 15.9 % — ABNORMAL HIGH (ref 11.5–15.5)
RDW: 15.9 % — ABNORMAL HIGH (ref 11.5–15.5)
WBC: 7.3 10*3/uL (ref 4.0–10.5)
WBC: 5.7 10*3/uL (ref 4.0–10.5)

## 2015-06-04 LAB — TYPE AND SCREEN
ABO/RH(D): O POS
ANTIBODY SCREEN: NEGATIVE
Unit division: 0
Unit division: 0

## 2015-06-04 MED ORDER — TRAZODONE HCL 50 MG PO TABS
25.0000 mg | ORAL_TABLET | Freq: Once | ORAL | Status: AC
  Administered 2015-06-04: 25 mg via ORAL
  Filled 2015-06-04: qty 1

## 2015-06-04 NOTE — Consult Note (Signed)
Consultation Note Date: 06/04/2015   Patient Name: William Rivera  DOB: 08-23-1935  MRN: [id number]  Age / Sex: 80 y.o., male  PCP: William Sites, MD Referring Physician: Reyne Dumas, MD  Reason for Consultation: Establishing goals of care  HPI/Patient Profile: 80 y.o. male  with past medical history of Pancreatic cancer diagnosed in August 2016 with a history of chemotherapy and radiation and metastatic disease to the lungs, A. fib, Barrett's esophagus, gout, chronic kidney disease stage III, admitted on 06/02/2015 with complaints of bloody stools and change in level of consciousness. Patient initially presented to [facility] with complaints of abdominal pain where he was cleared for release after an unremarkable CT scan and labs. Patient has been transfused a total of 4 units of blood. His stools were heme positive, with a hemoglobin of 6.8. He was hypotensive. He he was found to have a large bleeding duodenal ulcer. He underwent coiling of duodenal ulcer on 06/03/2015. Marland Kitchen   Clinical Assessment and Goals of Care: Per patient and spouse he is much improved today. He is seen sitting in his chair in the room and has been ambulating without assistance within the room. He denies any shortness of breath. He is also eating without any abdominal pain. He still reports seen some blood in his stool which she describes as more dark and tarry at this point. His repeat hemoglobin today is 10.2. He has not utilized any pain medicine since coiling procedure and denies any pain presently  Patient at this point can make his own decisions however in the event that he could no longer speak for himself his wife William Rivera would speak for him. He is a DO NOT RESUSCITATE.    SUMMARY OF RECOMMENDATIONS   Continue DNR/DNI Patient does not want further chemotherapy however he would be interested in other treatment modalities to  extend his life other than chemotherapy. He will follow-up with his oncologist at [place] concepts of palliative care versus hospice care Palliative medicine to continue to follow patient while he is in the hospital and observe for rebleeding and need to facilitate further goals of care discussion and/or symptom management Code Status/Advance Care Planning:  DNR    Symptom Management:   Pain: Patient has available oxycodone 5 mg by mouth every 4 hours as needed as well as Dilaudid 0.5 mg IV every 4 as needed. Continue with this plan and monitor for need for changing doses or scheduled medications but at this point patient is denying any pain after coiling procedure  Dyspnea: No further reports of dyspnea after transfusion and duodenal ulcer coiling procedure  Palliative Prophylaxis:   Aspiration, Bowel Regimen, Frequent Pain Assessment, Oral Care and Turn Reposition  Additional Recommendations (Limitations, Scope, Preferences):  No Chemotherapy  Psycho-social/Spiritual:   Desire for further Chaplaincy support:no  Additional Recommendations: Grief/Bereavement Support  Prognosis:   < 6 months barring an acute event such as we bleeding from duodenal ulcer.  Discharge Planning: Home with outpatient oncology management at [facility]  [facility]     Primary Diagnoses: Present on Admission:  . (Resolved) GI bleed . Pancreatic cancer ([place]) . (Resolved) Sinus tachycardia ([place]) . GI bleeding  I have reviewed the medical record, interviewed the patient and family, and examined the patient. The following aspects are pertinent.  Past Medical History  Diagnosis Date  . Hypertension   . Gout   . Barrett's esophagus     last EGD/Bx 12/11  . Helicobacter pylori gastritis 2000    s/p treatment  . Adenomatous colon polyp 2010  . GERD (gastroesophageal reflux disease)   . GASTRITIS 09/23/2008    Qualifier: Diagnosis of  By: William Kindred LPN, Doris      . Cancer of pancreas, other site AUG 2016    CA 19-9 958.6  . Pancreatic cancer ([place])   . Pancreatic cancer [facility])    Social History   Social History  . Marital Status: Married    Spouse Name: N/A  . Number of Children: 1  . Years of Education: N/A   Occupational History  . truck driver    Social History Main Topics  . Smoking status: Never Smoker   . Smokeless tobacco: Never Used     Comment: quit age 46 - 1 pack weekly   . Alcohol Use: No     Comment: former drinks about a pint of borboun per week  . Drug Use: No  . Sexual Activity:    Partners: Female    Museum/gallery curator: None   Other Topics Concern  . None   Social History Narrative   Family History  Problem Relation Age of Onset  . Stomach cancer Father 11    deceased  . Colon cancer Neg Hx    Scheduled Meds: . feeding supplement  1 Container Oral TID WC  . multivitamin with minerals  1 tablet Oral Daily  . [START ON 06/06/2015] pantoprazole (PROTONIX) IV  40 mg Intravenous Q12H  . sodium chloride flush  3 mL Intravenous Q12H   Continuous Infusions: . 0.9 % NaCl with KCl 20 mEq / L 100 mL/hr at 06/04/15 1200  . pantoprozole (PROTONIX) infusion 8 mg/hr (06/04/15 1200)   PRN Meds:.acetaminophen **OR** acetaminophen, HYDROmorphone (DILAUDID) injection, metoprolol, ondansetron (ZOFRAN) IV, ondansetron, oxyCODONE, sodium chloride flush Medications Prior to Admission:  Prior to Admission medications   Medication Sig Start Date End Date Taking? Authorizing Provider  Artificial Tear Ointment (DRY EYES OP) Apply 1-2 drops to eye daily as needed (dry eyes).   Yes Historical Provider, MD  cetirizine-pseudoephedrine (ZYRTEC-D) 5-120 MG tablet Take 1 tablet by mouth daily as needed for allergies.   Yes Historical Provider, MD  CREON 24000 units CPEP Take 2 capsules by mouth 4 (four) times daily. Take 2 capsules with meals and 1 capsule with snacks. 05/15/15  Yes Historical Provider, MD  HYDROcodone-acetaminophen  (NORCO/VICODIN) 5-325 MG tablet Take 1 tablet by mouth every 4 (four) hours as needed for moderate pain.    Yes Historical Provider, MD  ondansetron (ZOFRAN) 8 MG tablet Take 8 mg by mouth every 8 (eight) hours as needed for nausea or vomiting.  10/06/14  Yes Historical Provider, MD  pantoprazole (PROTONIX) 40 MG tablet Take 40 mg by mouth daily as needed (acid reflux).    Yes Historical Provider, MD  ranitidine (ZANTAC 150 MAXIMUM STRENGTH) 150 MG tablet Take 150 mg by mouth as needed for heartburn.   Yes Historical Provider, MD  simethicone (MYLICON) 0000000 MG chewable tablet Chew 125 mg by mouth every  6 (six) hours as needed for flatulence.   Yes Historical Provider, MD  sodium chloride (OCEAN) 0.65 % SOLN nasal spray Place 1 spray into both nostrils as needed for congestion.   Yes Historical Provider, MD   Allergies  Allergen Reactions  . Doxycycline Itching    Severe itching   . Colestipol Itching  . Doxycycline Rash   Review of Systems  Constitutional: Positive for activity change, appetite change and fatigue.  HENT: Negative.   Eyes: Negative.   Respiratory: Positive for shortness of breath.   Cardiovascular: Positive for palpitations.  Gastrointestinal: Positive for nausea, abdominal pain and blood in stool.  Endocrine: Positive for cold intolerance.  Genitourinary: Negative.   Musculoskeletal: Negative.   Skin: Positive for pallor.  Allergic/Immunologic: Negative.   Neurological: Positive for syncope, weakness and light-headedness.  Hematological: Bruises/bleeds easily.  Psychiatric/Behavioral: Negative.     Physical Exam  Constitutional: He is oriented to person, place, and time. He appears well-nourished.  HENT:  Head: Normocephalic and atraumatic.  Neck: Normal range of motion.  Pulmonary/Chest: Effort normal.  Musculoskeletal: Normal range of motion.  Neurological: He is alert and oriented to person, place, and time.  Skin: Skin is warm and dry.  Psychiatric: He has a  normal mood and affect.  Nursing note and vitals reviewed.   Vital Signs: BP 122/69 mmHg  Pulse 90  Temp(Src) 98.5 F (36.9 C) (Oral)  Resp 18  Ht 5\' 9"  (1.753 m)  Wt 83 kg (182 lb 15.7 oz)  BMI 27.01 kg/m2  SpO2 98% Pain Assessment: No/denies pain POSS *See Group Information*: 1-Acceptable,Awake and alert Pain Score: 0-No pain   SpO2: SpO2: 98 % O2 Device:SpO2: 98 % O2 Flow Rate: .O2 Flow Rate (L/min): 1.5 L/min  IO: Intake/output summary:  Intake/Output Summary (Last 24 hours) at 06/04/15 1550 Last data filed at 06/04/15 1200  Gross per 24 hour  Intake 4382.83 ml  Output      2 ml  Net 4380.83 ml    LBM: Last BM Date: 06/04/15 Baseline Weight: Weight: 74.844 kg (165 lb) Most recent weight: Weight: 83 kg (182 lb 15.7 oz)     Palliative Assessment/Data:   Flowsheet Rows        Most Recent Value   Intake Tab    Referral Department  Hospitalist   Unit at Time of Referral  Intermediate Care Unit   Palliative Care Primary Diagnosis  Other (Comment)   Date Notified  06/03/15   Palliative Care Type  New Palliative care   Reason for referral  Clarify Goals of Care   Date of Admission  06/02/15   Date first seen by Palliative Care  06/04/15   # of days Palliative referral response time  1 Day(s)   # of days IP prior to Palliative referral  1   Clinical Assessment    Palliative Performance Scale Score  40%   Pain Max last 24 hours  0   Pain Min Last 24 hours  0   Dyspnea Max Last 24 Hours  0   Dyspnea Min Last 24 hours  0   Nausea Max Last 24 Hours  0   Nausea Min Last 24 Hours  0   Anxiety Max Last 24 Hours  0   Anxiety Min Last 24 Hours  0   Psychosocial & Spiritual Assessment    Palliative Care Outcomes       Time In: 1030 Time Out: 1130 Time Total: 60 min Greater than 50%  of  this time was spent counseling and coordinating care related to the above assessment and plan. Staffed with Dr. Allyson Sabal Signed by: Dory Horn, NP   Please contact  Palliative Medicine Team phone at [phone] for questions and concerns.  For individual provider: See Shea Evans

## 2015-06-04 NOTE — Progress Notes (Signed)
Subjective: Couple bloody stools this morning. No abdominal pain.  Objective: Vital signs in last 24 hours: Temp:  [97.5 F (36.4 C)-98.5 F (36.9 C)] 98.5 F (36.9 C) (05/14 1135) Pulse Rate:  [83-101] 90 (05/14 1200) Resp:  [18-34] 18 (05/14 1200) BP: (105-137)/(53-77) 122/69 mmHg (05/14 1200) SpO2:  [95 %-100 %] 98 % (05/14 1200) Weight change:  Last BM Date: 06/04/15  PE: GEN:  Less pale-appearing ABD:  Soft, non-tender  Lab Results: CBC    Component Value Date/Time   WBC 7.3 06/04/2015 0600   RBC 3.27* 06/04/2015 0600   HGB 10.2* 06/04/2015 0600   HCT 29.1* 06/04/2015 0600   PLT 161 06/04/2015 0600   MCV 89.0 06/04/2015 0600   MCH 31.2 06/04/2015 0600   MCHC 35.1 06/04/2015 0600   RDW 15.9* 06/04/2015 0600   LYMPHSABS 1.1 06/02/2015 0311   MONOABS 0.7 06/02/2015 0311   EOSABS 0.0 06/02/2015 0311   BASOSABS 0.0 06/02/2015 0311   CMP     Component Value Date/Time   NA 140 06/04/2015 0600   K 4.6 06/04/2015 0600   CL 111 06/04/2015 0600   CO2 23 06/04/2015 0600   GLUCOSE 102* 06/04/2015 0600   BUN 9 06/04/2015 0600   CREATININE 1.26* 06/04/2015 0600   CREATININE 1.73* 09/13/2014 0924   CALCIUM 8.1* 06/04/2015 0600   PROT 4.6* 06/04/2015 0600   ALBUMIN 2.1* 06/04/2015 0600   AST 17 06/04/2015 0600   ALT 14* 06/04/2015 0600   ALKPHOS 89 06/04/2015 0600   BILITOT 0.7 06/04/2015 0600   GFRNONAA 53* 06/04/2015 0600   GFRNONAA 38* 09/13/2014 0924   GFRAA >60 06/04/2015 0600   GFRAA 43* 09/13/2014 0924   Studies/Results: GDA coil embolization yesterday with Interventional Radiology  Assessment:  1.  Large duodenal ulcer with large visible vessel.  Embolization yesterday of GDA done by Interventional Radiology. 2.  GI bleeding, hopefully old blood.  3.  Anemia.  Improving after transfusion. 4.  Pancreatic cancer, with palliative metal biliary wallstent placement performed.  Plan:  1.  Continue PPI drip today, maybe change to IV/PO BID regimen  tomorrow. 2.  Clear liquid diet only. 3.  [facility] will revisit tomorrow; if bleeding persists, might need to consider repeat endoscopy to reassess duodenal ulcer with large visible vessel, last observed via endoscopy with Dr Oneida Alar Friday 06/02/15.   Landry Dyke 06/04/2015, 12:51 PM   Pager [phone] If no answer or after 5 PM call [phone]

## 2015-06-04 NOTE — Progress Notes (Signed)
Referring Physician(s): Dr. Reyne Dumas  Supervising Physician: Daryll Brod  Patient Status: In-pt  Chief Complaint: GI bleed  Subjective: S/p mesenteric angio with successful GDA embolization Pt feeling and looking better this am. No pain. Able to tolerate po clears so far without pain. Had bloody BM this am, but less than yesterday  Allergies: Doxycycline; Colestipol; and Doxycycline  Medications:  Current facility-administered medications:  .  0.9 % NaCl with KCl 20 mEq/ L  infusion, , Intravenous, Continuous, Reyne Dumas, MD, Last Rate: 100 mL/hr at 06/04/15 0900 .  acetaminophen (TYLENOL) tablet 650 mg, 650 mg, Oral, Q6H PRN **OR** acetaminophen (TYLENOL) suppository 650 mg, 650 mg, Rectal, Q6H PRN, Jani Gravel, MD .  feeding supplement (BOOST / RESOURCE BREEZE) liquid 1 Container, 1 Container, Oral, TID WC, Danie Binder, MD, 1 Container at 06/03/15 0902 .  HYDROmorphone (DILAUDID) injection 0.5 mg, 0.5 mg, Intravenous, Q4H PRN, Jani Gravel, MD .  metoprolol (LOPRESSOR) injection 2.5 mg, 2.5 mg, Intravenous, Q4H PRN, Reyne Dumas, MD .  multivitamin with minerals tablet 1 tablet, 1 tablet, Oral, Daily, Samuella Cota, MD .  ondansetron [facility]) injection 4 mg, 4 mg, Intravenous, Q6H PRN, Jani Gravel, MD .  ondansetron [facility]) tablet 8 mg, 8 mg, Oral, Q8H PRN, Jani Gravel, MD .  oxyCODONE (Oxy IR/ROXICODONE) immediate release tablet 5 mg, 5 mg, Oral, Q4H PRN, Jani Gravel, MD, 5 mg at 06/02/15 0555 .  pantoprazole (PROTONIX) 80 mg in sodium chloride 0.9 % 250 mL (0.32 mg/mL) infusion, 8 mg/hr, Intravenous, Continuous, Reyne Dumas, MD, Last Rate: 25 mL/hr at 06/04/15 0900, 8 mg/hr at 06/04/15 0900 .  [START ON 06/06/2015] pantoprazole (PROTONIX) injection 40 mg, 40 mg, Intravenous, Q12H, Nayana Abrol, MD .  sodium chloride flush (NS) 0.9 % injection 10-40 mL, 10-40 mL, Intracatheter, PRN, Reyne Dumas, MD, 10 mL at 06/03/15 2040 .  sodium chloride flush (NS) 0.9 % injection  3 mL, 3 mL, Intravenous, Q12H, Jani Gravel, MD, 3 mL at 06/04/15 0809    Vital Signs: BP 133/77 mmHg  Pulse 88  Temp(Src) 98.4 F (36.9 C) (Oral)  Resp 21  Ht 5\' 9"  (1.753 m)  Wt 182 lb 15.7 oz (83 kg)  BMI 27.01 kg/m2  SpO2 95%  Physical Exam Abd: soft, NT Ext: (R)groin soft, NT, no hematoma   Imaging:  Ir Angiogram Visceral Selective  06/03/2015  INDICATION: GI bleeding duodenal ulcer confirmed by endoscopy EXAM: IR ULTRASOUND GUIDANCE VASC ACCESS RIGHT; SELECTIVE VISCERAL ARTERIOGRAPHY; ARTERIOGRAPHY; ADDITIONAL ARTERIOGRAPHY; IR EMBO ART VEN HEMORR LYMPH EXTRAV INC GUIDE ROADMAPPING MEDICATIONS: None. ANESTHESIA/SEDATION: Moderate (conscious) sedation was employed during this procedure. A total of Versed 0.5 mg and Fentanyl 25 mcg was administered intravenously. Moderate Sedation Time: 50 minutes. The patient's level of consciousness and vital signs were monitored continuously by radiology nursing throughout the procedure under my direct supervision. CONTRAST:  130 cc Omnipaque 3 under FLUOROSCOPY TIME:  Fluoroscopy Time: 9 minutes 30 seconds (947 mGy). COMPLICATIONS: None immediate. PROCEDURE: Informed consent was obtained from the patient following explanation of the procedure, risks, benefits and alternatives. The patient understands, agrees and consents for the procedure. All questions were addressed. A time out was performed prior to the initiation of the procedure. Maximal barrier sterile technique utilized including caps, mask, sterile gowns, sterile gloves, large sterile drape, hand hygiene, and Betadine prep. Under sterile conditions and local anesthesia, ultrasound micropuncture access performed of the patent right common femoral artery. Five French sheath inserted. C2 catheter advanced into the aorta.  Over a Glidewire, celiac origin was selected. Celiac angiogram: Celiac origin is widely patent. The left gastric, splenic and hepatic vasculature are all patent. Catheter was  advanced over a Glidewire into the common hepatic artery. Common hepatic angiogram performed. Common hepatic angiogram: Common, proper, right and left hepatic arteries are patent. Origin of the GDA is visualized and appears partially occluded proximally. Renegade micro catheter advanced through the C2 catheter into the proximal GDA. GDA angiogram: Contrast injection demonstrates extravasation of contrast into the duodenum compatible with a bleeding duodenal ulcer by endoscopy. Micro catheter was retracted back into the irregular eroded appearing gastroduodenal artery. Coil embolization performed of the GDA. Embolization: Through the Renegade catheter, micro coil embolization performed of the proximal GDA by deploying 1 3 mm x 6 mm and 2 4 mm x 4 cm microcoils. Post embolization angiogram confirms complete stasis of the proximal GDA. No further active bleeding demonstrated. Micro catheter removed. C2 catheter retracted and utilized to select the SMA. SMA angiogram performed. SMA angiogram: SMA is widely patent. Jejunal and colic branches are patent. No active bleeding demonstrated from the inferior pancreaticoduodenal arcade. Right common femoral artery sheath injected. This confirms access within the common femoral artery. Hemostasis obtained with the Exoseal device. No immediate complication. Patient tolerated the procedure well. IMPRESSION: Active bleeding from the large duodenal ulcer eroding into the gastroduodenal artery. Successful micro coil embolization of the proximal GDA with 3 and 4 mm interlock micro coils as above. Electronically Signed   By: Jerilynn Mages.  Shick M.D.   On: 06/03/2015 14:27    Labs:  CBC:  Recent Labs  06/02/15 0311 06/02/15 0804 06/02/15 1718 06/03/15 0542 06/04/15 0600  WBC 9.6 7.7  --  5.5 7.3  HGB 8.7* 6.8* 9.0* 7.7* 10.2*  HCT 25.8* 20.4* 26.3* 23.1* 29.1*  PLT 305 213  --  158 161    COAGS:  Recent Labs  09/06/14 1336 09/14/14 0717 06/02/15 0311 06/03/15 1335  INR  1.12 1.10 1.33 1.28  APTT  --   --  31  --     BMP:  Recent Labs  05/14/15 1137 06/02/15 0311 06/03/15 1335 06/04/15 0600  NA 137 135 140 140  K 4.2 4.3 4.8 4.6  CL 106 105 110 111  CO2 21* 25 24 23   GLUCOSE 161* 147* 111* 102*  BUN 24* 21* 12 9  CALCIUM 9.2 8.1* 8.4* 8.1*  CREATININE 1.93* 1.44* 1.36* 1.26*  GFRNONAA 32* 45* 49* 53*  GFRAA 37* 53* 56* >60    LIVER FUNCTION TESTS:  Recent Labs  03/07/15 1705 06/02/15 0311 06/03/15 1335 06/04/15 0600  BILITOT 2.6* 0.4 0.4 0.7  AST 55* 20 21 17   ALT 45 15* 13* 14*  ALKPHOS 193* 90 93 89  PROT 7.0 5.7* 4.9* 4.6*  ALBUMIN 2.7* 2.8* 2.3* 2.1*    Assessment and Plan: S/p successful GDA coil embo for bleeding DU Looks good this am. Hemodynamically stable, Hgb up to 10. Monitor for signs of recurrent bleed  Electronically Signed: Ascencion Dike 06/04/2015, 9:44 AM   I spent a total of 15 Minutes at the the patient's bedside AND on the patient's hospital floor or unit, greater than 50% of which was counseling/coordinating care for GI bleed

## 2015-06-04 NOTE — Progress Notes (Signed)
PROGRESS NOTE  William Rivera [id number] DOB: 03/04/1935 DOA: 06/02/2015 PCP: William Kilts, MD  Brief Narrative: 49 yom with past medical history of pancreatic cancer and colonoscopy 2015 that revealed mild sigmoid diverticulosis and internal hemorrhoids. Presented with complaints of bloody stools and LOC. [name] any injuries during LOC and reports passing dark marron colored stools and clots. Before arriving to [facility] he presented to [facility] for abdominal pain where he was cleared for release after unremarkable labs and CT scan. In the ED noted to be heme positive with Hgb 8.7. Hemoglobin dropped to 6.8. Patient was given 2 units of packed red blood cells and hemoglobin improved to 9.0. Patient transferred to [facility] stepdown unit. He developed SVT this morning with slow down with IV diltiazem.  Assessment/Plan: GI bleed with acute blood loss anemia. Hemoglobin dropped from 9.0-7.7, now 10.2. Patient has received a total of 4 units this admission. Appreciate GI consultation. Continue protonix  .Dr. Oneida Rivera, patient has a large duodenal ulcer, she recommends transfer to [facility]. She has discussed the case with Dr. Paulita Rivera who recommends interventional radiology management if bleeding recurs. Dr. Oneida Rivera has discussed the case with Dr. Reesa Rivera of IR. There is no need for repeat endoscopy.  Successful coil embolization of gastroduodenal artery today. Continue PPI drip, continue to follow CBC,  Acute blood loss anemia. Status post transfusion of 4  units of packed red blood cells.    Acute kidney injury superimposed on chronic kidney disease stage III. Improving with volume resuscitation   Syncope secondary to vasovagal episode. Follow up orthostatic BP. Troponin negative. Found to be in SVT 5/13  due to ongoing bleeding. Converted with electrolyte repletion and volume resuscitation  SVT-converted to atrial fibrillation and subsequently sinus rhythm with IV Cardizem, continue prn IV  metoprolol for rate control,   Barretts esophagus, continue PPI.    Pancreatic carcinoma, no change noted in CT A/P with contrast 5/11. Recently seen by his oncologist at [facility] last month. Patient tolerated chemotherapy poorly and declined any further treatment. He had ERCP with metal stent placement back in February 2017 at [facility]. 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  Gout, stable.     DVT prophylaxis: SCDs Code Status: DO NOT RESUSCITATE  Family Communication: With wife William Rivera by the bedside   Disposition  Plan Palliative care consultation, continue monitoring and stepdown if stable anticipate discharge tomorrow      06/04/2015, 9:28 AM  LOS: 2 days   Consultants:  Gastroenterology   Procedures:  Transfuse 2U PRBC 5/12 , 2 units 5/13  Antimicrobials:  None  HPI/Subjective: Patient seen and examined in the presence of his wife, hemodynamically stable in sinus rhythm overnight  Objective: Filed Vitals:   06/03/15 2000 06/04/15 0000 06/04/15 0400 06/04/15 0753  BP: 122/67 114/66 128/77 133/77  Pulse: 88 91 90 88  Temp:  98.4 F (36.9 C) 97.8 F (36.6 C) 98.4 F (36.9 C)  TempSrc:  Oral Axillary Oral  Resp: 21 21 20 21   Height:      Weight:      SpO2: 97% 98% 98% 95%    Intake/Output Summary (Last 24 hours) at 06/04/15 0928 Last data filed at 06/04/15 0753  Gross per 24 hour  Intake 3753.64 ml  Output    702 ml  Net 3051.64 ml     Filed Weights   06/02/15 0518 06/02/15 0525 06/03/15 0414  Weight: 77.7 kg (171 lb 4.8 oz) 77.7 kg (171  lb 4.8 oz) 83 kg (182 lb 15.7 oz)    Exam: Constitutional:  . Appears calm and comfortable Eyes:  . PERRL and irises appear normal . Conjunctivae and lids appear normal ENMT:  . external ears, nose appear normal . grossly normal hearing  Respiratory:  . CTA bilaterally, no w/r/r.  . Respiratory effort normal. No retractions or accessory muscle use Cardiovascular:   . RRR, no m/r/g . No LE extremity edema   . Telemetry SR Abdomen:  . Abdomen appears normal; no tenderness or masses . No hernias Musculoskeletal:  . Normal RUE, LUE, RLE, LLE   o strength and tone normal, no atrophy, no abnormal movements Psychiatric:  . judgement and insight appear normal . Mental status o Mood, affect appropriate      Scheduled Meds: . feeding supplement  1 Container Oral TID WC  . multivitamin with minerals  1 tablet Oral Daily  . [START ON 06/06/2015] pantoprazole (PROTONIX) IV  40 mg Intravenous Q12H  . sodium chloride flush  3 mL Intravenous Q12H   Continuous Infusions: . 0.9 % NaCl with KCl 20 mEq / L 100 mL/hr at 06/04/15 0753  . pantoprozole (PROTONIX) infusion 8 mg/hr (06/04/15 [id number])    Principal Problem:   GI bleeding Active Problems:   Pancreatic cancer ([facility])   Acute blood loss anemia   AKI (acute kidney injury) ([place])   Syncope   Hematochezia   Protein-calorie malnutrition, severe   LOS: 2 days    William Dumas MD  [phone]

## 2015-06-05 LAB — CBC
HEMATOCRIT: 29.8 % — AB (ref 39.0–52.0)
HEMATOCRIT: 34.3 % — AB (ref 39.0–52.0)
HEMOGLOBIN: 11.6 g/dL — AB (ref 13.0–17.0)
Hemoglobin: 10.2 g/dL — ABNORMAL LOW (ref 13.0–17.0)
MCH: 30.5 pg (ref 26.0–34.0)
MCH: 30.4 pg (ref 26.0–34.0)
MCHC: 34.2 g/dL (ref 30.0–36.0)
MCHC: 33.8 g/dL (ref 30.0–36.0)
MCV: 89.2 fL (ref 78.0–100.0)
MCV: 90 fL (ref 78.0–100.0)
PLATELETS: 184 10*3/uL (ref 150–400)
Platelets: 194 10*3/uL (ref 150–400)
RBC: 3.34 MIL/uL — ABNORMAL LOW (ref 4.22–5.81)
RBC: 3.81 MIL/uL — ABNORMAL LOW (ref 4.22–5.81)
RDW: 15.4 % (ref 11.5–15.5)
RDW: 15.6 % — AB (ref 11.5–15.5)
WBC: 6 10*3/uL (ref 4.0–10.5)
WBC: 6.8 10*3/uL (ref 4.0–10.5)

## 2015-06-05 LAB — COMPREHENSIVE METABOLIC PANEL
ALBUMIN: 2.2 g/dL — AB (ref 3.5–5.0)
ALT: 12 U/L — ABNORMAL LOW (ref 17–63)
ANION GAP: 4 — AB (ref 5–15)
AST: 18 U/L (ref 15–41)
Alkaline Phosphatase: 95 U/L (ref 38–126)
BILIRUBIN TOTAL: 0.6 mg/dL (ref 0.3–1.2)
BUN: 6 mg/dL (ref 6–20)
CHLORIDE: 110 mmol/L (ref 101–111)
CO2: 24 mmol/L (ref 22–32)
Calcium: 8.3 mg/dL — ABNORMAL LOW (ref 8.9–10.3)
Creatinine, Ser: 1.21 mg/dL (ref 0.61–1.24)
GFR calc Af Amer: >60 mL/min (ref >60)
GFR calc non Af Amer: 56 mL/min — ABNORMAL LOW (ref >60)
GLUCOSE: 111 mg/dL — AB (ref 65–99)
Potassium: 4.2 mmol/L (ref 3.5–5.1)
SODIUM: 138 mmol/L (ref 135–145)
TOTAL PROTEIN: 4.9 g/dL — AB (ref 6.5–8.1)

## 2015-06-05 LAB — FOLATE RBC
FOLATE, HEMOLYSATE: 327.5 ng/mL
Folate, RBC: 1424 ng/mL (ref >498)
HEMATOCRIT: 23 % — AB (ref 37.5–51.0)

## 2015-06-05 MED ORDER — POLYETHYLENE GLYCOL 3350 17 G PO PACK
17.0000 g | PACK | Freq: Three times a day (TID) | ORAL | Status: DC
  Administered 2015-06-05 (×3): 17 g via ORAL
  Filled 2015-06-05 (×3): qty 1

## 2015-06-05 MED ORDER — PANTOPRAZOLE SODIUM 40 MG PO TBEC: 40.0000 mg | DELAYED_RELEASE_TABLET | Freq: Two times a day (BID) | ORAL | Status: DC

## 2015-06-05 MED ORDER — ALTEPLASE 2 MG IJ SOLR
2.0000 mg | Freq: Once | INTRAMUSCULAR | Status: AC
  Administered 2015-06-05: 2 mg
  Filled 2015-06-05: qty 2

## 2015-06-05 MED ORDER — HYDROCODONE-ACETAMINOPHEN 5-325 MG PO TABS: 1.0000 | ORAL_TABLET | ORAL | Status: DC | PRN

## 2015-06-05 MED ORDER — BOOST / RESOURCE BREEZE PO LIQD: 1.0000 | Freq: Three times a day (TID) | ORAL | Status: DC

## 2015-06-05 NOTE — Progress Notes (Signed)
Called to assess neither left chest dual port PAC.  Staff unable to draw blood from either port.  (Lateral & Medial).   Both ports flushed easily for me however no blood return from either port.   Both ports just accessed this morning by IV Team so have ordered Cathflo for port that Protonix is not infusing thru.  Pt states,  "This happens sometimes at the [facility] too".

## 2015-06-05 NOTE — Progress Notes (Signed)
[facility] PROGRESS NOTE Subjective patient with a history of pancreatic cancer followed it [facility]. Diagnosed 8/16 and has received chemotherapy and radiation for non-resectable pancreatic cancer with metastatic disease. He has a biliary stent in place and presented to [facility] with acute G.I. bleed underwent EGD by Dr Oneida Alar 5/12 with a massively bleeding duodenal ulcer. Transferred to [facility]. Was embolized by IR yesterday and has done well but had a large melenic stools today and discharge was canceled.  Objective: Vital signs in last 24 hours: Temp:  [98.3 F (36.8 C)-98.7 F (37.1 C)] 98.6 F (37 C) (05/15 0800) Pulse Rate:  [89-105] 89 (05/15 0800) Resp:  [18-29] 24 (05/14 2315) BP: (118-144)/(69-85) 120/69 mmHg (05/15 0800) SpO2:  [91 %-98 %] 95 % (05/15 0800) Last BM Date: 06/05/15  Intake/Output from previous day: 05/14 0701 - 05/15 0700 In: 4705 [P.O.:1080; I.V.:3625] Out: 200 [Urine:200] Intake/Output this shift: Total I/O In: 855 [P.O.:480; I.V.:375] Out: 3 [Stool:3]  PE: General-- no acute distress  Abdomen-- nontender  Lab Results:  Recent Labs  06/03/15 0542 06/04/15 0600 06/04/15 1811 06/04/15 1840 06/05/15 0630  WBC 5.5 7.3 5.7 7.8 6.0  HGB 7.7* 10.2* 6.7* 10.3* 10.2*  HCT 23.1* 29.1* 20.0* 31.2* 29.8*  PLT 158 161 116* 181 184   BMET  Recent Labs  06/03/15 1335 06/04/15 0600 06/05/15 0630  NA 140 140 138  K 4.8 4.6 4.2  CL 110 111 110  CO2 24 23 24   CREATININE 1.36* 1.26* 1.21   LFT  Recent Labs  06/03/15 1335 06/04/15 0600 06/05/15 0630  PROT 4.9* 4.6* 4.9*  AST 21 17 18   ALT 13* 14* 12*  ALKPHOS 93 89 95  BILITOT 0.4 0.7 0.6   PT/INR  Recent Labs  06/03/15 1335  LABPROT 16.1*  INR 1.28   PANCREAS No results for input(s): LIPASE in the last 72 hours.       Studies/Results: Ir Angiogram Visceral Selective  06/03/2015  INDICATION: GI bleeding duodenal ulcer confirmed by  endoscopy EXAM: IR ULTRASOUND GUIDANCE VASC ACCESS RIGHT; SELECTIVE VISCERAL ARTERIOGRAPHY; ARTERIOGRAPHY; ADDITIONAL ARTERIOGRAPHY; IR EMBO ART VEN HEMORR LYMPH EXTRAV INC GUIDE ROADMAPPING MEDICATIONS: None. ANESTHESIA/SEDATION: Moderate (conscious) sedation was employed during this procedure. A total of Versed 0.5 mg and Fentanyl 25 mcg was administered intravenously. Moderate Sedation Time: 50 minutes. The patient's level of consciousness and vital signs were monitored continuously by radiology nursing throughout the procedure under my direct supervision. CONTRAST:  130 cc Omnipaque 3 under FLUOROSCOPY TIME:  Fluoroscopy Time: 9 minutes 30 seconds (947 mGy). COMPLICATIONS: None immediate. PROCEDURE: Informed consent was obtained from the patient following explanation of the procedure, risks, benefits and alternatives. The patient understands, agrees and consents for the procedure. All questions were addressed. A time out was performed prior to the initiation of the procedure. Maximal barrier sterile technique utilized including caps, mask, sterile gowns, sterile gloves, large sterile drape, hand hygiene, and Betadine prep. Under sterile conditions and local anesthesia, ultrasound micropuncture access performed of the patent right common femoral artery. Five French sheath inserted. C2 catheter advanced into the aorta. Over a Glidewire, celiac origin was selected. Celiac angiogram: Celiac origin is widely patent. The left gastric, splenic and hepatic vasculature are all patent. Catheter was advanced over a Glidewire into the common hepatic artery. Common hepatic angiogram performed. Common hepatic angiogram: Common, proper, right and left hepatic arteries are patent. Origin of the GDA is visualized and appears partially occluded proximally. Renegade micro catheter advanced through the  C2 catheter into the proximal GDA. GDA angiogram: Contrast injection demonstrates extravasation of contrast into the duodenum  compatible with a bleeding duodenal ulcer by endoscopy. Micro catheter was retracted back into the irregular eroded appearing gastroduodenal artery. Coil embolization performed of the GDA. Embolization: Through the Renegade catheter, micro coil embolization performed of the proximal GDA by deploying 1 3 mm x 6 mm and 2 4 mm x 4 cm microcoils. Post embolization angiogram confirms complete stasis of the proximal GDA. No further active bleeding demonstrated. Micro catheter removed. C2 catheter retracted and utilized to select the SMA. SMA angiogram performed. SMA angiogram: SMA is widely patent. Jejunal and colic branches are patent. No active bleeding demonstrated from the inferior pancreaticoduodenal arcade. Right common femoral artery sheath injected. This confirms access within the common femoral artery. Hemostasis obtained with the Exoseal device. No immediate complication. Patient tolerated the procedure well. IMPRESSION: Active bleeding from the large duodenal ulcer eroding into the gastroduodenal artery. Successful micro coil embolization of the proximal GDA with 3 and 4 mm interlock micro coils as above. Electronically Signed   By: Jerilynn Mages.  Shick M.D.   On: 06/03/2015 14:27   Ir Angiogram Visceral Selective  06/03/2015  INDICATION: GI bleeding duodenal ulcer confirmed by endoscopy EXAM: IR ULTRASOUND GUIDANCE VASC ACCESS RIGHT; SELECTIVE VISCERAL ARTERIOGRAPHY; ARTERIOGRAPHY; ADDITIONAL ARTERIOGRAPHY; IR EMBO ART VEN HEMORR LYMPH EXTRAV INC GUIDE ROADMAPPING MEDICATIONS: None. ANESTHESIA/SEDATION: Moderate (conscious) sedation was employed during this procedure. A total of Versed 0.5 mg and Fentanyl 25 mcg was administered intravenously. Moderate Sedation Time: 50 minutes. The patient's level of consciousness and vital signs were monitored continuously by radiology nursing throughout the procedure under my direct supervision. CONTRAST:  130 cc Omnipaque 3 under FLUOROSCOPY TIME:  Fluoroscopy Time: 9 minutes 30  seconds (947 mGy). COMPLICATIONS: None immediate. PROCEDURE: Informed consent was obtained from the patient following explanation of the procedure, risks, benefits and alternatives. The patient understands, agrees and consents for the procedure. All questions were addressed. A time out was performed prior to the initiation of the procedure. Maximal barrier sterile technique utilized including caps, mask, sterile gowns, sterile gloves, large sterile drape, hand hygiene, and Betadine prep. Under sterile conditions and local anesthesia, ultrasound micropuncture access performed of the patent right common femoral artery. Five French sheath inserted. C2 catheter advanced into the aorta. Over a Glidewire, celiac origin was selected. Celiac angiogram: Celiac origin is widely patent. The left gastric, splenic and hepatic vasculature are all patent. Catheter was advanced over a Glidewire into the common hepatic artery. Common hepatic angiogram performed. Common hepatic angiogram: Common, proper, right and left hepatic arteries are patent. Origin of the GDA is visualized and appears partially occluded proximally. Renegade micro catheter advanced through the C2 catheter into the proximal GDA. GDA angiogram: Contrast injection demonstrates extravasation of contrast into the duodenum compatible with a bleeding duodenal ulcer by endoscopy. Micro catheter was retracted back into the irregular eroded appearing gastroduodenal artery. Coil embolization performed of the GDA. Embolization: Through the Renegade catheter, micro coil embolization performed of the proximal GDA by deploying 1 3 mm x 6 mm and 2 4 mm x 4 cm microcoils. Post embolization angiogram confirms complete stasis of the proximal GDA. No further active bleeding demonstrated. Micro catheter removed. C2 catheter retracted and utilized to select the SMA. SMA angiogram performed. SMA angiogram: SMA is widely patent. Jejunal and colic branches are patent. No active bleeding  demonstrated from the inferior pancreaticoduodenal arcade. Right common femoral artery sheath injected. This confirms access within  the common femoral artery. Hemostasis obtained with the Exoseal device. No immediate complication. Patient tolerated the procedure well. IMPRESSION: Active bleeding from the large duodenal ulcer eroding into the gastroduodenal artery. Successful micro coil embolization of the proximal GDA with 3 and 4 mm interlock micro coils as above. Electronically Signed   By: Jerilynn Mages.  Shick M.D.   On: 06/03/2015 14:27   Ir Angiogram Selective Each Additional Vessel  06/03/2015  INDICATION: GI bleeding duodenal ulcer confirmed by endoscopy EXAM: IR ULTRASOUND GUIDANCE VASC ACCESS RIGHT; SELECTIVE VISCERAL ARTERIOGRAPHY; ARTERIOGRAPHY; ADDITIONAL ARTERIOGRAPHY; IR EMBO ART VEN HEMORR LYMPH EXTRAV INC GUIDE ROADMAPPING MEDICATIONS: None. ANESTHESIA/SEDATION: Moderate (conscious) sedation was employed during this procedure. A total of Versed 0.5 mg and Fentanyl 25 mcg was administered intravenously. Moderate Sedation Time: 50 minutes. The patient's level of consciousness and vital signs were monitored continuously by radiology nursing throughout the procedure under my direct supervision. CONTRAST:  130 cc Omnipaque 3 under FLUOROSCOPY TIME:  Fluoroscopy Time: 9 minutes 30 seconds (947 mGy). COMPLICATIONS: None immediate. PROCEDURE: Informed consent was obtained from the patient following explanation of the procedure, risks, benefits and alternatives. The patient understands, agrees and consents for the procedure. All questions were addressed. A time out was performed prior to the initiation of the procedure. Maximal barrier sterile technique utilized including caps, mask, sterile gowns, sterile gloves, large sterile drape, hand hygiene, and Betadine prep. Under sterile conditions and local anesthesia, ultrasound micropuncture access performed of the patent right common femoral artery. Five French sheath  inserted. C2 catheter advanced into the aorta. Over a Glidewire, celiac origin was selected. Celiac angiogram: Celiac origin is widely patent. The left gastric, splenic and hepatic vasculature are all patent. Catheter was advanced over a Glidewire into the common hepatic artery. Common hepatic angiogram performed. Common hepatic angiogram: Common, proper, right and left hepatic arteries are patent. Origin of the GDA is visualized and appears partially occluded proximally. Renegade micro catheter advanced through the C2 catheter into the proximal GDA. GDA angiogram: Contrast injection demonstrates extravasation of contrast into the duodenum compatible with a bleeding duodenal ulcer by endoscopy. Micro catheter was retracted back into the irregular eroded appearing gastroduodenal artery. Coil embolization performed of the GDA. Embolization: Through the Renegade catheter, micro coil embolization performed of the proximal GDA by deploying 1 3 mm x 6 mm and 2 4 mm x 4 cm microcoils. Post embolization angiogram confirms complete stasis of the proximal GDA. No further active bleeding demonstrated. Micro catheter removed. C2 catheter retracted and utilized to select the SMA. SMA angiogram performed. SMA angiogram: SMA is widely patent. Jejunal and colic branches are patent. No active bleeding demonstrated from the inferior pancreaticoduodenal arcade. Right common femoral artery sheath injected. This confirms access within the common femoral artery. Hemostasis obtained with the Exoseal device. No immediate complication. Patient tolerated the procedure well. IMPRESSION: Active bleeding from the large duodenal ulcer eroding into the gastroduodenal artery. Successful micro coil embolization of the proximal GDA with 3 and 4 mm interlock micro coils as above. Electronically Signed   By: Jerilynn Mages.  Shick M.D.   On: 06/03/2015 14:27   Ir Angiogram Follow Up Study  06/03/2015  INDICATION: GI bleeding duodenal ulcer confirmed by  endoscopy EXAM: IR ULTRASOUND GUIDANCE VASC ACCESS RIGHT; SELECTIVE VISCERAL ARTERIOGRAPHY; ARTERIOGRAPHY; ADDITIONAL ARTERIOGRAPHY; IR EMBO ART VEN HEMORR LYMPH EXTRAV INC GUIDE ROADMAPPING MEDICATIONS: None. ANESTHESIA/SEDATION: Moderate (conscious) sedation was employed during this procedure. A total of Versed 0.5 mg and Fentanyl 25 mcg was administered intravenously. Moderate Sedation Time: 50  minutes. The patient's level of consciousness and vital signs were monitored continuously by radiology nursing throughout the procedure under my direct supervision. CONTRAST:  130 cc Omnipaque 3 under FLUOROSCOPY TIME:  Fluoroscopy Time: 9 minutes 30 seconds (947 mGy). COMPLICATIONS: None immediate. PROCEDURE: Informed consent was obtained from the patient following explanation of the procedure, risks, benefits and alternatives. The patient understands, agrees and consents for the procedure. All questions were addressed. A time out was performed prior to the initiation of the procedure. Maximal barrier sterile technique utilized including caps, mask, sterile gowns, sterile gloves, large sterile drape, hand hygiene, and Betadine prep. Under sterile conditions and local anesthesia, ultrasound micropuncture access performed of the patent right common femoral artery. Five French sheath inserted. C2 catheter advanced into the aorta. Over a Glidewire, celiac origin was selected. Celiac angiogram: Celiac origin is widely patent. The left gastric, splenic and hepatic vasculature are all patent. Catheter was advanced over a Glidewire into the common hepatic artery. Common hepatic angiogram performed. Common hepatic angiogram: Common, proper, right and left hepatic arteries are patent. Origin of the GDA is visualized and appears partially occluded proximally. Renegade micro catheter advanced through the C2 catheter into the proximal GDA. GDA angiogram: Contrast injection demonstrates extravasation of contrast into the duodenum  compatible with a bleeding duodenal ulcer by endoscopy. Micro catheter was retracted back into the irregular eroded appearing gastroduodenal artery. Coil embolization performed of the GDA. Embolization: Through the Renegade catheter, micro coil embolization performed of the proximal GDA by deploying 1 3 mm x 6 mm and 2 4 mm x 4 cm microcoils. Post embolization angiogram confirms complete stasis of the proximal GDA. No further active bleeding demonstrated. Micro catheter removed. C2 catheter retracted and utilized to select the SMA. SMA angiogram performed. SMA angiogram: SMA is widely patent. Jejunal and colic branches are patent. No active bleeding demonstrated from the inferior pancreaticoduodenal arcade. Right common femoral artery sheath injected. This confirms access within the common femoral artery. Hemostasis obtained with the Exoseal device. No immediate complication. Patient tolerated the procedure well. IMPRESSION: Active bleeding from the large duodenal ulcer eroding into the gastroduodenal artery. Successful micro coil embolization of the proximal GDA with 3 and 4 mm interlock micro coils as above. Electronically Signed   By: Jerilynn Mages.  Shick M.D.   On: 06/03/2015 14:27   Ir US Guide Vasc Access Right  06/03/2015  INDICATION: GI bleeding duodenal ulcer confirmed by endoscopy EXAM: IR ULTRASOUND GUIDANCE VASC ACCESS RIGHT; SELECTIVE VISCERAL ARTERIOGRAPHY; ARTERIOGRAPHY; ADDITIONAL ARTERIOGRAPHY; IR EMBO ART VEN HEMORR LYMPH EXTRAV INC GUIDE ROADMAPPING MEDICATIONS: None. ANESTHESIA/SEDATION: Moderate (conscious) sedation was employed during this procedure. A total of Versed 0.5 mg and Fentanyl 25 mcg was administered intravenously. Moderate Sedation Time: 50 minutes. The patient's level of consciousness and vital signs were monitored continuously by radiology nursing throughout the procedure under my direct supervision. CONTRAST:  130 cc Omnipaque 3 under FLUOROSCOPY TIME:  Fluoroscopy Time: 9 minutes 30  seconds (947 mGy). COMPLICATIONS: None immediate. PROCEDURE: Informed consent was obtained from the patient following explanation of the procedure, risks, benefits and alternatives. The patient understands, agrees and consents for the procedure. All questions were addressed. A time out was performed prior to the initiation of the procedure. Maximal barrier sterile technique utilized including caps, mask, sterile gowns, sterile gloves, large sterile drape, hand hygiene, and Betadine prep. Under sterile conditions and local anesthesia, ultrasound micropuncture access performed of the patent right common femoral artery. Five French sheath inserted. C2 catheter advanced into the aorta.  Over a Glidewire, celiac origin was selected. Celiac angiogram: Celiac origin is widely patent. The left gastric, splenic and hepatic vasculature are all patent. Catheter was advanced over a Glidewire into the common hepatic artery. Common hepatic angiogram performed. Common hepatic angiogram: Common, proper, right and left hepatic arteries are patent. Origin of the GDA is visualized and appears partially occluded proximally. Renegade micro catheter advanced through the C2 catheter into the proximal GDA. GDA angiogram: Contrast injection demonstrates extravasation of contrast into the duodenum compatible with a bleeding duodenal ulcer by endoscopy. Micro catheter was retracted back into the irregular eroded appearing gastroduodenal artery. Coil embolization performed of the GDA. Embolization: Through the Renegade catheter, micro coil embolization performed of the proximal GDA by deploying 1 3 mm x 6 mm and 2 4 mm x 4 cm microcoils. Post embolization angiogram confirms complete stasis of the proximal GDA. No further active bleeding demonstrated. Micro catheter removed. C2 catheter retracted and utilized to select the SMA. SMA angiogram performed. SMA angiogram: SMA is widely patent. Jejunal and colic branches are patent. No active bleeding  demonstrated from the inferior pancreaticoduodenal arcade. Right common femoral artery sheath injected. This confirms access within the common femoral artery. Hemostasis obtained with the Exoseal device. No immediate complication. Patient tolerated the procedure well. IMPRESSION: Active bleeding from the large duodenal ulcer eroding into the gastroduodenal artery. Successful micro coil embolization of the proximal GDA with 3 and 4 mm interlock micro coils as above. Electronically Signed   By: Jerilynn Mages.  Shick M.D.   On: 06/03/2015 14:27   [place] Guide Roadmapping  06/03/2015  INDICATION: GI bleeding duodenal ulcer confirmed by endoscopy EXAM: IR ULTRASOUND GUIDANCE VASC ACCESS RIGHT; SELECTIVE VISCERAL ARTERIOGRAPHY; ARTERIOGRAPHY; ADDITIONAL ARTERIOGRAPHY; IR EMBO ART VEN HEMORR LYMPH EXTRAV INC GUIDE ROADMAPPING MEDICATIONS: None. ANESTHESIA/SEDATION: Moderate (conscious) sedation was employed during this procedure. A total of Versed 0.5 mg and Fentanyl 25 mcg was administered intravenously. Moderate Sedation Time: 50 minutes. The patient's level of consciousness and vital signs were monitored continuously by radiology nursing throughout the procedure under my direct supervision. CONTRAST:  130 cc Omnipaque 3 under FLUOROSCOPY TIME:  Fluoroscopy Time: 9 minutes 30 seconds (947 mGy). COMPLICATIONS: None immediate. PROCEDURE: Informed consent was obtained from the patient following explanation of the procedure, risks, benefits and alternatives. The patient understands, agrees and consents for the procedure. All questions were addressed. A time out was performed prior to the initiation of the procedure. Maximal barrier sterile technique utilized including caps, mask, sterile gowns, sterile gloves, large sterile drape, hand hygiene, and Betadine prep. Under sterile conditions and local anesthesia, ultrasound micropuncture access performed of the patent right common femoral artery. Five  French sheath inserted. C2 catheter advanced into the aorta. Over a Glidewire, celiac origin was selected. Celiac angiogram: Celiac origin is widely patent. The left gastric, splenic and hepatic vasculature are all patent. Catheter was advanced over a Glidewire into the common hepatic artery. Common hepatic angiogram performed. Common hepatic angiogram: Common, proper, right and left hepatic arteries are patent. Origin of the GDA is visualized and appears partially occluded proximally. Renegade micro catheter advanced through the C2 catheter into the proximal GDA. GDA angiogram: Contrast injection demonstrates extravasation of contrast into the duodenum compatible with a bleeding duodenal ulcer by endoscopy. Micro catheter was retracted back into the irregular eroded appearing gastroduodenal artery. Coil embolization performed of the GDA. Embolization: Through the Renegade catheter, micro coil embolization performed of the proximal GDA by deploying  1 3 mm x 6 mm and 2 4 mm x 4 cm microcoils. Post embolization angiogram confirms complete stasis of the proximal GDA. No further active bleeding demonstrated. Micro catheter removed. C2 catheter retracted and utilized to select the SMA. SMA angiogram performed. SMA angiogram: SMA is widely patent. Jejunal and colic branches are patent. No active bleeding demonstrated from the inferior pancreaticoduodenal arcade. Right common femoral artery sheath injected. This confirms access within the common femoral artery. Hemostasis obtained with the Exoseal device. No immediate complication. Patient tolerated the procedure well. IMPRESSION: Active bleeding from the large duodenal ulcer eroding into the gastroduodenal artery. Successful micro coil embolization of the proximal GDA with 3 and 4 mm interlock micro coils as above. Electronically Signed   By: Jerilynn Mages.  Shick M.D.   On: 06/03/2015 14:27    Medications: I have reviewed the patient's current medications.  Assessment/Plan: 1.  G.I. bleed. This was secondary to massive duodenal ulcer requiring embolization. Has done well since then. Has not received transfusion in a couple days but continues to have melenic stools. Hemoglobin is stable. This could be old blood. Agree with watching one more day. Go ahead and give Miralax and we'll see if the melena does not clear out. Do not see a need for urgent intervention at this moment in time.   Kiandre Spagnolo JR,Elvira Langston L 06/05/2015, 11:16 AM  This note was created using voice recognition software. Minor errors may Have occurred unintentionally.  Pager: [phone] If no answer or after hours call [phone]

## 2015-06-05 NOTE — Care Management Important Message (Signed)
Important Message  Patient Details  Name: BRANDOM SCIARRINO MRN: [id number] Date of Birth: 02-14-1935   Medicare Important Message Given:  Yes    Zenon Mayo, RN 06/05/2015, 3:39 [place] Message  Patient Details  Name: PIERSEN HOGENSON MRN: [id number] Date of Birth: 01-Oct-1935   Medicare Important Message Given:  Yes    Zenon Mayo, RN 06/05/2015, 3:39 PM

## 2015-06-05 NOTE — Progress Notes (Signed)
Referring Physician(s): Dr Reyne Dumas  Supervising Physician: Corrie Mckusick  Patient Status: In-pt  Chief Complaint:  GI Bleed Duodenal ulcer  Subjective:  Hgb now 11.6 Pt up in chair Eating soft diet Noted some abd pain with foods No new bleeding  Allergies: Doxycycline; Colestipol; and Doxycycline  Medications: Prior to Admission medications   Medication Sig Start Date End Date Taking? Authorizing Provider  Artificial Tear Ointment (DRY EYES OP) Apply 1-2 drops to eye daily as needed (dry eyes).   Yes Historical Provider, MD  cetirizine-pseudoephedrine (ZYRTEC-D) 5-120 MG tablet Take 1 tablet by mouth daily as needed for allergies.   Yes Historical Provider, MD  CREON 24000 units CPEP Take 2 capsules by mouth 4 (four) times daily. Take 2 capsules with meals and 1 capsule with snacks. 05/15/15  Yes Historical Provider, MD  ondansetron (ZOFRAN) 8 MG tablet Take 8 mg by mouth every 8 (eight) hours as needed for nausea or vomiting.  10/06/14  Yes Historical Provider, MD  ranitidine (ZANTAC 150 MAXIMUM STRENGTH) 150 MG tablet Take 150 mg by mouth as needed for heartburn.   Yes Historical Provider, MD  simethicone (MYLICON) 0000000 MG chewable tablet Chew 125 mg by mouth every 6 (six) hours as needed for flatulence.   Yes Historical Provider, MD  sodium chloride (OCEAN) 0.65 % SOLN nasal spray Place 1 spray into both nostrils as needed for congestion.   Yes Historical Provider, MD  feeding supplement (BOOST / RESOURCE BREEZE) LIQD Take 1 Container by mouth 3 (three) times daily with meals. 06/05/15   Reyne Dumas, MD  HYDROcodone-acetaminophen (NORCO/VICODIN) 5-325 MG tablet Take 1 tablet by mouth every 4 (four) hours as needed for moderate pain. 06/05/15   Reyne Dumas, MD  pantoprazole (PROTONIX) 40 MG tablet Take 1 tablet (40 mg total) by mouth 2 (two) times daily. 06/05/15   Reyne Dumas, MD     Vital Signs: BP 119/78 mmHg  Pulse 107  Temp(Src) 98.2 F (36.8 C) (Oral)  Resp  24  Ht 5\' 9"  (1.753 m)  Wt 182 lb 15.7 oz (83 kg)  BMI 27.01 kg/m2  SpO2 96%  Physical Exam  Constitutional: He is oriented to person, place, and time.  Abdominal: Soft. Bowel sounds are normal.  Neurological: He is alert and oriented to person, place, and time.  Skin: Skin is warm and dry.  Rt groin NT no bleeding No hematoma Rt foot 2+ pulses    Imaging: Ir Angiogram Visceral Selective  06/03/2015  INDICATION: GI bleeding duodenal ulcer confirmed by endoscopy EXAM: IR ULTRASOUND GUIDANCE VASC ACCESS RIGHT; SELECTIVE VISCERAL ARTERIOGRAPHY; ARTERIOGRAPHY; ADDITIONAL ARTERIOGRAPHY; IR EMBO ART VEN HEMORR LYMPH EXTRAV INC GUIDE ROADMAPPING MEDICATIONS: None. ANESTHESIA/SEDATION: Moderate (conscious) sedation was employed during this procedure. A total of Versed 0.5 mg and Fentanyl 25 mcg was administered intravenously. Moderate Sedation Time: 50 minutes. The patient's level of consciousness and vital signs were monitored continuously by radiology nursing throughout the procedure under my direct supervision. CONTRAST:  130 cc Omnipaque 3 under FLUOROSCOPY TIME:  Fluoroscopy Time: 9 minutes 30 seconds (947 mGy). COMPLICATIONS: None immediate. PROCEDURE: Informed consent was obtained from the patient following explanation of the procedure, risks, benefits and alternatives. The patient understands, agrees and consents for the procedure. All questions were addressed. A time out was performed prior to the initiation of the procedure. Maximal barrier sterile technique utilized including caps, mask, sterile gowns, sterile gloves, large sterile drape, hand hygiene, and Betadine prep. Under sterile conditions and local anesthesia, ultrasound micropuncture  access performed of the patent right common femoral artery. Five French sheath inserted. C2 catheter advanced into the aorta. Over a Glidewire, celiac origin was selected. Celiac angiogram: Celiac origin is widely patent. The left gastric, splenic and  hepatic vasculature are all patent. Catheter was advanced over a Glidewire into the common hepatic artery. Common hepatic angiogram performed. Common hepatic angiogram: Common, proper, right and left hepatic arteries are patent. Origin of the GDA is visualized and appears partially occluded proximally. Renegade micro catheter advanced through the C2 catheter into the proximal GDA. GDA angiogram: Contrast injection demonstrates extravasation of contrast into the duodenum compatible with a bleeding duodenal ulcer by endoscopy. Micro catheter was retracted back into the irregular eroded appearing gastroduodenal artery. Coil embolization performed of the GDA. Embolization: Through the Renegade catheter, micro coil embolization performed of the proximal GDA by deploying 1 3 mm x 6 mm and 2 4 mm x 4 cm microcoils. Post embolization angiogram confirms complete stasis of the proximal GDA. No further active bleeding demonstrated. Micro catheter removed. C2 catheter retracted and utilized to select the SMA. SMA angiogram performed. SMA angiogram: SMA is widely patent. Jejunal and colic branches are patent. No active bleeding demonstrated from the inferior pancreaticoduodenal arcade. Right common femoral artery sheath injected. This confirms access within the common femoral artery. Hemostasis obtained with the Exoseal device. No immediate complication. Patient tolerated the procedure well. IMPRESSION: Active bleeding from the large duodenal ulcer eroding into the gastroduodenal artery. Successful micro coil embolization of the proximal GDA with 3 and 4 mm interlock micro coils as above. Electronically Signed   By: Jerilynn Mages.  Shick M.D.   On: 06/03/2015 14:27   Ir Angiogram Visceral Selective  06/03/2015  INDICATION: GI bleeding duodenal ulcer confirmed by endoscopy EXAM: IR ULTRASOUND GUIDANCE VASC ACCESS RIGHT; SELECTIVE VISCERAL ARTERIOGRAPHY; ARTERIOGRAPHY; ADDITIONAL ARTERIOGRAPHY; IR EMBO ART VEN HEMORR LYMPH EXTRAV INC GUIDE  ROADMAPPING MEDICATIONS: None. ANESTHESIA/SEDATION: Moderate (conscious) sedation was employed during this procedure. A total of Versed 0.5 mg and Fentanyl 25 mcg was administered intravenously. Moderate Sedation Time: 50 minutes. The patient's level of consciousness and vital signs were monitored continuously by radiology nursing throughout the procedure under my direct supervision. CONTRAST:  130 cc Omnipaque 3 under FLUOROSCOPY TIME:  Fluoroscopy Time: 9 minutes 30 seconds (947 mGy). COMPLICATIONS: None immediate. PROCEDURE: Informed consent was obtained from the patient following explanation of the procedure, risks, benefits and alternatives. The patient understands, agrees and consents for the procedure. All questions were addressed. A time out was performed prior to the initiation of the procedure. Maximal barrier sterile technique utilized including caps, mask, sterile gowns, sterile gloves, large sterile drape, hand hygiene, and Betadine prep. Under sterile conditions and local anesthesia, ultrasound micropuncture access performed of the patent right common femoral artery. Five French sheath inserted. C2 catheter advanced into the aorta. Over a Glidewire, celiac origin was selected. Celiac angiogram: Celiac origin is widely patent. The left gastric, splenic and hepatic vasculature are all patent. Catheter was advanced over a Glidewire into the common hepatic artery. Common hepatic angiogram performed. Common hepatic angiogram: Common, proper, right and left hepatic arteries are patent. Origin of the GDA is visualized and appears partially occluded proximally. Renegade micro catheter advanced through the C2 catheter into the proximal GDA. GDA angiogram: Contrast injection demonstrates extravasation of contrast into the duodenum compatible with a bleeding duodenal ulcer by endoscopy. Micro catheter was retracted back into the irregular eroded appearing gastroduodenal artery. Coil embolization performed of the  GDA. Embolization: Through the  Renegade catheter, micro coil embolization performed of the proximal GDA by deploying 1 3 mm x 6 mm and 2 4 mm x 4 cm microcoils. Post embolization angiogram confirms complete stasis of the proximal GDA. No further active bleeding demonstrated. Micro catheter removed. C2 catheter retracted and utilized to select the SMA. SMA angiogram performed. SMA angiogram: SMA is widely patent. Jejunal and colic branches are patent. No active bleeding demonstrated from the inferior pancreaticoduodenal arcade. Right common femoral artery sheath injected. This confirms access within the common femoral artery. Hemostasis obtained with the Exoseal device. No immediate complication. Patient tolerated the procedure well. IMPRESSION: Active bleeding from the large duodenal ulcer eroding into the gastroduodenal artery. Successful micro coil embolization of the proximal GDA with 3 and 4 mm interlock micro coils as above. Electronically Signed   By: Jerilynn Mages.  Shick M.D.   On: 06/03/2015 14:27   Ir Angiogram Selective Each Additional Vessel  06/03/2015  INDICATION: GI bleeding duodenal ulcer confirmed by endoscopy EXAM: IR ULTRASOUND GUIDANCE VASC ACCESS RIGHT; SELECTIVE VISCERAL ARTERIOGRAPHY; ARTERIOGRAPHY; ADDITIONAL ARTERIOGRAPHY; IR EMBO ART VEN HEMORR LYMPH EXTRAV INC GUIDE ROADMAPPING MEDICATIONS: None. ANESTHESIA/SEDATION: Moderate (conscious) sedation was employed during this procedure. A total of Versed 0.5 mg and Fentanyl 25 mcg was administered intravenously. Moderate Sedation Time: 50 minutes. The patient's level of consciousness and vital signs were monitored continuously by radiology nursing throughout the procedure under my direct supervision. CONTRAST:  130 cc Omnipaque 3 under FLUOROSCOPY TIME:  Fluoroscopy Time: 9 minutes 30 seconds (947 mGy). COMPLICATIONS: None immediate. PROCEDURE: Informed consent was obtained from the patient following explanation of the procedure, risks, benefits and  alternatives. The patient understands, agrees and consents for the procedure. All questions were addressed. A time out was performed prior to the initiation of the procedure. Maximal barrier sterile technique utilized including caps, mask, sterile gowns, sterile gloves, large sterile drape, hand hygiene, and Betadine prep. Under sterile conditions and local anesthesia, ultrasound micropuncture access performed of the patent right common femoral artery. Five French sheath inserted. C2 catheter advanced into the aorta. Over a Glidewire, celiac origin was selected. Celiac angiogram: Celiac origin is widely patent. The left gastric, splenic and hepatic vasculature are all patent. Catheter was advanced over a Glidewire into the common hepatic artery. Common hepatic angiogram performed. Common hepatic angiogram: Common, proper, right and left hepatic arteries are patent. Origin of the GDA is visualized and appears partially occluded proximally. Renegade micro catheter advanced through the C2 catheter into the proximal GDA. GDA angiogram: Contrast injection demonstrates extravasation of contrast into the duodenum compatible with a bleeding duodenal ulcer by endoscopy. Micro catheter was retracted back into the irregular eroded appearing gastroduodenal artery. Coil embolization performed of the GDA. Embolization: Through the Renegade catheter, micro coil embolization performed of the proximal GDA by deploying 1 3 mm x 6 mm and 2 4 mm x 4 cm microcoils. Post embolization angiogram confirms complete stasis of the proximal GDA. No further active bleeding demonstrated. Micro catheter removed. C2 catheter retracted and utilized to select the SMA. SMA angiogram performed. SMA angiogram: SMA is widely patent. Jejunal and colic branches are patent. No active bleeding demonstrated from the inferior pancreaticoduodenal arcade. Right common femoral artery sheath injected. This confirms access within the common femoral artery.  Hemostasis obtained with the Exoseal device. No immediate complication. Patient tolerated the procedure well. IMPRESSION: Active bleeding from the large duodenal ulcer eroding into the gastroduodenal artery. Successful micro coil embolization of the proximal GDA with 3 and 4 mm interlock  micro coils as above. Electronically Signed   By: Jerilynn Mages.  Shick M.D.   On: 06/03/2015 14:27   Ir Angiogram Follow Up Study  06/03/2015  INDICATION: GI bleeding duodenal ulcer confirmed by endoscopy EXAM: IR ULTRASOUND GUIDANCE VASC ACCESS RIGHT; SELECTIVE VISCERAL ARTERIOGRAPHY; ARTERIOGRAPHY; ADDITIONAL ARTERIOGRAPHY; IR EMBO ART VEN HEMORR LYMPH EXTRAV INC GUIDE ROADMAPPING MEDICATIONS: None. ANESTHESIA/SEDATION: Moderate (conscious) sedation was employed during this procedure. A total of Versed 0.5 mg and Fentanyl 25 mcg was administered intravenously. Moderate Sedation Time: 50 minutes. The patient's level of consciousness and vital signs were monitored continuously by radiology nursing throughout the procedure under my direct supervision. CONTRAST:  130 cc Omnipaque 3 under FLUOROSCOPY TIME:  Fluoroscopy Time: 9 minutes 30 seconds (947 mGy). COMPLICATIONS: None immediate. PROCEDURE: Informed consent was obtained from the patient following explanation of the procedure, risks, benefits and alternatives. The patient understands, agrees and consents for the procedure. All questions were addressed. A time out was performed prior to the initiation of the procedure. Maximal barrier sterile technique utilized including caps, mask, sterile gowns, sterile gloves, large sterile drape, hand hygiene, and Betadine prep. Under sterile conditions and local anesthesia, ultrasound micropuncture access performed of the patent right common femoral artery. Five French sheath inserted. C2 catheter advanced into the aorta. Over a Glidewire, celiac origin was selected. Celiac angiogram: Celiac origin is widely patent. The left gastric, splenic and  hepatic vasculature are all patent. Catheter was advanced over a Glidewire into the common hepatic artery. Common hepatic angiogram performed. Common hepatic angiogram: Common, proper, right and left hepatic arteries are patent. Origin of the GDA is visualized and appears partially occluded proximally. Renegade micro catheter advanced through the C2 catheter into the proximal GDA. GDA angiogram: Contrast injection demonstrates extravasation of contrast into the duodenum compatible with a bleeding duodenal ulcer by endoscopy. Micro catheter was retracted back into the irregular eroded appearing gastroduodenal artery. Coil embolization performed of the GDA. Embolization: Through the Renegade catheter, micro coil embolization performed of the proximal GDA by deploying 1 3 mm x 6 mm and 2 4 mm x 4 cm microcoils. Post embolization angiogram confirms complete stasis of the proximal GDA. No further active bleeding demonstrated. Micro catheter removed. C2 catheter retracted and utilized to select the SMA. SMA angiogram performed. SMA angiogram: SMA is widely patent. Jejunal and colic branches are patent. No active bleeding demonstrated from the inferior pancreaticoduodenal arcade. Right common femoral artery sheath injected. This confirms access within the common femoral artery. Hemostasis obtained with the Exoseal device. No immediate complication. Patient tolerated the procedure well. IMPRESSION: Active bleeding from the large duodenal ulcer eroding into the gastroduodenal artery. Successful micro coil embolization of the proximal GDA with 3 and 4 mm interlock micro coils as above. Electronically Signed   By: Jerilynn Mages.  Shick M.D.   On: 06/03/2015 14:27   Ir US Guide Vasc Access Right  06/03/2015  INDICATION: GI bleeding duodenal ulcer confirmed by endoscopy EXAM: IR ULTRASOUND GUIDANCE VASC ACCESS RIGHT; SELECTIVE VISCERAL ARTERIOGRAPHY; ARTERIOGRAPHY; ADDITIONAL ARTERIOGRAPHY; IR EMBO ART VEN HEMORR LYMPH EXTRAV INC GUIDE  ROADMAPPING MEDICATIONS: None. ANESTHESIA/SEDATION: Moderate (conscious) sedation was employed during this procedure. A total of Versed 0.5 mg and Fentanyl 25 mcg was administered intravenously. Moderate Sedation Time: 50 minutes. The patient's level of consciousness and vital signs were monitored continuously by radiology nursing throughout the procedure under my direct supervision. CONTRAST:  130 cc Omnipaque 3 under FLUOROSCOPY TIME:  Fluoroscopy Time: 9 minutes 30 seconds (947 mGy). COMPLICATIONS: None immediate. PROCEDURE: Informed  consent was obtained from the patient following explanation of the procedure, risks, benefits and alternatives. The patient understands, agrees and consents for the procedure. All questions were addressed. A time out was performed prior to the initiation of the procedure. Maximal barrier sterile technique utilized including caps, mask, sterile gowns, sterile gloves, large sterile drape, hand hygiene, and Betadine prep. Under sterile conditions and local anesthesia, ultrasound micropuncture access performed of the patent right common femoral artery. Five French sheath inserted. C2 catheter advanced into the aorta. Over a Glidewire, celiac origin was selected. Celiac angiogram: Celiac origin is widely patent. The left gastric, splenic and hepatic vasculature are all patent. Catheter was advanced over a Glidewire into the common hepatic artery. Common hepatic angiogram performed. Common hepatic angiogram: Common, proper, right and left hepatic arteries are patent. Origin of the GDA is visualized and appears partially occluded proximally. Renegade micro catheter advanced through the C2 catheter into the proximal GDA. GDA angiogram: Contrast injection demonstrates extravasation of contrast into the duodenum compatible with a bleeding duodenal ulcer by endoscopy. Micro catheter was retracted back into the irregular eroded appearing gastroduodenal artery. Coil embolization performed of the  GDA. Embolization: Through the Renegade catheter, micro coil embolization performed of the proximal GDA by deploying 1 3 mm x 6 mm and 2 4 mm x 4 cm microcoils. Post embolization angiogram confirms complete stasis of the proximal GDA. No further active bleeding demonstrated. Micro catheter removed. C2 catheter retracted and utilized to select the SMA. SMA angiogram performed. SMA angiogram: SMA is widely patent. Jejunal and colic branches are patent. No active bleeding demonstrated from the inferior pancreaticoduodenal arcade. Right common femoral artery sheath injected. This confirms access within the common femoral artery. Hemostasis obtained with the Exoseal device. No immediate complication. Patient tolerated the procedure well. IMPRESSION: Active bleeding from the large duodenal ulcer eroding into the gastroduodenal artery. Successful micro coil embolization of the proximal GDA with 3 and 4 mm interlock micro coils as above. Electronically Signed   By: Jerilynn Mages.  Shick M.D.   On: 06/03/2015 14:27   [place] Guide Roadmapping  06/03/2015  INDICATION: GI bleeding duodenal ulcer confirmed by endoscopy EXAM: IR ULTRASOUND GUIDANCE VASC ACCESS RIGHT; SELECTIVE VISCERAL ARTERIOGRAPHY; ARTERIOGRAPHY; ADDITIONAL ARTERIOGRAPHY; IR EMBO ART VEN HEMORR LYMPH EXTRAV INC GUIDE ROADMAPPING MEDICATIONS: None. ANESTHESIA/SEDATION: Moderate (conscious) sedation was employed during this procedure. A total of Versed 0.5 mg and Fentanyl 25 mcg was administered intravenously. Moderate Sedation Time: 50 minutes. The patient's level of consciousness and vital signs were monitored continuously by radiology nursing throughout the procedure under my direct supervision. CONTRAST:  130 cc Omnipaque 3 under FLUOROSCOPY TIME:  Fluoroscopy Time: 9 minutes 30 seconds (947 mGy). COMPLICATIONS: None immediate. PROCEDURE: Informed consent was obtained from the patient following explanation of the procedure, risks,  benefits and alternatives. The patient understands, agrees and consents for the procedure. All questions were addressed. A time out was performed prior to the initiation of the procedure. Maximal barrier sterile technique utilized including caps, mask, sterile gowns, sterile gloves, large sterile drape, hand hygiene, and Betadine prep. Under sterile conditions and local anesthesia, ultrasound micropuncture access performed of the patent right common femoral artery. Five French sheath inserted. C2 catheter advanced into the aorta. Over a Glidewire, celiac origin was selected. Celiac angiogram: Celiac origin is widely patent. The left gastric, splenic and hepatic vasculature are all patent. Catheter was advanced over a Glidewire into the common hepatic artery. Common hepatic angiogram performed.  Common hepatic angiogram: Common, proper, right and left hepatic arteries are patent. Origin of the GDA is visualized and appears partially occluded proximally. Renegade micro catheter advanced through the C2 catheter into the proximal GDA. GDA angiogram: Contrast injection demonstrates extravasation of contrast into the duodenum compatible with a bleeding duodenal ulcer by endoscopy. Micro catheter was retracted back into the irregular eroded appearing gastroduodenal artery. Coil embolization performed of the GDA. Embolization: Through the Renegade catheter, micro coil embolization performed of the proximal GDA by deploying 1 3 mm x 6 mm and 2 4 mm x 4 cm microcoils. Post embolization angiogram confirms complete stasis of the proximal GDA. No further active bleeding demonstrated. Micro catheter removed. C2 catheter retracted and utilized to select the SMA. SMA angiogram performed. SMA angiogram: SMA is widely patent. Jejunal and colic branches are patent. No active bleeding demonstrated from the inferior pancreaticoduodenal arcade. Right common femoral artery sheath injected. This confirms access within the common femoral  artery. Hemostasis obtained with the Exoseal device. No immediate complication. Patient tolerated the procedure well. IMPRESSION: Active bleeding from the large duodenal ulcer eroding into the gastroduodenal artery. Successful micro coil embolization of the proximal GDA with 3 and 4 mm interlock micro coils as above. Electronically Signed   By: Jerilynn Mages.  Shick M.D.   On: 06/03/2015 14:27    Labs:  CBC:  Recent Labs  06/04/15 1811 06/04/15 1840 06/05/15 0630 06/05/15 1249  WBC 5.7 7.8 6.0 6.8  HGB 6.7* 10.3* 10.2* 11.6*  HCT 20.0* 31.2* 29.8* 34.3*  PLT 116* 181 184 194    COAGS:  Recent Labs  09/06/14 1336 09/14/14 0717 06/02/15 0311 06/03/15 1335  INR 1.12 1.10 1.33 1.28  APTT  --   --  31  --     BMP:  Recent Labs  06/02/15 0311 06/03/15 1335 06/04/15 0600 06/05/15 0630  NA 135 140 140 138  K 4.3 4.8 4.6 4.2  CL 105 110 111 110  CO2 25 24 23 24   GLUCOSE 147* 111* 102* 111*  BUN 21* 12 9 6   CALCIUM 8.1* 8.4* 8.1* 8.3*  CREATININE 1.44* 1.36* 1.26* 1.21  GFRNONAA 45* 49* 53* 56*  GFRAA 53* 56* >60 >60    LIVER FUNCTION TESTS:  Recent Labs  06/02/15 0311 06/03/15 1335 06/04/15 0600 06/05/15 0630  BILITOT 0.4 0.4 0.7 0.6  AST 20 21 17 18   ALT 15* 13* 14* 12*  ALKPHOS 90 93 89 95  PROT 5.7* 4.9* 4.6* 4.9*  ALBUMIN 2.8* 2.3* 2.1* 2.2*    Assessment and Plan:  Successful micro coil embolization of the proximal GDA with 3 and 4 mm interlock micro coils 06/03/15 Doing well Call if need [place]  Electronically Signed: Jeni Duling A 06/05/2015, 2:13 PM   I spent a total of 15 Minutes at the the patient's bedside AND on the patient's hospital floor or unit, greater than 50% of which was counseling/coordinating care for GDA embolization

## 2015-06-05 NOTE — Discharge Summary (Addendum)
Physician Discharge Summary  AVON MERGENTHALER MRN: [id number] DOB/AGE: May 16, 1935 80 y.o.  PCP: [name], MD   Admit date: 06/02/2015 Discharge date: 06/05/2015  Discharge Diagnoses:     Principal Problem:   GI bleeding Active Problems:   Pancreatic cancer ([place])   Acute blood loss anemia   AKI (acute kidney injury) ([place])   Syncope   Hematochezia   Protein-calorie malnutrition, severe   Palliative care encounter   Duodenal ulcer with hemorrhage    Addendum Discharge canceled as the patient just had another episode of hematochezia this morning Will continue to monitor in SDU, await further GI recommendations   Follow-up recommendations Follow-up with PCP in 3-5 days , including all  additional recommended appointments as below Follow-up CBC, CMP in 3-5 days Patient would like to follow-up with his oncologist at [facility]       Current Discharge Medication List    START taking these medications   Details  feeding supplement (BOOST / RESOURCE BREEZE) LIQD Take 1 Container by mouth 3 (three) times daily with meals. Qty: 90 Container, Refills: 0      CONTINUE these medications which have CHANGED   Details  HYDROcodone-acetaminophen (NORCO/VICODIN) 5-325 MG tablet Take 1 tablet by mouth every 4 (four) hours as needed for moderate pain. Qty: 30 tablet, Refills: 0    pantoprazole (PROTONIX) 40 MG tablet Take 1 tablet (40 mg total) by mouth 2 (two) times daily. Qty: 80 tablet, Refills: 1      CONTINUE these medications which have NOT CHANGED   Details  Artificial Tear Ointment (DRY EYES OP) Apply 1-2 drops to eye daily as needed (dry eyes).    cetirizine-pseudoephedrine (ZYRTEC-D) 5-120 MG tablet Take 1 tablet by mouth daily as needed for allergies.    CREON 24000 units CPEP Take 2 capsules by mouth 4 (four) times daily. Take 2 capsules with meals and 1 capsule with snacks. Refills: 1    ondansetron (ZOFRAN) 8 MG tablet Take 8 mg by mouth every 8 (eight)  hours as needed for nausea or vomiting.     simethicone (MYLICON) 242 MG chewable tablet Chew 125 mg by mouth every 6 (six) hours as needed for flatulence.      STOP taking these medications     ranitidine (ZANTAC 150 MAXIMUM STRENGTH) 150 MG tablet      sodium chloride (OCEAN) 0.65 % SOLN nasal spray      oxyCODONE (OXY IR/ROXICODONE) 5 MG immediate release tablet          Discharge Condition: Overall prognosis poor   Discharge Instructions Get Medicines reviewed and adjusted: Please take all your medications with you for your next visit with your Primary MD  Please request your Primary MD to go over all hospital tests and procedure/radiological results at the follow up, please ask your Primary MD to get all Hospital records sent to his/her office.  If you experience worsening of your admission symptoms, develop shortness of breath, life threatening emergency, suicidal or homicidal thoughts you must seek medical attention immediately by calling 911 or calling your MD immediately if symptoms less severe.  You must read complete instructions/literature along with all the possible adverse reactions/side effects for all the Medicines you take and that have been prescribed to you. Take any new Medicines after you have completely understood and accpet all the possible adverse reactions/side effects.   Do not drive when taking Pain medications.   Do not take more than prescribed Pain, Sleep and Anxiety Medications  Special Instructions: If you have smoked or chewed Tobacco in the last 2 yrs please stop smoking, stop any regular Alcohol and or any Recreational drug use.  Wear Seat belts while driving.  Please note  You were cared for by a hospitalist during your hospital stay. Once you are discharged, your primary care physician will handle any further medical issues. Please note that NO REFILLS for any discharge medications will be authorized once you are discharged, as it is  imperative that you return to your primary care physician (or establish a relationship with a primary care physician if you do not have one) for your aftercare needs so that they can reassess your need for medications and monitor your lab values.  Discharge Instructions    Diet - low sodium heart healthy    Complete by:  As directed      Increase activity slowly    Complete by:  As directed             Allergies  Allergen Reactions  . Doxycycline Itching    Severe itching   . Colestipol Itching  . Doxycycline Rash      Disposition: 01-Home or Self Care   Consults:  Gastroenterology     Significant Diagnostic Studies:  Ir Angiogram Visceral Selective  06/03/2015  INDICATION: GI bleeding duodenal ulcer confirmed by endoscopy EXAM: IR ULTRASOUND GUIDANCE VASC ACCESS RIGHT; SELECTIVE VISCERAL ARTERIOGRAPHY; ARTERIOGRAPHY; ADDITIONAL ARTERIOGRAPHY; IR EMBO ART VEN HEMORR LYMPH EXTRAV INC GUIDE ROADMAPPING MEDICATIONS: None. ANESTHESIA/SEDATION: Moderate (conscious) sedation was employed during this procedure. A total of Versed 0.5 mg and Fentanyl 25 mcg was administered intravenously. Moderate Sedation Time: 50 minutes. The patient's level of consciousness and vital signs were monitored continuously by radiology nursing throughout the procedure under my direct supervision. CONTRAST:  130 cc Omnipaque 3 under FLUOROSCOPY TIME:  Fluoroscopy Time: 9 minutes 30 seconds (947 mGy). COMPLICATIONS: None immediate. PROCEDURE: Informed consent was obtained from the patient following explanation of the procedure, risks, benefits and alternatives. The patient understands, agrees and consents for the procedure. All questions were addressed. A time out was performed prior to the initiation of the procedure. Maximal barrier sterile technique utilized including caps, mask, sterile gowns, sterile gloves, large sterile drape, hand hygiene, and Betadine prep. Under sterile conditions and local anesthesia,  ultrasound micropuncture access performed of the patent right common femoral artery. Five French sheath inserted. C2 catheter advanced into the aorta. Over a Glidewire, celiac origin was selected. Celiac angiogram: Celiac origin is widely patent. The left gastric, splenic and hepatic vasculature are all patent. Catheter was advanced over a Glidewire into the common hepatic artery. Common hepatic angiogram performed. Common hepatic angiogram: Common, proper, right and left hepatic arteries are patent. Origin of the GDA is visualized and appears partially occluded proximally. Renegade micro catheter advanced through the C2 catheter into the proximal GDA. GDA angiogram: Contrast injection demonstrates extravasation of contrast into the duodenum compatible with a bleeding duodenal ulcer by endoscopy. Micro catheter was retracted back into the irregular eroded appearing gastroduodenal artery. Coil embolization performed of the GDA. Embolization: Through the Renegade catheter, micro coil embolization performed of the proximal GDA by deploying 1 3 mm x 6 mm and 2 4 mm x 4 cm microcoils. Post embolization angiogram confirms complete stasis of the proximal GDA. No further active bleeding demonstrated. Micro catheter removed. C2 catheter retracted and utilized to select the SMA. SMA angiogram performed. SMA angiogram: SMA is widely patent. Jejunal and colic branches are patent. No active  bleeding demonstrated from the inferior pancreaticoduodenal arcade. Right common femoral artery sheath injected. This confirms access within the common femoral artery. Hemostasis obtained with the Exoseal device. No immediate complication. Patient tolerated the procedure well. IMPRESSION: Active bleeding from the large duodenal ulcer eroding into the gastroduodenal artery. Successful micro coil embolization of the proximal GDA with 3 and 4 mm interlock micro coils as above. Electronically Signed   By: Jerilynn Mages.  Shick M.D.   On: 06/03/2015 14:27    Ir Angiogram Visceral Selective  06/03/2015  INDICATION: GI bleeding duodenal ulcer confirmed by endoscopy EXAM: IR ULTRASOUND GUIDANCE VASC ACCESS RIGHT; SELECTIVE VISCERAL ARTERIOGRAPHY; ARTERIOGRAPHY; ADDITIONAL ARTERIOGRAPHY; IR EMBO ART VEN HEMORR LYMPH EXTRAV INC GUIDE ROADMAPPING MEDICATIONS: None. ANESTHESIA/SEDATION: Moderate (conscious) sedation was employed during this procedure. A total of Versed 0.5 mg and Fentanyl 25 mcg was administered intravenously. Moderate Sedation Time: 50 minutes. The patient's level of consciousness and vital signs were monitored continuously by radiology nursing throughout the procedure under my direct supervision. CONTRAST:  130 cc Omnipaque 3 under FLUOROSCOPY TIME:  Fluoroscopy Time: 9 minutes 30 seconds (947 mGy). COMPLICATIONS: None immediate. PROCEDURE: Informed consent was obtained from the patient following explanation of the procedure, risks, benefits and alternatives. The patient understands, agrees and consents for the procedure. All questions were addressed. A time out was performed prior to the initiation of the procedure. Maximal barrier sterile technique utilized including caps, mask, sterile gowns, sterile gloves, large sterile drape, hand hygiene, and Betadine prep. Under sterile conditions and local anesthesia, ultrasound micropuncture access performed of the patent right common femoral artery. Five French sheath inserted. C2 catheter advanced into the aorta. Over a Glidewire, celiac origin was selected. Celiac angiogram: Celiac origin is widely patent. The left gastric, splenic and hepatic vasculature are all patent. Catheter was advanced over a Glidewire into the common hepatic artery. Common hepatic angiogram performed. Common hepatic angiogram: Common, proper, right and left hepatic arteries are patent. Origin of the GDA is visualized and appears partially occluded proximally. Renegade micro catheter advanced through the C2 catheter into the proximal  GDA. GDA angiogram: Contrast injection demonstrates extravasation of contrast into the duodenum compatible with a bleeding duodenal ulcer by endoscopy. Micro catheter was retracted back into the irregular eroded appearing gastroduodenal artery. Coil embolization performed of the GDA. Embolization: Through the Renegade catheter, micro coil embolization performed of the proximal GDA by deploying 1 3 mm x 6 mm and 2 4 mm x 4 cm microcoils. Post embolization angiogram confirms complete stasis of the proximal GDA. No further active bleeding demonstrated. Micro catheter removed. C2 catheter retracted and utilized to select the SMA. SMA angiogram performed. SMA angiogram: SMA is widely patent. Jejunal and colic branches are patent. No active bleeding demonstrated from the inferior pancreaticoduodenal arcade. Right common femoral artery sheath injected. This confirms access within the common femoral artery. Hemostasis obtained with the Exoseal device. No immediate complication. Patient tolerated the procedure well. IMPRESSION: Active bleeding from the large duodenal ulcer eroding into the gastroduodenal artery. Successful micro coil embolization of the proximal GDA with 3 and 4 mm interlock micro coils as above. Electronically Signed   By: Jerilynn Mages.  Shick M.D.   On: 06/03/2015 14:27   Ir Angiogram Selective Each Additional Vessel  06/03/2015  INDICATION: GI bleeding duodenal ulcer confirmed by endoscopy EXAM: IR ULTRASOUND GUIDANCE VASC ACCESS RIGHT; SELECTIVE VISCERAL ARTERIOGRAPHY; ARTERIOGRAPHY; ADDITIONAL ARTERIOGRAPHY; IR EMBO ART VEN HEMORR LYMPH EXTRAV INC GUIDE ROADMAPPING MEDICATIONS: None. ANESTHESIA/SEDATION: Moderate (conscious) sedation was employed during this procedure. A  total of Versed 0.5 mg and Fentanyl 25 mcg was administered intravenously. Moderate Sedation Time: 50 minutes. The patient's level of consciousness and vital signs were monitored continuously by radiology nursing throughout the procedure under  my direct supervision. CONTRAST:  130 cc Omnipaque 3 under FLUOROSCOPY TIME:  Fluoroscopy Time: 9 minutes 30 seconds (947 mGy). COMPLICATIONS: None immediate. PROCEDURE: Informed consent was obtained from the patient following explanation of the procedure, risks, benefits and alternatives. The patient understands, agrees and consents for the procedure. All questions were addressed. A time out was performed prior to the initiation of the procedure. Maximal barrier sterile technique utilized including caps, mask, sterile gowns, sterile gloves, large sterile drape, hand hygiene, and Betadine prep. Under sterile conditions and local anesthesia, ultrasound micropuncture access performed of the patent right common femoral artery. Five French sheath inserted. C2 catheter advanced into the aorta. Over a Glidewire, celiac origin was selected. Celiac angiogram: Celiac origin is widely patent. The left gastric, splenic and hepatic vasculature are all patent. Catheter was advanced over a Glidewire into the common hepatic artery. Common hepatic angiogram performed. Common hepatic angiogram: Common, proper, right and left hepatic arteries are patent. Origin of the GDA is visualized and appears partially occluded proximally. Renegade micro catheter advanced through the C2 catheter into the proximal GDA. GDA angiogram: Contrast injection demonstrates extravasation of contrast into the duodenum compatible with a bleeding duodenal ulcer by endoscopy. Micro catheter was retracted back into the irregular eroded appearing gastroduodenal artery. Coil embolization performed of the GDA. Embolization: Through the Renegade catheter, micro coil embolization performed of the proximal GDA by deploying 1 3 mm x 6 mm and 2 4 mm x 4 cm microcoils. Post embolization angiogram confirms complete stasis of the proximal GDA. No further active bleeding demonstrated. Micro catheter removed. C2 catheter retracted and utilized to select the SMA. SMA  angiogram performed. SMA angiogram: SMA is widely patent. Jejunal and colic branches are patent. No active bleeding demonstrated from the inferior pancreaticoduodenal arcade. Right common femoral artery sheath injected. This confirms access within the common femoral artery. Hemostasis obtained with the Exoseal device. No immediate complication. Patient tolerated the procedure well. IMPRESSION: Active bleeding from the large duodenal ulcer eroding into the gastroduodenal artery. Successful micro coil embolization of the proximal GDA with 3 and 4 mm interlock micro coils as above. Electronically Signed   By: Jerilynn Mages.  Shick M.D.   On: 06/03/2015 14:27   Ir Angiogram Follow Up Study  06/03/2015  INDICATION: GI bleeding duodenal ulcer confirmed by endoscopy EXAM: IR ULTRASOUND GUIDANCE VASC ACCESS RIGHT; SELECTIVE VISCERAL ARTERIOGRAPHY; ARTERIOGRAPHY; ADDITIONAL ARTERIOGRAPHY; IR EMBO ART VEN HEMORR LYMPH EXTRAV INC GUIDE ROADMAPPING MEDICATIONS: None. ANESTHESIA/SEDATION: Moderate (conscious) sedation was employed during this procedure. A total of Versed 0.5 mg and Fentanyl 25 mcg was administered intravenously. Moderate Sedation Time: 50 minutes. The patient's level of consciousness and vital signs were monitored continuously by radiology nursing throughout the procedure under my direct supervision. CONTRAST:  130 cc Omnipaque 3 under FLUOROSCOPY TIME:  Fluoroscopy Time: 9 minutes 30 seconds (947 mGy). COMPLICATIONS: None immediate. PROCEDURE: Informed consent was obtained from the patient following explanation of the procedure, risks, benefits and alternatives. The patient understands, agrees and consents for the procedure. All questions were addressed. A time out was performed prior to the initiation of the procedure. Maximal barrier sterile technique utilized including caps, mask, sterile gowns, sterile gloves, large sterile drape, hand hygiene, and Betadine prep. Under sterile conditions and local anesthesia,  ultrasound micropuncture access performed of  the patent right common femoral artery. Five French sheath inserted. C2 catheter advanced into the aorta. Over a Glidewire, celiac origin was selected. Celiac angiogram: Celiac origin is widely patent. The left gastric, splenic and hepatic vasculature are all patent. Catheter was advanced over a Glidewire into the common hepatic artery. Common hepatic angiogram performed. Common hepatic angiogram: Common, proper, right and left hepatic arteries are patent. Origin of the GDA is visualized and appears partially occluded proximally. Renegade micro catheter advanced through the C2 catheter into the proximal GDA. GDA angiogram: Contrast injection demonstrates extravasation of contrast into the duodenum compatible with a bleeding duodenal ulcer by endoscopy. Micro catheter was retracted back into the irregular eroded appearing gastroduodenal artery. Coil embolization performed of the GDA. Embolization: Through the Renegade catheter, micro coil embolization performed of the proximal GDA by deploying 1 3 mm x 6 mm and 2 4 mm x 4 cm microcoils. Post embolization angiogram confirms complete stasis of the proximal GDA. No further active bleeding demonstrated. Micro catheter removed. C2 catheter retracted and utilized to select the SMA. SMA angiogram performed. SMA angiogram: SMA is widely patent. Jejunal and colic branches are patent. No active bleeding demonstrated from the inferior pancreaticoduodenal arcade. Right common femoral artery sheath injected. This confirms access within the common femoral artery. Hemostasis obtained with the Exoseal device. No immediate complication. Patient tolerated the procedure well. IMPRESSION: Active bleeding from the large duodenal ulcer eroding into the gastroduodenal artery. Successful micro coil embolization of the proximal GDA with 3 and 4 mm interlock micro coils as above. Electronically Signed   By: Jerilynn Mages.  Shick M.D.   On: 06/03/2015 14:27    Ir US Guide Vasc Access Right  06/03/2015  INDICATION: GI bleeding duodenal ulcer confirmed by endoscopy EXAM: IR ULTRASOUND GUIDANCE VASC ACCESS RIGHT; SELECTIVE VISCERAL ARTERIOGRAPHY; ARTERIOGRAPHY; ADDITIONAL ARTERIOGRAPHY; IR EMBO ART VEN HEMORR LYMPH EXTRAV INC GUIDE ROADMAPPING MEDICATIONS: None. ANESTHESIA/SEDATION: Moderate (conscious) sedation was employed during this procedure. A total of Versed 0.5 mg and Fentanyl 25 mcg was administered intravenously. Moderate Sedation Time: 50 minutes. The patient's level of consciousness and vital signs were monitored continuously by radiology nursing throughout the procedure under my direct supervision. CONTRAST:  130 cc Omnipaque 3 under FLUOROSCOPY TIME:  Fluoroscopy Time: 9 minutes 30 seconds (947 mGy). COMPLICATIONS: None immediate. PROCEDURE: Informed consent was obtained from the patient following explanation of the procedure, risks, benefits and alternatives. The patient understands, agrees and consents for the procedure. All questions were addressed. A time out was performed prior to the initiation of the procedure. Maximal barrier sterile technique utilized including caps, mask, sterile gowns, sterile gloves, large sterile drape, hand hygiene, and Betadine prep. Under sterile conditions and local anesthesia, ultrasound micropuncture access performed of the patent right common femoral artery. Five French sheath inserted. C2 catheter advanced into the aorta. Over a Glidewire, celiac origin was selected. Celiac angiogram: Celiac origin is widely patent. The left gastric, splenic and hepatic vasculature are all patent. Catheter was advanced over a Glidewire into the common hepatic artery. Common hepatic angiogram performed. Common hepatic angiogram: Common, proper, right and left hepatic arteries are patent. Origin of the GDA is visualized and appears partially occluded proximally. Renegade micro catheter advanced through the C2 catheter into the proximal  GDA. GDA angiogram: Contrast injection demonstrates extravasation of contrast into the duodenum compatible with a bleeding duodenal ulcer by endoscopy. Micro catheter was retracted back into the irregular eroded appearing gastroduodenal artery. Coil embolization performed of the GDA. Embolization: Through the Renegade catheter,  micro coil embolization performed of the proximal GDA by deploying 1 3 mm x 6 mm and 2 4 mm x 4 cm microcoils. Post embolization angiogram confirms complete stasis of the proximal GDA. No further active bleeding demonstrated. Micro catheter removed. C2 catheter retracted and utilized to select the SMA. SMA angiogram performed. SMA angiogram: SMA is widely patent. Jejunal and colic branches are patent. No active bleeding demonstrated from the inferior pancreaticoduodenal arcade. Right common femoral artery sheath injected. This confirms access within the common femoral artery. Hemostasis obtained with the Exoseal device. No immediate complication. Patient tolerated the procedure well. IMPRESSION: Active bleeding from the large duodenal ulcer eroding into the gastroduodenal artery. Successful micro coil embolization of the proximal GDA with 3 and 4 mm interlock micro coils as above. Electronically Signed   By: Jerilynn Mages.  Shick M.D.   On: 06/03/2015 14:27   [place] Guide Roadmapping  06/03/2015  INDICATION: GI bleeding duodenal ulcer confirmed by endoscopy EXAM: IR ULTRASOUND GUIDANCE VASC ACCESS RIGHT; SELECTIVE VISCERAL ARTERIOGRAPHY; ARTERIOGRAPHY; ADDITIONAL ARTERIOGRAPHY; IR EMBO ART VEN HEMORR LYMPH EXTRAV INC GUIDE ROADMAPPING MEDICATIONS: None. ANESTHESIA/SEDATION: Moderate (conscious) sedation was employed during this procedure. A total of Versed 0.5 mg and Fentanyl 25 mcg was administered intravenously. Moderate Sedation Time: 50 minutes. The patient's level of consciousness and vital signs were monitored continuously by radiology nursing throughout the  procedure under my direct supervision. CONTRAST:  130 cc Omnipaque 3 under FLUOROSCOPY TIME:  Fluoroscopy Time: 9 minutes 30 seconds (947 mGy). COMPLICATIONS: None immediate. PROCEDURE: Informed consent was obtained from the patient following explanation of the procedure, risks, benefits and alternatives. The patient understands, agrees and consents for the procedure. All questions were addressed. A time out was performed prior to the initiation of the procedure. Maximal barrier sterile technique utilized including caps, mask, sterile gowns, sterile gloves, large sterile drape, hand hygiene, and Betadine prep. Under sterile conditions and local anesthesia, ultrasound micropuncture access performed of the patent right common femoral artery. Five French sheath inserted. C2 catheter advanced into the aorta. Over a Glidewire, celiac origin was selected. Celiac angiogram: Celiac origin is widely patent. The left gastric, splenic and hepatic vasculature are all patent. Catheter was advanced over a Glidewire into the common hepatic artery. Common hepatic angiogram performed. Common hepatic angiogram: Common, proper, right and left hepatic arteries are patent. Origin of the GDA is visualized and appears partially occluded proximally. Renegade micro catheter advanced through the C2 catheter into the proximal GDA. GDA angiogram: Contrast injection demonstrates extravasation of contrast into the duodenum compatible with a bleeding duodenal ulcer by endoscopy. Micro catheter was retracted back into the irregular eroded appearing gastroduodenal artery. Coil embolization performed of the GDA. Embolization: Through the Renegade catheter, micro coil embolization performed of the proximal GDA by deploying 1 3 mm x 6 mm and 2 4 mm x 4 cm microcoils. Post embolization angiogram confirms complete stasis of the proximal GDA. No further active bleeding demonstrated. Micro catheter removed. C2 catheter retracted and utilized to select the  SMA. SMA angiogram performed. SMA angiogram: SMA is widely patent. Jejunal and colic branches are patent. No active bleeding demonstrated from the inferior pancreaticoduodenal arcade. Right common femoral artery sheath injected. This confirms access within the common femoral artery. Hemostasis obtained with the Exoseal device. No immediate complication. Patient tolerated the procedure well. IMPRESSION: Active bleeding from the large duodenal ulcer eroding into the gastroduodenal artery. Successful micro coil embolization of the proximal GDA with 3  and 4 mm interlock micro coils as above. Electronically Signed   By: Jerilynn Mages.  Shick M.D.   On: 06/03/2015 14:27        Filed Weights   06/02/15 0518 06/02/15 0525 06/03/15 0414  Weight: 77.7 kg (171 lb 4.8 oz) 77.7 kg (171 lb 4.8 oz) 83 kg (182 lb 15.7 oz)     Microbiology: Recent Results (from the past 240 hour(s))  MRSA PCR Screening     Status: None   Collection Time: 06/02/15  8:05 AM  Result Value Ref Range Status   MRSA by PCR NEGATIVE NEGATIVE Final    Comment:        The GeneXpert MRSA Assay (FDA approved for NASAL specimens only), is one component of a comprehensive MRSA colonization surveillance program. It is not intended to diagnose MRSA infection nor to guide or monitor treatment for MRSA infections.        Blood Culture    Component Value Date/Time   SDES URINE, CLEAN CATCH 02/19/2015 1320   SPECREQUEST NONE 02/19/2015 1320   CULT  02/19/2015 1320    2,000 COLONIES/mL INSIGNIFICANT GROWTH Performed at [place] 02/21/2015 FINAL 02/19/2015 1320      Labs: Results for orders placed or performed during the hospital encounter of 06/02/15 (from the past 48 hour(s))  Comprehensive metabolic panel     Status: Abnormal   Collection Time: 06/03/15  1:35 PM  Result Value Ref Range   Sodium 140 135 - 145 mmol/L   Potassium 4.8 3.5 - 5.1 mmol/L   Chloride 110 101 - 111 mmol/L   CO2 24 22 - 32  mmol/L   Glucose, Bld 111 (H) 65 - 99 mg/dL   BUN 12 6 - 20 mg/dL   Creatinine, Ser 1.36 (H) 0.61 - 1.24 mg/dL   Calcium 8.4 (L) 8.9 - 10.3 mg/dL   Total Protein 4.9 (L) 6.5 - 8.1 g/dL   Albumin 2.3 (L) 3.5 - 5.0 g/dL   AST 21 15 - 41 U/L   ALT 13 (L) 17 - 63 U/L   Alkaline Phosphatase 93 38 - 126 U/L   Total Bilirubin 0.4 0.3 - 1.2 mg/dL   GFR calc non Af Amer 49 (L) >60 mL/min   GFR calc Af Amer 56 (L) >60 mL/min    Comment: (NOTE) The eGFR has been calculated using the CKD EPI equation. This calculation has not been validated in all clinical situations. eGFR's persistently <60 mL/min signify possible Chronic Kidney Disease.    Anion gap 6 5 - 15  Magnesium     Status: None   Collection Time: 06/03/15  1:35 PM  Result Value Ref Range   Magnesium 2.3 1.7 - 2.4 mg/dL  Protime-INR     Status: Abnormal   Collection Time: 06/03/15  1:35 PM  Result Value Ref Range   Prothrombin Time 16.1 (H) 11.6 - 15.2 seconds   INR 1.28 0.00 - 1.49  Type and screen [place]     Status: None   Collection Time: 06/03/15  1:35 PM  Result Value Ref Range   ABO/RH(D) O POS    Antibody Screen NEG    Sample Expiration 06/06/2015    Unit Number [id number]    Blood Component Type RBC LR PHER2    Unit division 00    Status of Unit ISSUED,FINAL    Transfusion Status OK TO TRANSFUSE    Crossmatch Result Compatible    Unit Number [id number]  Blood Component Type RBC LR PHER1    Unit division 00    Status of Unit ISSUED,FINAL    Transfusion Status OK TO TRANSFUSE    Crossmatch Result Compatible   ABO/Rh     Status: None   Collection Time: 06/03/15  1:35 PM  Result Value Ref Range   ABO/RH(D) O POS   CBC     Status: Abnormal   Collection Time: 06/04/15  6:00 AM  Result Value Ref Range   WBC 7.3 4.0 - 10.5 K/uL   RBC 3.27 (L) 4.22 - 5.81 MIL/uL   Hemoglobin 10.2 (L) 13.0 - 17.0 g/dL    Comment: POST TRANSFUSION SPECIMEN   HCT 29.1 (L) 39.0 - 52.0 %   MCV 89.0 78.0  - 100.0 fL   MCH 31.2 26.0 - 34.0 pg   MCHC 35.1 30.0 - 36.0 g/dL   RDW 15.9 (H) 11.5 - 15.5 %   Platelets 161 150 - 400 K/uL  Comprehensive metabolic panel     Status: Abnormal   Collection Time: 06/04/15  6:00 AM  Result Value Ref Range   Sodium 140 135 - 145 mmol/L   Potassium 4.6 3.5 - 5.1 mmol/L   Chloride 111 101 - 111 mmol/L   CO2 23 22 - 32 mmol/L   Glucose, Bld 102 (H) 65 - 99 mg/dL   BUN 9 6 - 20 mg/dL   Creatinine, Ser 1.26 (H) 0.61 - 1.24 mg/dL   Calcium 8.1 (L) 8.9 - 10.3 mg/dL   Total Protein 4.6 (L) 6.5 - 8.1 g/dL   Albumin 2.1 (L) 3.5 - 5.0 g/dL   AST 17 15 - 41 U/L   ALT 14 (L) 17 - 63 U/L   Alkaline Phosphatase 89 38 - 126 U/L   Total Bilirubin 0.7 0.3 - 1.2 mg/dL   GFR calc non Af Amer 53 (L) >60 mL/min   GFR calc Af Amer >60 >60 mL/min    Comment: (NOTE) The eGFR has been calculated using the CKD EPI equation. This calculation has not been validated in all clinical situations. eGFR's persistently <60 mL/min signify possible Chronic Kidney Disease.    Anion gap 6 5 - 15  CBC     Status: Abnormal   Collection Time: 06/04/15  6:11 PM  Result Value Ref Range   WBC 5.7 4.0 - 10.5 K/uL   RBC 2.22 (L) 4.22 - 5.81 MIL/uL   Hemoglobin 6.7 (LL) 13.0 - 17.0 g/dL    Comment: REPEATED TO VERIFY CRITICAL RESULT CALLED TO, READ BACK BY AND VERIFIED WITH: [facility] 06/04/15 1835 RHOLMES    HCT 20.0 (L) 39.0 - 52.0 %   MCV 90.1 78.0 - 100.0 fL   MCH 30.2 26.0 - 34.0 pg   MCHC 33.5 30.0 - 36.0 g/dL   RDW 15.9 (H) 11.5 - 15.5 %   Platelets 116 (L) 150 - 400 K/uL    Comment: REPEATED TO VERIFY PLATELET COUNT CONFIRMED BY SMEAR   CBC with Differential/Platelet     Status: Abnormal   Collection Time: 06/04/15  6:40 PM  Result Value Ref Range   WBC 7.8 4.0 - 10.5 K/uL   RBC 3.49 (L) 4.22 - 5.81 MIL/uL   Hemoglobin 10.3 (L) 13.0 - 17.0 g/dL    Comment: REPEATED TO VERIFY DELTA CHECK NOTED    HCT 31.2 (L) 39.0 - 52.0 %   MCV 89.4 78.0 - 100.0 fL   MCH 29.5  26.0 - 34.0 pg   MCHC 33.0 30.0 -  36.0 g/dL   RDW 15.8 (H) 11.5 - 15.5 %   Platelets 181 150 - 400 K/uL   Neutrophils Relative % 77 %   Neutro Abs 6.0 1.7 - 7.7 K/uL   Lymphocytes Relative 10 %   Lymphs Abs 0.8 0.7 - 4.0 K/uL   Monocytes Relative 11 %   Monocytes Absolute 0.8 0.1 - 1.0 K/uL   Eosinophils Relative 2 %   Eosinophils Absolute 0.2 0.0 - 0.7 K/uL   Basophils Relative 0 %   Basophils Absolute 0.0 0.0 - 0.1 K/uL  CBC     Status: Abnormal   Collection Time: 06/05/15  6:30 AM  Result Value Ref Range   WBC 6.0 4.0 - 10.5 K/uL   RBC 3.34 (L) 4.22 - 5.81 MIL/uL   Hemoglobin 10.2 (L) 13.0 - 17.0 g/dL   HCT 29.8 (L) 39.0 - 52.0 %   MCV 89.2 78.0 - 100.0 fL   MCH 30.5 26.0 - 34.0 pg   MCHC 34.2 30.0 - 36.0 g/dL   RDW 15.4 11.5 - 15.5 %   Platelets 184 150 - 400 K/uL  Comprehensive metabolic panel     Status: Abnormal   Collection Time: 06/05/15  6:30 AM  Result Value Ref Range   Sodium 138 135 - 145 mmol/L   Potassium 4.2 3.5 - 5.1 mmol/L   Chloride 110 101 - 111 mmol/L   CO2 24 22 - 32 mmol/L   Glucose, Bld 111 (H) 65 - 99 mg/dL   BUN 6 6 - 20 mg/dL   Creatinine, Ser 1.21 0.61 - 1.24 mg/dL   Calcium 8.3 (L) 8.9 - 10.3 mg/dL   Total Protein 4.9 (L) 6.5 - 8.1 g/dL   Albumin 2.2 (L) 3.5 - 5.0 g/dL   AST 18 15 - 41 U/L   ALT 12 (L) 17 - 63 U/L   Alkaline Phosphatase 95 38 - 126 U/L   Total Bilirubin 0.6 0.3 - 1.2 mg/dL   GFR calc non Af Amer 56 (L) >60 mL/min   GFR calc Af Amer >60 >60 mL/min    Comment: (NOTE) The eGFR has been calculated using the CKD EPI equation. This calculation has not been validated in all clinical situations. eGFR's persistently <60 mL/min signify possible Chronic Kidney Disease.    Anion gap 4 (L) 5 - 15     Lipid Panel     Component Value Date/Time   CHOL 253* 09/05/2014 1530   TRIG 330* 09/05/2014 1530   HDL 30* 09/05/2014 1530   CHOLHDL 8.4 09/05/2014 1530   VLDL 66* 09/05/2014 1530   LDLCALC 157* 09/05/2014 1530     No  results found for: HGBA1C   Lab Results  Component Value Date   LDLCALC 157* 09/05/2014   CREATININE 1.21 06/05/2015     History of present illness 73 yom with past medical history of pancreatic cancer and colonoscopy 2015 that revealed mild sigmoid diverticulosis and internal hemorrhoids. Presented with complaints of bloody stools and LOC. [name] any injuries during LOC and reports passing dark marron colored stools and clots. Before arriving to [facility] he presented to [facility] for abdominal pain where he was cleared for release after unremarkable labs and CT scan. In the ED noted to be heme positive with Hgb 8.7. Hemoglobin dropped to 6.8. Patient was given 2 units of packed red blood cells and hemoglobin improved to 9.0. Patient transferred to [facility] stepdown unit. He developed SVT on 5/13  with hemodynamic instability  Assessment/Plan: GI  bleed with acute blood loss anemia. Hemoglobin dropped from 9.0-7.7 on 5/13, received 2 more units, now stable around 10.2. Patient has received a total of 4 units this admission. Dr. Paulita Fujita from gastroenterology was consulted, patient was maintained on a Protonix drip .Dr. Oneida Alar. Saw the  patient at [facility], found large duodenal ulcer, she recommends transfer to [facility].   Dr. Paulita Fujita  recommended interventional radiology management if bleeding recurs. Interventional radiology Dr. Reesa Chew  was consulted. To control any further bleeding episodes, patient is status post Successful coil embolization of gastroduodenal artery . Hemoglobin has been stable after the procedure the patient has been hemodynamically stable. Patient needs to continue with twice a day PPI and follow-up with gastroenterology and oncology  Acute blood loss anemia. Status post transfusion of 4 units of packed red blood cells.   Acute kidney injury superimposed on chronic kidney disease stage III. Improving with volume resuscitation   Syncope secondary to vasovagal episode.  Follow up orthostatic BP. Troponin negative. Found to be in SVT 5/13 due to ongoing bleeding. Converted to normal sinus rhythm with IV Cardizem, fluid resuscitation, transfusion of packed red blood cells. The patient has been stable for 48 hours prior to discharge  SVT-converted to atrial fibrillation and subsequently sinus rhythm with IV Cardizem,   Barretts esophagus, continue PPI.   Pancreatic carcinoma, no change noted in CT A/P with contrast 5/11. Recently seen by his oncologist at [facility] last month. Patient tolerated chemotherapy poorly and declined any further treatment. He had ERCP with metal stent placement back in February 2017 at [facility]. 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  Gout, stable.     Discharge Exam:    Blood pressure 120/69, pulse 89, temperature 98.6 F (37 C), temperature source Oral, resp. rate 24, height 5' 9"  (1.753 m), weight 83 kg (182 lb 15.7 oz), SpO2 95 %.  General: Alert, pleasant and cooperative in NAD Head: Normocephalic and atraumatic. Neck: Supple; Lungs: Clear throughout to auscultation.  Heart: Regular rate and rhythm. Abdomen: Soft, nontender and nondistended. Normal bowel sounds, without guarding, and without rebound.  Neurologic: Alert and oriented x4; grossly normal neurologically.    Follow-up Information    Follow up with [name], MD. Schedule an appointment as soon as possible for a visit in 3 days.   Specialty:  Family Medicine   Contact information:   [address] [place] [address] [phone]       Follow up with Landry Dyke, MD. Schedule an appointment as soon as possible for a visit in 1 week.   Specialty:  Gastroenterology   Contact information:   [address] [place] [address] [phone]       Signed: Reyne Dumas 06/05/2015, 9:36 AM        Time spent >45 mins

## 2015-06-05 NOTE — Care Management Note (Signed)
Case Management Note  Patient Details  Name: William Rivera MRN: [id number] Date of Birth: 1935-03-10  Subjective/Objective:     Patient is from home with spouse, pta indep, presents with GIB, has a porta cath in place, for pancreatic ca, has mets to lung.  Patient had coiling a few days ago.  NCM will cont to follow for dc needs.                Action/Plan:   Expected Discharge Date:                  Expected Discharge Plan:  Home/Self Care  In-House Referral:  NA  Discharge planning Services  CM Consult  Post Acute Care Choice:  NA Choice offered to:  NA  DME Arranged:    DME Agency:     HH Arranged:    HH Agency:     Status of Service:  Completed, signed off  Medicare Important Message Given:  Yes Date Medicare IM Given:    Medicare IM give by:    Date Additional Medicare IM Given:    Additional Medicare Important Message give by:     If discussed at [place] of Stay Meetings, dates discussed:    Additional Comments:  Zenon Mayo, RN 06/05/2015, 3:39 PM

## 2015-06-06 ENCOUNTER — Encounter (HOSPITAL_COMMUNITY): Payer: Self-pay | Admitting: Gastroenterology

## 2015-06-06 LAB — CBC
HEMATOCRIT: 29.5 % — AB (ref 39.0–52.0)
HEMATOCRIT: 29.8 % — AB (ref 39.0–52.0)
HEMOGLOBIN: 10.3 g/dL — AB (ref 13.0–17.0)
Hemoglobin: 9.9 g/dL — ABNORMAL LOW (ref 13.0–17.0)
MCH: 30.3 pg (ref 26.0–34.0)
MCH: 31.2 pg (ref 26.0–34.0)
MCHC: 33.6 g/dL (ref 30.0–36.0)
MCHC: 34.6 g/dL (ref 30.0–36.0)
MCV: 90.2 fL (ref 78.0–100.0)
MCV: 90.3 fL (ref 78.0–100.0)
Platelets: 174 10*3/uL (ref 150–400)
Platelets: 162 10*3/uL (ref 150–400)
RBC: 3.27 MIL/uL — ABNORMAL LOW (ref 4.22–5.81)
RBC: 3.3 MIL/uL — ABNORMAL LOW (ref 4.22–5.81)
RDW: 15.2 % (ref 11.5–15.5)
RDW: 15.2 % (ref 11.5–15.5)
WBC: 5.2 10*3/uL (ref 4.0–10.5)
WBC: 5.2 10*3/uL (ref 4.0–10.5)

## 2015-06-06 MED ORDER — HEPARIN SOD (PORK) LOCK FLUSH 100 UNIT/ML IV SOLN
500.0000 [IU] | INTRAVENOUS | Status: AC | PRN
  Administered 2015-06-06: 500 [IU]

## 2015-06-06 MED ORDER — FERROUS SULFATE 300 (60 FE) MG/5ML PO SYRP: 300.0000 mg | ORAL_SOLUTION | Freq: Every day | ORAL | Status: DC

## 2015-06-06 MED ORDER — POLYETHYLENE GLYCOL 3350 17 G PO PACK: 17.0000 g | PACK | Freq: Every day | ORAL | Status: DC

## 2015-06-06 NOTE — Progress Notes (Signed)
[facility] PROGRESS NOTE Subjective patient feels fine, is tolerating diet and reports that his bleeding has resolved.  Objective: Vital signs in last 24 hours: Temp:  [98 F (36.7 C)-98.6 F (37 C)] 98.3 F (36.8 C) (05/16 0800) Pulse Rate:  [86-107] 95 (05/16 0800) BP: (111-142)/(72-97) 129/82 mmHg (05/16 0800) SpO2:  [95 %-98 %] 98 % (05/16 0800) Weight:  [80.1 kg (176 lb 9.4 oz)] 80.1 kg (176 lb 9.4 oz) (05/16 0500) Last BM Date: 06/05/15  Intake/Output from previous day: 05/15 0701 - 05/16 0700 In: 2608 [P.O.:480; I.V.:2128] Out: 3 [Stool:3] Intake/Output this shift: Total I/O In: 1015.4 [I.V.:1015.4] Out: -   PE: General-- Brown stool in the bedside toilet with no blood   Lab Results:  Recent Labs  06/04/15 1811 06/04/15 1840 06/05/15 0630 06/05/15 1249 06/06/15 0530  WBC 5.7 7.8 6.0 6.8 5.2  HGB 6.7* 10.3* 10.2* 11.6* 9.9*  HCT 20.0* 31.2* 29.8* 34.3* 29.5*  PLT 116* 181 184 194 174   BMET  Recent Labs  06/03/15 1335 06/04/15 0600 06/05/15 0630  NA 140 140 138  K 4.8 4.6 4.2  CL 110 111 110  CO2 24 23 24   CREATININE 1.36* 1.26* 1.21   LFT  Recent Labs  06/03/15 1335 06/04/15 0600 06/05/15 0630  PROT 4.9* 4.6* 4.9*  AST 21 17 18   ALT 13* 14* 12*  ALKPHOS 93 89 95  BILITOT 0.4 0.7 0.6   PT/INR  Recent Labs  06/03/15 1335  LABPROT 16.1*  INR 1.28   PANCREAS No results for input(s): LIPASE in the last 72 hours.       Studies/Results: No results found.  Medications: I have reviewed the patient's current medications.  Assessment/Plan: 1. Bleeding duodenal ulcer. Status post embolization by IR. His hemoglobin does seem to go up and down but he has had no further transfusions in several days and his stools are brown. I don't think he needs any further intervention at this time. He could be discharged and followed up by his gastroenterologists in [place], Dr. Oneida Alar and by oncology over at [facility]. We will  sign off and sea again as needed.   Erma Joubert JR,Alanzo Lamb L 06/06/2015, 9:41 AM  This note was created using voice recognition software. Minor errors may Have occurred unintentionally.  Pager: [phone] If no answer or after hours call [phone]

## 2015-06-06 NOTE — Care Management Note (Signed)
Case Management Note  Patient Details  Name: William Rivera MRN: [id number] Date of Birth: 11/08/1935  Subjective/Objective:   Patient is for dc today, no needs.                 Action/Plan:   Expected Discharge Date:                  Expected Discharge Plan:  Home/Self Care  In-House Referral:  NA  Discharge planning Services  CM Consult  Post Acute Care Choice:  NA Choice offered to:  NA  DME Arranged:    DME Agency:     HH Arranged:    HH Agency:     Status of Service:  Completed, signed off  Medicare Important Message Given:  Yes Date Medicare IM Given:    Medicare IM give by:    Date Additional Medicare IM Given:    Additional Medicare Important Message give by:     If discussed at [place] of Stay Meetings, dates discussed:    Additional Comments:  Zenon Mayo, RN 06/06/2015, 12:01 PM

## 2015-06-06 NOTE — Discharge Summary (Signed)
Physician Discharge Summary  William Rivera MRN: [id number] DOB/AGE: [birth year] 80 y.o.  PCP: [name], MD   Admit date: 06/02/2015 Discharge date: 06/06/2015  Discharge Diagnoses:     Principal Problem:   GI bleeding Active Problems:   Pancreatic cancer ([place])   Acute blood loss anemia   AKI (acute kidney injury) ([place])   Syncope   Hematochezia   Protein-calorie malnutrition, severe   Palliative care encounter   Duodenal ulcer with hemorrhage        Follow-up recommendations Follow-up with PCP in 3-5 days , including all  additional recommended appointments as below Follow-up CBC, CMP in 3-5 days Patient would like to continue following up with his oncologist at [facility] for further recommendations regarding his pancreatic cancer       Current Discharge Medication List    START taking these medications   Details  feeding supplement (BOOST / RESOURCE BREEZE) LIQD Take 1 Container by mouth 3 (three) times daily with meals. Qty: 90 Container, Refills: 0    ferrous sulfate 300 (60 Fe) MG/5ML syrup Take 5 mLs (300 mg total) by mouth daily. Qty: 150 mL, Refills: 3      CONTINUE these medications which have CHANGED   Details  HYDROcodone-acetaminophen (NORCO/VICODIN) 5-325 MG tablet Take 1 tablet by mouth every 4 (four) hours as needed for moderate pain. Qty: 30 tablet, Refills: 0    pantoprazole (PROTONIX) 40 MG tablet Take 1 tablet (40 mg total) by mouth 2 (two) times daily. Qty: 80 tablet, Refills: 1      CONTINUE these medications which have NOT CHANGED   Details  Artificial Tear Ointment (DRY EYES OP) Apply 1-2 drops to eye daily as needed (dry eyes).    cetirizine-pseudoephedrine (ZYRTEC-D) 5-120 MG tablet Take 1 tablet by mouth daily as needed for allergies.    CREON 24000 units CPEP Take 2 capsules by mouth 4 (four) times daily. Take 2 capsules with meals and 1 capsule with snacks. Refills: 1    ondansetron (ZOFRAN) 8 MG tablet Take 8 mg by  mouth every 8 (eight) hours as needed for nausea or vomiting.     simethicone (MYLICON) 449 MG chewable tablet Chew 125 mg by mouth every 6 (six) hours as needed for flatulence.      STOP taking these medications     ranitidine (ZANTAC 150 MAXIMUM STRENGTH) 150 MG tablet      sodium chloride (OCEAN) 0.65 % SOLN nasal spray      oxyCODONE (OXY IR/ROXICODONE) 5 MG immediate release tablet          Discharge Condition: Overall prognosis poor   Discharge Instructions Get Medicines reviewed and adjusted: Please take all your medications with you for your next visit with your Primary MD  Please request your Primary MD to go over all hospital tests and procedure/radiological results at the follow up, please ask your Primary MD to get all Hospital records sent to his/her office.  If you experience worsening of your admission symptoms, develop shortness of breath, life threatening emergency, suicidal or homicidal thoughts you must seek medical attention immediately by calling 911 or calling your MD immediately if symptoms less severe.  You must read complete instructions/literature along with all the possible adverse reactions/side effects for all the Medicines you take and that have been prescribed to you. Take any new Medicines after you have completely understood and accpet all the possible adverse reactions/side effects.   Do not drive when taking Pain medications.   Do  not take more than prescribed Pain, Sleep and Anxiety Medications  Special Instructions: If you have smoked or chewed Tobacco in the last 2 yrs please stop smoking, stop any regular Alcohol and or any Recreational drug use.  Wear Seat belts while driving.  Please note  You were cared for by a hospitalist during your hospital stay. Once you are discharged, your primary care physician will handle any further medical issues. Please note that NO REFILLS for any discharge medications will be authorized once you are  discharged, as it is imperative that you return to your primary care physician (or establish a relationship with a primary care physician if you do not have one) for your aftercare needs so that they can reassess your need for medications and monitor your lab values.  Discharge Instructions    Diet - low sodium heart healthy    Complete by:  As directed      Increase activity slowly    Complete by:  As directed             Allergies  Allergen Reactions  . Doxycycline Itching    Severe itching   . Colestipol Itching  . Doxycycline Rash      Disposition: 01-Home or Self Care   Consults:  Gastroenterology     Significant Diagnostic Studies:  Ir Angiogram Visceral Selective  06/03/2015  INDICATION: GI bleeding duodenal ulcer confirmed by endoscopy EXAM: IR ULTRASOUND GUIDANCE VASC ACCESS RIGHT; SELECTIVE VISCERAL ARTERIOGRAPHY; ARTERIOGRAPHY; ADDITIONAL ARTERIOGRAPHY; IR EMBO ART VEN HEMORR LYMPH EXTRAV INC GUIDE ROADMAPPING MEDICATIONS: None. ANESTHESIA/SEDATION: Moderate (conscious) sedation was employed during this procedure. A total of Versed 0.5 mg and Fentanyl 25 mcg was administered intravenously. Moderate Sedation Time: 50 minutes. The patient's level of consciousness and vital signs were monitored continuously by radiology nursing throughout the procedure under my direct supervision. CONTRAST:  130 cc Omnipaque 3 under FLUOROSCOPY TIME:  Fluoroscopy Time: 9 minutes 30 seconds (947 mGy). COMPLICATIONS: None immediate. PROCEDURE: Informed consent was obtained from the patient following explanation of the procedure, risks, benefits and alternatives. The patient understands, agrees and consents for the procedure. All questions were addressed. A time out was performed prior to the initiation of the procedure. Maximal barrier sterile technique utilized including caps, mask, sterile gowns, sterile gloves, large sterile drape, hand hygiene, and Betadine prep. Under sterile conditions  and local anesthesia, ultrasound micropuncture access performed of the patent right common femoral artery. Five French sheath inserted. C2 catheter advanced into the aorta. Over a Glidewire, celiac origin was selected. Celiac angiogram: Celiac origin is widely patent. The left gastric, splenic and hepatic vasculature are all patent. Catheter was advanced over a Glidewire into the common hepatic artery. Common hepatic angiogram performed. Common hepatic angiogram: Common, proper, right and left hepatic arteries are patent. Origin of the GDA is visualized and appears partially occluded proximally. Renegade micro catheter advanced through the C2 catheter into the proximal GDA. GDA angiogram: Contrast injection demonstrates extravasation of contrast into the duodenum compatible with a bleeding duodenal ulcer by endoscopy. Micro catheter was retracted back into the irregular eroded appearing gastroduodenal artery. Coil embolization performed of the GDA. Embolization: Through the Renegade catheter, micro coil embolization performed of the proximal GDA by deploying 1 3 mm x 6 mm and 2 4 mm x 4 cm microcoils. Post embolization angiogram confirms complete stasis of the proximal GDA. No further active bleeding demonstrated. Micro catheter removed. C2 catheter retracted and utilized to select the SMA. SMA angiogram performed. SMA angiogram: SMA  is widely patent. Jejunal and colic branches are patent. No active bleeding demonstrated from the inferior pancreaticoduodenal arcade. Right common femoral artery sheath injected. This confirms access within the common femoral artery. Hemostasis obtained with the Exoseal device. No immediate complication. Patient tolerated the procedure well. IMPRESSION: Active bleeding from the large duodenal ulcer eroding into the gastroduodenal artery. Successful micro coil embolization of the proximal GDA with 3 and 4 mm interlock micro coils as above. Electronically Signed   By: Jerilynn Mages.  Shick M.D.    On: 06/03/2015 14:27   Ir Angiogram Visceral Selective  06/03/2015  INDICATION: GI bleeding duodenal ulcer confirmed by endoscopy EXAM: IR ULTRASOUND GUIDANCE VASC ACCESS RIGHT; SELECTIVE VISCERAL ARTERIOGRAPHY; ARTERIOGRAPHY; ADDITIONAL ARTERIOGRAPHY; IR EMBO ART VEN HEMORR LYMPH EXTRAV INC GUIDE ROADMAPPING MEDICATIONS: None. ANESTHESIA/SEDATION: Moderate (conscious) sedation was employed during this procedure. A total of Versed 0.5 mg and Fentanyl 25 mcg was administered intravenously. Moderate Sedation Time: 50 minutes. The patient's level of consciousness and vital signs were monitored continuously by radiology nursing throughout the procedure under my direct supervision. CONTRAST:  130 cc Omnipaque 3 under FLUOROSCOPY TIME:  Fluoroscopy Time: 9 minutes 30 seconds (947 mGy). COMPLICATIONS: None immediate. PROCEDURE: Informed consent was obtained from the patient following explanation of the procedure, risks, benefits and alternatives. The patient understands, agrees and consents for the procedure. All questions were addressed. A time out was performed prior to the initiation of the procedure. Maximal barrier sterile technique utilized including caps, mask, sterile gowns, sterile gloves, large sterile drape, hand hygiene, and Betadine prep. Under sterile conditions and local anesthesia, ultrasound micropuncture access performed of the patent right common femoral artery. Five French sheath inserted. C2 catheter advanced into the aorta. Over a Glidewire, celiac origin was selected. Celiac angiogram: Celiac origin is widely patent. The left gastric, splenic and hepatic vasculature are all patent. Catheter was advanced over a Glidewire into the common hepatic artery. Common hepatic angiogram performed. Common hepatic angiogram: Common, proper, right and left hepatic arteries are patent. Origin of the GDA is visualized and appears partially occluded proximally. Renegade micro catheter advanced through the C2  catheter into the proximal GDA. GDA angiogram: Contrast injection demonstrates extravasation of contrast into the duodenum compatible with a bleeding duodenal ulcer by endoscopy. Micro catheter was retracted back into the irregular eroded appearing gastroduodenal artery. Coil embolization performed of the GDA. Embolization: Through the Renegade catheter, micro coil embolization performed of the proximal GDA by deploying 1 3 mm x 6 mm and 2 4 mm x 4 cm microcoils. Post embolization angiogram confirms complete stasis of the proximal GDA. No further active bleeding demonstrated. Micro catheter removed. C2 catheter retracted and utilized to select the SMA. SMA angiogram performed. SMA angiogram: SMA is widely patent. Jejunal and colic branches are patent. No active bleeding demonstrated from the inferior pancreaticoduodenal arcade. Right common femoral artery sheath injected. This confirms access within the common femoral artery. Hemostasis obtained with the Exoseal device. No immediate complication. Patient tolerated the procedure well. IMPRESSION: Active bleeding from the large duodenal ulcer eroding into the gastroduodenal artery. Successful micro coil embolization of the proximal GDA with 3 and 4 mm interlock micro coils as above. Electronically Signed   By: Jerilynn Mages.  Shick M.D.   On: 06/03/2015 14:27   Ir Angiogram Selective Each Additional Vessel  06/03/2015  INDICATION: GI bleeding duodenal ulcer confirmed by endoscopy EXAM: IR ULTRASOUND GUIDANCE VASC ACCESS RIGHT; SELECTIVE VISCERAL ARTERIOGRAPHY; ARTERIOGRAPHY; ADDITIONAL ARTERIOGRAPHY; IR EMBO ART VEN HEMORR LYMPH EXTRAV INC GUIDE ROADMAPPING MEDICATIONS:  None. ANESTHESIA/SEDATION: Moderate (conscious) sedation was employed during this procedure. A total of Versed 0.5 mg and Fentanyl 25 mcg was administered intravenously. Moderate Sedation Time: 50 minutes. The patient's level of consciousness and vital signs were monitored continuously by radiology nursing  throughout the procedure under my direct supervision. CONTRAST:  130 cc Omnipaque 3 under FLUOROSCOPY TIME:  Fluoroscopy Time: 9 minutes 30 seconds (947 mGy). COMPLICATIONS: None immediate. PROCEDURE: Informed consent was obtained from the patient following explanation of the procedure, risks, benefits and alternatives. The patient understands, agrees and consents for the procedure. All questions were addressed. A time out was performed prior to the initiation of the procedure. Maximal barrier sterile technique utilized including caps, mask, sterile gowns, sterile gloves, large sterile drape, hand hygiene, and Betadine prep. Under sterile conditions and local anesthesia, ultrasound micropuncture access performed of the patent right common femoral artery. Five French sheath inserted. C2 catheter advanced into the aorta. Over a Glidewire, celiac origin was selected. Celiac angiogram: Celiac origin is widely patent. The left gastric, splenic and hepatic vasculature are all patent. Catheter was advanced over a Glidewire into the common hepatic artery. Common hepatic angiogram performed. Common hepatic angiogram: Common, proper, right and left hepatic arteries are patent. Origin of the GDA is visualized and appears partially occluded proximally. Renegade micro catheter advanced through the C2 catheter into the proximal GDA. GDA angiogram: Contrast injection demonstrates extravasation of contrast into the duodenum compatible with a bleeding duodenal ulcer by endoscopy. Micro catheter was retracted back into the irregular eroded appearing gastroduodenal artery. Coil embolization performed of the GDA. Embolization: Through the Renegade catheter, micro coil embolization performed of the proximal GDA by deploying 1 3 mm x 6 mm and 2 4 mm x 4 cm microcoils. Post embolization angiogram confirms complete stasis of the proximal GDA. No further active bleeding demonstrated. Micro catheter removed. C2 catheter retracted and  utilized to select the SMA. SMA angiogram performed. SMA angiogram: SMA is widely patent. Jejunal and colic branches are patent. No active bleeding demonstrated from the inferior pancreaticoduodenal arcade. Right common femoral artery sheath injected. This confirms access within the common femoral artery. Hemostasis obtained with the Exoseal device. No immediate complication. Patient tolerated the procedure well. IMPRESSION: Active bleeding from the large duodenal ulcer eroding into the gastroduodenal artery. Successful micro coil embolization of the proximal GDA with 3 and 4 mm interlock micro coils as above. Electronically Signed   By: Jerilynn Mages.  Shick M.D.   On: 06/03/2015 14:27   Ir Angiogram Follow Up Study  06/03/2015  INDICATION: GI bleeding duodenal ulcer confirmed by endoscopy EXAM: IR ULTRASOUND GUIDANCE VASC ACCESS RIGHT; SELECTIVE VISCERAL ARTERIOGRAPHY; ARTERIOGRAPHY; ADDITIONAL ARTERIOGRAPHY; IR EMBO ART VEN HEMORR LYMPH EXTRAV INC GUIDE ROADMAPPING MEDICATIONS: None. ANESTHESIA/SEDATION: Moderate (conscious) sedation was employed during this procedure. A total of Versed 0.5 mg and Fentanyl 25 mcg was administered intravenously. Moderate Sedation Time: 50 minutes. The patient's level of consciousness and vital signs were monitored continuously by radiology nursing throughout the procedure under my direct supervision. CONTRAST:  130 cc Omnipaque 3 under FLUOROSCOPY TIME:  Fluoroscopy Time: 9 minutes 30 seconds (947 mGy). COMPLICATIONS: None immediate. PROCEDURE: Informed consent was obtained from the patient following explanation of the procedure, risks, benefits and alternatives. The patient understands, agrees and consents for the procedure. All questions were addressed. A time out was performed prior to the initiation of the procedure. Maximal barrier sterile technique utilized including caps, mask, sterile gowns, sterile gloves, large sterile drape, hand hygiene, and Betadine prep. Under  sterile  conditions and local anesthesia, ultrasound micropuncture access performed of the patent right common femoral artery. Five French sheath inserted. C2 catheter advanced into the aorta. Over a Glidewire, celiac origin was selected. Celiac angiogram: Celiac origin is widely patent. The left gastric, splenic and hepatic vasculature are all patent. Catheter was advanced over a Glidewire into the common hepatic artery. Common hepatic angiogram performed. Common hepatic angiogram: Common, proper, right and left hepatic arteries are patent. Origin of the GDA is visualized and appears partially occluded proximally. Renegade micro catheter advanced through the C2 catheter into the proximal GDA. GDA angiogram: Contrast injection demonstrates extravasation of contrast into the duodenum compatible with a bleeding duodenal ulcer by endoscopy. Micro catheter was retracted back into the irregular eroded appearing gastroduodenal artery. Coil embolization performed of the GDA. Embolization: Through the Renegade catheter, micro coil embolization performed of the proximal GDA by deploying 1 3 mm x 6 mm and 2 4 mm x 4 cm microcoils. Post embolization angiogram confirms complete stasis of the proximal GDA. No further active bleeding demonstrated. Micro catheter removed. C2 catheter retracted and utilized to select the SMA. SMA angiogram performed. SMA angiogram: SMA is widely patent. Jejunal and colic branches are patent. No active bleeding demonstrated from the inferior pancreaticoduodenal arcade. Right common femoral artery sheath injected. This confirms access within the common femoral artery. Hemostasis obtained with the Exoseal device. No immediate complication. Patient tolerated the procedure well. IMPRESSION: Active bleeding from the large duodenal ulcer eroding into the gastroduodenal artery. Successful micro coil embolization of the proximal GDA with 3 and 4 mm interlock micro coils as above. Electronically Signed   By: Jerilynn Mages.   Shick M.D.   On: 06/03/2015 14:27   Ir US Guide Vasc Access Right  06/03/2015  INDICATION: GI bleeding duodenal ulcer confirmed by endoscopy EXAM: IR ULTRASOUND GUIDANCE VASC ACCESS RIGHT; SELECTIVE VISCERAL ARTERIOGRAPHY; ARTERIOGRAPHY; ADDITIONAL ARTERIOGRAPHY; IR EMBO ART VEN HEMORR LYMPH EXTRAV INC GUIDE ROADMAPPING MEDICATIONS: None. ANESTHESIA/SEDATION: Moderate (conscious) sedation was employed during this procedure. A total of Versed 0.5 mg and Fentanyl 25 mcg was administered intravenously. Moderate Sedation Time: 50 minutes. The patient's level of consciousness and vital signs were monitored continuously by radiology nursing throughout the procedure under my direct supervision. CONTRAST:  130 cc Omnipaque 3 under FLUOROSCOPY TIME:  Fluoroscopy Time: 9 minutes 30 seconds (947 mGy). COMPLICATIONS: None immediate. PROCEDURE: Informed consent was obtained from the patient following explanation of the procedure, risks, benefits and alternatives. The patient understands, agrees and consents for the procedure. All questions were addressed. A time out was performed prior to the initiation of the procedure. Maximal barrier sterile technique utilized including caps, mask, sterile gowns, sterile gloves, large sterile drape, hand hygiene, and Betadine prep. Under sterile conditions and local anesthesia, ultrasound micropuncture access performed of the patent right common femoral artery. Five French sheath inserted. C2 catheter advanced into the aorta. Over a Glidewire, celiac origin was selected. Celiac angiogram: Celiac origin is widely patent. The left gastric, splenic and hepatic vasculature are all patent. Catheter was advanced over a Glidewire into the common hepatic artery. Common hepatic angiogram performed. Common hepatic angiogram: Common, proper, right and left hepatic arteries are patent. Origin of the GDA is visualized and appears partially occluded proximally. Renegade micro catheter advanced through  the C2 catheter into the proximal GDA. GDA angiogram: Contrast injection demonstrates extravasation of contrast into the duodenum compatible with a bleeding duodenal ulcer by endoscopy. Micro catheter was retracted back into the irregular eroded appearing gastroduodenal artery.  Coil embolization performed of the GDA. Embolization: Through the Renegade catheter, micro coil embolization performed of the proximal GDA by deploying 1 3 mm x 6 mm and 2 4 mm x 4 cm microcoils. Post embolization angiogram confirms complete stasis of the proximal GDA. No further active bleeding demonstrated. Micro catheter removed. C2 catheter retracted and utilized to select the SMA. SMA angiogram performed. SMA angiogram: SMA is widely patent. Jejunal and colic branches are patent. No active bleeding demonstrated from the inferior pancreaticoduodenal arcade. Right common femoral artery sheath injected. This confirms access within the common femoral artery. Hemostasis obtained with the Exoseal device. No immediate complication. Patient tolerated the procedure well. IMPRESSION: Active bleeding from the large duodenal ulcer eroding into the gastroduodenal artery. Successful micro coil embolization of the proximal GDA with 3 and 4 mm interlock micro coils as above. Electronically Signed   By: Jerilynn Mages.  Shick M.D.   On: 06/03/2015 14:27   [place] Guide Roadmapping  06/03/2015  INDICATION: GI bleeding duodenal ulcer confirmed by endoscopy EXAM: IR ULTRASOUND GUIDANCE VASC ACCESS RIGHT; SELECTIVE VISCERAL ARTERIOGRAPHY; ARTERIOGRAPHY; ADDITIONAL ARTERIOGRAPHY; IR EMBO ART VEN HEMORR LYMPH EXTRAV INC GUIDE ROADMAPPING MEDICATIONS: None. ANESTHESIA/SEDATION: Moderate (conscious) sedation was employed during this procedure. A total of Versed 0.5 mg and Fentanyl 25 mcg was administered intravenously. Moderate Sedation Time: 50 minutes. The patient's level of consciousness and vital signs were monitored continuously by  radiology nursing throughout the procedure under my direct supervision. CONTRAST:  130 cc Omnipaque 3 under FLUOROSCOPY TIME:  Fluoroscopy Time: 9 minutes 30 seconds (947 mGy). COMPLICATIONS: None immediate. PROCEDURE: Informed consent was obtained from the patient following explanation of the procedure, risks, benefits and alternatives. The patient understands, agrees and consents for the procedure. All questions were addressed. A time out was performed prior to the initiation of the procedure. Maximal barrier sterile technique utilized including caps, mask, sterile gowns, sterile gloves, large sterile drape, hand hygiene, and Betadine prep. Under sterile conditions and local anesthesia, ultrasound micropuncture access performed of the patent right common femoral artery. Five French sheath inserted. C2 catheter advanced into the aorta. Over a Glidewire, celiac origin was selected. Celiac angiogram: Celiac origin is widely patent. The left gastric, splenic and hepatic vasculature are all patent. Catheter was advanced over a Glidewire into the common hepatic artery. Common hepatic angiogram performed. Common hepatic angiogram: Common, proper, right and left hepatic arteries are patent. Origin of the GDA is visualized and appears partially occluded proximally. Renegade micro catheter advanced through the C2 catheter into the proximal GDA. GDA angiogram: Contrast injection demonstrates extravasation of contrast into the duodenum compatible with a bleeding duodenal ulcer by endoscopy. Micro catheter was retracted back into the irregular eroded appearing gastroduodenal artery. Coil embolization performed of the GDA. Embolization: Through the Renegade catheter, micro coil embolization performed of the proximal GDA by deploying 1 3 mm x 6 mm and 2 4 mm x 4 cm microcoils. Post embolization angiogram confirms complete stasis of the proximal GDA. No further active bleeding demonstrated. Micro catheter removed. C2 catheter  retracted and utilized to select the SMA. SMA angiogram performed. SMA angiogram: SMA is widely patent. Jejunal and colic branches are patent. No active bleeding demonstrated from the inferior pancreaticoduodenal arcade. Right common femoral artery sheath injected. This confirms access within the common femoral artery. Hemostasis obtained with the Exoseal device. No immediate complication. Patient tolerated the procedure well. IMPRESSION: Active bleeding from the large duodenal ulcer eroding into the gastroduodenal  artery. Successful micro coil embolization of the proximal GDA with 3 and 4 mm interlock micro coils as above. Electronically Signed   By: Jerilynn Mages.  Shick M.D.   On: 06/03/2015 14:27        Filed Weights   06/02/15 0525 06/03/15 0414 06/06/15 0500  Weight: 77.7 kg (171 lb 4.8 oz) 83 kg (182 lb 15.7 oz) 80.1 kg (176 lb 9.4 oz)     Microbiology: Recent Results (from the past 240 hour(s))  MRSA PCR Screening     Status: None   Collection Time: 06/02/15  8:05 AM  Result Value Ref Range Status   MRSA by PCR NEGATIVE NEGATIVE Final    Comment:        The GeneXpert MRSA Assay (FDA approved for NASAL specimens only), is one component of a comprehensive MRSA colonization surveillance program. It is not intended to diagnose MRSA infection nor to guide or monitor treatment for MRSA infections.        Blood Culture    Component Value Date/Time   SDES URINE, CLEAN CATCH 02/19/2015 1320   SPECREQUEST NONE 02/19/2015 1320   CULT  02/19/2015 1320    2,000 COLONIES/mL INSIGNIFICANT GROWTH Performed at [place] 02/21/2015 FINAL 02/19/2015 1320      Labs: Results for orders placed or performed during the hospital encounter of 06/02/15 (from the past 48 hour(s))  CBC     Status: Abnormal   Collection Time: 06/04/15  6:11 PM  Result Value Ref Range   WBC 5.7 4.0 - 10.5 K/uL   RBC 2.22 (L) 4.22 - 5.81 MIL/uL   Hemoglobin 6.7 (LL) 13.0 - 17.0 g/dL     Comment: REPEATED TO VERIFY CRITICAL RESULT CALLED TO, READ BACK BY AND VERIFIED WITH: R [facility] 06/04/15 1835 RHOLMES    HCT 20.0 (L) 39.0 - 52.0 %   MCV 90.1 78.0 - 100.0 fL   MCH 30.2 26.0 - 34.0 pg   MCHC 33.5 30.0 - 36.0 g/dL   RDW 15.9 (H) 11.5 - 15.5 %   Platelets 116 (L) 150 - 400 K/uL    Comment: REPEATED TO VERIFY PLATELET COUNT CONFIRMED BY SMEAR   CBC with Differential/Platelet     Status: Abnormal   Collection Time: 06/04/15  6:40 PM  Result Value Ref Range   WBC 7.8 4.0 - 10.5 K/uL   RBC 3.49 (L) 4.22 - 5.81 MIL/uL   Hemoglobin 10.3 (L) 13.0 - 17.0 g/dL    Comment: REPEATED TO VERIFY DELTA CHECK NOTED    HCT 31.2 (L) 39.0 - 52.0 %   MCV 89.4 78.0 - 100.0 fL   MCH 29.5 26.0 - 34.0 pg   MCHC 33.0 30.0 - 36.0 g/dL   RDW 15.8 (H) 11.5 - 15.5 %   Platelets 181 150 - 400 K/uL   Neutrophils Relative % 77 %   Neutro Abs 6.0 1.7 - 7.7 K/uL   Lymphocytes Relative 10 %   Lymphs Abs 0.8 0.7 - 4.0 K/uL   Monocytes Relative 11 %   Monocytes Absolute 0.8 0.1 - 1.0 K/uL   Eosinophils Relative 2 %   Eosinophils Absolute 0.2 0.0 - 0.7 K/uL   Basophils Relative 0 %   Basophils Absolute 0.0 0.0 - 0.1 K/uL  CBC     Status: Abnormal   Collection Time: 06/05/15  6:30 AM  Result Value Ref Range   WBC 6.0 4.0 - 10.5 K/uL   RBC 3.34 (L) 4.22 - 5.81 MIL/uL  Hemoglobin 10.2 (L) 13.0 - 17.0 g/dL   HCT 29.8 (L) 39.0 - 52.0 %   MCV 89.2 78.0 - 100.0 fL   MCH 30.5 26.0 - 34.0 pg   MCHC 34.2 30.0 - 36.0 g/dL   RDW 15.4 11.5 - 15.5 %   Platelets 184 150 - 400 K/uL  Comprehensive metabolic panel     Status: Abnormal   Collection Time: 06/05/15  6:30 AM  Result Value Ref Range   Sodium 138 135 - 145 mmol/L   Potassium 4.2 3.5 - 5.1 mmol/L   Chloride 110 101 - 111 mmol/L   CO2 24 22 - 32 mmol/L   Glucose, Bld 111 (H) 65 - 99 mg/dL   BUN 6 6 - 20 mg/dL   Creatinine, Ser 1.21 0.61 - 1.24 mg/dL   Calcium 8.3 (L) 8.9 - 10.3 mg/dL   Total Protein 4.9 (L) 6.5 - 8.1 g/dL   Albumin  2.2 (L) 3.5 - 5.0 g/dL   AST 18 15 - 41 U/L   ALT 12 (L) 17 - 63 U/L   Alkaline Phosphatase 95 38 - 126 U/L   Total Bilirubin 0.6 0.3 - 1.2 mg/dL   GFR calc non Af Amer 56 (L) >60 mL/min   GFR calc Af Amer >60 >60 mL/min    Comment: (NOTE) The eGFR has been calculated using the CKD EPI equation. This calculation has not been validated in all clinical situations. eGFR's persistently <60 mL/min signify possible Chronic Kidney Disease.    Anion gap 4 (L) 5 - 15  CBC     Status: Abnormal   Collection Time: 06/05/15 12:49 PM  Result Value Ref Range   WBC 6.8 4.0 - 10.5 K/uL   RBC 3.81 (L) 4.22 - 5.81 MIL/uL   Hemoglobin 11.6 (L) 13.0 - 17.0 g/dL   HCT 34.3 (L) 39.0 - 52.0 %   MCV 90.0 78.0 - 100.0 fL   MCH 30.4 26.0 - 34.0 pg   MCHC 33.8 30.0 - 36.0 g/dL   RDW 15.6 (H) 11.5 - 15.5 %   Platelets 194 150 - 400 K/uL  CBC     Status: Abnormal   Collection Time: 06/06/15  5:30 AM  Result Value Ref Range   WBC 5.2 4.0 - 10.5 K/uL   RBC 3.27 (L) 4.22 - 5.81 MIL/uL   Hemoglobin 9.9 (L) 13.0 - 17.0 g/dL   HCT 29.5 (L) 39.0 - 52.0 %   MCV 90.2 78.0 - 100.0 fL   MCH 30.3 26.0 - 34.0 pg   MCHC 33.6 30.0 - 36.0 g/dL   RDW 15.2 11.5 - 15.5 %   Platelets 174 150 - 400 K/uL     Lipid Panel     Component Value Date/Time   CHOL 253* 09/05/2014 1530   TRIG 330* 09/05/2014 1530   HDL 30* 09/05/2014 1530   CHOLHDL 8.4 09/05/2014 1530   VLDL 66* 09/05/2014 1530   LDLCALC 157* 09/05/2014 1530     No results found for: HGBA1C   Lab Results  Component Value Date   LDLCALC 157* 09/05/2014   CREATININE 1.21 06/05/2015     History of present illness 55 yom with past medical history of pancreatic cancer and colonoscopy 2015 that revealed mild sigmoid diverticulosis and internal hemorrhoids. Presented with complaints of bloody stools and LOC. [name] any injuries during LOC and reports passing dark marron colored stools and clots. Before arriving to [facility] he presented to [facility]  for abdominal pain where he was  cleared for release after unremarkable labs and CT scan. In the ED noted to be heme positive with Hgb 8.7. Hemoglobin dropped to 6.8. Patient was given 2 units of packed red blood cells and hemoglobin improved to 9.0. Patient transferred to [facility] stepdown unit. He developed SVT on 5/13  with hemodynamic instability  Assessment/Plan: GI bleed with acute blood loss anemia. Hemoglobin dropped from 9.0-7.7 on 5/13, received 2 more units, now stable around 10.2. Patient has received a total of 4 units this admission. Dr. Paulita Fujita from gastroenterology was consulted, patient was maintained on a Protonix drip .Dr. Oneida Alar. Saw the  patient at [facility], found large duodenal ulcer, she recommends transfer to [facility].   Dr. Paulita Fujita  recommended interventional radiology management if bleeding recurs. Interventional radiology Dr. Reesa Chew  was consulted. Patient underwent Successful coil embolization of gastroduodenal artery . Hemoglobin has been stable after the procedure and the patient has been hemodynamically stable. Patient needs to continue with twice a day PPI and follow-up with gastroenterology and oncology.  Acute blood loss anemia. Status post transfusion of 4 units of packed red blood cells.   Acute kidney injury superimposed on chronic kidney disease stage III. Improving with volume resuscitation   Syncope secondary to vasovagal episode. Follow up orthostatic BP. Troponin negative. Found to be in SVT 5/13 due to ongoing bleeding. Converted to normal sinus rhythm with IV Cardizem, fluid resuscitation, transfusion of packed red blood cells. The patient has been stable for 48 hours prior to discharge  SVT-converted to atrial fibrillation and subsequently sinus rhythm with IV Cardizem, no SVT since 5/13  Barretts esophagus, continue PPI.   Pancreatic carcinoma, no change noted in CT A/P with contrast 5/11. Recently seen by his oncologist at [facility] last month. Patient tolerated chemotherapy poorly and declined any further treatment. He had ERCP with metal stent placement back in February 2017 at [facility]. 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  Gout, stable.     Discharge Exam:    Blood pressure 129/82, pulse 95, temperature 98.3 F (36.8 C), temperature source Oral, resp. rate 24, height 5' 9"  (1.753 m), weight 80.1 kg (176 lb 9.4 oz), SpO2 98 %.  General: Alert, pleasant and cooperative in NAD Head: Normocephalic and atraumatic. Neck: Supple; Lungs: Clear throughout to auscultation.  Heart: Regular rate and rhythm. Abdomen: Soft, nontender and nondistended. Normal bowel sounds, without guarding, and without rebound.  Neurologic: Alert and oriented x4; grossly normal neurologically.    Follow-up Information    Follow up with [name], MD. Schedule an appointment as soon as possible for a visit in 3 days.   Specialty:  Family Medicine   Contact information:   [address] [place] [address] [phone]       Follow up with Landry Dyke, MD. Schedule an appointment as soon as possible for a visit in 1 week.   Specialty:  Gastroenterology   Contact information:   [address] [place] [address] [phone]       Signed: Reyne Dumas 06/06/2015, 9:19 AM        Time spent >45 mins

## 2015-06-06 NOTE — Progress Notes (Addendum)
Discharge instructions reviewed with patient and wife, Enid Derry, who is at bedside. Prescription given to patient. Other prescriptions sent to pharmacy and patient and wife made aware. Correct pharmacy information displayed. Appointment information reviewed. It did have him seeing 2 diff GI doctors for follow up but per GI here they want him to follow up with his regular GI Dr. Oneida Alar, this was explained to patient and wife both verbalize understanding of instructions. S/s of infection reviewed regarding site incision where procedure occurred. Patient education printed about embolization procedure for patient knowledge. PIV removed. VSS. Pt in no acute distress. Ambulatory. Volunteer services called and patient was discharged via wheelchair.

## 2015-06-13 DIAGNOSIS — Z1389 Encounter for screening for other disorder: Secondary | ICD-10-CM | POA: Diagnosis not present

## 2015-06-13 DIAGNOSIS — Z6824 Body mass index (BMI) 24.0-24.9, adult: Secondary | ICD-10-CM | POA: Diagnosis not present

## 2015-06-13 DIAGNOSIS — K219 Gastro-esophageal reflux disease without esophagitis: Secondary | ICD-10-CM | POA: Diagnosis not present

## 2015-06-13 DIAGNOSIS — K9289 Other specified diseases of the digestive system: Secondary | ICD-10-CM | POA: Diagnosis not present

## 2015-06-14 ENCOUNTER — Encounter: Payer: Self-pay | Admitting: Gastroenterology

## 2015-06-14 ENCOUNTER — Ambulatory Visit (INDEPENDENT_AMBULATORY_CARE_PROVIDER_SITE_OTHER): Payer: Medicare Other | Admitting: Gastroenterology

## 2015-06-14 VITALS — BP 103/69 | HR 97 | Temp 96.9°F | Ht 69.0 in | Wt 163.2 lb

## 2015-06-14 DIAGNOSIS — C259 Malignant neoplasm of pancreas, unspecified: Secondary | ICD-10-CM | POA: Diagnosis not present

## 2015-06-14 DIAGNOSIS — K264 Chronic or unspecified duodenal ulcer with hemorrhage: Secondary | ICD-10-CM | POA: Diagnosis not present

## 2015-06-14 MED ORDER — OXYCODONE HCL 10 MG PO TABS: 10.0000 mg | ORAL_TABLET | Freq: Four times a day (QID) | ORAL | Status: DC | PRN

## 2015-06-14 NOTE — Progress Notes (Signed)
Primary Care Physician: [name], MD  Primary Gastroenterologist:  Barney Drain, MD   Chief Complaint  Patient presents with  . Follow-up    HPI: PERL William Rivera is a 80 y.o. male here For follow-up. H/O stage IIA pancreatic adenocarcinoma that was previously borderline resectable, but biopsy confirmed metastatic lung nodules. Recently seen by his oncologist at [facility] last month. Patient tolerated chemotherapy poorly and declined any further treatment. He had ERCP with metal stent placement back in February 2017 at [facility]. 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   Underwent EGD for melena and hematochezia back on 06/02/2015 during hospitalization. He had mild gastritis. GI bleed due to large duodenal ulcer with large visible vessel, high risk for rebleeding. No intervention done. He was transferred to [facility]. He underwent embolization by interventional radiology. At time of discharge, hgb 10.3.  Patient denies further bleeding. No melena or brbpr. Continues to have postprandial upper abdominal pain in setting of pancreatic cancer. No vomiting. Tolerating soft foods. Ask for refill on pain medication. Sees oncologist next month. Has been taking oxycodone 10mg  2-3 times per day. miralax daily, BM every 2-3 days.     Current Outpatient Prescriptions  Medication Sig Dispense Refill  . Artificial Tear Ointment (DRY EYES OP) Apply 1-2 drops to eye daily as needed (dry eyes).    . CREON 24000 units CPEP Take 2 capsules by mouth 4 (four) times daily. Take 2 capsules with meals and 1 capsule with snacks.  1  . feeding supplement (BOOST / RESOURCE BREEZE) LIQD Take 1 Container by mouth 3 (three) times daily with meals. 90 Container 0  . ferrous sulfate 300 (60 Fe) MG/5ML syrup Take 5 mLs (300 mg total) by mouth daily. 150 mL 3  . ondansetron (ZOFRAN) 8 MG tablet Take 8 mg by mouth every 8 (eight) hours as needed for nausea or vomiting.       . pantoprazole (PROTONIX) 40 MG tablet Take 1 tablet (40 mg total) by mouth 2 (two) times daily. 80 tablet 1  . simethicone (MYLICON) 0000000 MG chewable tablet Chew 125 mg by mouth every 6 (six) hours as needed for flatulence.     No current facility-administered medications for this visit.    Allergies as of 06/14/2015 - Review Complete 06/14/2015  Allergen Reaction Noted  . Doxycycline Itching 03/21/2015  . Colestipol Itching 09/14/2014  . Doxycycline Rash 09/05/2014    ROS:  General: Negative for, fever, chills, fatigue, weakness.see hpi ENT: Negative for hoarseness, difficulty swallowing , nasal congestion. CV: Negative for chest pain, angina, palpitations, dyspnea on exertion, peripheral edema.  Respiratory: Negative for dyspnea at rest, dyspnea on exertion, cough, sputum, wheezing.  GI: See history of present illness. GU:  Negative for dysuria, hematuria, urinary incontinence, urinary frequency, nocturnal urination.  Endo: see hpi   Physical Examination:   BP 103/69 mmHg  Pulse 97  Temp(Src) 96.9 F (36.1 C)  Ht 5\' 9"  (1.753 m)  Wt 163 lb 3.2 oz (74.027 kg)  BMI 24.09 kg/m2  General: Well-nourished, well-developed in no acute distress.  Eyes: No icterus. Mouth: Oropharyngeal mucosa moist and pink , no lesions erythema or exudate. Lungs: Clear to auscultation bilaterally.  Heart: Regular rate and rhythm, no murmurs rubs or gallops.  Abdomen: Bowel sounds are normal, moderate epig tenderness, nondistended, no hepatosplenomegaly or masses, no abdominal bruits or hernia , no rebound or guarding.   Extremities: No lower extremity edema. No clubbing  or deformities. Neuro: Alert and oriented x 4   Skin: Warm and dry, no jaundice.   Psych: Alert and cooperative, normal mood and affect.  Labs:  Lab Results  Component Value Date   WBC 5.2 06/06/2015   HGB 10.3* 06/06/2015   HCT 29.8* 06/06/2015   MCV 90.3 06/06/2015   PLT 162 06/06/2015   Lab Results  Component Value  Date   ALT 12* 06/05/2015   AST 18 06/05/2015   ALKPHOS 95 06/05/2015   BILITOT 0.6 06/05/2015   Lab Results  Component Value Date   LIPASE 11 06/02/2015   Lab Results  Component Value Date   CREATININE 1.21 06/05/2015   BUN 6 06/05/2015   NA 138 06/05/2015   K 4.2 06/05/2015   CL 110 06/05/2015   CO2 24 06/05/2015    Imaging Studies: Ir Angiogram Visceral Selective  06/03/2015  INDICATION: GI bleeding duodenal ulcer confirmed by endoscopy EXAM: IR ULTRASOUND GUIDANCE VASC ACCESS RIGHT; SELECTIVE VISCERAL ARTERIOGRAPHY; ARTERIOGRAPHY; ADDITIONAL ARTERIOGRAPHY; IR EMBO ART VEN HEMORR LYMPH EXTRAV INC GUIDE ROADMAPPING MEDICATIONS: None. ANESTHESIA/SEDATION: Moderate (conscious) sedation was employed during this procedure. A total of Versed 0.5 mg and Fentanyl 25 mcg was administered intravenously. Moderate Sedation Time: 50 minutes. The patient's level of consciousness and vital signs were monitored continuously by radiology nursing throughout the procedure under my direct supervision. CONTRAST:  130 cc Omnipaque 3 under FLUOROSCOPY TIME:  Fluoroscopy Time: 9 minutes 30 seconds (947 mGy). COMPLICATIONS: None immediate. PROCEDURE: Informed consent was obtained from the patient following explanation of the procedure, risks, benefits and alternatives. The patient understands, agrees and consents for the procedure. All questions were addressed. A time out was performed prior to the initiation of the procedure. Maximal barrier sterile technique utilized including caps, mask, sterile gowns, sterile gloves, large sterile drape, hand hygiene, and Betadine prep. Under sterile conditions and local anesthesia, ultrasound micropuncture access performed of the patent right common femoral artery. Five French sheath inserted. C2 catheter advanced into the aorta. Over a Glidewire, celiac origin was selected. Celiac angiogram: Celiac origin is widely patent. The left gastric, splenic and hepatic vasculature are  all patent. Catheter was advanced over a Glidewire into the common hepatic artery. Common hepatic angiogram performed. Common hepatic angiogram: Common, proper, right and left hepatic arteries are patent. Origin of the GDA is visualized and appears partially occluded proximally. Renegade micro catheter advanced through the C2 catheter into the proximal GDA. GDA angiogram: Contrast injection demonstrates extravasation of contrast into the duodenum compatible with a bleeding duodenal ulcer by endoscopy. Micro catheter was retracted back into the irregular eroded appearing gastroduodenal artery. Coil embolization performed of the GDA. Embolization: Through the Renegade catheter, micro coil embolization performed of the proximal GDA by deploying 1 3 mm x 6 mm and 2 4 mm x 4 cm microcoils. Post embolization angiogram confirms complete stasis of the proximal GDA. No further active bleeding demonstrated. Micro catheter removed. C2 catheter retracted and utilized to select the SMA. SMA angiogram performed. SMA angiogram: SMA is widely patent. Jejunal and colic branches are patent. No active bleeding demonstrated from the inferior pancreaticoduodenal arcade. Right common femoral artery sheath injected. This confirms access within the common femoral artery. Hemostasis obtained with the Exoseal device. No immediate complication. Patient tolerated the procedure well. IMPRESSION: Active bleeding from the large duodenal ulcer eroding into the gastroduodenal artery. Successful micro coil embolization of the proximal GDA with 3 and 4 mm interlock micro coils as above. Electronically Signed   By: Jerilynn Mages.  Shick M.D.   On: 06/03/2015 14:27   Ir Angiogram Visceral Selective  06/03/2015  INDICATION: GI bleeding duodenal ulcer confirmed by endoscopy EXAM: IR ULTRASOUND GUIDANCE VASC ACCESS RIGHT; SELECTIVE VISCERAL ARTERIOGRAPHY; ARTERIOGRAPHY; ADDITIONAL ARTERIOGRAPHY; IR EMBO ART VEN HEMORR LYMPH EXTRAV INC GUIDE ROADMAPPING  MEDICATIONS: None. ANESTHESIA/SEDATION: Moderate (conscious) sedation was employed during this procedure. A total of Versed 0.5 mg and Fentanyl 25 mcg was administered intravenously. Moderate Sedation Time: 50 minutes. The patient's level of consciousness and vital signs were monitored continuously by radiology nursing throughout the procedure under my direct supervision. CONTRAST:  130 cc Omnipaque 3 under FLUOROSCOPY TIME:  Fluoroscopy Time: 9 minutes 30 seconds (947 mGy). COMPLICATIONS: None immediate. PROCEDURE: Informed consent was obtained from the patient following explanation of the procedure, risks, benefits and alternatives. The patient understands, agrees and consents for the procedure. All questions were addressed. A time out was performed prior to the initiation of the procedure. Maximal barrier sterile technique utilized including caps, mask, sterile gowns, sterile gloves, large sterile drape, hand hygiene, and Betadine prep. Under sterile conditions and local anesthesia, ultrasound micropuncture access performed of the patent right common femoral artery. Five French sheath inserted. C2 catheter advanced into the aorta. Over a Glidewire, celiac origin was selected. Celiac angiogram: Celiac origin is widely patent. The left gastric, splenic and hepatic vasculature are all patent. Catheter was advanced over a Glidewire into the common hepatic artery. Common hepatic angiogram performed. Common hepatic angiogram: Common, proper, right and left hepatic arteries are patent. Origin of the GDA is visualized and appears partially occluded proximally. Renegade micro catheter advanced through the C2 catheter into the proximal GDA. GDA angiogram: Contrast injection demonstrates extravasation of contrast into the duodenum compatible with a bleeding duodenal ulcer by endoscopy. Micro catheter was retracted back into the irregular eroded appearing gastroduodenal artery. Coil embolization performed of the GDA.  Embolization: Through the Renegade catheter, micro coil embolization performed of the proximal GDA by deploying 1 3 mm x 6 mm and 2 4 mm x 4 cm microcoils. Post embolization angiogram confirms complete stasis of the proximal GDA. No further active bleeding demonstrated. Micro catheter removed. C2 catheter retracted and utilized to select the SMA. SMA angiogram performed. SMA angiogram: SMA is widely patent. Jejunal and colic branches are patent. No active bleeding demonstrated from the inferior pancreaticoduodenal arcade. Right common femoral artery sheath injected. This confirms access within the common femoral artery. Hemostasis obtained with the Exoseal device. No immediate complication. Patient tolerated the procedure well. IMPRESSION: Active bleeding from the large duodenal ulcer eroding into the gastroduodenal artery. Successful micro coil embolization of the proximal GDA with 3 and 4 mm interlock micro coils as above. Electronically Signed   By: Jerilynn Mages.  Shick M.D.   On: 06/03/2015 14:27   Ir Angiogram Selective Each Additional Vessel  06/03/2015  INDICATION: GI bleeding duodenal ulcer confirmed by endoscopy EXAM: IR ULTRASOUND GUIDANCE VASC ACCESS RIGHT; SELECTIVE VISCERAL ARTERIOGRAPHY; ARTERIOGRAPHY; ADDITIONAL ARTERIOGRAPHY; IR EMBO ART VEN HEMORR LYMPH EXTRAV INC GUIDE ROADMAPPING MEDICATIONS: None. ANESTHESIA/SEDATION: Moderate (conscious) sedation was employed during this procedure. A total of Versed 0.5 mg and Fentanyl 25 mcg was administered intravenously. Moderate Sedation Time: 50 minutes. The patient's level of consciousness and vital signs were monitored continuously by radiology nursing throughout the procedure under my direct supervision. CONTRAST:  130 cc Omnipaque 3 under FLUOROSCOPY TIME:  Fluoroscopy Time: 9 minutes 30 seconds (947 mGy). COMPLICATIONS: None immediate. PROCEDURE: Informed consent was obtained from the patient following explanation of the procedure, risks,  benefits and  alternatives. The patient understands, agrees and consents for the procedure. All questions were addressed. A time out was performed prior to the initiation of the procedure. Maximal barrier sterile technique utilized including caps, mask, sterile gowns, sterile gloves, large sterile drape, hand hygiene, and Betadine prep. Under sterile conditions and local anesthesia, ultrasound micropuncture access performed of the patent right common femoral artery. Five French sheath inserted. C2 catheter advanced into the aorta. Over a Glidewire, celiac origin was selected. Celiac angiogram: Celiac origin is widely patent. The left gastric, splenic and hepatic vasculature are all patent. Catheter was advanced over a Glidewire into the common hepatic artery. Common hepatic angiogram performed. Common hepatic angiogram: Common, proper, right and left hepatic arteries are patent. Origin of the GDA is visualized and appears partially occluded proximally. Renegade micro catheter advanced through the C2 catheter into the proximal GDA. GDA angiogram: Contrast injection demonstrates extravasation of contrast into the duodenum compatible with a bleeding duodenal ulcer by endoscopy. Micro catheter was retracted back into the irregular eroded appearing gastroduodenal artery. Coil embolization performed of the GDA. Embolization: Through the Renegade catheter, micro coil embolization performed of the proximal GDA by deploying 1 3 mm x 6 mm and 2 4 mm x 4 cm microcoils. Post embolization angiogram confirms complete stasis of the proximal GDA. No further active bleeding demonstrated. Micro catheter removed. C2 catheter retracted and utilized to select the SMA. SMA angiogram performed. SMA angiogram: SMA is widely patent. Jejunal and colic branches are patent. No active bleeding demonstrated from the inferior pancreaticoduodenal arcade. Right common femoral artery sheath injected. This confirms access within the common femoral artery.  Hemostasis obtained with the Exoseal device. No immediate complication. Patient tolerated the procedure well. IMPRESSION: Active bleeding from the large duodenal ulcer eroding into the gastroduodenal artery. Successful micro coil embolization of the proximal GDA with 3 and 4 mm interlock micro coils as above. Electronically Signed   By: Jerilynn Mages.  Shick M.D.   On: 06/03/2015 14:27   Ir Angiogram Follow Up Study  06/03/2015  INDICATION: GI bleeding duodenal ulcer confirmed by endoscopy EXAM: IR ULTRASOUND GUIDANCE VASC ACCESS RIGHT; SELECTIVE VISCERAL ARTERIOGRAPHY; ARTERIOGRAPHY; ADDITIONAL ARTERIOGRAPHY; IR EMBO ART VEN HEMORR LYMPH EXTRAV INC GUIDE ROADMAPPING MEDICATIONS: None. ANESTHESIA/SEDATION: Moderate (conscious) sedation was employed during this procedure. A total of Versed 0.5 mg and Fentanyl 25 mcg was administered intravenously. Moderate Sedation Time: 50 minutes. The patient's level of consciousness and vital signs were monitored continuously by radiology nursing throughout the procedure under my direct supervision. CONTRAST:  130 cc Omnipaque 3 under FLUOROSCOPY TIME:  Fluoroscopy Time: 9 minutes 30 seconds (947 mGy). COMPLICATIONS: None immediate. PROCEDURE: Informed consent was obtained from the patient following explanation of the procedure, risks, benefits and alternatives. The patient understands, agrees and consents for the procedure. All questions were addressed. A time out was performed prior to the initiation of the procedure. Maximal barrier sterile technique utilized including caps, mask, sterile gowns, sterile gloves, large sterile drape, hand hygiene, and Betadine prep. Under sterile conditions and local anesthesia, ultrasound micropuncture access performed of the patent right common femoral artery. Five French sheath inserted. C2 catheter advanced into the aorta. Over a Glidewire, celiac origin was selected. Celiac angiogram: Celiac origin is widely patent. The left gastric, splenic and  hepatic vasculature are all patent. Catheter was advanced over a Glidewire into the common hepatic artery. Common hepatic angiogram performed. Common hepatic angiogram: Common, proper, right and left hepatic arteries are patent. Origin of the GDA is visualized  and appears partially occluded proximally. Renegade micro catheter advanced through the C2 catheter into the proximal GDA. GDA angiogram: Contrast injection demonstrates extravasation of contrast into the duodenum compatible with a bleeding duodenal ulcer by endoscopy. Micro catheter was retracted back into the irregular eroded appearing gastroduodenal artery. Coil embolization performed of the GDA. Embolization: Through the Renegade catheter, micro coil embolization performed of the proximal GDA by deploying 1 3 mm x 6 mm and 2 4 mm x 4 cm microcoils. Post embolization angiogram confirms complete stasis of the proximal GDA. No further active bleeding demonstrated. Micro catheter removed. C2 catheter retracted and utilized to select the SMA. SMA angiogram performed. SMA angiogram: SMA is widely patent. Jejunal and colic branches are patent. No active bleeding demonstrated from the inferior pancreaticoduodenal arcade. Right common femoral artery sheath injected. This confirms access within the common femoral artery. Hemostasis obtained with the Exoseal device. No immediate complication. Patient tolerated the procedure well. IMPRESSION: Active bleeding from the large duodenal ulcer eroding into the gastroduodenal artery. Successful micro coil embolization of the proximal GDA with 3 and 4 mm interlock micro coils as above. Electronically Signed   By: Jerilynn Mages.  Shick M.D.   On: 06/03/2015 14:27   Ir US Guide Vasc Access Right  06/03/2015  INDICATION: GI bleeding duodenal ulcer confirmed by endoscopy EXAM: IR ULTRASOUND GUIDANCE VASC ACCESS RIGHT; SELECTIVE VISCERAL ARTERIOGRAPHY; ARTERIOGRAPHY; ADDITIONAL ARTERIOGRAPHY; IR EMBO ART VEN HEMORR LYMPH EXTRAV INC GUIDE  ROADMAPPING MEDICATIONS: None. ANESTHESIA/SEDATION: Moderate (conscious) sedation was employed during this procedure. A total of Versed 0.5 mg and Fentanyl 25 mcg was administered intravenously. Moderate Sedation Time: 50 minutes. The patient's level of consciousness and vital signs were monitored continuously by radiology nursing throughout the procedure under my direct supervision. CONTRAST:  130 cc Omnipaque 3 under FLUOROSCOPY TIME:  Fluoroscopy Time: 9 minutes 30 seconds (947 mGy). COMPLICATIONS: None immediate. PROCEDURE: Informed consent was obtained from the patient following explanation of the procedure, risks, benefits and alternatives. The patient understands, agrees and consents for the procedure. All questions were addressed. A time out was performed prior to the initiation of the procedure. Maximal barrier sterile technique utilized including caps, mask, sterile gowns, sterile gloves, large sterile drape, hand hygiene, and Betadine prep. Under sterile conditions and local anesthesia, ultrasound micropuncture access performed of the patent right common femoral artery. Five French sheath inserted. C2 catheter advanced into the aorta. Over a Glidewire, celiac origin was selected. Celiac angiogram: Celiac origin is widely patent. The left gastric, splenic and hepatic vasculature are all patent. Catheter was advanced over a Glidewire into the common hepatic artery. Common hepatic angiogram performed. Common hepatic angiogram: Common, proper, right and left hepatic arteries are patent. Origin of the GDA is visualized and appears partially occluded proximally. Renegade micro catheter advanced through the C2 catheter into the proximal GDA. GDA angiogram: Contrast injection demonstrates extravasation of contrast into the duodenum compatible with a bleeding duodenal ulcer by endoscopy. Micro catheter was retracted back into the irregular eroded appearing gastroduodenal artery. Coil embolization performed of the  GDA. Embolization: Through the Renegade catheter, micro coil embolization performed of the proximal GDA by deploying 1 3 mm x 6 mm and 2 4 mm x 4 cm microcoils. Post embolization angiogram confirms complete stasis of the proximal GDA. No further active bleeding demonstrated. Micro catheter removed. C2 catheter retracted and utilized to select the SMA. SMA angiogram performed. SMA angiogram: SMA is widely patent. Jejunal and colic branches are patent. No active bleeding demonstrated from the inferior  pancreaticoduodenal arcade. Right common femoral artery sheath injected. This confirms access within the common femoral artery. Hemostasis obtained with the Exoseal device. No immediate complication. Patient tolerated the procedure well. IMPRESSION: Active bleeding from the large duodenal ulcer eroding into the gastroduodenal artery. Successful micro coil embolization of the proximal GDA with 3 and 4 mm interlock micro coils as above. Electronically Signed   By: Jerilynn Mages.  Shick M.D.   On: 06/03/2015 14:27   [place] Guide Roadmapping  06/03/2015  INDICATION: GI bleeding duodenal ulcer confirmed by endoscopy EXAM: IR ULTRASOUND GUIDANCE VASC ACCESS RIGHT; SELECTIVE VISCERAL ARTERIOGRAPHY; ARTERIOGRAPHY; ADDITIONAL ARTERIOGRAPHY; IR EMBO ART VEN HEMORR LYMPH EXTRAV INC GUIDE ROADMAPPING MEDICATIONS: None. ANESTHESIA/SEDATION: Moderate (conscious) sedation was employed during this procedure. A total of Versed 0.5 mg and Fentanyl 25 mcg was administered intravenously. Moderate Sedation Time: 50 minutes. The patient's level of consciousness and vital signs were monitored continuously by radiology nursing throughout the procedure under my direct supervision. CONTRAST:  130 cc Omnipaque 3 under FLUOROSCOPY TIME:  Fluoroscopy Time: 9 minutes 30 seconds (947 mGy). COMPLICATIONS: None immediate. PROCEDURE: Informed consent was obtained from the patient following explanation of the procedure, risks,  benefits and alternatives. The patient understands, agrees and consents for the procedure. All questions were addressed. A time out was performed prior to the initiation of the procedure. Maximal barrier sterile technique utilized including caps, mask, sterile gowns, sterile gloves, large sterile drape, hand hygiene, and Betadine prep. Under sterile conditions and local anesthesia, ultrasound micropuncture access performed of the patent right common femoral artery. Five French sheath inserted. C2 catheter advanced into the aorta. Over a Glidewire, celiac origin was selected. Celiac angiogram: Celiac origin is widely patent. The left gastric, splenic and hepatic vasculature are all patent. Catheter was advanced over a Glidewire into the common hepatic artery. Common hepatic angiogram performed. Common hepatic angiogram: Common, proper, right and left hepatic arteries are patent. Origin of the GDA is visualized and appears partially occluded proximally. Renegade micro catheter advanced through the C2 catheter into the proximal GDA. GDA angiogram: Contrast injection demonstrates extravasation of contrast into the duodenum compatible with a bleeding duodenal ulcer by endoscopy. Micro catheter was retracted back into the irregular eroded appearing gastroduodenal artery. Coil embolization performed of the GDA. Embolization: Through the Renegade catheter, micro coil embolization performed of the proximal GDA by deploying 1 3 mm x 6 mm and 2 4 mm x 4 cm microcoils. Post embolization angiogram confirms complete stasis of the proximal GDA. No further active bleeding demonstrated. Micro catheter removed. C2 catheter retracted and utilized to select the SMA. SMA angiogram performed. SMA angiogram: SMA is widely patent. Jejunal and colic branches are patent. No active bleeding demonstrated from the inferior pancreaticoduodenal arcade. Right common femoral artery sheath injected. This confirms access within the common femoral  artery. Hemostasis obtained with the Exoseal device. No immediate complication. Patient tolerated the procedure well. IMPRESSION: Active bleeding from the large duodenal ulcer eroding into the gastroduodenal artery. Successful micro coil embolization of the proximal GDA with 3 and 4 mm interlock micro coils as above. Electronically Signed   By: Jerilynn Mages.  Shick M.D.   On: 06/03/2015 14:27

## 2015-06-14 NOTE — Assessment & Plan Note (Signed)
80 y/o male with pancreatic adenocarcinoma with metal biliary stent (unresectable), no longer on chemo due to intolerance with recent UGI bleed secondary to large duodenal ulcer with large visible vessel. S/p embolization. Hgb 10.3 at discharge. No overt GI bleeding. Continues to have postprandial pain, likely mostly due to pancreatic carcinoma. Provided one month supply of oxycodone to hold him until next OV with oncologist.   Patient to call if further GI bleeding or go to nearest ER. Keep appointment with oncology and get future refills for pain medication from them or PCP. I don't suspect EGD in 2-3 months will not be recommended but will discuss with Dr. Oneida Alar.

## 2015-06-14 NOTE — Patient Instructions (Signed)
1. I will discuss further management of duodenal ulcer with Dr. Oneida Alar, with regards to if additional upper endoscopy is needed to verify healing, etc. 2. Pain medication RX provided today. Please ask oncology or PCP for future refills.  3. Please call or go to nearest ER if you have recurrent bleeding.

## 2015-06-15 NOTE — Progress Notes (Signed)
cc'ed to pcp °

## 2015-06-21 ENCOUNTER — Encounter (HOSPITAL_COMMUNITY): Payer: Self-pay | Admitting: Occupational Therapy

## 2015-06-21 ENCOUNTER — Ambulatory Visit (HOSPITAL_COMMUNITY): Payer: Medicare Other | Attending: Internal Medicine | Admitting: Occupational Therapy

## 2015-06-21 DIAGNOSIS — R531 Weakness: Secondary | ICD-10-CM | POA: Diagnosis not present

## 2015-06-21 DIAGNOSIS — N183 Chronic kidney disease, stage 3 (moderate): Secondary | ICD-10-CM | POA: Diagnosis not present

## 2015-06-21 DIAGNOSIS — R262 Difficulty in walking, not elsewhere classified: Secondary | ICD-10-CM | POA: Diagnosis not present

## 2015-06-21 DIAGNOSIS — M6281 Muscle weakness (generalized): Secondary | ICD-10-CM

## 2015-06-21 NOTE — Therapy (Addendum)
[place], [place], [address] Phone: [phone]   Fax:  [phone]  Occupational Therapy Wheelchair Evaluation  Patient Details  Name: William Rivera MRN: [id number] Date of Birth: 1935-11-11 No Data Recorded  Encounter Date: 06/21/2015      OT End of Session - 06/21/15 1538    Visit Number 1   Number of Visits 1   Date for OT Re-Evaluation 06/22/15   Authorization Type Medicare A & B   Authorization Time Period Before 10th visit   Authorization - Visit Number 1   Authorization - Number of Visits 10   OT Start Time 1429   OT Stop Time 1454   OT Time Calculation (min) 25 min   Activity Tolerance Patient tolerated treatment well   Behavior During Therapy [facility] for tasks assessed/performed      Past Medical History  Diagnosis Date  . Hypertension   . Gout   . Barrett's esophagus     last EGD/Bx 12/11  . Helicobacter pylori gastritis 2000    s/p treatment  . Adenomatous colon polyp 2010  . GERD (gastroesophageal reflux disease)   . GASTRITIS 09/23/2008    Qualifier: Diagnosis of  By: Nicole Kindred LPN, Doris    . Cancer of pancreas, other site AUG 2016    CA 19-9 958.6  . Pancreatic cancer ([place])   . Pancreatic cancer [facility])     Past Surgical History  Procedure Laterality Date  . Esophagogastroduodenoscopy  2008    DUODENITIS & GASTRITIS 2o TO NSAIDS/ETOH  . Colonoscopy  2010    simple adenomas  . Neck surgery      DISC REPLACED  . Bladder surgery  [id number]  . Sinus exploration  2000  . Tonsillectomy      AS A CHILD  . Esophagogastroduodenoscopy  12/2009    short segment Barrett's, small hh, chronic gastritis  . Nasal septum surgery    . Esophagogastroduodenoscopy  01/04/2011    [id number] gastritis/Barrett's, possible  . Esophagogastroduodenoscopy N/A 11/30/2012    Dr. Oneida Alar- normal esophagus, empiric dilation d/t c/o dysphagia/?cervical web, stomach= mild non erosive gastritis, inflammation on bx,  duodenum= no abnormalities in the bulb and second portion of the duodenum. dilation at the gastroesphageal junction.  Azzie Almas dilation N/A 11/30/2012    Procedure: SAVORY DILATION;  Surgeon: Danie Binder, MD;  Location[facility];  Service: Endoscopy;  Laterality: N/A;  Venia Minks dilation N/A 11/30/2012    Procedure: Venia Minks DILATION;  Surgeon: Danie Binder, MD;  Location: [facility];  Service: Endoscopy;  Laterality: N/A;  . 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 Colonoscopy N/A 07/28/2013    Dr. Oneida Alar: tubular adenoma, mild sigmoid colon diverticulosis, internal hemorhhoids   . Doppler echocardiography  2009  . Nm myoview ltd  2009  . Ercp N/A 09/14/2014    Dr. Laural Golden: sphincterotomy with 20 F plastic biliary stent placement   . Biliary stent placement N/A 09/14/2014    Procedure: BILIARY STENT PLACEMENT;  Surgeon: Rogene Houston, MD;  Location: [facility] ORS;  Service: Endoscopy;  Laterality: N/A;  . Sphincterotomy N/A 09/14/2014    Procedure: SPHINCTEROTOMY;  Surgeon: Rogene Houston, MD;  Location: [facility] ORS;  Service: Endoscopy;  Laterality: N/A;  . Esophagogastroduodenoscopy (egd) with propofol N/A 01/04/2015    RMR: partial uoddenal obstruction at junction of 1st and 2nd portion of duodenum, unale to intubate 2nd portion of duodenum to perform ERCP  . Ercp N/A 01/04/2015  Procedure: ATTEMPTED ENDOSCOPIC RETROGRADE CHOLANGIOPANCREATOGRAPHY (ERCP);  Surgeon: Daneil Dolin, MD;  Location: [facility] ORS;  Service: Endoscopy;  Laterality: N/A;  . Stents in bile ducts    . Hemorrhoid surgery    . Ercp  02/2015    [facility]: biliary stent exchange from plastic to metal stent  . Esophagogastroduodenoscopy N/A 06/02/2015    SLF: 1. normal esophagus 2. gastrtitis mild 3. GI bleed due to large duodenal ulcer with large visible vessel, high risk to rebleed without prevention     There were no vitals filed for this visit.    Date: 06/21/2015 Patient Name: William Rivera Address: [address],  [place], [place] [address] DOB: [birth year]  To whom it may concern,   William Rivera was seen today in this clinic for a wheelchair evaluation for an electric scooter. William Rivera has a medical history significant for stage III chronic kidney disease, pancreatic cancer including metastasized to right lung (non-operable), right TKA, prior neck operations in 1994 and 2002, as well as multiple GI bleeds. [name] entered clinic using a single point cane and ambulated approximately 50 feet from waiting area to ADL room with modified independence.   William Rivera lives with his wife and is independent in most ADL and IADL tasks. William Rivera does require assistance getting out of bed in the morning due to pain and stiffness. Occasionally hand-held assistance is needed to get to and from the bathroom or living room due to pain.   William Rivera currently owns a single point cane, which he uses for ambulation, as well as a manual wheelchair previously owned by a family member. The manual wheelchair is missing a rim which he has inquired about repairing in previous years, however has not done so.   A FULL PHYSICAL ASSESSMENT REVEALS THE FOLLOWING   Existing Equipment: [name] has a single point cane and manual wheelchair.     Transfers: William Rivera is ambulatory and does not require assistance with transfers, with   exception of the occasional morning when he requires hand-held assistance due to pain.   Head and Neck: A/ROM WFL     Trunk: A/ROM WFL   Pelvis: A/ROM WFL     Hip: A/ROM WFL   Knees: A/ROM WFL   Feet and Ankles: A/ROM WFL    Upper Extremities: BUE A/ROM WFL, strength 5/5   Lower Extremitites: BLE: Hip & knee strength- 4/5, ankle strength 5/5   Weight Shifting Ability: WNL    Skin Integrity: WNL; No issues with skin integrity     Cognition: WNL    Activity Tolerance: William Rivera reports he is able to stand for no more than approximately   30 minutes at a time (static). William Rivera is able to walk approximately  150-200 feet before   requiring an extended rest break.   GOALS/OBJECTIVE OF SEATING INTERVENTION:    Function: William Rivera is currently independent in ADL, functional mobility, and some IADL tasks using a single point cane both in the home and in the community. Mr. Delpino primary concern is falling or passing out while out in the community due to current medical issues. Discussed with Mr. Kalisch his current community mobility strategies including the use of store provided electric scooters, as well as the possibility of repairing his manual wheelchair or purchasing a scooter second-hand.  In the event of further medical decline due to medical issues including gastrointestinal bleeds and advanced pancreatic cancer, Mr. Merkin would benefit from an electric scooter for  home and community mobility to maintain independence during ADL tasks and decrease caregiver burden.    If you require any further information concerning Mr. Azriel Mateen positioning, independence or mobility needs; or any further information why a lesser device will not work, please do not hesitate to contact me at [place], [address] [place], [place] [address] [phone].    Visit Diagnosis: Muscle Weakness (generalized)      G-Codes - July 14, 2015 1540    Functional Assessment Tool Used Clinical Judgement   Functional Limitation Self care   Self Care Goal Status [id number]) At least 1 percent but less than 20 percent impaired, limited or restricted   Self Care Discharge Status [phone]) At least 1 percent but less than 20 percent impaired, limited or restricted      Problem List Patient Active Problem List   Diagnosis Date Noted  . Palliative care encounter   . Duodenal ulcer with hemorrhage   . Protein-calorie malnutrition, severe 06/03/2015  . GI bleeding 06/02/2015  . Acute blood loss anemia 06/02/2015  . AKI (acute kidney injury) ([place]) 06/02/2015  . Syncope 06/02/2015  . Faintness    . Tachycardia   . Hematochezia   . Pancreatic cancer ([place])   . Duodenal stricture   . Obstructive jaundice 01/02/2015  . Pruritus 09/14/2014  . Transaminitis 09/14/2014  . Adenocarcinoma of pancreas ([place]) 09/05/2014  . Pancreatitis 09/05/2014  . Chest pain 01/24/2014  . Dysphagia 11/25/2012  . Adenomatous polyp of colon 08/06/2012  . BARRETTS ESOPHAGUS 02/26/2010  . ALCOHOL USE 09/29/2008  . GOUT, UNSPECIFIED 09/23/2008  . Essential hypertension 09/23/2008  . SINUSITIS, CHRONIC 09/23/2008  . GERD 09/23/2008  . Nonulcer dyspepsia 09/23/2008  . ARTHRITIS 09/23/2008    Guadelupe Sabin, OTR/L  [phone]  07/14/2015, 3:44 PM  [facility] [address] [place], [place], [address] Phone: [phone]   Fax:  [phone]  Name: PTOLEMY SAVANNAH MRN: [id number] Date of Birth: Sep 14, 1935

## 2015-06-22 NOTE — Progress Notes (Signed)
REVIEWED-NO INDICATION FOR FOLLOW UP ENDOSCOPY AT THIS TIME. EPIGASTRIC PAIN DUE TO PROGRESSIVE PANCREATIC CANCER AND GOO.

## 2015-06-23 NOTE — Progress Notes (Signed)
Can you please let patient know that repeat EGD to verify ulcer healing is not needed. See slf recommendations.

## 2015-06-26 NOTE — Progress Notes (Signed)
PT is aware.

## 2015-07-10 DIAGNOSIS — I10 Essential (primary) hypertension: Secondary | ICD-10-CM | POA: Diagnosis not present

## 2015-07-10 DIAGNOSIS — Z881 Allergy status to other antibiotic agents status: Secondary | ICD-10-CM | POA: Diagnosis not present

## 2015-07-10 DIAGNOSIS — Z885 Allergy status to narcotic agent status: Secondary | ICD-10-CM | POA: Diagnosis not present

## 2015-07-10 DIAGNOSIS — Z9221 Personal history of antineoplastic chemotherapy: Secondary | ICD-10-CM | POA: Diagnosis not present

## 2015-07-10 DIAGNOSIS — G893 Neoplasm related pain (acute) (chronic): Secondary | ICD-10-CM | POA: Diagnosis not present

## 2015-07-10 DIAGNOSIS — Z886 Allergy status to analgesic agent status: Secondary | ICD-10-CM | POA: Diagnosis not present

## 2015-07-10 DIAGNOSIS — Z923 Personal history of irradiation: Secondary | ICD-10-CM | POA: Diagnosis not present

## 2015-07-10 DIAGNOSIS — Z79899 Other long term (current) drug therapy: Secondary | ICD-10-CM | POA: Diagnosis not present

## 2015-07-10 DIAGNOSIS — C250 Malignant neoplasm of head of pancreas: Secondary | ICD-10-CM | POA: Diagnosis not present

## 2015-07-10 DIAGNOSIS — Z79891 Long term (current) use of opiate analgesic: Secondary | ICD-10-CM | POA: Diagnosis not present

## 2015-07-10 DIAGNOSIS — C7800 Secondary malignant neoplasm of unspecified lung: Secondary | ICD-10-CM | POA: Diagnosis not present

## 2015-07-18 DIAGNOSIS — H40023 Open angle with borderline findings, high risk, bilateral: Secondary | ICD-10-CM | POA: Diagnosis not present

## 2015-07-18 DIAGNOSIS — H0289 Other specified disorders of eyelid: Secondary | ICD-10-CM | POA: Diagnosis not present

## 2015-07-19 ENCOUNTER — Encounter: Payer: Self-pay | Admitting: Nurse Practitioner

## 2015-07-19 ENCOUNTER — Ambulatory Visit (INDEPENDENT_AMBULATORY_CARE_PROVIDER_SITE_OTHER): Payer: Medicare Other | Admitting: Nurse Practitioner

## 2015-07-19 DIAGNOSIS — C250 Malignant neoplasm of head of pancreas: Secondary | ICD-10-CM

## 2015-07-19 DIAGNOSIS — Z0000 Encounter for general adult medical examination without abnormal findings: Secondary | ICD-10-CM | POA: Diagnosis not present

## 2015-07-19 DIAGNOSIS — K264 Chronic or unspecified duodenal ulcer with hemorrhage: Secondary | ICD-10-CM | POA: Diagnosis not present

## 2015-07-19 DIAGNOSIS — Z6824 Body mass index (BMI) 24.0-24.9, adult: Secondary | ICD-10-CM | POA: Diagnosis not present

## 2015-07-19 DIAGNOSIS — E782 Mixed hyperlipidemia: Secondary | ICD-10-CM | POA: Diagnosis not present

## 2015-07-19 DIAGNOSIS — Z125 Encounter for screening for malignant neoplasm of prostate: Secondary | ICD-10-CM | POA: Diagnosis not present

## 2015-07-19 DIAGNOSIS — R7309 Other abnormal glucose: Secondary | ICD-10-CM | POA: Diagnosis not present

## 2015-07-19 DIAGNOSIS — Z1389 Encounter for screening for other disorder: Secondary | ICD-10-CM | POA: Diagnosis not present

## 2015-07-19 NOTE — Assessment & Plan Note (Signed)
Unresectable pancreatic cancer status post biliary decompression with stent placement for palliative treatment. Attempted palliative care chemotherapy with intolerance and is declining further treatment. Abdominal pain is ongoing and he has had increasing amount of pain medication she is taking. Carafate also helps. Recommend continue Carafate, pain medications as needed. Notify oncology if worsening pain and medications not effective. Is requesting referral from PCP for [facility] due to distance required to drive to [place]. At this point return for follow-up as needed.

## 2015-07-19 NOTE — Assessment & Plan Note (Signed)
Large duodenal ulcer with large exposed vessel and associated GI bleeding status post embolization at [facility]. No recurrent GI bleed noted. Per Dr. Oneida Alar, no indications for surveillance endoscopy given overlying pancreatic cancer history. Continue PPI. Return for follow-up as needed.

## 2015-07-19 NOTE — Patient Instructions (Signed)
1. Keep taking pain medications and carafate as needed. 2. Call your PCP for referral to [facility]. 3. Call us if you need anything, return for follow-up as needed.

## 2015-07-19 NOTE — Progress Notes (Signed)
Referring Provider: Sharilyn Sites, MD Primary Care Physician:  [name], MD Primary GI:  Dr. Oneida Alar  Chief Complaint  Patient presents with  . Abdominal Pain    HPI:   William Rivera is a 80 y.o. male who presents for follow-up on abdominal pain. The patient has progressive pancreatic adenocarcinoma with status post placement of a metal biliary stent due to unresectable nature of the cancer, trialed chemotherapy awake force [facility] which was stopped due to intolerance. He was last seen in our office 06/14/2015 for follow-up on GI bleed/duodenal ulcer. Had a GI bleed just prior to last visit due to large duodenal ulcer with large visible vessel. No intervention performed on EGD, was transferred to come and underwent embolization with interventional radiology and hemoglobin noted 10.3 at discharge. No continued overt bleeding, continued postprandial pain likely due to pancreatic carcinoma. A 1 time refill of his oxycodone was given to last until he can follow-up with oncology. Per Dr. Oneida Alar, no indication for follow-up endoscopy for ulcer healing due to epigastric pain likely due to progressive pancreatic cancer and GOO.  Today he states he was doing well after embolization. A week or two his pain began becoming worse, previously taking one oxycodone a day now up to 2-3 a day. Denies hematochezia and melena, diarrhea, N/V. Is wondering if he needs another scope done to evaluate. He is trying to get setup at [facility] so he doesn't have to drive to [place] as they are only following conservatively. Carafate helps, ran out and PCP refilled for him. Denies chest pain, dyspnea, dizziness, lightheadedness, syncope, near syncope. Denies any other upper or lower GI symptoms.  Past Medical History  Diagnosis Date  . Hypertension   . Gout   . Barrett's esophagus     last EGD/Bx 12/11  . Helicobacter pylori gastritis 2000    s/p treatment  .  Adenomatous colon polyp 2010  . GERD (gastroesophageal reflux disease)   . GASTRITIS 09/23/2008    Qualifier: Diagnosis of  By: Nicole Kindred LPN, Doris    . Cancer of pancreas, other site AUG 2016    CA 19-9 958.6  . Pancreatic cancer ([place])   . Pancreatic cancer [facility])     Past Surgical History  Procedure Laterality Date  . Esophagogastroduodenoscopy  2008    DUODENITIS & GASTRITIS 2o TO NSAIDS/ETOH  . Colonoscopy  2010    simple adenomas  . Neck surgery      DISC REPLACED  . Bladder surgery  [id number]  . Sinus exploration  2000  . Tonsillectomy      AS A CHILD  . Esophagogastroduodenoscopy  12/2009    short segment Barrett's, small hh, chronic gastritis  . Nasal septum surgery    . Esophagogastroduodenoscopy  01/04/2011    [id number] gastritis/Barrett's, possible  . Esophagogastroduodenoscopy N/A 11/30/2012    Dr. Oneida Alar- normal esophagus, empiric dilation d/t c/o dysphagia/?cervical web, stomach= mild non erosive gastritis, inflammation on bx, duodenum= no abnormalities in the bulb and second portion of the duodenum. dilation at the gastroesphageal junction.  Azzie Almas dilation N/A 11/30/2012    Procedure: SAVORY DILATION;  Surgeon: Danie Binder, MD;  [facility];  Service: Endoscopy;  Laterality: N/A;  Venia Minks dilation N/A 11/30/2012    Procedure: Venia Minks DILATION;  Surgeon: Danie Binder, MD;  Location: [facility];  Service: Endoscopy;  Laterality: N/A;  . 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 Colonoscopy N/A 07/28/2013  Dr. Oneida Alar: tubular adenoma, mild sigmoid colon diverticulosis, internal hemorhhoids   . Doppler echocardiography  2009  . Nm myoview ltd  2009  . Ercp N/A 09/14/2014    Dr. Laural Golden: sphincterotomy with 35 F plastic biliary stent placement   . Biliary stent placement N/A 09/14/2014    Procedure: BILIARY STENT PLACEMENT;  Surgeon: Rogene Houston, MD;  Location: [facility] ORS;  Service: Endoscopy;  Laterality: N/A;  . Sphincterotomy N/A 09/14/2014     Procedure: SPHINCTEROTOMY;  Surgeon: Rogene Houston, MD;  Location: [facility] ORS;  Service: Endoscopy;  Laterality: N/A;  . Esophagogastroduodenoscopy (egd) with propofol N/A 01/04/2015    RMR: partial uoddenal obstruction at junction of 1st and 2nd portion of duodenum, unale to intubate 2nd portion of duodenum to perform ERCP  . Ercp N/A 01/04/2015    Procedure: ATTEMPTED ENDOSCOPIC RETROGRADE CHOLANGIOPANCREATOGRAPHY (ERCP);  Surgeon: Daneil Dolin, MD;  Location: [facility] ORS;  Service: Endoscopy;  Laterality: N/A;  . Stents in bile ducts    . Hemorrhoid surgery    . Ercp  02/2015    [facility]: biliary stent exchange from plastic to metal stent  . Esophagogastroduodenoscopy N/A 06/02/2015    SLF: 1. normal esophagus 2. gastrtitis mild 3. GI bleed due to large duodenal ulcer with large visible vessel, high risk to rebleed without prevention     Current Outpatient Prescriptions  Medication Sig Dispense Refill  . Artificial Tear Ointment (DRY EYES OP) Apply 1-2 drops to eye daily as needed (dry eyes).    . feeding supplement (BOOST / RESOURCE BREEZE) LIQD Take 1 Container by mouth 3 (three) times daily with meals. 90 Container 0  . HYDROcodone-acetaminophen (NORCO) 10-325 MG tablet Take 1 tablet by mouth every 6 (six) hours as needed.    . ondansetron (ZOFRAN) 8 MG tablet Take 8 mg by mouth every 8 (eight) hours as needed for nausea or vomiting.     . Oxycodone HCl 10 MG TABS Take 1 tablet (10 mg total) by mouth every 6 (six) hours as needed. 90 tablet 0  . pantoprazole (PROTONIX) 40 MG tablet Take 1 tablet (40 mg total) by mouth 2 (two) times daily. 80 tablet 1  . CREON 24000 units CPEP Take 2 capsules by mouth 4 (four) times daily. Reported on 07/19/2015  1  . ferrous sulfate 300 (60 Fe) MG/5ML syrup Take 5 mLs (300 mg total) by mouth daily. (Patient not taking: Reported on 07/19/2015) 150 mL 3  . simethicone (MYLICON) 0000000 MG chewable tablet Chew 125 mg by mouth every 6 (six) hours as needed for flatulence.  Reported on 07/19/2015     No current facility-administered medications for this visit.    Allergies as of 07/19/2015 - Review Complete 07/19/2015  Allergen Reaction Noted  . Doxycycline Itching 03/21/2015  . Colestipol Itching 09/14/2014  . Doxycycline Rash 09/05/2014    Family History  Problem Relation Age of Onset  . Stomach cancer Father 50    deceased  . Colon cancer Neg Hx     Social History   Social History  . Marital Status: Married    Spouse Name: N/A  . Number of Children: 1  . Years of Education: N/A   Occupational History  . truck driver    Social History Main Topics  . Smoking status: Never Smoker   . Smokeless tobacco: Never Used     Comment: Never smoked much  . Alcohol Use: No     Comment: former drinks about a pint of borboun  per week  . Drug Use: No  . Sexual Activity:    Partners: Female    Museum/gallery curator: None   Other Topics Concern  . None   Social History Narrative    Review of Systems: 10-point ROS negative except as per HPI.   Physical Exam: BP 110/68 mmHg  Pulse 93  Temp(Src) 97.6 F (36.4 C) (Oral)  Ht 5\' 9"  (1.753 m)  Wt 163 lb 12.8 oz (74.299 kg)  BMI 24.18 kg/m2 General:   Alert and oriented. Pleasant and cooperative. Well-nourished and well-developed.  Eyes:  Without icterus, sclera clear and conjunctiva pink.  Ears:  Normal auditory acuity. Cardiovascular:  S1, S2 present without murmurs appreciated. Extremities without clubbing or edema. Respiratory:  Clear to auscultation bilaterally. No wheezes, rales, or rhonchi. No distress.  Gastrointestinal:  +BS, soft, and non-distended. Moderate to significant periumbilical pain. No guarding or rebound.  Rectal:  Deferred  Musculoskalatal:  Symmetrical without gross deformities. Neurologic:  Alert and oriented x4;  grossly normal neurologically. Psych:  Alert and cooperative. Normal mood and affect. Heme/Lymph/Immune: No excessive bruising noted.    07/19/2015  2:07 PM   Disclaimer: This note was dictated with voice recognition software. Similar sounding words can inadvertently be transcribed and may not be corrected upon review.

## 2015-07-20 NOTE — Progress Notes (Signed)
cc'ed to pcp °

## 2015-08-10 DIAGNOSIS — C250 Malignant neoplasm of head of pancreas: Secondary | ICD-10-CM | POA: Diagnosis not present

## 2015-08-10 DIAGNOSIS — C7800 Secondary malignant neoplasm of unspecified lung: Secondary | ICD-10-CM | POA: Diagnosis not present

## 2015-08-11 ENCOUNTER — Emergency Department (HOSPITAL_COMMUNITY): Payer: Medicare Other

## 2015-08-11 ENCOUNTER — Emergency Department (HOSPITAL_COMMUNITY)
Admission: EM | Admit: 2015-08-11 | Discharge: 2015-08-11 | Disposition: A | Discharge status: Home / Self Care | Payer: Medicare Other | Attending: Emergency Medicine | Admitting: Emergency Medicine

## 2015-08-11 ENCOUNTER — Encounter (HOSPITAL_COMMUNITY): Payer: Self-pay | Admitting: *Deleted

## 2015-08-11 DIAGNOSIS — R1084 Generalized abdominal pain: Secondary | ICD-10-CM | POA: Insufficient documentation

## 2015-08-11 DIAGNOSIS — Z79899 Other long term (current) drug therapy: Secondary | ICD-10-CM | POA: Insufficient documentation

## 2015-08-11 DIAGNOSIS — Z96651 Presence of right artificial knee joint: Secondary | ICD-10-CM | POA: Insufficient documentation

## 2015-08-11 DIAGNOSIS — C259 Malignant neoplasm of pancreas, unspecified: Secondary | ICD-10-CM | POA: Diagnosis not present

## 2015-08-11 DIAGNOSIS — Z8507 Personal history of malignant neoplasm of pancreas: Secondary | ICD-10-CM | POA: Diagnosis not present

## 2015-08-11 DIAGNOSIS — I10 Essential (primary) hypertension: Secondary | ICD-10-CM | POA: Insufficient documentation

## 2015-08-11 DIAGNOSIS — R109 Unspecified abdominal pain: Secondary | ICD-10-CM

## 2015-08-11 LAB — CBC WITH DIFFERENTIAL/PLATELET
BASOS ABS: 0 10*3/uL (ref 0.0–0.1)
Basophils Relative: 0 %
EOS PCT: 0 %
Eosinophils Absolute: 0 10*3/uL (ref 0.0–0.7)
HCT: 35.2 % — ABNORMAL LOW (ref 39.0–52.0)
HEMOGLOBIN: 12.3 g/dL — AB (ref 13.0–17.0)
LYMPHS PCT: 13 %
Lymphs Abs: 1.1 10*3/uL (ref 0.7–4.0)
MCH: 31.5 pg (ref 26.0–34.0)
MCHC: 34.9 g/dL (ref 30.0–36.0)
MCV: 90.3 fL (ref 78.0–100.0)
Monocytes Absolute: 0.9 10*3/uL (ref 0.1–1.0)
Monocytes Relative: 10 %
NEUTROS ABS: 6.5 10*3/uL (ref 1.7–7.7)
NEUTROS PCT: 77 %
PLATELETS: 219 10*3/uL (ref 150–400)
RBC: 3.9 MIL/uL — AB (ref 4.22–5.81)
RDW: 14.6 % (ref 11.5–15.5)
WBC: 8.4 10*3/uL (ref 4.0–10.5)

## 2015-08-11 LAB — COMPREHENSIVE METABOLIC PANEL
ALBUMIN: 3.8 g/dL (ref 3.5–5.0)
ALT: 30 U/L (ref 17–63)
ANION GAP: 4 — AB (ref 5–15)
AST: 26 U/L (ref 15–41)
Alkaline Phosphatase: 126 U/L (ref 38–126)
BUN: 27 mg/dL — AB (ref 6–20)
CHLORIDE: 107 mmol/L (ref 101–111)
CO2: 26 mmol/L (ref 22–32)
Calcium: 9.3 mg/dL (ref 8.9–10.3)
Creatinine, Ser: 1.51 mg/dL — ABNORMAL HIGH (ref 0.61–1.24)
GFR calc Af Amer: 50 mL/min — ABNORMAL LOW (ref >60)
GFR calc non Af Amer: 43 mL/min — ABNORMAL LOW (ref >60)
GLUCOSE: 153 mg/dL — AB (ref 65–99)
POTASSIUM: 4.1 mmol/L (ref 3.5–5.1)
SODIUM: 137 mmol/L (ref 135–145)
TOTAL PROTEIN: 7.6 g/dL (ref 6.5–8.1)
Total Bilirubin: 0.4 mg/dL (ref 0.3–1.2)

## 2015-08-11 LAB — URINALYSIS, ROUTINE W REFLEX MICROSCOPIC
Bilirubin Urine: NEGATIVE
Glucose, UA: NEGATIVE mg/dL
Hgb urine dipstick: NEGATIVE
Ketones, ur: NEGATIVE mg/dL
LEUKOCYTES UA: NEGATIVE
NITRITE: NEGATIVE
PROTEIN: 30 mg/dL — AB
SPECIFIC GRAVITY, URINE: 1.02 (ref 1.005–1.030)
pH: 5.5 (ref 5.0–8.0)

## 2015-08-11 LAB — URINE MICROSCOPIC-ADD ON
Bacteria, UA: NONE SEEN
SQUAMOUS EPITHELIAL / LPF: NONE SEEN
WBC UA: NONE SEEN WBC/hpf (ref 0–5)

## 2015-08-11 LAB — LIPASE, BLOOD: LIPASE: 33 U/L (ref 11–51)

## 2015-08-11 MED ORDER — SODIUM CHLORIDE 0.9 % IV BOLUS (SEPSIS)
500.0000 mL | Freq: Once | INTRAVENOUS | Status: AC
  Administered 2015-08-11: 500 mL via INTRAVENOUS

## 2015-08-11 MED ORDER — HYDROMORPHONE HCL 1 MG/ML IJ SOLN
1.0000 mg | Freq: Once | INTRAMUSCULAR | Status: AC
  Administered 2015-08-11: 1 mg via INTRAVENOUS
  Filled 2015-08-11: qty 1

## 2015-08-11 MED ORDER — HYDROMORPHONE HCL 4 MG PO TABS: 4.0000 mg | ORAL_TABLET | Freq: Four times a day (QID) | ORAL | Status: DC | PRN

## 2015-08-11 MED ORDER — ONDANSETRON HCL 4 MG/2ML IJ SOLN
4.0000 mg | Freq: Once | INTRAMUSCULAR | Status: AC
  Administered 2015-08-11: 4 mg via INTRAVENOUS
  Filled 2015-08-11: qty 2

## 2015-08-11 MED ORDER — FAMOTIDINE IN NACL 20-0.9 MG/50ML-% IV SOLN
20.0000 mg | Freq: Once | INTRAVENOUS | Status: AC
  Administered 2015-08-11: 20 mg via INTRAVENOUS
  Filled 2015-08-11: qty 50

## 2015-08-11 NOTE — ED Provider Notes (Signed)
CSN: [id number]     Arrival date & time 08/11/15  [id number] History   First MD Initiated Contact with Patient 08/11/15 [phone]     Chief Complaint  Patient presents with  . Abdominal Pain     (Consider location/radiation/quality/duration/timing/severity/associated sxs/prior Treatment) Patient is a 80 y.o. male presenting with abdominal pain. The history is provided by the patient (Patient complains of abdominal discomfort. He takes hydrocodone for pain for his pancreatic cancer).  Abdominal Pain Pain location:  Generalized Pain quality: aching   Pain radiates to:  Does not radiate Pain severity:  Moderate Onset quality:  Sudden Timing:  Intermittent Progression:  Waxing and waning Chronicity:  Recurrent Associated symptoms: no chest pain, no cough, no diarrhea, no fatigue and no hematuria     Past Medical History  Diagnosis Date  . Hypertension   . Gout   . Barrett's esophagus     last EGD/Bx 12/11  . Helicobacter pylori gastritis 2000    s/p treatment  . Adenomatous colon polyp 2010  . GERD (gastroesophageal reflux disease)   . GASTRITIS 09/23/2008    Qualifier: Diagnosis of  By: Nicole Kindred LPN, Doris    . Cancer of pancreas, other site AUG 2016    CA 19-9 958.6  . Pancreatic cancer ([place])   . Pancreatic cancer [facility])    Past Surgical History  Procedure Laterality Date  . Esophagogastroduodenoscopy  2008    DUODENITIS & GASTRITIS 2o TO NSAIDS/ETOH  . Colonoscopy  2010    simple adenomas  . Neck surgery      DISC REPLACED  . Bladder surgery  [id number]  . Sinus exploration  2000  . Tonsillectomy      AS A CHILD  . Esophagogastroduodenoscopy  12/2009    short segment Barrett's, small hh, chronic gastritis  . Nasal septum surgery    . Esophagogastroduodenoscopy  01/04/2011    [id number] gastritis/Barrett's, possible  . Esophagogastroduodenoscopy N/A 11/30/2012    Dr. Oneida Alar- normal esophagus, empiric dilation d/t c/o dysphagia/?cervical web, stomach= mild non erosive gastritis,  inflammation on bx, duodenum= no abnormalities in the bulb and second portion of the duodenum. dilation at the gastroesphageal junction.  Azzie Almas dilation N/A 11/30/2012    Procedure: SAVORY DILATION;  Surgeon: Danie Binder, MD;  [facility];  Service: Endoscopy;  Laterality: N/A;  Venia Minks dilation N/A 11/30/2012    Procedure: Venia Minks DILATION;  Surgeon: Danie Binder, MD;  Location: [facility];  Service: Endoscopy;  Laterality: N/A;  . 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 Colonoscopy N/A 07/28/2013    Dr. Oneida Alar: tubular adenoma, mild sigmoid colon diverticulosis, internal hemorhhoids   . Doppler echocardiography  2009  . Nm myoview ltd  2009  . Ercp N/A 09/14/2014    Dr. Laural Golden: sphincterotomy with 44 F plastic biliary stent placement   . Biliary stent placement N/A 09/14/2014    Procedure: BILIARY STENT PLACEMENT;  Surgeon: Rogene Houston, MD;  Location: [facility] ORS;  Service: Endoscopy;  Laterality: N/A;  . Sphincterotomy N/A 09/14/2014    Procedure: SPHINCTEROTOMY;  Surgeon: Rogene Houston, MD;  Location: [facility] ORS;  Service: Endoscopy;  Laterality: N/A;  . Esophagogastroduodenoscopy (egd) with propofol N/A 01/04/2015    RMR: partial uoddenal obstruction at junction of 1st and 2nd portion of duodenum, unale to intubate 2nd portion of duodenum to perform ERCP  . Ercp N/A 01/04/2015    Procedure: ATTEMPTED ENDOSCOPIC RETROGRADE CHOLANGIOPANCREATOGRAPHY (ERCP);  Surgeon: Daneil Dolin, MD;  Location: [facility] ORS;  Service: Endoscopy;  Laterality: N/A;  . Stents in bile ducts    . Hemorrhoid surgery    . Ercp  02/2015    [facility]: biliary stent exchange from plastic to metal stent  . Esophagogastroduodenoscopy N/A 06/02/2015    SLF: 1. normal esophagus 2. gastrtitis mild 3. GI bleed due to large duodenal ulcer with large visible vessel, high risk to rebleed without prevention    Family History  Problem Relation Age of Onset  . Stomach cancer Father 78    deceased  . Colon cancer  Neg Hx    Social History  Substance Use Topics  . Smoking status: Never Smoker   . Smokeless tobacco: Never Used     Comment: Never smoked much  . Alcohol Use: No     Comment: former drinks about a pint of borboun per week    Review of Systems  Constitutional: Negative for appetite change and fatigue.  HENT: Negative for congestion, ear discharge and sinus pressure.   Eyes: Negative for discharge.  Respiratory: Negative for cough.   Cardiovascular: Negative for chest pain.  Gastrointestinal: Positive for abdominal pain. Negative for diarrhea.  Genitourinary: Negative for frequency and hematuria.  Musculoskeletal: Negative for back pain.  Skin: Negative for rash.  Neurological: Negative for seizures and headaches.  Psychiatric/Behavioral: Negative for hallucinations.      Allergies  Doxycycline; Colestipol; and Doxycycline  Home Medications   Prior to Admission medications   Medication Sig Start Date End Date Taking? Authorizing Provider  Artificial Tear Ointment (DRY EYES OP) Apply 1-2 drops to eye daily as needed (dry eyes).   Yes Historical Provider, MD  feeding supplement (BOOST / RESOURCE BREEZE) LIQD Take 1 Container by mouth 3 (three) times daily with meals. 06/05/15  Yes Reyne Dumas, MD  HYDROcodone-acetaminophen (NORCO) 10-325 MG tablet Take 1 tablet by mouth every 6 (six) hours as needed.   Yes Historical Provider, MD  ondansetron (ZOFRAN) 8 MG tablet Take 8 mg by mouth every 8 (eight) hours as needed for nausea or vomiting.  10/06/14  Yes Historical Provider, MD  pantoprazole (PROTONIX) 40 MG tablet Take 1 tablet (40 mg total) by mouth 2 (two) times daily. 06/05/15  Yes Reyne Dumas, MD  simethicone (MYLICON) 0000000 MG chewable tablet Chew 125 mg by mouth every 6 (six) hours as needed for flatulence. Reported on 07/19/2015   Yes Historical Provider, MD  sucralfate (CARAFATE) 1 g tablet Take 1 g by mouth 3 (three) times daily.   Yes Historical Provider, MD  HYDROmorphone  (DILAUDID) 4 MG tablet Take 1 tablet (4 mg total) by mouth every 6 (six) hours as needed for severe pain. 08/11/15   Milton Ferguson, MD  Oxycodone HCl 10 MG TABS Take 1 tablet (10 mg total) by mouth every 6 (six) hours as needed. Patient not taking: Reported on 08/11/2015 06/14/15   Mahala Menghini, PA-C   BP 111/74 mmHg  Pulse 82  Temp(Src) 98.3 F (36.8 C) (Oral)  Resp 17  Ht 5\' 9"  (1.753 m)  Wt 157 lb (71.215 kg)  BMI 23.17 kg/m2  SpO2 97% Physical Exam  Constitutional: He is oriented to person, place, and time. He appears well-developed.  HENT:  Head: Normocephalic.  Eyes: Conjunctivae and EOM are normal. No scleral icterus.  Neck: Neck supple. No thyromegaly present.  Cardiovascular: Normal rate and regular rhythm.  Exam reveals no gallop and no friction rub.   No murmur heard. Pulmonary/Chest: No stridor. He has no wheezes. He has no rales.  He exhibits no tenderness.  Abdominal: He exhibits no distension. There is tenderness. There is no rebound.  Mild peri-umbilical tenderness  Musculoskeletal: Normal range of motion. He exhibits no edema.  Lymphadenopathy:    He has no cervical adenopathy.  Neurological: He is oriented to person, place, and time. He exhibits normal muscle tone. Coordination normal.  Skin: No rash noted. No erythema.  Psychiatric: He has a normal mood and affect. His behavior is normal.    ED Course  Procedures (including critical care time) Labs Review Labs Reviewed  COMPREHENSIVE METABOLIC PANEL - Abnormal; Notable for the following:    Glucose, Bld 153 (*)    BUN 27 (*)    Creatinine, Ser 1.51 (*)    GFR calc non Af Amer 43 (*)    GFR calc Af Amer 50 (*)    Anion gap 4 (*)    All other components within normal limits  URINALYSIS, ROUTINE W REFLEX MICROSCOPIC (NOT AT [facility]) - Abnormal; Notable for the following:    Protein, ur 30 (*)    All other components within normal limits  CBC WITH DIFFERENTIAL/PLATELET - Abnormal; Notable for the following:     RBC 3.90 (*)    Hemoglobin 12.3 (*)    HCT 35.2 (*)    All other components within normal limits  LIPASE, BLOOD  URINE MICROSCOPIC-ADD ON    Imaging Review Ct Abdomen Pelvis Wo Contrast  08/11/2015  CLINICAL DATA:  Pancreatic cancer. EXAM: CT ABDOMEN AND PELVIS WITHOUT CONTRAST TECHNIQUE: Multidetector CT imaging of the abdomen and pelvis was performed following the standard protocol without IV contrast. COMPARISON:  06/01/2015 FINDINGS: Lower chest: Innumerable bilateral pulmonary nodules are again noted. They are more conspicuous and appear slightly progressed in the interval. 4 mm posterior right lower lobe pulmonary nodule seen on previous study is now 6 mm. 9 mm subpleural nodule measured on the previous study is now 15 mm. Hepatobiliary: Pneumobilia again noted in the left hepatic lobe. No change in the probable cyst along the gallbladder fossa. Gallbladder unremarkable. Common bile duct stent remains in place. Pancreas: Discrete pancreatic head mass not apparent on this study without intravenous contrast and measurements on previous study included the duodenum. Overall, imaging appearance at the level of the pancreatic head does not appear substantially changed. Patient is noted to have new embolization coils in the gastroduodenal artery. Spleen: No splenomegaly. No focal mass lesion. Adrenals/Urinary Tract: No adrenal nodule or mass. Lymph kidney is markedly scarred and atrophic. Cortical scarring is seen in the right kidney. No hydronephrosis on either side. No evidence for hydroureter. The urinary bladder appears normal for the degree of distention. Stomach/Bowel: Stomach is mildly distended. Duodenum is nondistended. No small bowel wall thickening. No small bowel dilatation. The terminal ileum is normal. The appendix is normal. No gross colonic mass. No colonic wall thickening. No substantial diverticular change. Vascular/Lymphatic: There is abdominal aortic atherosclerosis without aneurysm.  Haziness/ edema in the root of the small bowel mesentery is stable to slightly progressed in the interval. No bulky abdominal lymphadenopathy. No pelvic sidewall lymphadenopathy. Reproductive: The prostate gland and seminal vesicles have normal imaging features. Other: No intraperitoneal free fluid. Musculoskeletal: Bone windows reveal no worrisome lytic or sclerotic osseous lesions. IMPRESSION: 1. No substantial change in pancreatic head mass. 2. Innumerable tiny bilateral pulmonary nodules appear progressed in the interval and some of the larger, more discrete nodules measured previously have progressed. 3. Interval embolization of the gastroduodenal artery. Electronically Signed   By: Randall Hiss  Tery Sanfilippo M.D.   On: 08/11/2015 09:41   I have personally reviewed and evaluated these images and lab results as part of my medical decision-making.   EKG Interpretation None      MDM   Final diagnoses:  Abdominal pain in male    Labs unremarkable. CT scan shows no acute changes. Patient with pancreatic cancer. No longer getting help with hydrocodone for his abdominal pain. Patient improved with Dilaudid IV. Patient will be given 2 weeks worth of Dilaudid to help with his metastatic pancreatic cancer  and will follow-up with his doctor    Milton Ferguson, MD 08/11/15 1154

## 2015-08-11 NOTE — ED Notes (Signed)
P comes in for upper left quadrant abdominal pain. Pt verbalizes that he has pancreatic cancer. This pain started last week. Pt states he had one episode of emesis last night, denies any diarrhea.

## 2015-08-11 NOTE — Discharge Instructions (Signed)
Follow up with your md in 1-2 weeks. °

## 2015-08-11 NOTE — ED Notes (Signed)
Not able to void at this time.

## 2015-08-11 NOTE — ED Notes (Signed)
Patient states pain is easing. Patient still unable to give a urine sample at this time.

## 2015-08-15 ENCOUNTER — Telehealth: Payer: Self-pay | Admitting: Gastroenterology

## 2015-08-15 ENCOUNTER — Ambulatory Visit (HOSPITAL_COMMUNITY): Admission: RE | Admit: 2015-08-15 | Payer: Medicare Other | Source: Ambulatory Visit

## 2015-08-15 ENCOUNTER — Other Ambulatory Visit: Payer: Self-pay

## 2015-08-15 DIAGNOSIS — K830 Cholangitis: Secondary | ICD-10-CM | POA: Diagnosis not present

## 2015-08-15 DIAGNOSIS — I499 Cardiac arrhythmia, unspecified: Secondary | ICD-10-CM | POA: Diagnosis not present

## 2015-08-15 DIAGNOSIS — I129 Hypertensive chronic kidney disease with stage 1 through stage 4 chronic kidney disease, or unspecified chronic kidney disease: Secondary | ICD-10-CM | POA: Diagnosis present

## 2015-08-15 DIAGNOSIS — R109 Unspecified abdominal pain: Secondary | ICD-10-CM | POA: Diagnosis not present

## 2015-08-15 DIAGNOSIS — K219 Gastro-esophageal reflux disease without esophagitis: Secondary | ICD-10-CM | POA: Diagnosis not present

## 2015-08-15 DIAGNOSIS — I471 Supraventricular tachycardia: Secondary | ICD-10-CM | POA: Diagnosis not present

## 2015-08-15 DIAGNOSIS — J9811 Atelectasis: Secondary | ICD-10-CM | POA: Diagnosis not present

## 2015-08-15 DIAGNOSIS — A419 Sepsis, unspecified organism: Secondary | ICD-10-CM | POA: Diagnosis not present

## 2015-08-15 DIAGNOSIS — Z86010 Personal history of colonic polyps: Secondary | ICD-10-CM | POA: Diagnosis not present

## 2015-08-15 DIAGNOSIS — K311 Adult hypertrophic pyloric stenosis: Secondary | ICD-10-CM

## 2015-08-15 DIAGNOSIS — R000 Tachycardia, unspecified: Secondary | ICD-10-CM | POA: Diagnosis not present

## 2015-08-15 DIAGNOSIS — Z923 Personal history of irradiation: Secondary | ICD-10-CM | POA: Diagnosis not present

## 2015-08-15 DIAGNOSIS — J309 Allergic rhinitis, unspecified: Secondary | ICD-10-CM | POA: Diagnosis not present

## 2015-08-15 DIAGNOSIS — I4891 Unspecified atrial fibrillation: Secondary | ICD-10-CM | POA: Diagnosis not present

## 2015-08-15 DIAGNOSIS — Z66 Do not resuscitate: Secondary | ICD-10-CM | POA: Diagnosis present

## 2015-08-15 DIAGNOSIS — N189 Chronic kidney disease, unspecified: Secondary | ICD-10-CM | POA: Diagnosis present

## 2015-08-15 DIAGNOSIS — C7800 Secondary malignant neoplasm of unspecified lung: Secondary | ICD-10-CM | POA: Diagnosis not present

## 2015-08-15 DIAGNOSIS — I10 Essential (primary) hypertension: Secondary | ICD-10-CM | POA: Diagnosis not present

## 2015-08-15 DIAGNOSIS — G8929 Other chronic pain: Secondary | ICD-10-CM | POA: Diagnosis present

## 2015-08-15 DIAGNOSIS — I4892 Unspecified atrial flutter: Secondary | ICD-10-CM | POA: Diagnosis not present

## 2015-08-15 DIAGNOSIS — C250 Malignant neoplasm of head of pancreas: Secondary | ICD-10-CM | POA: Diagnosis not present

## 2015-08-15 DIAGNOSIS — R918 Other nonspecific abnormal finding of lung field: Secondary | ICD-10-CM | POA: Diagnosis not present

## 2015-08-15 DIAGNOSIS — R112 Nausea with vomiting, unspecified: Secondary | ICD-10-CM | POA: Diagnosis not present

## 2015-08-15 DIAGNOSIS — I959 Hypotension, unspecified: Secondary | ICD-10-CM | POA: Diagnosis not present

## 2015-08-15 DIAGNOSIS — I480 Paroxysmal atrial fibrillation: Secondary | ICD-10-CM | POA: Diagnosis not present

## 2015-08-15 DIAGNOSIS — C259 Malignant neoplasm of pancreas, unspecified: Secondary | ICD-10-CM | POA: Diagnosis not present

## 2015-08-15 DIAGNOSIS — K8689 Other specified diseases of pancreas: Secondary | ICD-10-CM | POA: Diagnosis not present

## 2015-08-15 DIAGNOSIS — R509 Fever, unspecified: Secondary | ICD-10-CM | POA: Diagnosis not present

## 2015-08-15 DIAGNOSIS — Z9221 Personal history of antineoplastic chemotherapy: Secondary | ICD-10-CM | POA: Diagnosis not present

## 2015-08-15 DIAGNOSIS — D72829 Elevated white blood cell count, unspecified: Secondary | ICD-10-CM | POA: Diagnosis not present

## 2015-08-15 DIAGNOSIS — R079 Chest pain, unspecified: Secondary | ICD-10-CM | POA: Diagnosis not present

## 2015-08-15 DIAGNOSIS — R1013 Epigastric pain: Secondary | ICD-10-CM | POA: Diagnosis not present

## 2015-08-15 DIAGNOSIS — K869 Disease of pancreas, unspecified: Secondary | ICD-10-CM | POA: Diagnosis not present

## 2015-08-15 NOTE — Telephone Encounter (Signed)
Noted. Any idea why it's taking so long to be referred to [facility]?

## 2015-08-15 NOTE — Telephone Encounter (Signed)
Tried to call pt on cell also and could not leave a Vm.

## 2015-08-15 NOTE — Telephone Encounter (Signed)
[phone] PT WIFE, SHIRLEY, CALLED AND STATED THAT William Rivera IS VERY SICK AND HAS BAD PAIN IN HIS STOMACH.  HAS HAD EPISODES OF VOMITING AS WELL.  WANTS TO KNOW WHAT SHE SHOULD DO.  PLEASE ADVISE.  HE HAS BEEN TO THE ER.

## 2015-08-15 NOTE — Telephone Encounter (Signed)
Tried to call pt and LMOM for a return call.  Routing to Ginger to put in the orders.

## 2015-08-15 NOTE — Telephone Encounter (Signed)
I called pt and he said he went to the ED on 08/11/2015 for the abdominal pain and they changed his pain med to Dilaudid . It helped for a couple of days, but now it is making him sick.   I spoke to Walden Field, NP, who last saw him in the office. He said to have him contact his cancer doctor since he cannot prescribe those medications. ( Pt has been trying to get referred to [facility] but that has not happened yet. )   I called pt to tell him to contact his cancer doctor and he said he would call them, but they will not return his call. I told him if he started hurting too bad to go to the ED if he felt he had to have something for the pain.  Sending FYI to Dr. Oneida Alar.

## 2015-08-15 NOTE — Telephone Encounter (Signed)
PLEASE CALL PT'S WIFE. HE SHOULD STOP TAKING THE DILAUDID. TODAY HE NEEDS A CT ABD W/ ORAL CONRAST ONLY TO LOOK FOR GASTRIC OUTLET OBSTRUCTION. THE STUDY DONE 4 DAYS AGO WAS WITHOUT ORAL CONTRAST. HE SHOULD CONTINUE ZOFRAN AT 8A 12N 4P AND 8P AND AT BEDTIME. HE SHOULD RESUME OXYCODONE TO CONTROL PAIN. IF HE FEELS HE CAN'T MAKE IT AT HOME, HE SHOULD RETURN TO THE ED AT [facility].

## 2015-08-15 NOTE — Telephone Encounter (Signed)
Called and [facility] for a return call to discuss what is going on with him being referred to [facility].

## 2015-08-15 NOTE — Telephone Encounter (Signed)
Orders are in and he can go to hospital now but we are unable to get in tough with him

## 2015-08-16 NOTE — Telephone Encounter (Signed)
PT has been admitted at [facility].

## 2015-08-21 NOTE — Telephone Encounter (Signed)
REVIEWED-NO ADDITIONAL RECOMMENDATIONS. 

## 2015-08-23 DIAGNOSIS — I471 Supraventricular tachycardia: Secondary | ICD-10-CM | POA: Diagnosis not present

## 2015-08-23 DIAGNOSIS — D72829 Elevated white blood cell count, unspecified: Secondary | ICD-10-CM | POA: Diagnosis not present

## 2015-08-23 DIAGNOSIS — I482 Chronic atrial fibrillation: Secondary | ICD-10-CM | POA: Diagnosis not present

## 2015-08-23 DIAGNOSIS — N179 Acute kidney failure, unspecified: Secondary | ICD-10-CM | POA: Diagnosis not present

## 2015-08-23 DIAGNOSIS — Z881 Allergy status to other antibiotic agents status: Secondary | ICD-10-CM | POA: Diagnosis not present

## 2015-08-23 DIAGNOSIS — I499 Cardiac arrhythmia, unspecified: Secondary | ICD-10-CM | POA: Diagnosis not present

## 2015-08-23 DIAGNOSIS — D649 Anemia, unspecified: Secondary | ICD-10-CM | POA: Diagnosis not present

## 2015-08-23 DIAGNOSIS — Z886 Allergy status to analgesic agent status: Secondary | ICD-10-CM | POA: Diagnosis not present

## 2015-08-24 ENCOUNTER — Encounter (HOSPITAL_COMMUNITY): Payer: Self-pay | Admitting: Hematology & Oncology

## 2015-08-24 ENCOUNTER — Encounter (HOSPITAL_COMMUNITY): Payer: Medicare Other | Attending: Hematology & Oncology | Admitting: Hematology & Oncology

## 2015-08-24 ENCOUNTER — Encounter (HOSPITAL_COMMUNITY): Payer: Self-pay | Admitting: Lab

## 2015-08-24 VITALS — BP 101/55 | HR 90 | Resp 16 | Ht 69.0 in | Wt 165.0 lb

## 2015-08-24 DIAGNOSIS — G893 Neoplasm related pain (acute) (chronic): Secondary | ICD-10-CM

## 2015-08-24 DIAGNOSIS — C7801 Secondary malignant neoplasm of right lung: Secondary | ICD-10-CM | POA: Diagnosis not present

## 2015-08-24 DIAGNOSIS — C259 Malignant neoplasm of pancreas, unspecified: Secondary | ICD-10-CM

## 2015-08-24 DIAGNOSIS — C250 Malignant neoplasm of head of pancreas: Secondary | ICD-10-CM | POA: Diagnosis not present

## 2015-08-24 DIAGNOSIS — Z7189 Other specified counseling: Secondary | ICD-10-CM

## 2015-08-24 MED ORDER — OXYCODONE HCL 10 MG PO TABS: 10.0000 mg | ORAL_TABLET | ORAL | 0 refills | Status: AC | PRN

## 2015-08-24 NOTE — Progress Notes (Unsigned)
Referral sent to [facility].  Records faxed on 8/3.  They will contact patient.

## 2015-08-24 NOTE — Patient Instructions (Signed)
[place] at [facility] Discharge Instructions  RECOMMENDATIONS MADE BY THE CONSULTANT AND ANY TEST RESULTS WILL BE SENT TO YOUR REFERRING PHYSICIAN.  Exam done and seen today by Dr. [facility] should call you to set up home visit. Return to see the doctor in 6 weeks Will give you constipation sheet. Please call the clinic if you have any questions or concerns  Thank you for choosing [place] at [facility] to provide your oncology and hematology care.  To afford each patient quality time with our provider, please arrive at least 15 minutes before your scheduled appointment time.   Beginning January 23rd 2017 lab work for the [name] will be done in the  Main lab at [facility] on 1st floor. If you have a lab appointment with the [facility] please come in thru the  Main Entrance and check in at the main information desk  You need to re-schedule your appointment should you arrive 10 or more minutes late.  We strive to give you quality time with our providers, and arriving late affects you and other patients whose appointments are after yours.  Also, if you no show three or more times for appointments you may be dismissed from the clinic at the providers discretion.     Again, thank you for choosing [facility].  Our hope is that these requests will decrease the amount of time that you wait before being seen by our physicians.       _____________________________________________________________  Should you have questions after your visit to [facility], please contact our office at [phone] between the hours of 8:30 a.m. and 4:30 p.m.  Voicemails left after 4:30 p.m. will not be returned until the following business day.  For prescription refill requests, have your pharmacy contact our office.         Resources For Cancer Patients and their Caregivers ? American Cancer Society: Can assist with  transportation, wigs, general needs, runs Look Good Feel Better.        (951) 132-8005 ? Cancer Care: Provides financial assistance, online support groups, medication/co-pay assistance.  1-800-813-HOPE 732-850-9303) ? [facility] Assists [place] cancer patients and their families through emotional , educational and financial support.  303-041-3285 ? [place] DSS Where to apply for food stamps, Medicaid and utility assistance. 902 264 8642 ? [facility]: Transportation to medical appointments. 757-269-6270 ? Social Security Administration: May apply for disability if have a Stage IV cancer. 8300975748 606-384-8767 ? [employer], Disability and Transit Services: Assists with nutrition, care and transit needs. [place] Support Programs: @10RELATIVEDAYS @ > Cancer Support Group  2nd Tuesday of the month 1pm-2pm, Journey Room  > Creative Journey  3rd Tuesday of the month 1130am-1pm, Journey Room  > Look Good Feel Better  1st Wednesday of the month 10am-12 noon, Journey Room (Call [place] to register [phone])

## 2015-08-24 NOTE — Progress Notes (Signed)
[place]  CONSULT NOTE  Patient Care Team: Sharilyn Sites, MD as PCP - General Danie Binder, MD (Gastroenterology)  CHIEF COMPLAINTS/PURPOSE OF CONSULTATION:  Pancreatic cancer; chemotherapy at [facility] with Dr. Rushie Nyhan and radiation in [place] with Dr. Pablo Ledger Upper endoscopy on 06/02/15   Oncology History   Stage IV Pancreatic cancer     Adenocarcinoma of pancreas ([place])   09/05/2014 Initial Diagnosis    Adenocarcinoma of pancreas ([place])     10/20/2014 -  Chemotherapy    Gemzar/Abraxane X 2 cycles, discontinued secondary to biliary obstruction       01/18/2015 - 02/28/2015 Radiation Therapy    XRT with Dr. Pablo Ledger in [place], concurrent XELODA x 5 days discontinued secondary to intolerance, completed XRT      03/01/2015 Procedure    Biliary stent placement      05/01/2015 -  Chemotherapy    5-FU/Onyvide X 1 cycle. Discontinued due to intolerance       05/11/2015 Initial Biopsy    Biopsy proven lung metastases      06/01/2015 Imaging    CT A/P [place] with no significant change in pancreatic head mass surrounding internal biliary stent c/w known pancreatic carcinoma. Stable subcm peripancreatic LN in the porta hepatis and gastrohepatic ligament. Stable diffuse tiny sub-cm bibasilar pulmonary nodules, c/w pulmonary metastases       HISTORY OF PRESENTING ILLNESS:  William Rivera 80 y.o. male is here because of stage IV pancreatic cancer. The majority of his care has been at [facility].  He has been intolerant to chemotherapy and has tried multiple therapies.   Mr. Vanlenten is accompanied by his wife.  He received chemotherapy before he received radiation treatment. He received chemotherapy at [facility]. His last treatment was in February or March and we wore a pump. This pump caused him to hurt more. He went back and told them he couldn't take it anymore. Patient states if there was a medication that was quit, it was because it bothered him.   He did not do well with  the chemotherapy that was tried. States, "That last radiation treatment did me in".   Patient states he has pancreatic cancer and something in his lungs. He came to the [facility] today because he would like to continue treatment close to home or know what else he can do. He does not think he can do any more chemotherapy.   He sees Dr. Hilma Favors for PCP. He also sees Dr. Trinda Pascal.   He takes oxycodone. One time when he took it he experienced a lot of itching. He is now able to take it without issue. The oxycodone helps with pain in his upper right side. "I don't take the oxycodone much". He was given 30 for 15 days. He isn't sure if he is constipated from the pain medication. He has not moved his bowels in 3 days, admitting he doesn't eat much. His bowels are irregular.  He makes himself eat. After chemotherapy, beef and pork do not taste good. He has tried and doesn't like Boost or Ensure. He has never tried the clear Boost/Ensure.  "I know it's going to do me in, but I'd like to just not hurt". He would like to have refills and further surveillance of his disease done closer to home. He notes he just wants to feel as good as he can for as long as he can.   States, "If I can get this A fib to go away,  I'll be fine". This was found recently.  He has spoken with his wife about end of life decisions. He does not want to be shocked or have a breathing tube placed. His wife admits it hurts to talk about these decisions.   The patient is here to additional discussion regarding his stage IV pancreatic cancer diagnosis.    MEDICAL HISTORY:  Past Medical History:  Diagnosis Date  . Adenomatous colon polyp 2010  . Barrett's esophagus    last EGD/Bx 12/11  . Cancer of pancreas, other site AUG 2016   CA 19-9 958.6  . GASTRITIS 09/23/2008   Qualifier: Diagnosis of  By: Nicole Kindred LPN, Doris    . GERD (gastroesophageal reflux disease)   . Gout   . Helicobacter pylori gastritis 2000   s/p treatment   . Hypertension   . Pancreatic cancer ([place])   . Pancreatic cancer [facility])     SURGICAL HISTORY: Past Surgical History:  Procedure Laterality Date  . BILIARY STENT PLACEMENT N/A 09/14/2014   Procedure: BILIARY STENT PLACEMENT;  Surgeon: Rogene Houston, MD;  Location: [facility] ORS;  Service: Endoscopy;  Laterality: N/A;  . BLADDER SURGERY  [id number]  . COLONOSCOPY  2010   simple adenomas  . COLONOSCOPY N/A 07/28/2013   Dr. Oneida Alar: tubular adenoma, mild sigmoid colon diverticulosis, internal hemorhhoids   . DOPPLER ECHOCARDIOGRAPHY  2009  . ERCP N/A 09/14/2014   Dr. Laural Golden: sphincterotomy with 39 F plastic biliary stent placement   . ERCP N/A 01/04/2015   Procedure: ATTEMPTED ENDOSCOPIC RETROGRADE CHOLANGIOPANCREATOGRAPHY (ERCP);  Surgeon: Daneil Dolin, MD;  Location: [facility] ORS;  Service: Endoscopy;  Laterality: N/A;  . ERCP  02/2015   [facility]: biliary stent exchange from plastic to metal stent  . ESOPHAGOGASTRODUODENOSCOPY  2008   DUODENITIS & GASTRITIS 2o TO NSAIDS/ETOH  . ESOPHAGOGASTRODUODENOSCOPY  12/2009   short segment Barrett's, small hh, chronic gastritis  . ESOPHAGOGASTRODUODENOSCOPY  01/04/2011   [id number] gastritis/Barrett's, possible  . ESOPHAGOGASTRODUODENOSCOPY N/A 11/30/2012   Dr. Oneida Alar- normal esophagus, empiric dilation d/t c/o dysphagia/?cervical web, stomach= mild non erosive gastritis, inflammation on bx, duodenum= no abnormalities in the bulb and second portion of the duodenum. dilation at the gastroesphageal junction.  . ESOPHAGOGASTRODUODENOSCOPY N/A 06/02/2015   SLF: 1. normal esophagus 2. gastrtitis mild 3. GI bleed due to large duodenal ulcer with large visible vessel, high risk to rebleed without prevention   . ESOPHAGOGASTRODUODENOSCOPY (EGD) WITH PROPOFOL N/A 01/04/2015   RMR: partial uoddenal obstruction at junction of 1st and 2nd portion of duodenum, unale to intubate 2nd portion of duodenum to perform ERCP  . HEMORRHOID SURGERY    . MALONEY DILATION N/A 11/30/2012    Procedure: MALONEY DILATION;  Surgeon: Danie Binder, MD;  Location: [facility] ENDO SUITE;  Service: Endoscopy;  Laterality: N/A;  . NASAL SEPTUM SURGERY    . NECK SURGERY     DISC REPLACED  . NM MYOVIEW LTD  2009  . REPLACEMENT TOTAL KNEE Right 11/2010  . SAVORY DILATION N/A 11/30/2012   Procedure: SAVORY DILATION;  Surgeon: Danie Binder, MD;  Location: [facility] ENDO SUITE;  Service: Endoscopy;  Laterality: N/A;  . SINUS EXPLORATION  2000  . SPHINCTEROTOMY N/A 09/14/2014   Procedure: SPHINCTEROTOMY;  Surgeon: Rogene Houston, MD;  Location: [facility] ORS;  Service: Endoscopy;  Laterality: N/A;  . stents in bile ducts    . TONSILLECTOMY     AS A CHILD    SOCIAL HISTORY: Social History   Social History  . Marital status:  Married    Spouse name: N/A  . Number of children: 1  . Years of education: N/A   Occupational History  . truck driver Retired   Social History Main Topics  . Smoking status: Never Smoker  . Smokeless tobacco: Never Used     Comment: Never smoked much  . Alcohol use No     Comment: former drinks about a pint of borboun per week  . Drug use: No  . Sexual activity: Yes    Partners: Female    Birth control/ protection: None   Other Topics Concern  . Not on file   Social History Narrative  . No narrative on file   Married 92 years 1 son 2 grandsons 1 great grandson Worked as a Forensic scientist He used to fish and play baseball Live in [place]: Family History  Problem Relation Age of Onset  . Stomach cancer Father 60    deceased  . Colon cancer Neg Hx    Mother deceased at 97 yo of old age Father deceased at 73 yo of stomach cancer 2 brothers.   ALLERGIES:  is allergic to doxycycline; colestipol; and doxycycline.  MEDICATIONS:  Current Outpatient Prescriptions  Medication Sig Dispense Refill  . Artificial Tear Ointment (DRY EYES OP) Apply 1-2 drops to eye daily as needed (dry eyes).    . CREON 24000 units CPEP TK 2 CS PO WITH  MEALS AND 1 C WITH SNACKS  1  . erythromycin ophthalmic ointment APP 1 CENTIMETER TO THE RIGHT EYE ONCE D AT BEDTIME  0  . feeding supplement (BOOST / RESOURCE BREEZE) LIQD Take 1 Container by mouth 3 (three) times daily with meals. 90 Container 0  . fentaNYL (DURAGESIC - DOSED MCG/HR) 50 MCG/HR APPLY AND CHANGE ONE PATCH TOPICALLY Q 3RD DAY  0  . Ferrous Sulfate 220 (44 Fe) MG/5ML LIQD   3  . GAS RELIEF 125 MG CAPS TK 2 SOFTGELS  PO QID  0  . HYDROcodone-acetaminophen (NORCO) 10-325 MG tablet Take 1 tablet by mouth every 6 (six) hours as needed.    Marland Kitchen HYDROcodone-acetaminophen (NORCO/VICODIN) 5-325 MG tablet TAKE 1 T PO EVERY 6 HOURS AS NEEDED  0  . HYDROmorphone (DILAUDID) 4 MG tablet Take 1 tablet (4 mg total) by mouth every 6 (six) hours as needed for severe pain. 56 tablet 0  . ondansetron (ZOFRAN) 8 MG tablet Take 8 mg by mouth every 8 (eight) hours as needed for nausea or vomiting.     . Oxycodone HCl 10 MG TABS Take 1 tablet (10 mg total) by mouth every 6 (six) hours as needed. (Patient not taking: Reported on 08/11/2015) 90 tablet 0  . pantoprazole (PROTONIX) 40 MG tablet Take 1 tablet (40 mg total) by mouth 2 (two) times daily. 80 tablet 1  . simethicone (MYLICON) 0000000 MG chewable tablet Chew 125 mg by mouth every 6 (six) hours as needed for flatulence. Reported on 07/19/2015    . sucralfate (CARAFATE) 1 g tablet Take 1 g by mouth 3 (three) times daily.     No current facility-administered medications for this visit.     Review of Systems  Constitutional: Positive for weight loss (65 lbs since August 2016).  HENT: Negative.   Eyes: Negative.   Respiratory: Negative.   Cardiovascular: Negative.   Gastrointestinal: Positive for abdominal pain and constipation.       Pain managed with oxycodone  Genitourinary: Negative.   Musculoskeletal: Negative.   Skin:  Negative.   Neurological: Negative.   Endo/Heme/Allergies: Negative.   Psychiatric/Behavioral: Negative.   All other systems  reviewed and are negative. 14 point ROS was done and is otherwise as detailed above or in HPI   PHYSICAL EXAMINATION: ECOG PERFORMANCE STATUS: 1 - Symptomatic but completely ambulatory  Vitals:   08/24/15 1512  BP: (!) 101/55  Pulse: 90  Resp: 16   Filed Weights   08/24/15 1512  Weight: 165 lb (74.8 kg)    Physical Exam  Constitutional: He is oriented to person, place, and time and well-developed, well-nourished, and in no distress.  thin  HENT:  Head: Normocephalic and atraumatic.  Mouth/Throat: Oropharynx is clear and moist.  Eyes: Conjunctivae and EOM are normal. Pupils are equal, round, and reactive to light. Right eye exhibits no discharge. Left eye exhibits no discharge. No scleral icterus.  Neck: Normal range of motion. Neck supple. No thyromegaly present.  Cardiovascular: Normal rate, regular rhythm and normal heart sounds.   Pulmonary/Chest: Effort normal and breath sounds normal. No respiratory distress. He has no wheezes. He has no rales. He exhibits no tenderness.  Abdominal: Soft. Bowel sounds are normal. He exhibits no distension. There is tenderness. There is no rebound.  Musculoskeletal: Normal range of motion. He exhibits no edema.  Lymphadenopathy:    He has no cervical adenopathy.  Neurological: He is alert and oriented to person, place, and time. No cranial nerve deficit. Gait normal. Coordination normal.  Skin: Skin is warm and dry.  Psychiatric: Mood, memory, affect and judgment normal.  Nursing note and vitals reviewed.   LABORATORY DATA:  I have reviewed the data as listed Lab Results  Component Value Date   WBC 8.4 08/11/2015   HGB 12.3 (L) 08/11/2015   HCT 35.2 (L) 08/11/2015   MCV 90.3 08/11/2015   PLT 219 08/11/2015   CMP     Component Value Date/Time   NA 137 08/11/2015 0714   K 4.1 08/11/2015 0714   CL 107 08/11/2015 0714   CO2 26 08/11/2015 0714   GLUCOSE 153 (H) 08/11/2015 0714   BUN 27 (H) 08/11/2015 0714   CREATININE 1.51 (H)  08/11/2015 0714   CREATININE 1.73 (H) 09/13/2014 0924   CALCIUM 9.3 08/11/2015 0714   PROT 7.6 08/11/2015 0714   ALBUMIN 3.8 08/11/2015 0714   AST 26 08/11/2015 0714   ALT 30 08/11/2015 0714   ALKPHOS 126 08/11/2015 0714   BILITOT 0.4 08/11/2015 0714   GFRNONAA 43 (L) 08/11/2015 0714   GFRNONAA 38 (L) 09/13/2014 0924   GFRAA 50 (L) 08/11/2015 0714   GFRAA 43 (L) 09/13/2014 [id number]     RADIOGRAPHIC STUDIES: I have personally reviewed the radiological images as listed and agreed with the findings in the report. No results found. Study Result   CLINICAL DATA:  Pancreatic cancer.  EXAM: CT ABDOMEN AND PELVIS WITHOUT CONTRAST  TECHNIQUE: Multidetector CT imaging of the abdomen and pelvis was performed following the standard protocol without IV contrast.  COMPARISON:  06/01/2015  FINDINGS: Lower chest: Innumerable bilateral pulmonary nodules are again noted. They are more conspicuous and appear slightly progressed in the interval. 4 mm posterior right lower lobe pulmonary nodule seen on previous study is now 6 mm. 9 mm subpleural nodule measured on the previous study is now 15 mm.  Hepatobiliary: Pneumobilia again noted in the left hepatic lobe. No change in the probable cyst along the gallbladder fossa. Gallbladder unremarkable. Common bile duct stent remains in place.  Pancreas: Discrete pancreatic head mass  not apparent on this study without intravenous contrast and measurements on previous study included the duodenum. Overall, imaging appearance at the level of the pancreatic head does not appear substantially changed. Patient is noted to have new embolization coils in the gastroduodenal artery.  Spleen: No splenomegaly. No focal mass lesion.  Adrenals/Urinary Tract: No adrenal nodule or mass. Lymph kidney is markedly scarred and atrophic. Cortical scarring is seen in the right kidney. No hydronephrosis on either side. No evidence for hydroureter. The urinary  bladder appears normal for the degree of distention.  Stomach/Bowel: Stomach is mildly distended. Duodenum is nondistended. No small bowel wall thickening. No small bowel dilatation. The terminal ileum is normal. The appendix is normal. No gross colonic mass. No colonic wall thickening. No substantial diverticular change.  Vascular/Lymphatic: There is abdominal aortic atherosclerosis without aneurysm. Haziness/ edema in the root of the small bowel mesentery is stable to slightly progressed in the interval. No bulky abdominal lymphadenopathy. No pelvic sidewall lymphadenopathy.  Reproductive: The prostate gland and seminal vesicles have normal imaging features.  Other: No intraperitoneal free fluid.  Musculoskeletal: Bone windows reveal no worrisome lytic or sclerotic osseous lesions.  IMPRESSION: 1. No substantial change in pancreatic head mass. 2. Innumerable tiny bilateral pulmonary nodules appear progressed in the interval and some of the larger, more discrete nodules measured previously have progressed. 3. Interval embolization of the gastroduodenal artery.   Electronically Signed   By: Misty Stanley M.D.   On: 08/11/2015 09:41   PATHOLOGY   ASSESSMENT & PLAN: Stage IV Pancreatic cancer; chemotherapy at [facility] with Dr. Rushie Nyhan and radiation in [place] with Dr. Pablo Ledger Pulmonary metastases Abdominal pain Intolerance to multiple lines of chemotherapy End of Life discussion Goals of Care Discussion  We had a long discussion regarding his goals. He is adamant that he does not want anymore chemotherapy. He has tried Gemzar, abraxane, xeloda, onyvide and discontinued all of them.  His primary concern is to not have pain. We discussed his current pain regimen which does not provide consistent pain relief. His pain medication is constipating him.   The patient and his wife speak about end of life decisions. The patient does not want to resuscitated. The patient is  agreeable to meeting with Hospice. He is a perfect patient for hospice referral. Hospice care is in line with his goals.  I have made a referral to [place].   I have written him a prescription for oxycodone.  He was given a constipation care sheet. He was also given clear Boost/Ensure samples.  He will be scheduled for tentative follow up in 6 weeks.    ORDERS PLACED FOR THIS ENCOUNTER: No orders of the defined types were placed in this encounter.   MEDICATIONS PRESCRIBED THIS ENCOUNTER: Meds ordered this encounter  Medications  . erythromycin ophthalmic ointment    Sig: APP 1 CENTIMETER TO THE RIGHT EYE ONCE D AT BEDTIME    Refill:  0  . fentaNYL (DURAGESIC - DOSED MCG/HR) 50 MCG/HR    Sig: APPLY AND CHANGE ONE PATCH TOPICALLY Q 3RD DAY    Refill:  0  . Ferrous Sulfate 220 (44 Fe) MG/5ML LIQD    Refill:  3  . HYDROcodone-acetaminophen (NORCO/VICODIN) 5-325 MG tablet    Sig: TAKE 1 T PO EVERY 6 HOURS AS NEEDED    Refill:  0  . CREON 24000 units CPEP    Sig: TK 2 CS PO WITH MEALS AND 1 C WITH SNACKS    Refill:  1  . GAS RELIEF 125 MG CAPS    Sig: TK 2 SOFTGELS  PO QID    Refill:  0     All questions were answered. The patient knows to call the clinic with any problems, questions or concerns.  This document serves as a record of services personally performed by Ancil Linsey, MD. It was created on her behalf by Arlyce Harman, a trained medical scribe. The creation of this record is based on the scribe's personal observations and the provider's statements to them. This document has been checked and approved by the attending provider.  I have reviewed the above documentation for accuracy and completeness, and I agree with the above.  This note was electronically signed.    Molli Hazard, MD  08/24/2015 3:39 PM

## 2015-08-25 DIAGNOSIS — E860 Dehydration: Secondary | ICD-10-CM | POA: Diagnosis not present

## 2015-08-25 DIAGNOSIS — R0789 Other chest pain: Secondary | ICD-10-CM | POA: Diagnosis not present

## 2015-08-25 DIAGNOSIS — I959 Hypotension, unspecified: Secondary | ICD-10-CM | POA: Diagnosis not present

## 2015-08-25 DIAGNOSIS — R0609 Other forms of dyspnea: Secondary | ICD-10-CM | POA: Diagnosis not present

## 2015-08-28 ENCOUNTER — Telehealth: Payer: Self-pay

## 2015-08-28 DIAGNOSIS — I10 Essential (primary) hypertension: Secondary | ICD-10-CM | POA: Diagnosis not present

## 2015-08-28 DIAGNOSIS — R531 Weakness: Secondary | ICD-10-CM | POA: Diagnosis not present

## 2015-08-28 DIAGNOSIS — K219 Gastro-esophageal reflux disease without esophagitis: Secondary | ICD-10-CM | POA: Diagnosis not present

## 2015-08-28 DIAGNOSIS — C259 Malignant neoplasm of pancreas, unspecified: Secondary | ICD-10-CM | POA: Diagnosis not present

## 2015-08-28 DIAGNOSIS — K2901 Acute gastritis with bleeding: Secondary | ICD-10-CM | POA: Diagnosis not present

## 2015-08-28 DIAGNOSIS — I4891 Unspecified atrial fibrillation: Secondary | ICD-10-CM | POA: Diagnosis not present

## 2015-08-28 DIAGNOSIS — R52 Pain, unspecified: Secondary | ICD-10-CM | POA: Diagnosis not present

## 2015-08-28 DIAGNOSIS — M109 Gout, unspecified: Secondary | ICD-10-CM | POA: Diagnosis not present

## 2015-08-28 MED ORDER — HYDROCORTISONE 2.5 % RE CREA: 1.0000 "application " | TOPICAL_CREAM | Freq: Two times a day (BID) | RECTAL | 1 refills | Status: DC

## 2015-08-28 NOTE — Telephone Encounter (Signed)
PT called and said he is passing more blood. It was in his stool and comode once yesterday and 3 times today.  Hospice is supposed to be taking over, but he said they want to find out about this rectal bleeding first.  He is finished at [facility] and will be seeing Kirby Crigler at [facility] on 10/08/2015.  Sending this note to Walden Field, NP who saw pt in the office last. Dr. Oneida Alar is off today.

## 2015-08-28 NOTE — Telephone Encounter (Signed)
I will send in anusol cream. If he has large amounts of rectal bleeding or black tarry stool, would have concern for rapid transit upper GI bleeding (due to his history) and would need to seek medical attention. Sounds like small volume currently and will manage with supportive care for now. If any changes, seek medical attention.

## 2015-08-28 NOTE — Telephone Encounter (Signed)
PT is aware.

## 2015-08-29 ENCOUNTER — Inpatient Hospital Stay (HOSPITAL_COMMUNITY)
Admission: EM | Admit: 2015-08-29 | Discharge: 2015-09-02 | DRG: 982 | Disposition: A | Discharge status: Home / Self Care | Payer: Medicare Other | Attending: Internal Medicine | Admitting: Internal Medicine

## 2015-08-29 ENCOUNTER — Encounter (HOSPITAL_COMMUNITY): Payer: Self-pay | Admitting: Emergency Medicine

## 2015-08-29 ENCOUNTER — Emergency Department (HOSPITAL_COMMUNITY): Payer: Medicare Other

## 2015-08-29 DIAGNOSIS — K264 Chronic or unspecified duodenal ulcer with hemorrhage: Principal | ICD-10-CM

## 2015-08-29 DIAGNOSIS — I10 Essential (primary) hypertension: Secondary | ICD-10-CM | POA: Diagnosis present

## 2015-08-29 DIAGNOSIS — N179 Acute kidney failure, unspecified: Secondary | ICD-10-CM | POA: Diagnosis not present

## 2015-08-29 DIAGNOSIS — K922 Gastrointestinal hemorrhage, unspecified: Secondary | ICD-10-CM | POA: Diagnosis not present

## 2015-08-29 DIAGNOSIS — Z96651 Presence of right artificial knee joint: Secondary | ICD-10-CM | POA: Diagnosis present

## 2015-08-29 DIAGNOSIS — K2901 Acute gastritis with bleeding: Secondary | ICD-10-CM | POA: Diagnosis not present

## 2015-08-29 DIAGNOSIS — R0902 Hypoxemia: Secondary | ICD-10-CM | POA: Diagnosis not present

## 2015-08-29 DIAGNOSIS — I4891 Unspecified atrial fibrillation: Secondary | ICD-10-CM | POA: Diagnosis not present

## 2015-08-29 DIAGNOSIS — D62 Acute posthemorrhagic anemia: Secondary | ICD-10-CM | POA: Diagnosis present

## 2015-08-29 DIAGNOSIS — E441 Mild protein-calorie malnutrition: Secondary | ICD-10-CM | POA: Diagnosis not present

## 2015-08-29 DIAGNOSIS — C7800 Secondary malignant neoplasm of unspecified lung: Secondary | ICD-10-CM | POA: Diagnosis present

## 2015-08-29 DIAGNOSIS — K2270 Barrett's esophagus without dysplasia: Secondary | ICD-10-CM | POA: Diagnosis present

## 2015-08-29 DIAGNOSIS — Z743 Need for continuous supervision: Secondary | ICD-10-CM | POA: Diagnosis not present

## 2015-08-29 DIAGNOSIS — Z881 Allergy status to other antibiotic agents status: Secondary | ICD-10-CM | POA: Diagnosis not present

## 2015-08-29 DIAGNOSIS — Z66 Do not resuscitate: Secondary | ICD-10-CM | POA: Diagnosis present

## 2015-08-29 DIAGNOSIS — D649 Anemia, unspecified: Secondary | ICD-10-CM

## 2015-08-29 DIAGNOSIS — Z9221 Personal history of antineoplastic chemotherapy: Secondary | ICD-10-CM | POA: Diagnosis not present

## 2015-08-29 DIAGNOSIS — K219 Gastro-esophageal reflux disease without esophagitis: Secondary | ICD-10-CM | POA: Diagnosis present

## 2015-08-29 DIAGNOSIS — Z79899 Other long term (current) drug therapy: Secondary | ICD-10-CM | POA: Diagnosis not present

## 2015-08-29 DIAGNOSIS — Z800 Family history of malignant neoplasm of digestive organs: Secondary | ICD-10-CM

## 2015-08-29 DIAGNOSIS — R0602 Shortness of breath: Secondary | ICD-10-CM | POA: Diagnosis not present

## 2015-08-29 DIAGNOSIS — C259 Malignant neoplasm of pancreas, unspecified: Secondary | ICD-10-CM

## 2015-08-29 DIAGNOSIS — K921 Melena: Secondary | ICD-10-CM | POA: Diagnosis not present

## 2015-08-29 DIAGNOSIS — M109 Gout, unspecified: Secondary | ICD-10-CM | POA: Diagnosis present

## 2015-08-29 DIAGNOSIS — I471 Supraventricular tachycardia: Secondary | ICD-10-CM | POA: Diagnosis not present

## 2015-08-29 DIAGNOSIS — Z1389 Encounter for screening for other disorder: Secondary | ICD-10-CM | POA: Diagnosis not present

## 2015-08-29 DIAGNOSIS — R279 Unspecified lack of coordination: Secondary | ICD-10-CM | POA: Diagnosis not present

## 2015-08-29 DIAGNOSIS — Z923 Personal history of irradiation: Secondary | ICD-10-CM | POA: Diagnosis not present

## 2015-08-29 DIAGNOSIS — Z6824 Body mass index (BMI) 24.0-24.9, adult: Secondary | ICD-10-CM | POA: Diagnosis not present

## 2015-08-29 DIAGNOSIS — Z888 Allergy status to other drugs, medicaments and biological substances status: Secondary | ICD-10-CM

## 2015-08-29 HISTORY — DX: Supraventricular tachycardia: I47.1

## 2015-08-29 LAB — COMPREHENSIVE METABOLIC PANEL
ALBUMIN: 2.4 g/dL — AB (ref 3.5–5.0)
ALK PHOS: 103 U/L (ref 38–126)
ALT: 32 U/L (ref 17–63)
AST: 51 U/L — AB (ref 15–41)
Anion gap: 5 (ref 5–15)
BUN: 23 mg/dL — ABNORMAL HIGH (ref 6–20)
CALCIUM: 7.9 mg/dL — AB (ref 8.9–10.3)
CHLORIDE: 109 mmol/L (ref 101–111)
CO2: 20 mmol/L — AB (ref 22–32)
CREATININE: 2.22 mg/dL — AB (ref 0.61–1.24)
GFR calc Af Amer: 31 mL/min — ABNORMAL LOW (ref >60)
GFR calc non Af Amer: 27 mL/min — ABNORMAL LOW (ref >60)
GLUCOSE: 121 mg/dL — AB (ref 65–99)
Potassium: 3.9 mmol/L (ref 3.5–5.1)
SODIUM: 134 mmol/L — AB (ref 135–145)
Total Bilirubin: 0.4 mg/dL (ref 0.3–1.2)
Total Protein: 6.3 g/dL — ABNORMAL LOW (ref 6.5–8.1)

## 2015-08-29 LAB — CBC WITH DIFFERENTIAL/PLATELET
BASOS PCT: 0 %
Basophils Absolute: 0 10*3/uL (ref 0.0–0.1)
EOS ABS: 0.1 10*3/uL (ref 0.0–0.7)
Eosinophils Relative: 0 %
HEMATOCRIT: 23.1 % — AB (ref 39.0–52.0)
Hemoglobin: 7.7 g/dL — ABNORMAL LOW (ref 13.0–17.0)
LYMPHS ABS: 1.1 10*3/uL (ref 0.7–4.0)
Lymphocytes Relative: 8 %
MCH: 30.8 pg (ref 26.0–34.0)
MCHC: 33.3 g/dL (ref 30.0–36.0)
MCV: 92.4 fL (ref 78.0–100.0)
MONO ABS: 1.3 10*3/uL — AB (ref 0.1–1.0)
MONOS PCT: 9 %
Neutro Abs: 11.6 10*3/uL — ABNORMAL HIGH (ref 1.7–7.7)
Neutrophils Relative %: 82 %
Platelets: 516 10*3/uL — ABNORMAL HIGH (ref 150–400)
RBC: 2.5 MIL/uL — ABNORMAL LOW (ref 4.22–5.81)
RDW: 14.9 % (ref 11.5–15.5)
WBC: 13.6 10*3/uL — ABNORMAL HIGH (ref 4.0–10.5)

## 2015-08-29 LAB — PROTIME-INR
INR: 1.31
Prothrombin Time: 16.4 seconds — ABNORMAL HIGH (ref 11.4–15.2)

## 2015-08-29 LAB — LIPASE, BLOOD: Lipase: 25 U/L (ref 11–51)

## 2015-08-29 LAB — POC OCCULT BLOOD, ED: FECAL OCCULT BLD: POSITIVE — AB

## 2015-08-29 LAB — LACTIC ACID, PLASMA: LACTIC ACID, VENOUS: 2.2 mmol/L — AB (ref 0.5–1.9)

## 2015-08-29 LAB — PREPARE RBC (CROSSMATCH)

## 2015-08-29 MED ORDER — MORPHINE SULFATE (PF) 2 MG/ML IV SOLN
2.0000 mg | INTRAVENOUS | Status: DC | PRN
  Administered 2015-08-29 – 2015-08-30 (×3): 2 mg via INTRAVENOUS
  Filled 2015-08-29 (×3): qty 1

## 2015-08-29 MED ORDER — ONDANSETRON HCL 4 MG PO TABS: 4.0000 mg | ORAL_TABLET | Freq: Four times a day (QID) | ORAL | Status: DC | PRN

## 2015-08-29 MED ORDER — MORPHINE SULFATE (PF) 4 MG/ML IV SOLN
4.0000 mg | Freq: Once | INTRAVENOUS | Status: AC
  Administered 2015-08-29: 4 mg via INTRAVENOUS
  Filled 2015-08-29: qty 1

## 2015-08-29 MED ORDER — SODIUM CHLORIDE 0.9 % IV SOLN: Freq: Once | INTRAVENOUS | Status: DC

## 2015-08-29 MED ORDER — ONDANSETRON HCL 4 MG/2ML IJ SOLN: 4.0000 mg | Freq: Four times a day (QID) | INTRAMUSCULAR | Status: DC | PRN

## 2015-08-29 MED ORDER — SODIUM CHLORIDE 0.9% FLUSH
3.0000 mL | Freq: Two times a day (BID) | INTRAVENOUS | Status: DC
  Administered 2015-08-29 – 2015-08-30 (×2): 3 mL via INTRAVENOUS

## 2015-08-29 MED ORDER — SODIUM CHLORIDE 0.9 % IV BOLUS (SEPSIS)
1000.0000 mL | Freq: Once | INTRAVENOUS | Status: AC
  Administered 2015-08-29: 1000 mL via INTRAVENOUS

## 2015-08-29 MED ORDER — FAMOTIDINE IN NACL 20-0.9 MG/50ML-% IV SOLN
20.0000 mg | Freq: Two times a day (BID) | INTRAVENOUS | Status: DC
  Administered 2015-08-29 – 2015-08-30 (×2): 20 mg via INTRAVENOUS
  Filled 2015-08-29 (×3): qty 50

## 2015-08-29 MED ORDER — SODIUM CHLORIDE 0.9 % IV SOLN
10.0000 mL/h | Freq: Once | INTRAVENOUS | Status: AC
  Administered 2015-08-29: 10 mL/h via INTRAVENOUS

## 2015-08-29 MED ORDER — SODIUM CHLORIDE 0.9 % IV SOLN: 250.0000 mL | INTRAVENOUS | Status: DC | PRN

## 2015-08-29 MED ORDER — SODIUM CHLORIDE 0.9 % IV SOLN
INTRAVENOUS | Status: DC
  Administered 2015-08-29: 18:00:00 via INTRAVENOUS

## 2015-08-29 MED ORDER — PANTOPRAZOLE SODIUM 40 MG IV SOLR
40.0000 mg | Freq: Once | INTRAVENOUS | Status: AC
  Administered 2015-08-29: 40 mg via INTRAVENOUS
  Filled 2015-08-29: qty 40

## 2015-08-29 MED ORDER — ONDANSETRON HCL 4 MG/2ML IJ SOLN
4.0000 mg | Freq: Once | INTRAMUSCULAR | Status: AC
  Administered 2015-08-29: 4 mg via INTRAVENOUS
  Filled 2015-08-29: qty 2

## 2015-08-29 MED ORDER — SODIUM CHLORIDE 0.9% FLUSH: 3.0000 mL | INTRAVENOUS | Status: DC | PRN

## 2015-08-29 NOTE — ED Triage Notes (Signed)
Patient sent from [facility] with complaint of rectal bleeding that started yesterday. Patient also complaining of right sided abdominal pain.

## 2015-08-29 NOTE — ED Notes (Cosign Needed Addendum)
CRITICAL VALUE ALERT  Critical value received: Lactic Acid 2.2  Date of notification:  08/29/2015  Time of notification: 1748  Critical value read back: Yes   Nurse who received alert:  Hinton Rao, RN   MD notified (1st page): Dr. Gilford Raid

## 2015-08-29 NOTE — ED Provider Notes (Signed)
[facility] DEPT Provider Note   CSN: [id number] Arrival date & time: 08/29/15  1530  First Provider Contact:  None       History   Chief Complaint Chief Complaint  Patient presents with  . Rectal Bleeding  . Abdominal Pain    HPI William Rivera is a 80 y.o. male.  Pt presents today with abdominal pain and rectal bleeding.  He has a hx of a bleeding duodenal ulcer in May of 2017.  The pt said that it started yesterday.  He was recently diagnosed with a.fib and was in the hospital at [facility].  He was on blood thinners while there, but has been off of them for a couple of days.  He also has a hx of pancreatic cancer and has failed chemo and radiation.  He was treated at [facility], but as there is not much more to do for him, he has transferred his care back here with Dr. Whitney Muse.    The history is provided by the patient and the spouse.  Rectal Bleeding  Quality:  Black and tarry Amount:  Moderate Associated symptoms: abdominal pain and dizziness   Abdominal Pain   Associated symptoms include hematochezia.    Past Medical History:  Diagnosis Date  . Adenomatous colon polyp 2010  . Barrett's esophagus    last EGD/Bx 12/11  . Cancer of pancreas, other site AUG 2016   CA 19-9 958.6  . GASTRITIS 09/23/2008   Qualifier: Diagnosis of  By: Nicole Kindred LPN, Doris    . GERD (gastroesophageal reflux disease)   . Gout   . Helicobacter pylori gastritis 2000   s/p treatment  . Hypertension   . Pancreatic cancer ([place])   . Pancreatic cancer [facility])     Patient Active Problem List   Diagnosis Date Noted  . Palliative care encounter   . Duodenal ulcer with hemorrhage   . Protein-calorie malnutrition, severe 06/03/2015  . GI bleeding 06/02/2015  . Acute blood loss anemia 06/02/2015  . AKI (acute kidney injury) ([place]) 06/02/2015  . Syncope 06/02/2015  . Faintness   . Tachycardia   . Hematochezia   . Pancreatic cancer ([place])   . Duodenal stricture   . Obstructive jaundice 01/02/2015    . Pruritus 09/14/2014  . Transaminitis 09/14/2014  . Adenocarcinoma of pancreas ([place]) 09/05/2014  . Pancreatitis 09/05/2014  . Chest pain 01/24/2014  . Dysphagia 11/25/2012  . Adenomatous polyp of colon 08/06/2012  . BARRETTS ESOPHAGUS 02/26/2010  . ALCOHOL USE 09/29/2008  . GOUT, UNSPECIFIED 09/23/2008  . Essential hypertension 09/23/2008  . SINUSITIS, CHRONIC 09/23/2008  . GERD 09/23/2008  . Nonulcer dyspepsia 09/23/2008  . ARTHRITIS 09/23/2008    Past Surgical History:  Procedure Laterality Date  . BILIARY STENT PLACEMENT N/A 09/14/2014   Procedure: BILIARY STENT PLACEMENT;  Surgeon: Rogene Houston, MD;  Location: [facility] ORS;  Service: Endoscopy;  Laterality: N/A;  . BLADDER SURGERY  [id number]  . COLONOSCOPY  2010   simple adenomas  . COLONOSCOPY N/A 07/28/2013   Dr. Oneida Alar: tubular adenoma, mild sigmoid colon diverticulosis, internal hemorhhoids   . DOPPLER ECHOCARDIOGRAPHY  2009  . ERCP N/A 09/14/2014   Dr. Laural Golden: sphincterotomy with 56 F plastic biliary stent placement   . ERCP N/A 01/04/2015   Procedure: ATTEMPTED ENDOSCOPIC RETROGRADE CHOLANGIOPANCREATOGRAPHY (ERCP);  Surgeon: Daneil Dolin, MD;  Location: [facility] ORS;  Service: Endoscopy;  Laterality: N/A;  . ERCP  02/2015   [facility]: biliary stent exchange from plastic to metal stent  . ESOPHAGOGASTRODUODENOSCOPY  2008   DUODENITIS & GASTRITIS 2o TO NSAIDS/ETOH  . ESOPHAGOGASTRODUODENOSCOPY  12/2009   short segment Barrett's, small hh, chronic gastritis  . ESOPHAGOGASTRODUODENOSCOPY  01/04/2011   [id number] gastritis/Barrett's, possible  . ESOPHAGOGASTRODUODENOSCOPY N/A 11/30/2012   Dr. Oneida Alar- normal esophagus, empiric dilation d/t c/o dysphagia/?cervical web, stomach= mild non erosive gastritis, inflammation on bx, duodenum= no abnormalities in the bulb and second portion of the duodenum. dilation at the gastroesphageal junction.  . ESOPHAGOGASTRODUODENOSCOPY N/A 06/02/2015   SLF: 1. normal esophagus 2. gastrtitis mild 3. GI  bleed due to large duodenal ulcer with large visible vessel, high risk to rebleed without prevention   . ESOPHAGOGASTRODUODENOSCOPY (EGD) WITH PROPOFOL N/A 01/04/2015   RMR: partial uoddenal obstruction at junction of 1st and 2nd portion of duodenum, unale to intubate 2nd portion of duodenum to perform ERCP  . HEMORRHOID SURGERY    . MALONEY DILATION N/A 11/30/2012   Procedure: MALONEY DILATION;  Surgeon: Danie Binder, MD;  Location: [facility] ENDO SUITE;  Service: Endoscopy;  Laterality: N/A;  . NASAL SEPTUM SURGERY    . NECK SURGERY     DISC REPLACED  . NM MYOVIEW LTD  2009  . REPLACEMENT TOTAL KNEE Right 11/2010  . SAVORY DILATION N/A 11/30/2012   Procedure: SAVORY DILATION;  Surgeon: Danie Binder, MD;  Location: [facility] ENDO SUITE;  Service: Endoscopy;  Laterality: N/A;  . SINUS EXPLORATION  2000  . SPHINCTEROTOMY N/A 09/14/2014   Procedure: SPHINCTEROTOMY;  Surgeon: Rogene Houston, MD;  Location: [facility] ORS;  Service: Endoscopy;  Laterality: N/A;  . stents in bile ducts    . TONSILLECTOMY     AS A CHILD       Home Medications    Prior to Admission medications   Medication Sig Start Date End Date Taking? Authorizing Provider  feeding supplement (BOOST / RESOURCE BREEZE) LIQD Take 1 Container by mouth 3 (three) times daily with meals. 06/05/15  Yes Reyne Dumas, MD  GAS RELIEF 125 MG CAPS TAKE ONE TO TWO SOFTGELS BY MOUTH DAILY AS NEEDED FOR FLATULENCE 06/02/15  Yes Historical Provider, MD  hydrocortisone (ANUSOL-HC) 2.5 % rectal cream Place 1 application rectally 2 (two) times daily. 08/28/15  Yes Orvil Feil, NP  metoprolol succinate (TOPROL-XL) 100 MG 24 hr tablet Take 100 mg by mouth daily.  08/22/15  Yes Historical Provider, MD  Oxycodone HCl 10 MG TABS Take 1 tablet (10 mg total) by mouth every 4 (four) hours as needed. Patient taking differently: Take 10 mg by mouth 2 (two) times daily as needed (FOR PAIN).  08/24/15  Yes Patrici Ranks, MD  pantoprazole (PROTONIX) 40 MG tablet Take 1  tablet (40 mg total) by mouth 2 (two) times daily. Patient taking differently: Take 40 mg by mouth daily.  06/05/15  Yes Reyne Dumas, MD  sucralfate (CARAFATE) 1 g tablet Take 1 g by mouth 3 (three) times daily.   Yes Historical Provider, MD  ciprofloxacin (CIPRO) 500 MG tablet Take 500 mg by mouth 2 (two) times daily. [place] ON 08/22/2015-COMPLETED COURSE 08/22/15   Historical Provider, MD  metroNIDAZOLE (FLAGYL) 500 MG tablet Take 1,000 mg by mouth 2 (two) times daily. [place] ON 08/22/2015-COMPLETED 08/22/15   Historical Provider, MD    Family History Family History  Problem Relation Age of Onset  . Stomach cancer Father 53    deceased  . Colon cancer Neg Hx     Social History Social History  Substance Use Topics  .  Smoking status: Never Smoker  . Smokeless tobacco: Never Used     Comment: Never smoked much  . Alcohol use No     Comment: former drinks about a pint of borboun per week     Allergies   Doxycycline; Colestipol; and Doxycycline   Review of Systems Review of Systems  Gastrointestinal: Positive for abdominal pain, blood in stool and hematochezia.  Neurological: Positive for dizziness.  All other systems reviewed and are negative.    Physical Exam Updated Vital Signs BP 114/66   Pulse (!) 8   Temp 98.1 F (36.7 C) (Oral)   Resp 24   Ht 5\' 9"  (1.753 m)   Wt 155 lb (70.3 kg)   SpO2 92%   BMI 22.89 kg/m   Physical Exam  Constitutional: He is oriented to person, place, and time. He appears well-developed and well-nourished.  HENT:  Head: Normocephalic and atraumatic.  Right Ear: External ear normal.  Left Ear: External ear normal.  Nose: Nose normal.  Mouth/Throat: Oropharynx is clear and moist.  Eyes: Conjunctivae and EOM are normal. Pupils are equal, round, and reactive to light.  Neck: Normal range of motion. Neck supple.  Cardiovascular: Normal rate, regular rhythm, normal heart sounds and intact distal pulses.     Pulmonary/Chest: Effort normal and breath sounds normal.  Abdominal: Soft. Bowel sounds are normal. There is tenderness in the right upper quadrant.  Genitourinary: Rectal exam shows guaiac positive stool.  Genitourinary Comments: Black stool  Musculoskeletal: Normal range of motion.  Neurological: He is alert and oriented to person, place, and time.  Skin: Skin is warm and dry. There is pallor.  Psychiatric: He has a normal mood and affect. His behavior is normal. Judgment and thought content normal.  Nursing note and vitals reviewed.    ED Treatments / Results  Labs (all labs ordered are listed, but only abnormal results are displayed) Labs Reviewed  COMPREHENSIVE METABOLIC PANEL - Abnormal; Notable for the following:       Result Value   Sodium 134 (*)    CO2 20 (*)    Glucose, Bld 121 (*)    BUN 23 (*)    Creatinine, Ser 2.22 (*)    Calcium 7.9 (*)    Total Protein 6.3 (*)    Albumin 2.4 (*)    AST 51 (*)    GFR calc non Af Amer 27 (*)    GFR calc Af Amer 31 (*)    All other components within normal limits  CBC WITH DIFFERENTIAL/PLATELET - Abnormal; Notable for the following:    WBC 13.6 (*)    RBC 2.50 (*)    Hemoglobin 7.7 (*)    HCT 23.1 (*)    Platelets 516 (*)    Neutro Abs 11.6 (*)    Monocytes Absolute 1.3 (*)    All other components within normal limits  LACTIC ACID, PLASMA - Abnormal; Notable for the following:    Lactic Acid, Venous 2.2 (*)    All other components within normal limits  PROTIME-INR - Abnormal; Notable for the following:    Prothrombin Time 16.4 (*)    All other components within normal limits  POC OCCULT BLOOD, ED - Abnormal; Notable for the following:    Fecal Occult Bld POSITIVE (*)    All other components within normal limits  LIPASE, BLOOD  POC OCCULT BLOOD, ED  PREPARE RBC (CROSSMATCH)  TYPE AND SCREEN    EKG  EKG Interpretation None  Radiology Dg Chest Portable 1 View  Result Date: 08/29/2015 CLINICAL DATA:  GI  bleed. Patient has pancreatic carcinoma. Some shortness of breath. EXAM: PORTABLE CHEST 1 VIEW COMPARISON:  06/01/2015 FINDINGS: Cardiac silhouette is normal in size. No mediastinal or hilar masses or convincing adenopathy. Left anterior chest wall dual lumen Port-A-Cath is stable with its catheter tip near the caval atrial junction. There are prominent bronchovascular markings. Minor scarring or atelectasis noted at the right lateral lung base. Lungs otherwise clear. No pleural effusion or pneumothorax. Bony thorax is demineralized but grossly intact. IMPRESSION: No acute cardiopulmonary disease. Electronically Signed   By: Lajean Manes M.D.   On: 08/29/2015 16:08    Procedures Procedures (including critical care time)  Medications Ordered in ED Medications  sodium chloride 0.9 % bolus 1,000 mL (0 mLs Intravenous Stopped 08/29/15 1826)    And  0.9 %  sodium chloride infusion ( Intravenous New Bag/Given 08/29/15 1827)  pantoprazole (PROTONIX) injection 40 mg (40 mg Intravenous Given 08/29/15 1658)  morphine 4 MG/ML injection 4 mg (4 mg Intravenous Given 08/29/15 1658)  ondansetron (ZOFRAN) injection 4 mg (4 mg Intravenous Given 08/29/15 1658)  0.9 %  sodium chloride infusion (10 mL/hr Intravenous New Bag/Given 08/29/15 1828)     Initial Impression / Assessment and Plan / ED Course  I have reviewed the triage vital signs and the nursing notes.  Pertinent labs & imaging results that were available during my care of the patient were reviewed by me and considered in my medical decision making (see chart for details).  Clinical Course   Pt d/w Dr. Oneida Alar (GI).  He felt that pt needs to be transferred to [facility] where they can do IR to embolize a vessel if necessary.  This is what had to be done in May of this year.    Pt d/w Dr. Shanon Brow who will do the admission.  She requested that I speak with [facility].  Pt saw [facility] Paulita Fujita at [facility] back in May, so I will consult [facility].  [facility] spoke with [facility] and  since pt was only seen in the hospital in May and is not followed as an outpatient, they want pt to go to unassigned.  I spoke with Dr. Loletha Carrow from [place] and he will see pt at [facility].  Final Clinical Impressions(s) / ED Diagnoses   Final diagnoses:  Acute GI bleeding  Symptomatic anemia  Malignant neoplasm of pancreas, unspecified location of malignancy [facility])    New Prescriptions New Prescriptions   No medications on file     Isla Pence, MD 08/30/15 1259

## 2015-08-29 NOTE — ED Notes (Signed)
poc occult blood re run. Result positive

## 2015-08-29 NOTE — H&P (Signed)
History and Physical    William Rivera [id number] DOB: 03/21/1935 DOA: 08/29/2015  PCP: [name], MD  Patient coming from: home  Chief Complaint:   bleeding  HPI: William Rivera is a 80 y.o. male with medical history significant of pancreatic cancer, prev GIB requiring embolization to duodenum by IR at [facility], GERD, HTN comes in with one day of bloody stools.  Pt says it started yesterday with dark maroonish colored stool and has become darker.  He is having epigastric discomfort.  No vomiting.  He started to get weak today and called his local GI doctor dr fields who advised him to use a suppository and to come to the ED if it continued.  It continued, his vitals are stable but his hgb has dropped to 7.7 from 12 in the last couple of months.  He is not on any blood thinners.  Dr fields called who advised transfer to [place] for his previous history of requiring embolization.  Review of Systems: As per HPI otherwise 10 point review of systems negative.   Past Medical History:  Diagnosis Date  . Adenomatous colon polyp 2010  . Barrett's esophagus    last EGD/Bx 12/11  . Cancer of pancreas, other site AUG 2016   CA 19-9 958.6  . GASTRITIS 09/23/2008   Qualifier: Diagnosis of  By: Nicole Kindred LPN, Doris    . GERD (gastroesophageal reflux disease)   . Gout   . Helicobacter pylori gastritis 2000   s/p treatment  . Hypertension   . Pancreatic cancer ([place])   . Pancreatic cancer [facility])     Past Surgical History:  Procedure Laterality Date  . BILIARY STENT PLACEMENT N/A 09/14/2014   Procedure: BILIARY STENT PLACEMENT;  Surgeon: Rogene Houston, MD;  Location: [facility] ORS;  Service: Endoscopy;  Laterality: N/A;  . BLADDER SURGERY  [id number]  . COLONOSCOPY  2010   simple adenomas  . COLONOSCOPY N/A 07/28/2013   Dr. Oneida Alar: tubular adenoma, mild sigmoid colon diverticulosis, internal hemorhhoids   . DOPPLER ECHOCARDIOGRAPHY  2009  . ERCP N/A 09/14/2014   Dr. Laural Golden: sphincterotomy with 31  F plastic biliary stent placement   . ERCP N/A 01/04/2015   Procedure: ATTEMPTED ENDOSCOPIC RETROGRADE CHOLANGIOPANCREATOGRAPHY (ERCP);  Surgeon: Daneil Dolin, MD;  Location: [facility] ORS;  Service: Endoscopy;  Laterality: N/A;  . ERCP  02/2015   [facility]: biliary stent exchange from plastic to metal stent  . ESOPHAGOGASTRODUODENOSCOPY  2008   DUODENITIS & GASTRITIS 2o TO NSAIDS/ETOH  . ESOPHAGOGASTRODUODENOSCOPY  12/2009   short segment Barrett's, small hh, chronic gastritis  . ESOPHAGOGASTRODUODENOSCOPY  01/04/2011   [id number] gastritis/Barrett's, possible  . ESOPHAGOGASTRODUODENOSCOPY N/A 11/30/2012   Dr. Oneida Alar- normal esophagus, empiric dilation d/t c/o dysphagia/?cervical web, stomach= mild non erosive gastritis, inflammation on bx, duodenum= no abnormalities in the bulb and second portion of the duodenum. dilation at the gastroesphageal junction.  . ESOPHAGOGASTRODUODENOSCOPY N/A 06/02/2015   SLF: 1. normal esophagus 2. gastrtitis mild 3. GI bleed due to large duodenal ulcer with large visible vessel, high risk to rebleed without prevention   . ESOPHAGOGASTRODUODENOSCOPY (EGD) WITH PROPOFOL N/A 01/04/2015   RMR: partial uoddenal obstruction at junction of 1st and 2nd portion of duodenum, unale to intubate 2nd portion of duodenum to perform ERCP  . HEMORRHOID SURGERY    . MALONEY DILATION N/A 11/30/2012   Procedure: MALONEY DILATION;  Surgeon: Danie Binder, MD;  Location: [facility] SU[facility];  Service: Endoscopy;  Laterality: N/A;  . NASAL  SEPTUM SURGERY    . NECK SURGERY     DISC REPLACED  . NM MYOVIEW LTD  2009  . REPLACEMENT TOTAL KNEE Right 11/2010  . SAVORY DILATION N/A 11/30/2012   Procedure: SAVORY DILATION;  Surgeon: Danie Binder, MD;  Location: [facility] SU[facility];  Service: Endoscopy;  Laterality: N/A;  . SINUS EXPLORATION  2000  . SPHINCTEROTOMY N/A 09/14/2014   Procedure: SPHINCTEROTOMY;  Surgeon: Rogene Houston, MD;  Location: [facility] ORS;  Service: Endoscopy;  Laterality: N/A;  . stents in  bile ducts    . TONSILLECTOMY     AS A CHILD     reports that he has never smoked. He has never used smokeless tobacco. He reports that he does not drink alcohol or use drugs.  Allergies  Allergen Reactions  . Doxycycline Itching    Severe itching   . Colestipol Itching  . Doxycycline Rash    Family History  Problem Relation Age of Onset  . Stomach cancer Father 79    deceased  . Colon cancer Neg Hx     Prior to Admission medications   Medication Sig Start Date End Date Taking? Authorizing Provider  feeding supplement (BOOST / RESOURCE BREEZE) LIQD Take 1 Container by mouth 3 (three) times daily with meals. 06/05/15  Yes Reyne Dumas, MD  GAS RELIEF 125 MG CAPS TAKE ONE TO TWO SOFTGELS BY MOUTH DAILY AS NEEDED FOR FLATULENCE 06/02/15  Yes Historical Provider, MD  hydrocortisone (ANUSOL-HC) 2.5 % rectal cream Place 1 application rectally 2 (two) times daily. 08/28/15  Yes Orvil Feil, NP  metoprolol succinate (TOPROL-XL) 100 MG 24 hr tablet Take 100 mg by mouth daily.  08/22/15  Yes Historical Provider, MD  Oxycodone HCl 10 MG TABS Take 1 tablet (10 mg total) by mouth every 4 (four) hours as needed. Patient taking differently: Take 10 mg by mouth 2 (two) times daily as needed (FOR PAIN).  08/24/15  Yes Patrici Ranks, MD  pantoprazole (PROTONIX) 40 MG tablet Take 1 tablet (40 mg total) by mouth 2 (two) times daily. Patient taking differently: Take 40 mg by mouth daily.  06/05/15  Yes Reyne Dumas, MD  sucralfate (CARAFATE) 1 g tablet Take 1 g by mouth 3 (three) times daily.   Yes Historical Provider, MD  ciprofloxacin (CIPRO) 500 MG tablet Take 500 mg by mouth 2 (two) times daily. [place] ON 08/22/2015-COMPLETED COURSE 08/22/15   Historical Provider, MD  metroNIDAZOLE (FLAGYL) 500 MG tablet Take 1,000 mg by mouth 2 (two) times daily. [place] 08/22/2015-COMPLETED 08/22/15   Historical Provider, MD    Physical Exam: Vitals:   08/29/15 1613 08/29/15 1828 08/29/15  1900 08/29/15 1936  BP: 99/61  114/66 112/65  Pulse: 83 (!) 8  81  Resp: 20 20 24 19   Temp: 98.1 F (36.7 C)   98 F (36.7 C)  TempSrc: Oral     SpO2: 96% 92%  95%  Weight:      Height:          Constitutional: NAD, calm, comfortable Vitals:   08/29/15 1613 08/29/15 1828 08/29/15 1900 08/29/15 1936  BP: 99/61  114/66 112/65  Pulse: 83 (!) 8  81  Resp: 20 20 24 19   Temp: 98.1 F (36.7 C)   98 F (36.7 C)  TempSrc: Oral     SpO2: 96% 92%  95%  Weight:      Height:       Eyes: PERRL,  lids and conjunctivae normal, pale ENMT: Mucous membranes are moist. Posterior pharynx clear of any exudate or lesions.Normal dentition.  Neck: normal, supple, no masses, no thyromegaly Respiratory: clear to auscultation bilaterally, no wheezing, no crackles. Normal respiratory effort. No accessory muscle use.  Cardiovascular: Regular rate and rhythm, no murmurs / rubs / gallops. No extremity edema. 2+ pedal pulses. No carotid bruits.  Abdomen: no tenderness, no masses palpated. No hepatosplenomegaly. Bowel sounds positive.  Musculoskeletal: no clubbing / cyanosis. No joint deformity upper and lower extremities. Good ROM, no contractures. Normal muscle tone.  Skin: no rashes, lesions, ulcers. No induration Neurologic: CN 2-12 grossly intact. Sensation intact, DTR normal. Strength 5/5 in all 4.  Psychiatric: Normal judgment and insight. Alert and oriented x 3. Normal mood.    Labs on Admission: I have personally reviewed following labs and imaging studies  CBC:  Recent Labs Lab 08/29/15 1650  WBC 13.6*  NEUTROABS 11.6*  HGB 7.7*  HCT 23.1*  MCV 92.4  PLT 99991111*   Basic Metabolic Panel:  Recent Labs Lab 08/29/15 1650  NA 134*  K 3.9  CL 109  CO2 20*  GLUCOSE 121*  BUN 23*  CREATININE 2.22*  CALCIUM 7.9*   GFR: Estimated Creatinine Clearance: 27.7 mL/min (by C-G formula based on SCr of 2.22 mg/dL). Liver Function Tests:  Recent Labs Lab 08/29/15 1650  AST 51*  ALT 32    ALKPHOS 103  BILITOT 0.4  PROT 6.3*  ALBUMIN 2.4*    Recent Labs Lab 08/29/15 1650  LIPASE 25   No results for input(s): AMMONIA in the last 168 hours. Coagulation Profile:  Recent Labs Lab 08/29/15 1650  INR 1.31   Urine analysis:    Component Value Date/Time   COLORURINE YELLOW 08/11/2015 [place] 08/11/2015 0953   LABSPEC 1.020 08/11/2015 0953   PHURINE 5.5 08/11/2015 [place] 08/11/2015 0953   HGBUR NEGATIVE 08/11/2015 0953   BILIRUBINUR NEGATIVE 08/11/2015 0953   KETONESUR NEGATIVE 08/11/2015 0953   PROTEINUR 30 (A) 08/11/2015 0953   NITRITE NEGATIVE 08/11/2015 0953   LEUKOCYTESUR NEGATIVE 08/11/2015 0953   Radiological Exams on Admission: Dg Chest Portable 1 View  Result Date: 08/29/2015 CLINICAL DATA:  GI bleed. Patient has pancreatic carcinoma. Some shortness of breath. EXAM: PORTABLE CHEST 1 VIEW COMPARISON:  06/01/2015 FINDINGS: Cardiac silhouette is normal in size. No mediastinal or hilar masses or convincing adenopathy. Left anterior chest wall dual lumen Port-A-Cath is stable with its catheter tip near the caval atrial junction. There are prominent bronchovascular markings. Minor scarring or atelectasis noted at the right lateral lung base. Lungs otherwise clear. No pleural effusion or pneumothorax. Bony thorax is demineralized but grossly intact. IMPRESSION: No acute cardiopulmonary disease. Electronically Signed   By: Lajean Manes M.D.   On: 08/29/2015 16:08    Assessment/Plan 80 yo male with acute blood loss anemia from likely upper GIB  Principal Problem:   GI bleed- pt got protonix 40mg  iv once in ED, place on pepcid q 12 (due to shortage).  Transfusing 2 units prbcs already initiated in  ED.  Transferring to [place].  Gi on call at [facility] has been notified of consult.  Have also notified triad hospitalist dr Hal Hope of pt transfer.  He is stable.  Keep npo and hold all anticoagulants  Active Problems:    Acute blood loss anemia- secondary to gi blood losses.  Transfusing 2 units.   Duodenal ulcer with hemorrhage- s/p embolization by IR  at [facility] in may of 2017   Essential hypertension- noted, holding bp meds in setting of active bleeding   BARRETTS ESOPHAGUS- noted   Adenocarcinoma of pancreas ([place])- noted   AKI (acute kidney injury) ([place])- likely from prerenal losses.  Replete volume.  Repeat cr in am   Admit to tele[facility] floor at [facility].  Pt is DNR, confirmed with pt.  He is agreeable to any intervention needed to stop his bleeding, blood products, abx and ivf if needed.   DVT prophylaxis:   scds Code Status:   DNR  Ojas Coone A MD [facility]  If 7PM-7AM, please contact night-coverage [email or web address] Password [facility]  08/29/2015, 7:53 PM

## 2015-08-29 NOTE — Progress Notes (Signed)
Patient's blood finished infusing at 9:50PM. Vital signs taken at 9:52PM, temp 98.2, BP 112/51, HR 83, and 94% on room air. Patient lying in bed comfortably with no signs of pain or distress.

## 2015-08-29 NOTE — Progress Notes (Signed)
Patient arrived via [facility] from [facility] with blood infusing at 224mL/hr. Blood tag and Blood Product Transfusion Audit paper were not sent with patient. Blood product consent form was sent with patient and in the patient's chart.

## 2015-08-30 ENCOUNTER — Encounter (HOSPITAL_COMMUNITY)
Admission: EM | Disposition: A | Discharge status: Home / Self Care | Payer: Self-pay | Source: Home / Self Care | Attending: Internal Medicine

## 2015-08-30 ENCOUNTER — Inpatient Hospital Stay (HOSPITAL_COMMUNITY): Payer: Medicare Other

## 2015-08-30 ENCOUNTER — Encounter (HOSPITAL_COMMUNITY): Payer: Self-pay

## 2015-08-30 ENCOUNTER — Other Ambulatory Visit: Payer: Self-pay

## 2015-08-30 DIAGNOSIS — I471 Supraventricular tachycardia, unspecified: Secondary | ICD-10-CM

## 2015-08-30 DIAGNOSIS — K921 Melena: Secondary | ICD-10-CM

## 2015-08-30 DIAGNOSIS — K264 Chronic or unspecified duodenal ulcer with hemorrhage: Principal | ICD-10-CM

## 2015-08-30 DIAGNOSIS — D62 Acute posthemorrhagic anemia: Secondary | ICD-10-CM

## 2015-08-30 DIAGNOSIS — K922 Gastrointestinal hemorrhage, unspecified: Secondary | ICD-10-CM

## 2015-08-30 HISTORY — DX: Supraventricular tachycardia: I47.1

## 2015-08-30 HISTORY — PX: IR GENERIC HISTORICAL: IMG1180011

## 2015-08-30 HISTORY — PX: ESOPHAGOGASTRODUODENOSCOPY: SHX5428

## 2015-08-30 HISTORY — DX: Supraventricular tachycardia, unspecified: I47.10

## 2015-08-30 LAB — CBC
HCT: 24.6 % — ABNORMAL LOW (ref 39.0–52.0)
HEMATOCRIT: 26.2 % — AB (ref 39.0–52.0)
HEMOGLOBIN: 8.2 g/dL — AB (ref 13.0–17.0)
Hemoglobin: 8.5 g/dL — ABNORMAL LOW (ref 13.0–17.0)
MCH: 30.8 pg (ref 26.0–34.0)
MCH: 29.7 pg (ref 26.0–34.0)
MCHC: 33.3 g/dL (ref 30.0–36.0)
MCHC: 32.4 g/dL (ref 30.0–36.0)
MCV: 92.5 fL (ref 78.0–100.0)
MCV: 91.6 fL (ref 78.0–100.0)
PLATELETS: 331 10*3/uL (ref 150–400)
PLATELETS: 340 10*3/uL (ref 150–400)
RBC: 2.66 MIL/uL — AB (ref 4.22–5.81)
RBC: 2.86 MIL/uL — ABNORMAL LOW (ref 4.22–5.81)
RDW: 14.7 % (ref 11.5–15.5)
RDW: 15.9 % — AB (ref 11.5–15.5)
WBC: 10.7 10*3/uL — AB (ref 4.0–10.5)
WBC: 9.4 10*3/uL (ref 4.0–10.5)

## 2015-08-30 LAB — BASIC METABOLIC PANEL
ANION GAP: 5 (ref 5–15)
Anion gap: 4 — ABNORMAL LOW (ref 5–15)
BUN: 18 mg/dL (ref 6–20)
BUN: 17 mg/dL (ref 6–20)
CALCIUM: 8.2 mg/dL — AB (ref 8.9–10.3)
CHLORIDE: 114 mmol/L — AB (ref 101–111)
CO2: 21 mmol/L — ABNORMAL LOW (ref 22–32)
CO2: 19 mmol/L — ABNORMAL LOW (ref 22–32)
CREATININE: 1.98 mg/dL — AB (ref 0.61–1.24)
CREATININE: 1.93 mg/dL — AB (ref 0.61–1.24)
Calcium: 7.7 mg/dL — ABNORMAL LOW (ref 8.9–10.3)
Chloride: 117 mmol/L — ABNORMAL HIGH (ref 101–111)
GFR calc Af Amer: 37 mL/min — ABNORMAL LOW (ref >60)
GFR calc non Af Amer: 31 mL/min — ABNORMAL LOW (ref >60)
GFR, EST AFRICAN AMERICAN: 36 mL/min — AB (ref >60)
GFR, EST NON AFRICAN AMERICAN: 32 mL/min — AB (ref >60)
GLUCOSE: 111 mg/dL — AB (ref 65–99)
Glucose, Bld: 102 mg/dL — ABNORMAL HIGH (ref 65–99)
POTASSIUM: 4.2 mmol/L (ref 3.5–5.1)
Potassium: 4.3 mmol/L (ref 3.5–5.1)
SODIUM: 140 mmol/L (ref 135–145)
SODIUM: 140 mmol/L (ref 135–145)

## 2015-08-30 LAB — PREPARE RBC (CROSSMATCH)

## 2015-08-30 LAB — PROTIME-INR
INR: 1.4
PROTHROMBIN TIME: 17.3 s — AB (ref 11.4–15.2)

## 2015-08-30 LAB — SURGICAL PCR SCREEN
MRSA, PCR: NEGATIVE
STAPHYLOCOCCUS AUREUS: NEGATIVE

## 2015-08-30 LAB — POC OCCULT BLOOD, ED: FECAL OCCULT BLD: NEGATIVE

## 2015-08-30 LAB — LACTIC ACID, PLASMA: LACTIC ACID, VENOUS: 0.8 mmol/L (ref 0.5–1.9)

## 2015-08-30 LAB — TROPONIN I: Troponin I: 0.04 ng/mL (ref <0.03)

## 2015-08-30 SURGERY — EGD (ESOPHAGOGASTRODUODENOSCOPY)
Anesthesia: Moderate Sedation

## 2015-08-30 MED ORDER — FENTANYL CITRATE (PF) 100 MCG/2ML IJ SOLN
INTRAMUSCULAR | Status: AC
  Filled 2015-08-30: qty 2

## 2015-08-30 MED ORDER — IOPAMIDOL (ISOVUE-300) INJECTION 61%
INTRAVENOUS | Status: AC
  Filled 2015-08-30: qty 100

## 2015-08-30 MED ORDER — IOPAMIDOL (ISOVUE-300) INJECTION 61%
INTRAVENOUS | Status: AC
  Filled 2015-08-30: qty 150

## 2015-08-30 MED ORDER — ADENOSINE 6 MG/2ML IV SOLN
INTRAVENOUS | Status: AC
  Filled 2015-08-30: qty 2

## 2015-08-30 MED ORDER — LIDOCAINE HCL 1 % IJ SOLN
INTRAMUSCULAR | Status: AC
  Filled 2015-08-30: qty 20

## 2015-08-30 MED ORDER — MIDAZOLAM HCL 5 MG/ML IJ SOLN
INTRAMUSCULAR | Status: AC
  Filled 2015-08-30: qty 2

## 2015-08-30 MED ORDER — SODIUM CHLORIDE 0.9 % IV SOLN
Freq: Once | INTRAVENOUS | Status: AC
  Administered 2015-08-30: 17:00:00 via INTRAVENOUS

## 2015-08-30 MED ORDER — SODIUM CHLORIDE 0.9 % IV SOLN
8.0000 mg/h | INTRAVENOUS | Status: DC
  Administered 2015-08-30 – 2015-09-02 (×6): 8 mg/h via INTRAVENOUS
  Filled 2015-08-30 (×13): qty 80

## 2015-08-30 MED ORDER — FENTANYL CITRATE (PF) 100 MCG/2ML IJ SOLN
INTRAMUSCULAR | Status: AC
Start: 2015-08-30 — End: 2015-08-31
  Filled 2015-08-30: qty 2

## 2015-08-30 MED ORDER — MIDAZOLAM HCL 2 MG/2ML IJ SOLN
INTRAMUSCULAR | Status: AC
  Filled 2015-08-30: qty 2

## 2015-08-30 MED ORDER — FENTANYL CITRATE (PF) 100 MCG/2ML IJ SOLN
INTRAMUSCULAR | Status: AC | PRN
  Administered 2015-08-30: 25 ug via INTRAVENOUS

## 2015-08-30 MED ORDER — MIDAZOLAM HCL 10 MG/2ML IJ SOLN
INTRAMUSCULAR | Status: DC | PRN
Start: 2015-08-30 — End: 2015-08-30
  Administered 2015-08-30: 2 mg via INTRAVENOUS

## 2015-08-30 MED ORDER — FENTANYL CITRATE (PF) 100 MCG/2ML IJ SOLN
INTRAMUSCULAR | Status: DC | PRN
  Administered 2015-08-30: 25 ug via INTRAVENOUS
  Administered 2015-08-30: 12.5 ug via INTRAVENOUS

## 2015-08-30 MED ORDER — METOPROLOL TARTRATE 5 MG/5ML IV SOLN: 2.5000 mg | Freq: Four times a day (QID) | INTRAVENOUS | Status: DC

## 2015-08-30 MED ORDER — SODIUM CHLORIDE 0.9 % IV SOLN: INTRAVENOUS | Status: DC

## 2015-08-30 MED ORDER — SODIUM CHLORIDE 0.9 % IV SOLN
INTRAVENOUS | Status: DC
  Administered 2015-08-30 – 2015-09-02 (×6): via INTRAVENOUS

## 2015-08-30 MED ORDER — ADENOSINE 6 MG/2ML IV SOLN
6.0000 mg | Freq: Once | INTRAVENOUS | Status: AC
  Administered 2015-08-30: 6 mg via INTRAVENOUS

## 2015-08-30 MED ORDER — BUTAMBEN-TETRACAINE-BENZOCAINE 2-2-14 % EX AERO
INHALATION_SPRAY | CUTANEOUS | Status: DC | PRN
  Administered 2015-08-30: 2 via TOPICAL

## 2015-08-30 NOTE — Progress Notes (Signed)
Being transferred to 2 S due to active GI bleed

## 2015-08-30 NOTE — Progress Notes (Signed)
NURSING PROGRESS NOTE  William Rivera [facility]: [id number] Admission Data: 08/29/2015 at 9:55PM Attending Provider: Phillips Grout, MD PCP: [name], MD Code status:DNR  Allergies:  Allergies  Allergen Reactions  . Doxycycline Itching    Severe itching   . Colestipol Itching  . Doxycycline Rash   Past Medical History:  Past Medical History:  Diagnosis Date  . Adenomatous colon polyp 2010  . Barrett's esophagus    last EGD/Bx 12/11  . Cancer of pancreas, other site AUG 2016   CA 19-9 958.6  . GASTRITIS 09/23/2008   Qualifier: Diagnosis of  By: Nicole Kindred LPN, Doris    . GERD (gastroesophageal reflux disease)   . Gout   . Helicobacter pylori gastritis 2000   s/p treatment  . Hypertension   . Pancreatic cancer ([place])   . Pancreatic cancer [facility])    Past Surgical History:  Past Surgical History:  Procedure Laterality Date  . BILIARY STENT PLACEMENT N/A 09/14/2014   Procedure: BILIARY STENT PLACEMENT;  Surgeon: Rogene Houston, MD;  Location: [facility] ORS;  Service: Endoscopy;  Laterality: N/A;  . BLADDER SURGERY  [id number]  . COLONOSCOPY  2010   simple adenomas  . COLONOSCOPY N/A 07/28/2013   Dr. Oneida Alar: tubular adenoma, mild sigmoid colon diverticulosis, internal hemorhhoids   . DOPPLER ECHOCARDIOGRAPHY  2009  . ERCP N/A 09/14/2014   Dr. Laural Golden: sphincterotomy with 37 F plastic biliary stent placement   . ERCP N/A 01/04/2015   Procedure: ATTEMPTED ENDOSCOPIC RETROGRADE CHOLANGIOPANCREATOGRAPHY (ERCP);  Surgeon: Daneil Dolin, MD;  Location: [facility] ORS;  Service: Endoscopy;  Laterality: N/A;  . ERCP  02/2015   [facility]: biliary stent exchange from plastic to metal stent  . ESOPHAGOGASTRODUODENOSCOPY  2008   DUODENITIS & GASTRITIS 2o TO NSAIDS/ETOH  . ESOPHAGOGASTRODUODENOSCOPY  12/2009   short segment Barrett's, small hh, chronic gastritis  . ESOPHAGOGASTRODUODENOSCOPY  01/04/2011   [id number] gastritis/Barrett's, possible  . ESOPHAGOGASTRODUODENOSCOPY N/A 11/30/2012   Dr. Oneida Alar-  normal esophagus, empiric dilation d/t c/o dysphagia/?cervical web, stomach= mild non erosive gastritis, inflammation on bx, duodenum= no abnormalities in the bulb and second portion of the duodenum. dilation at the gastroesphageal junction.  . ESOPHAGOGASTRODUODENOSCOPY N/A 06/02/2015   SLF: 1. normal esophagus 2. gastrtitis mild 3. GI bleed due to large duodenal ulcer with large visible vessel, high risk to rebleed without prevention   . ESOPHAGOGASTRODUODENOSCOPY (EGD) WITH PROPOFOL N/A 01/04/2015   RMR: partial uoddenal obstruction at junction of 1st and 2nd portion of duodenum, unale to intubate 2nd portion of duodenum to perform ERCP  . HEMORRHOID SURGERY    . MALONEY DILATION N/A 11/30/2012   Procedure: MALONEY DILATION;  Surgeon: Danie Binder, MD;  Location: [facility] ENDO SUITE;  Service: Endoscopy;  Laterality: N/A;  . NASAL SEPTUM SURGERY    . NECK SURGERY     DISC REPLACED  . NM MYOVIEW LTD  2009  . REPLACEMENT TOTAL KNEE Right 11/2010  . SAVORY DILATION N/A 11/30/2012   Procedure: SAVORY DILATION;  Surgeon: Danie Binder, MD;  Location: [facility] ENDO SUITE;  Service: Endoscopy;  Laterality: N/A;  . SINUS EXPLORATION  2000  . SPHINCTEROTOMY N/A 09/14/2014   Procedure: SPHINCTEROTOMY;  Surgeon: Rogene Houston, MD;  Location: [facility] ORS;  Service: Endoscopy;  Laterality: N/A;  . stents in bile ducts    . TONSILLECTOMY     AS A CHILD   William Rivera is a 80 y.o. male patient, arrived to floor in room 5W24 via [facility], transferred from  [facility]. Patient alert and oriented X 4. No acute distress noted. Complains of pain 7/10 abdominal pain.    Vital signs: Oral temperature 98.2 F (36.8 C), Blood pressure 114/63, Pulse 83, RR 18, SpO2 98 % on room air. Height 5'9", weight 165 lbs (75.161 kg).   Cardiac monitoring: Telemetry box 5W #21 in place. Verified by [place] Siren.  IV access: Right hand IV infusing blood transfusion at 251mL/hr; condition patent and no redness.  Skin: intact, no  pressure ulcer noted in sacral area.   Patient's ID armband verified with patient and in place. Second verified by [employer] Information packet given to patient. Fall risk assessed, SR up X2, patient able to verbalize understanding of risks associated with falls and to call nurse or staff to assist before getting out of bed. Patient oriented to room and equipment. Call bell within reach.

## 2015-08-30 NOTE — Sedation Documentation (Signed)
Adenosine 6mg  IV given per Rapid RN, pt on defib pads as protocol, rate 190's

## 2015-08-30 NOTE — Sedation Documentation (Signed)
Exoseal to right groin.

## 2015-08-30 NOTE — Sedation Documentation (Signed)
Pt has not needed sedation for procedure. Denies pain, very cooperative and still

## 2015-08-30 NOTE — Consult Note (Signed)
[facility] Consult: 10:26 AM 08/30/2015  LOS: 1 day    Referring Provider: Dr Erlinda Hong  Primary Care Physician:  [name], MD Primary Gastroenterologist:  Dr. Barney Drain     Reason for Consultation:  GI bleed.     HPI: William Rivera is a 80 y.o. male.  Hx HTN, GERD.  Stage 2A pancreatic adenoca.  Initially diagnosed in 08/2014 after presenting with pancreatitis.    No resection due to lung mets.  ERCP and covered metal stent in 02/2015 at [facility].  Did not tolerate chemo in 02/2015 or via intra peritonieal catheter in 06/2015 and declines further treatment.   Hospice now involved but has not yet made a home visit.  GI bleed and EGD in 05/2015, EGD findings below.  Pt ended up transferred to [facility] and underwent GDA embolization 06/03/15.  Transfused with PRBCs for blood loss anemia.  Stools brown, Hgb 10.3 at discharge.  Takes oxycodone for tumor related epigastric abd pain.   Previous endoscopic studies: 2009-12-30 EGD Dr Gala Romney.   12/31/10 EGD for GERD, abdominal pain, barretts screening.  Dr Oneida Alar.  Mild gastritis, and ? Barretts 11/2012 EGD with esophageal dilatation.  Dr Oneida Alar.   Mild non-erosive gastritis.  Empiric esophageal dilatation but no stricture present.  07/2013 Colonoscopy, avg risk screeing.  3 mm, sessile ascending polyp (  ).  Mild sigmoid tics with associated muscular hypertrophy.  Internal rrhoids. 09/14/2014 ERCP with finding of distal CBD and pancreatic duct stricture, sphincterotomy and placement of plastic biliary stent.  08/2014 EUS with FNA.     December 31, 2014 ERCP with balloon dilatation of proximal duodenal stricture and removal, replacement of plastic biliary stent.  02/2015 ERCP with biliary stent exchange to metal stent.  04/2015 EUS with neurolysis for pain mgt.  Dr Delrae Alfred at [facility].    06/02/2015 EGD for melena, hematochezia.  Mild gastritis, large DU with VV but not bleeding: no hemostatic therapy.  Unable to pass scope beyond bulb due to presence of stent.   06/03/2015 IR embolization of gastroduodenal artery.   Hgb 12.3 on 7/21.  CT scan 7/21: no change in mass at head of pancreas.  Progression of innumerble small lung nodules.   7/26 - 7/31 admitted to [name] for pain mgt, found to be in [place].  Converted to sinus rhythm with amiodarone. CT suspicious for cholangitis and treated with 7 days Cipro, Flagyl.  7/26 ultrasound: Common bile duct stent is in place. The common bile duct is mildly dilated. Pneumobilia partially obscures evaluation the common bile duct. The visualized portions of the common bile duct measure a maximum of 9 mm.    8/7 passed small to moderate amount of red blood, happened twice, 3rd episode: larger volume of red blood, after that stools became darker. Last episode of bleeding was ~ noon on 8/8.  No change in positional dizziness.  No nausea, food just tastes funny ever since he had chemo.  + weight loss.  Last week pain relocated from upper abdomen into LLQ.  Says pain not as well controlled after  switched from hydrocodone to oxycodone at discharge 7/31.    Hgb 7.7.  Platelets normal.  PT/INR 17.3/1.4.   S/p PRBC x 1 of 2.      Past Medical History:  Diagnosis Date  . Adenomatous colon polyp 2010  . Barrett's esophagus    last EGD/Bx 12/11  . Cancer of pancreas, other site AUG 2016   CA 19-9 958.6  . GASTRITIS 09/23/2008   Qualifier: Diagnosis of  By: Nicole Kindred LPN, Doris    . GERD (gastroesophageal reflux disease)   . Gout   . Helicobacter pylori gastritis 2000   s/p treatment  . Hypertension   . Pancreatic cancer ([place])   . Pancreatic cancer [facility])     Past Surgical History:  Procedure Laterality Date  . BILIARY STENT PLACEMENT N/A 09/14/2014   Procedure: BILIARY STENT PLACEMENT;  Surgeon: Rogene Houston, MD;  Location: [facility] ORS;  Service:  Endoscopy;  Laterality: N/A;  . BLADDER SURGERY  [id number]  . COLONOSCOPY  2010   simple adenomas  . COLONOSCOPY N/A 07/28/2013   Dr. Oneida Alar: tubular adenoma, mild sigmoid colon diverticulosis, internal hemorhhoids   . DOPPLER ECHOCARDIOGRAPHY  2009  . ERCP N/A 09/14/2014   Dr. Laural Golden: sphincterotomy with 73 F plastic biliary stent placement   . ERCP N/A 01/04/2015   Procedure: ATTEMPTED ENDOSCOPIC RETROGRADE CHOLANGIOPANCREATOGRAPHY (ERCP);  Surgeon: Daneil Dolin, MD;  Location: [facility] ORS;  Service: Endoscopy;  Laterality: N/A;  . ERCP  02/2015   [facility]: biliary stent exchange from plastic to metal stent  . ESOPHAGOGASTRODUODENOSCOPY  2008   DUODENITIS & GASTRITIS 2o TO NSAIDS/ETOH  . ESOPHAGOGASTRODUODENOSCOPY  12/2009   short segment Barrett's, small hh, chronic gastritis  . ESOPHAGOGASTRODUODENOSCOPY  01/04/2011   [id number] gastritis/Barrett's, possible  . ESOPHAGOGASTRODUODENOSCOPY N/A 11/30/2012   Dr. Oneida Alar- normal esophagus, empiric dilation d/t c/o dysphagia/?cervical web, stomach= mild non erosive gastritis, inflammation on bx, duodenum= no abnormalities in the bulb and second portion of the duodenum. dilation at the gastroesphageal junction.  . ESOPHAGOGASTRODUODENOSCOPY N/A 06/02/2015   SLF: 1. normal esophagus 2. gastrtitis mild 3. GI bleed due to large duodenal ulcer with large visible vessel, high risk to rebleed without prevention   . ESOPHAGOGASTRODUODENOSCOPY (EGD) WITH PROPOFOL N/A 01/04/2015   RMR: partial uoddenal obstruction at junction of 1st and 2nd portion of duodenum, unale to intubate 2nd portion of duodenum to perform ERCP  . HEMORRHOID SURGERY    . MALONEY DILATION N/A 11/30/2012   Procedure: MALONEY DILATION;  Surgeon: Danie Binder, MD;  Location: [facility] ENDO SUITE;  Service: Endoscopy;  Laterality: N/A;  . NASAL SEPTUM SURGERY    . NECK SURGERY     DISC REPLACED  . NM MYOVIEW LTD  2009  . REPLACEMENT TOTAL KNEE Right 11/2010  . SAVORY DILATION N/A 11/30/2012    Procedure: SAVORY DILATION;  Surgeon: Danie Binder, MD;  Location: [facility] ENDO SUITE;  Service: Endoscopy;  Laterality: N/A;  . SINUS EXPLORATION  2000  . SPHINCTEROTOMY N/A 09/14/2014   Procedure: SPHINCTEROTOMY;  Surgeon: Rogene Houston, MD;  Location: [facility] ORS;  Service: Endoscopy;  Laterality: N/A;  . stents in bile ducts    . TONSILLECTOMY     AS A CHILD    Prior to Admission medications   Medication Sig Start Date End Date Taking? Authorizing Provider  feeding supplement (BOOST / RESOURCE BREEZE) LIQD Take 1 Container by mouth 3 (three) times daily with meals. 06/05/15  Yes Reyne Dumas, MD  GAS RELIEF 125 MG CAPS  TAKE ONE TO TWO SOFTGELS BY MOUTH DAILY AS NEEDED FOR FLATULENCE 06/02/15  Yes Historical Provider, MD  hydrocortisone (ANUSOL-HC) 2.5 % rectal cream Place 1 application rectally 2 (two) times daily. 08/28/15  Yes Orvil Feil, NP  metoprolol succinate (TOPROL-XL) 100 MG 24 hr tablet Take 100 mg by mouth daily.  08/22/15  Yes Historical Provider, MD  Oxycodone HCl 10 MG TABS Take 1 tablet (10 mg total) by mouth every 4 (four) hours as needed. Patient taking differently: Take 10 mg by mouth 2 (two) times daily as needed (FOR PAIN).  08/24/15  Yes Patrici Ranks, MD  pantoprazole (PROTONIX) 40 MG tablet Take 1 tablet (40 mg total) by mouth 2 (two) times daily. Patient taking differently: Take 40 mg by mouth daily.  06/05/15  Yes Reyne Dumas, MD  sucralfate (CARAFATE) 1 g tablet Take 1 g by mouth 3 (three) times daily.   Yes Historical Provider, MD  ciprofloxacin (CIPRO) 500 MG tablet Take 500 mg by mouth 2 (two) times daily. [place] ON 08/22/2015-COMPLETED COURSE 08/22/15   Historical Provider, MD  metroNIDAZOLE (FLAGYL) 500 MG tablet Take 1,000 mg by mouth 2 (two) times daily. [place] ON 08/22/2015-COMPLETED 08/22/15   Historical Provider, MD    Scheduled Meds: . sodium chloride   Intravenous Once  . famotidine (PEPCID) IV  20 mg Intravenous Q12H  . sodium chloride  flush  3 mL Intravenous Q12H  . sodium chloride flush  3 mL Intravenous Q12H   Infusions:   PRN Meds: sodium chloride, morphine injection, ondansetron **OR** ondansetron (ZOFRAN) IV, sodium chloride flush   Allergies as of 08/29/2015 - Review Complete 08/29/2015  Allergen Reaction Noted  . Doxycycline Itching 03/21/2015  . Colestipol Itching 09/14/2014  . Doxycycline Rash 09/05/2014    Family History  Problem Relation Age of Onset  . Stomach cancer Father 34    deceased  . Colon cancer Neg Hx     Social History   Social History  . Marital status: Married    Spouse name: N/A  . Number of children: 1  . Years of education: N/A   Occupational History  . truck driver Retired   Social History Main Topics  . Smoking status: Never Smoker  . Smokeless tobacco: Never Used     Comment: Never smoked much  . Alcohol use No     Comment: former drinks about a pint of borboun per week  . Drug use: No  . Sexual activity: Yes    Partners: Female    Birth control/ protection: None   Other Topics Concern  . Not on file   Social History Narrative  . No narrative on file    REVIEW OF SYSTEMS: Constitutional:  No profound weakness, fatigue when A fib occurs ENT:  No nose bleeds Pulm:  No cough or DOE escept if in A fib CV:  Slight left LE edema.  Occasional bouts of a fib.  No chest pain.   GU:  No hematuria, no frequency GI:  Per HPI.  No dysphagia Heme:  Per HPI.  No unusual bleeding or bruising except as per HPI   Transfusions:  Per HPI Neuro:  No headaches, no peripheral tingling or numbness Derm:  No itching, no rash or sores.  Endocrine:  No sweats or chills.  No polyuria or dysuria Immunization:  Not queried Travel:  None beyond local counties in last few months.    PHYSICAL EXAM: Vital signs in last 24  hours: Vitals:   08/29/15 2152 08/30/15 0649  BP: (!) 112/51 109/60  Pulse: 83 87  Resp: 18   Temp: 98.2 F (36.8 C) 97.3 F (36.3 C)   Wt Readings from  Last 3 Encounters:  08/30/15 71.3 kg (157 lb 3.2 oz)  08/24/15 74.8 kg (165 lb)  08/11/15 71.2 kg (157 lb)    General: pleasant, pale but not terminally ill looking Head:  No asymmetry or swelling  Eyes:  No icterus or pallor Ears:  Not HOH  Nose:  No congestion or discharge Mouth:  Clear and moist Neck:  No mass.  No JVD Lungs:  Clear bil.  No cough or dyspnea.  Heart: RRR.  No MRG Abdomen:  Soft, NT, ND.  No HSM or mass.  Active BS.   Rectal: deferred   Musc/Skeltl: no joint erythema or swelling Extremities:  Feet are warm, well perfused.   Slight, non-pitting edema in left ankle  Neurologic:  Oriented x 3.  No tremor.  Moves all 4 limbs easily strength not tested.  Skin:  No rash, sores or telangectasia  Psych:  Calm  Pleasant.  Cooperative.    Intake/Output from previous day: 08/08 0701 - 08/09 0700 In: 1223 [I.V.:503; Blood:670; IV Piggyback:50] Out: 1000 [Urine:1000] Intake/Output this shift: No intake/output data recorded.  LAB RESULTS:  Recent Labs  08/29/15 1650 08/30/15 0137  WBC 13.6* 10.7*  HGB 7.7* 8.2*  HCT 23.1* 24.6*  PLT 516* 331   BMET Lab Results  Component Value Date   NA 140 08/30/2015   NA 134 (L) 08/29/2015   NA 137 08/11/2015   K 4.3 08/30/2015   K 3.9 08/29/2015   K 4.1 08/11/2015   CL 114 (H) 08/30/2015   CL 109 08/29/2015   CL 107 08/11/2015   CO2 21 (L) 08/30/2015   CO2 20 (L) 08/29/2015   CO2 26 08/11/2015   GLUCOSE 102 (H) 08/30/2015   GLUCOSE 121 (H) 08/29/2015   GLUCOSE 153 (H) 08/11/2015   BUN 18 08/30/2015   BUN 23 (H) 08/29/2015   BUN 27 (H) 08/11/2015   CREATININE 1.98 (H) 08/30/2015   CREATININE 2.22 (H) 08/29/2015   CREATININE 1.51 (H) 08/11/2015   CALCIUM 8.2 (L) 08/30/2015   CALCIUM 7.9 (L) 08/29/2015   CALCIUM 9.3 08/11/2015   LFT  Recent Labs  08/29/15 1650  PROT 6.3*  ALBUMIN 2.4*  AST 51*  ALT 32  ALKPHOS 103  BILITOT 0.4   PT/INR Lab Results  Component Value Date   INR 1.40 08/30/2015    INR 1.31 08/29/2015   INR 1.28 06/03/2015   Lipase     Component Value Date/Time   LIPASE 25 08/29/2015 1650     RADIOLOGY STUDIES: Dg Chest Portable 1 View  Result Date: 08/29/2015 CLINICAL DATA:  GI bleed. Patient has pancreatic carcinoma. Some shortness of breath. EXAM: PORTABLE CHEST 1 VIEW COMPARISON:  06/01/2015 FINDINGS: Cardiac silhouette is normal in size. No mediastinal or hilar masses or convincing adenopathy. Left anterior chest wall dual lumen Port-A-Cath is stable with its catheter tip near the caval atrial junction. There are prominent bronchovascular markings. Minor scarring or atelectasis noted at the right lateral lung base. Lungs otherwise clear. No pleural effusion or pneumothorax. Bony thorax is demineralized but grossly intact. IMPRESSION: No acute cardiopulmonary disease. Electronically Signed   By: Lajean Manes M.D.   On: 08/29/2015 16:08    ENDOSCOPIC STUDIES: Per HPI  IMPRESSION:   *  Recurrent GI bleed in pt s/p  embolization of GDA 06/03/15 after EGD showed large DU with VV.  BP low but pt not symptomatic.   *  Blood loss anemia. S/p 1 of 2 ordered PRBCs.  Got 1 dose of Protonix 40 mg and now on Pepcid drip  *  Metastatic pancreatic adenocarcinoma.  Did not tolerate palliative chemo  *  Afib with RVR onset 07/2015.  Not on anticoagulation due to hx GI bleed. Marland Kitchen    PLAN:     *  Repeat EGD?     William Rivera  08/30/2015, 10:26 AM Pager: [phone]      [facility] Attending   I have taken an interval history, reviewed the chart and examined the patient. I agree with the Advanced Practitioner's note, impression and recommendations.     EGD to determine cause of bleeding seems sensible to me and patient agrees. The risks and benefits as well as alternatives of endoscopic procedure(s) have been discussed and reviewed. All questions answered. The patient agrees to proceed.  Gatha Mayer, MD, [facility] [phone]  (pager) [phone] after 5 PM, weekends and holidays  08/30/2015 3:51 PM

## 2015-08-30 NOTE — Sedation Documentation (Signed)
Pt doing well, denies pain

## 2015-08-30 NOTE — Significant Event (Signed)
Rapid Response Event Note  Overview:  Called to Endo with active bleeding patient Time Called: 1634 Arrival Time: 1635 Event Type: Hypotension, Other (Comment) (active bleeding with endo)  Initial Focused Assessment:  Lying on stretcher - pale - arouses post sedation for procedure - HR 109 - Bp 112/59 RR 21  O2 sats 100% on nasal cannula.  Denies pain or SOB.    Interventions:  NS bolus - assist with transfer to ICU - patient remains stable at this point.  #18 angio cath inserted right antecubital -to NSL.  Transfer to 2S09 with Joellen Jersey RN.  Support to  family -  Dr. Earleen Newport in - permit signed.  Handoff to [place].   Plan of Care (if not transferred):  Event Summary: Name of Physician Notified: Dr. Carlean Purl at  (present with procedure)  Name of Consulting Physician Notified: Dr. Damita Dunnings - IR       Dr. Leonia Corona PCCM at  (per Dr. Carlean Purl)  Outcome: Transferred (Comment) (to 2S09)  Event End Time: 1715  Quin Hoop

## 2015-08-30 NOTE — Progress Notes (Signed)
eLink Physician-Brief Progress Note Patient Name: William Rivera DOB: [birth year] MRN: [id number]   Date of Service  08/30/2015  HPI/Events of Note  Elevated troponin 0.4  eICU Interventions  Earlier episode of SVT, s/p adenosine, cont to monitor.      Intervention Category Intermediate Interventions: Electrolyte abnormality - evaluation and management  Legion Discher 08/30/2015, 11:45 PM

## 2015-08-30 NOTE — Progress Notes (Signed)
Rapid RN called to assess pt in IR 1, sats 70's, BP low, pt alert and talking, HR 180's, SVT. Adenosine 6mg  given in IR 1 by Rapid RN. Pts HR responded well; procedure in process, blood infusing

## 2015-08-30 NOTE — Progress Notes (Signed)
Paged Schorr,NP about patient's blood transfusion orders. Patient only received 1 of the 2 units of blood ordered. Schorr,NP returned page by cancelling orders for second unit of blood and stating that she was cancelling the orders for the second blood transfusion due to the patient's hemoglobin increasing from 7.7 to 8.2. Will continue to monitor and treat per MD orders.

## 2015-08-30 NOTE — Consult Note (Signed)
Chief Complaint: Melena  Referring Physician(s): Dr. Carlean Purl  Supervising Physician: Corrie Mckusick  Patient Status: Inpatient  History of Present Illness: William Rivera is a 80 y.o. male referred by Dr. Carlean Purl for evaluation of upper GI bleeding.  Dr. Carlean Purl completed his upper endoscopy today and observed significant bleeding at a site in the duodenum.  The endoscopy team noted tachycardia and hypotension during the endoscopy.    William Rivera has known pancreatic cancer. He has a history of prior GI bleeding with an embolization of the GDA performed in May 2017.    Because of his hemodynamic instability and endoscopy findings, he has been referred for evaluation for angiogram and possible embolization.       Past Medical History:  Diagnosis Date  . Adenomatous colon polyp 2010  . Barrett's esophagus    last EGD/Bx 12/11  . Cancer of pancreas, other site AUG 2016   CA 19-9 958.6  . GASTRITIS 09/23/2008   Qualifier: Diagnosis of  By: Nicole Kindred LPN, Doris    . GERD (gastroesophageal reflux disease)   . Gout   . Helicobacter pylori gastritis 2000   s/p treatment  . Hypertension   . Pancreatic cancer ([place])   . Pancreatic cancer ([place])   . Paroxysmal SVT (supraventricular tachycardia) ([place]) 08/30/2015    Past Surgical History:  Procedure Laterality Date  . BILIARY STENT PLACEMENT N/A 09/14/2014   Procedure: BILIARY STENT PLACEMENT;  Surgeon: Rogene Houston, MD;  Location: [facility];  Service: Endoscopy;  Laterality: N/A;  . BLADDER SURGERY  [id number]  . COLONOSCOPY  2010   simple adenomas  . COLONOSCOPY N/A 07/28/2013   Dr. Oneida Alar: tubular adenoma, mild sigmoid colon diverticulosis, internal hemorhhoids   . DOPPLER ECHOCARDIOGRAPHY  2009  . ERCP N/A 09/14/2014   Dr. Laural Golden: sphincterotomy with 48 F plastic biliary stent placement   . ERCP N/A 01/04/2015   Procedure: ATTEMPTED ENDOSCOPIC RETROGRADE CHOLANGIOPANCREATOGRAPHY (ERCP);  Surgeon: Daneil Dolin, MD;  Location: [facility];   Service: Endoscopy;  Laterality: N/A;  . ERCP  02/2015   [facility]: biliary stent exchange from plastic to metal stent  . ESOPHAGOGASTRODUODENOSCOPY  2008   DUODENITIS & GASTRITIS 2o TO NSAIDS/ETOH  . ESOPHAGOGASTRODUODENOSCOPY  12/2009   short segment Barrett's, small hh, chronic gastritis  . ESOPHAGOGASTRODUODENOSCOPY  01/04/2011   [id number] gastritis/Barrett's, possible  . ESOPHAGOGASTRODUODENOSCOPY N/A 11/30/2012   Dr. Oneida Alar- normal esophagus, empiric dilation d/t c/o dysphagia/?cervical web, stomach= mild non erosive gastritis, inflammation on bx, duodenum= no abnormalities in the bulb and second portion of the duodenum. dilation at the gastroesphageal junction.  . ESOPHAGOGASTRODUODENOSCOPY N/A 06/02/2015   SLF: 1. normal esophagus 2. gastrtitis mild 3. GI bleed due to large duodenal ulcer with large visible vessel, high risk to rebleed without prevention   . ESOPHAGOGASTRODUODENOSCOPY (EGD) WITH PROPOFOL N/A 01/04/2015   RMR: partial uoddenal obstruction at junction of 1st and 2nd portion of duodenum, unale to intubate 2nd portion of duodenum to perform ERCP  . HEMORRHOID SURGERY    . MALONEY DILATION N/A 11/30/2012   Procedure: MALONEY DILATION;  Surgeon: Danie Binder, MD;  Location: [facility];  Service: Endoscopy;  Laterality: N/A;  . NASAL SEPTUM SURGERY    . NECK SURGERY     DISC REPLACED  . NM MYOVIEW LTD  2009  . REPLACEMENT TOTAL KNEE Right 11/2010  . SAVORY DILATION N/A 11/30/2012   Procedure: SAVORY DILATION;  Surgeon: Danie Binder, MD;  Location: [facility];  Service: Endoscopy;  Laterality: N/A;  . SINUS EXPLORATION  2000  . SPHINCTEROTOMY N/A 09/14/2014   Procedure: SPHINCTEROTOMY;  Surgeon: Rogene Houston, MD;  Location: [facility];  Service: Endoscopy;  Laterality: N/A;  . stents in bile ducts    . TONSILLECTOMY     AS A CHILD    Allergies: Doxycycline; Colestipol; and Doxycycline  Medications: Prior to Admission medications   Medication Sig Start Date End  Date Taking? Authorizing Provider  feeding supplement (BOOST / RESOURCE BREEZE) LIQD Take 1 Container by mouth 3 (three) times daily with meals. 06/05/15  Yes Reyne Dumas, MD  GAS RELIEF 125 MG CAPS TAKE ONE TO TWO SOFTGELS BY MOUTH DAILY AS NEEDED FOR FLATULENCE 06/02/15  Yes Historical Provider, MD  hydrocortisone (ANUSOL-HC) 2.5 % rectal cream Place 1 application rectally 2 (two) times daily. 08/28/15  Yes Orvil Feil, NP  metoprolol succinate (TOPROL-XL) 100 MG 24 hr tablet Take 100 mg by mouth daily.  08/22/15  Yes Historical Provider, MD  Oxycodone HCl 10 MG TABS Take 1 tablet (10 mg total) by mouth every 4 (four) hours as needed. Patient taking differently: Take 10 mg by mouth 2 (two) times daily as needed (FOR PAIN).  08/24/15  Yes Patrici Ranks, MD  pantoprazole (PROTONIX) 40 MG tablet Take 1 tablet (40 mg total) by mouth 2 (two) times daily. Patient taking differently: Take 40 mg by mouth daily.  06/05/15  Yes Reyne Dumas, MD  sucralfate (CARAFATE) 1 g tablet Take 1 g by mouth 3 (three) times daily.   Yes Historical Provider, MD  ciprofloxacin (CIPRO) 500 MG tablet Take 500 mg by mouth 2 (two) times daily. [place] ON 08/22/2015-COMPLETED COURSE 08/22/15   Historical Provider, MD  metroNIDAZOLE (FLAGYL) 500 MG tablet Take 1,000 mg by mouth 2 (two) times daily. [place] ON 08/22/2015-COMPLETED 08/22/15   Historical Provider, MD     Family History  Problem Relation Age of Onset  . Stomach cancer Father 47    deceased  . Colon cancer Neg Hx     Social History   Social History  . Marital status: Married    Spouse name: N/A  . Number of children: 1  . Years of education: N/A   Occupational History  . truck driver Retired   Social History Main Topics  . Smoking status: Never Smoker  . Smokeless tobacco: Never Used     Comment: Never smoked much  . Alcohol use No     Comment: former drinks about a pint of borboun per week  . Drug use: No  . Sexual activity:  Yes    Partners: Female    Birth control/ protection: None   Other Topics Concern  . None   Social History Narrative  . None    Review of Systems: A 12 point ROS discussed and pertinent positives are indicated in the HPI above.  All other systems are negative.  Review of Systems  Vital Signs: BP 118/65   Pulse 100   Temp 98 F (36.7 C) (Oral)   Resp 18   Ht 5\' 9"  (1.753 m)   Wt 157 lb 3.2 oz (71.3 kg)   SpO2 98% Comment: 2L/[place]  BMI 23.21 kg/m   Physical Exam  Mallampati Score:  MD Evaluation Heart: Other (comments) Heart  comments: mild tachycardia after adenosine Abdomen: WNL Chest/ Lungs: WNL ASA  Classification: 4 Mallampati/Airway Score: Two  Imaging: Ct Abdomen Pelvis Wo Contrast  Result Date: 08/11/2015 CLINICAL DATA:  Pancreatic cancer. EXAM: CT ABDOMEN AND PELVIS WITHOUT CONTRAST TECHNIQUE: Multidetector CT imaging of the abdomen and pelvis was performed following the standard protocol without IV contrast. COMPARISON:  06/01/2015 FINDINGS: Lower chest: Innumerable bilateral pulmonary nodules are again noted. They are more conspicuous and appear slightly progressed in the interval. 4 mm posterior right lower lobe pulmonary nodule seen on previous study is now 6 mm. 9 mm subpleural nodule measured on the previous study is now 15 mm. Hepatobiliary: Pneumobilia again noted in the left hepatic lobe. No change in the probable cyst along the gallbladder fossa. Gallbladder unremarkable. Common bile duct stent remains in place. Pancreas: Discrete pancreatic head mass not apparent on this study without intravenous contrast and measurements on previous study included the duodenum. Overall, imaging appearance at the level of the pancreatic head does not appear substantially changed. Patient is noted to have new embolization coils in the gastroduodenal artery. Spleen: No splenomegaly. No focal mass lesion. Adrenals/Urinary Tract: No adrenal nodule or mass. Lymph kidney is  markedly scarred and atrophic. Cortical scarring is seen in the right kidney. No hydronephrosis on either side. No evidence for hydroureter. The urinary bladder appears normal for the degree of distention. Stomach/Bowel: Stomach is mildly distended. Duodenum is nondistended. No small bowel wall thickening. No small bowel dilatation. The terminal ileum is normal. The appendix is normal. No gross colonic mass. No colonic wall thickening. No substantial diverticular change. Vascular/Lymphatic: There is abdominal aortic atherosclerosis without aneurysm. Haziness/ edema in the root of the small bowel mesentery is stable to slightly progressed in the interval. No bulky abdominal lymphadenopathy. No pelvic sidewall lymphadenopathy. Reproductive: The prostate gland and seminal vesicles have normal imaging features. Other: No intraperitoneal free fluid. Musculoskeletal: Bone windows reveal no worrisome lytic or sclerotic osseous lesions. IMPRESSION: 1. No substantial change in pancreatic head mass. 2. Innumerable tiny bilateral pulmonary nodules appear progressed in the interval and some of the larger, more discrete nodules measured previously have progressed. 3. Interval embolization of the gastroduodenal artery. Electronically Signed   By: Misty Stanley M.D.   On: 08/11/2015 09:41   Dg Chest Portable 1 View  Result Date: 08/29/2015 CLINICAL DATA:  GI bleed. Patient has pancreatic carcinoma. Some shortness of breath. EXAM: PORTABLE CHEST 1 VIEW COMPARISON:  06/01/2015 FINDINGS: Cardiac silhouette is normal in size. No mediastinal or hilar masses or convincing adenopathy. Left anterior chest wall dual lumen Port-A-Cath is stable with its catheter tip near the caval atrial junction. There are prominent bronchovascular markings. Minor scarring or atelectasis noted at the right lateral lung base. Lungs otherwise clear. No pleural effusion or pneumothorax. Bony thorax is demineralized but grossly intact. IMPRESSION: No acute  cardiopulmonary disease. Electronically Signed   By: Lajean Manes M.D.   On: 08/29/2015 16:08    Labs:  CBC:  Recent Labs  06/06/15 0955 08/11/15 0714 08/29/15 1650 08/30/15 0137  WBC 5.2 8.4 13.6* 10.7*  HGB 10.3* 12.3* 7.7* 8.2*  HCT 29.8* 35.2* 23.1* 24.6*  PLT 162 219 516* 331    COAGS:  Recent Labs  06/02/15 0311 06/03/15 1335 08/29/15 1650 08/30/15 0137  INR 1.33 1.28 1.31 1.40  APTT 31  --   --   --     BMP:  Recent Labs  06/05/15 0630 08/11/15 0714 08/29/15 1650 08/30/15 0137  NA 138 137 134* 140  K 4.2 4.1 3.9 4.3  CL 110 107 109 114*  CO2 24 26 20* 21*  GLUCOSE 111* 153* 121* 102*  BUN 6 27* 23*  18  CALCIUM 8.3* 9.3 7.9* 8.2*  CREATININE 1.21 1.51* 2.22* 1.98*  GFRNONAA 56* 43* 27* 31*  GFRAA >60 50* 31* 36*    LIVER FUNCTION TESTS:  Recent Labs  06/04/15 0600 06/05/15 0630 08/11/15 0714 08/29/15 1650  BILITOT 0.7 0.6 0.4 0.4  AST 17 18 26  51*  ALT 14* 12* 30 32  ALKPHOS 89 95 126 103  PROT 4.6* 4.9* 7.6 6.3*  ALBUMIN 2.1* 2.2* 3.8 2.4*    TUMOR MARKERS:  Recent Labs  09/15/14 0504  CA199 695*    Assessment and Plan:  80 year old male with a history of acute upper GI bleeding with hemodynamic instability.    Angiogram and embolization is indicated.   I have discussed the procedure and risks with both the patient and his wife.  Specific risks include: bleeding, infection, renal injury, arterial injury, need for further procedure or surgery, contrast reaction, cardiopulmonary collapse, death.  They understand his increased risk of renal injury given his current creatinine at just below 2.0.    We will plan on emergent angiogram.    Thank you for this interesting consult.  I greatly enjoyed meeting William Rivera and look forward to participating in their care.  A copy of this report was sent to the requesting provider on this date.  Electronically Signed: Corrie Mckusick 08/30/2015, 5:59 PM   I spent a total of 55 [place]     in face to face in clinical consultation, greater than 50% of which was counseling/coordinating care for acute GI bleeding, with possible angiogram/embolization.

## 2015-08-30 NOTE — Procedures (Signed)
Interventional Radiology Procedure Note  Procedure:  Mesenteric angiogram via R CFA access, for acute upper GI bleeding.    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    2 x 6mm coil to embolize vessel, with no flow to the territory at the conclusion.   US guided R CFV triple lumen for large-bore access/fluids  Complications: None Recommendations:  - 4 hours right hip straight - Fluid hydration for renal protection - frequent neurovascular checks, with access site checks - follow H&H - Do not submerge for 7 days - Routine wound care   Signed,  Dulcy Fanny. Earleen Newport, DO

## 2015-08-30 NOTE — Progress Notes (Signed)
PROGRESS NOTE  William Rivera [id number] DOB: 02/05/1935 DOA: 08/29/2015 PCP: [name], MD  HPI/Recap of past 24 hours:  Transferred to ICU after EGD, currently heart rate better, family in room  Assessment/Plan: Principal Problem:   GI bleed Active Problems:   Essential hypertension   BARRETTS ESOPHAGUS   Adenocarcinoma of pancreas ([place])   Acute blood loss anemia   AKI (acute kidney injury) ([place])   Duodenal ulcer with hemorrhage  Gi bleed, concerning for arterial bleed, getting prbc transfusion, ppi drip, IR consulted for possible embolization, high risk of decompensation due to significant arterial blood , patient is being transferred to ICU care.  Acute blood loss anemia: getting prbc transfusion  Paroxysmal SVT; s/p adenosine   Aki; cr 2.22 on admission, 1.98 the next morning, repeat in am, renal dosing meds, ua noinfection  Metastatic pancreatic cancer, not able to tolerate chemo, s/p stenting, over all poor prognosis, patient is DNR, and is in the process of getting hospice involved, but does want to GI bleed treated if possible.  Code Status: DNR  Family Communication: patient and family  Disposition Plan: transferred to icu care   Consultants:  GI  Procedures:  prbc transfusionx1 unit on 8/8  Antibiotics:  none   Objective: BP 109/60 (BP Location: Left Arm)   Pulse 87   Temp 97.3 F (36.3 C)   Resp 18   Ht 5\' 9"  (1.753 m)   Wt 71.3 kg (157 lb 3.2 oz)   SpO2 97%   BMI 23.21 kg/m   Intake/Output Summary (Last 24 hours) at 08/30/15 1403 Last data filed at 08/30/15 0923  Gross per 24 hour  Intake             1223 ml  Output             1000 ml  Net              223 ml   Filed Weights   08/29/15 1535 08/29/15 2200 08/30/15 0700  Weight: 70.3 kg (155 lb) 75.2 kg (165 lb 11.2 oz) 71.3 kg (157 lb 3.2 oz)    Exam:   General:  Pale currently NAD  Cardiovascular: RRR  Respiratory: CTABL  Abdomen: Soft/ND/NT, positive  BS  Musculoskeletal: No Edema  Neuro: aaox3  Data Reviewed: Basic Metabolic Panel:  Recent Labs Lab 08/29/15 1650 08/30/15 0137  NA 134* 140  K 3.9 4.3  CL 109 114*  CO2 20* 21*  GLUCOSE 121* 102*  BUN 23* 18  CREATININE 2.22* 1.98*  CALCIUM 7.9* 8.2*   Liver Function Tests:  Recent Labs Lab 08/29/15 1650  AST 51*  ALT 32  ALKPHOS 103  BILITOT 0.4  PROT 6.3*  ALBUMIN 2.4*    Recent Labs Lab 08/29/15 1650  LIPASE 25   No results for input(s): AMMONIA in the last 168 hours. CBC:  Recent Labs Lab 08/29/15 1650 08/30/15 0137  WBC 13.6* 10.7*  NEUTROABS 11.6*  --   HGB 7.7* 8.2*  HCT 23.1* 24.6*  MCV 92.4 92.5  PLT 516* 331   Cardiac Enzymes:   No results for input(s): CKTOTAL, CKMB, CKMBINDEX, TROPONINI in the last 168 hours. BNP (last 3 results) No results for input(s): BNP in the last 8760 hours.  ProBNP (last 3 results) No results for input(s): PROBNP in the last 8760 hours.  CBG: No results for input(s): GLUCAP in the last 168 hours.  No results found for this or any previous visit (from the past  240 hour(s)).   Studies: Dg Chest Portable 1 View  Result Date: 08/29/2015 CLINICAL DATA:  GI bleed. Patient has pancreatic carcinoma. Some shortness of breath. EXAM: PORTABLE CHEST 1 VIEW COMPARISON:  06/01/2015 FINDINGS: Cardiac silhouette is normal in size. No mediastinal or hilar masses or convincing adenopathy. Left anterior chest wall dual lumen Port-A-Cath is stable with its catheter tip near the caval atrial junction. There are prominent bronchovascular markings. Minor scarring or atelectasis noted at the right lateral lung base. Lungs otherwise clear. No pleural effusion or pneumothorax. Bony thorax is demineralized but grossly intact. IMPRESSION: No acute cardiopulmonary disease. Electronically Signed   By: Lajean Manes M.D.   On: 08/29/2015 16:08    Scheduled Meds: . sodium chloride   Intravenous Once  . famotidine (PEPCID) IV  20 mg  Intravenous Q12H  . sodium chloride flush  3 mL Intravenous Q12H  . sodium chloride flush  3 mL Intravenous Q12H    Continuous Infusions:    Time spent: 44mins  Rhianon Zabawa MD, PhD  [facility] Pager [phone]. If 7PM-7AM, please contact night-coverage at [email or web address], password [facility] 08/30/2015, 2:03 PM  LOS: 1 day

## 2015-08-30 NOTE — Sedation Documentation (Signed)
1st unit blood infused

## 2015-08-30 NOTE — Consult Note (Addendum)
PULMONARY / CRITICAL CARE MEDICINE   Name: William Rivera MRN: [id number] DOB: 22-Jan-1935    ADMISSION DATE:  08/29/2015 CONSULTATION DATE:  8/9  REFERRING MD:  Dr. Erlinda Hong  CHIEF COMPLAINT:  GI bleed  HISTORY OF PRESENT ILLNESS: 80 year old male with past medical history as below, which is significant for pancreatic cancer, Barrett's esophagus, GERD, and GI bleed requiring embolization. Pancreatic cancer is adenocarcinoma which was initially diagnosed in August 2016. He did not have resection in the setting of metastasis to the lungs. He was unable to tolerate chemotherapy and intraperitoneal catheter so further treatment was aborted. Hospice has been becoming involved. He presented to [facility] emergency department with complaints of hematochezia 1 day. Initially started as maroon colored stools but has progressed and melena. Upon presentation to the emergency department his vital signs are stable, however, there had been a notable drop in his hemoglobin over the past couple of months from 12-7.7. He was admitted by the hospitalist team and was treated in the usual fashion with Protonix, transfusion of 2 units packed red blood cells, and gastroenterology consultation. 8/9 he was taken for upper endoscopy, however, prior to procedure he was noted to be profound tachycardic and hypotensive and what appeared to be SVT which was converted to sinus rhythm with 6 adenosine. Upper endoscopy demonstrated hemorrhaging duodenal ulcer which was unable to be abated endoscopically. He was emergently transferred to ICU with emergent interventional radiology consultation. He was taken to IR and underwent embolization, he is returned to ICU for recovery.   PAST MEDICAL HISTORY :  He  has a past medical history of Adenomatous colon polyp (2010); Barrett's esophagus; Cancer of pancreas, other site (AUG 2016); GASTRITIS (09/23/2008); GERD (gastroesophageal reflux disease); Gout; Helicobacter pylori gastritis (2000);  Hypertension; Pancreatic cancer ([place]); Pancreatic cancer [facility]); and Paroxysmal SVT (supraventricular tachycardia) ([place]) (08/30/2015).  PAST SURGICAL HISTORY: He  has a past surgical history that includes Esophagogastroduodenoscopy (2008); Colonoscopy (2010); Neck surgery; Bladder surgery [id number]); Sinus exploration (2000); Tonsillectomy; Esophagogastroduodenoscopy (12/2009); Nasal septum surgery; Esophagogastroduodenoscopy (01/04/2011); Esophagogastroduodenoscopy (N/A, 11/30/2012); Savory dilation (N/A, 11/30/2012); maloney dilation (N/A, 11/30/2012); Replacement total knee (Right, 11/2010); Colonoscopy (N/A, 07/28/2013); doppler echocardiography (2009); NM MYOVIEW LTD (2009); ERCP (N/A, 09/14/2014); biliary stent placement (N/A, 09/14/2014); sphincterotomy (N/A, 09/14/2014); Esophagogastroduodenoscopy (egd) with propofol (N/A, 01/04/2015); ERCP (N/A, 01/04/2015); stents in bile ducts; Hemorrhoid surgery; ERCP (02/2015); and Esophagogastroduodenoscopy (N/A, 06/02/2015).  Allergies  Allergen Reactions  . Doxycycline Itching    Severe itching   . Colestipol Itching  . Doxycycline Rash    No current facility-administered medications on file prior to encounter.    Current Outpatient Prescriptions on File Prior to Encounter  Medication Sig  . feeding supplement (BOOST / RESOURCE BREEZE) LIQD Take 1 Container by mouth 3 (three) times daily with meals.  Marland Kitchen GAS RELIEF 125 MG CAPS TAKE ONE TO TWO SOFTGELS BY MOUTH DAILY AS NEEDED FOR FLATULENCE  . hydrocortisone (ANUSOL-HC) 2.5 % rectal cream Place 1 application rectally 2 (two) times daily.  . metoprolol succinate (TOPROL-XL) 100 MG 24 hr tablet Take 100 mg by mouth daily.   . Oxycodone HCl 10 MG TABS Take 1 tablet (10 mg total) by mouth every 4 (four) hours as needed. (Patient taking differently: Take 10 mg by mouth 2 (two) times daily as needed (FOR PAIN). )  . pantoprazole (PROTONIX) 40 MG tablet Take 1 tablet (40 mg total) by mouth 2 (two) times daily.  (Patient taking differently: Take 40 mg by mouth daily. )  . sucralfate (CARAFATE)  1 g tablet Take 1 g by mouth 3 (three) times daily.    FAMILY HISTORY:  His indicated that his mother is deceased. He indicated that his father is deceased. He indicated that the status of his neg hx is unknown.    SOCIAL HISTORY: He  reports that he has never smoked. He has never used smokeless tobacco. He reports that he does not drink alcohol or use drugs.  REVIEW OF SYSTEMS:   Review of Systems  Constitutional: Positive for malaise/fatigue and weight loss. Negative for chills and fever.  HENT: Negative for ear discharge, ear pain, hearing loss and tinnitus.   Eyes: Negative for blurred vision, double vision and photophobia.  Respiratory: Negative for cough, hemoptysis and sputum production.   Cardiovascular: Negative for chest pain, palpitations and claudication.  Gastrointestinal: Positive for abdominal pain and nausea. Negative for heartburn and vomiting.  Genitourinary: Negative for dysuria and urgency.  Musculoskeletal: Negative for myalgias and neck pain.  Skin: Negative for rash.  Neurological: Negative for headaches.     SUBJECTIVE: mild abdo pain reported   VITAL SIGNS: BP 118/65   Pulse 100   Temp 98 F (36.7 C) (Oral)   Resp 20   Ht 5\' 9"  (1.753 m)   Wt 71.3 kg (157 lb 3.2 oz)   SpO2 98%   BMI 23.21 kg/m   HEMODYNAMICS:    VENTILATOR SETTINGS:    INTAKE / OUTPUT: I/O last 3 completed shifts: In: 1223 [I.V.:503; Blood:670; IV Piggyback:50] Out: 1000 [Urine:1000]  PHYSICAL EXAMINATION: General:  Awake, alert Neuro:  Nonfocal, perrl HEENT:  jvd down, no stridor Cardiovascular:  s1 s2 RR mild tachy, not in svt currently Lungs:  CTA to bases Abdomen:  Sof, BS low, no r/g, no tenderness to palpation Musculoskeletal:  No edema Skin:  No rash  LABS:  BMET  Recent Labs Lab 08/29/15 1650 08/30/15 0137  NA 134* 140  K 3.9 4.3  CL 109 114*  CO2 20* 21*  BUN  23* 18  CREATININE 2.22* 1.98*  GLUCOSE 121* 102*    Electrolytes  Recent Labs Lab 08/29/15 1650 08/30/15 0137  CALCIUM 7.9* 8.2*    CBC  Recent Labs Lab 08/29/15 1650 08/30/15 0137  WBC 13.6* 10.7*  HGB 7.7* 8.2*  HCT 23.1* 24.6*  PLT 516* 331    Coag's  Recent Labs Lab 08/29/15 1650 08/30/15 0137  INR 1.31 1.40    Sepsis Markers  Recent Labs Lab 08/29/15 1650  LATICACIDVEN 2.2*    ABG No results for input(s): PHART, PCO2ART, PO2ART in the last 168 hours.  Liver Enzymes  Recent Labs Lab 08/29/15 1650  AST 51*  ALT 32  ALKPHOS 103  BILITOT 0.4  ALBUMIN 2.4*    Cardiac Enzymes No results for input(s): TROPONINI, PROBNP in the last 168 hours.  Glucose No results for input(s): GLUCAP in the last 168 hours.  Imaging No results found.   STUDIES:  06/02/2015 EGD for melena, hematochezia.  Mild gastritis, large DU with VV but not bleeding: no hemostatic therapy.  Unable to pass scope beyond bulb due to presence of stent.   06/03/2015 IR embolization of gastroduodenal artery 08/30/2015  EGD, duodenal ulcer hemorrhage  CULTURES:   ANTIBIOTICS:   SIGNIFICANT EVENTS: 8/8 admit for GIB 8/9 EGD with hemorrhaging duodenal ulcer, taken to OR for embolization  LINES/TUBES: R femoral CVL 8/9 >   DISCUSSION: 80 year old male with PMH as below, which includes pancreatic adeno and GIB. He has been transitioning to  hospice care for Ca. Developed GI bleeding 8/8 with progressed 8/9. EGD 8/9 with duodenal hemorrhage. To IR.   ASSESSMENT / PLAN:  PULMONARY A: Hypoxemia- not well explained  P:   May not truly need 100% NRB, but has it on now in IR Will further assess once arrives to ICU  CARDIOVASCULAR A:  Hemorrhagic shock SVT > converted with 6mg  adenosine, likely in setting of above H/o HTN  P: Telemetry MAP goal > 70mmHg Blood product administration as indicated Assess lactic acid, troponin May require pressors  Hold  metoprolol  RENAL A:   Acute renal failure Chronic kidney disease  P:   Repeat BMP  GASTROINTESTINAL A:   Duodenal hemorrhage Pancreatic Cancer  > Failed/did not tolerate tx, transitioning to hospice.   P:   To IR now for hopeful embolization NPO Protonix infusion, deescalate when able in setting of shortage.  GI following  HEMATOLOGIC A:   ABLA secondary to GI hemorrhage  P:  Serial CBC Transfuse PRBC for HGB < 7 (2 units transfusing now) SCDs Get coags  INFECTIOUS A:   No acute issues  P:   Monitor WBC and fever curve  ENDOCRINE A:   No acute issues  P:   Follow glucose on BMP  NEUROLOGIC A:   No acute issues  P:   Monitor   FAMILY  - Updates: patient updated 8/9 by [place] family meet or Palliative Care meeting due by: 8/15    Georgann Housekeeper, AGACNP-BC [place] [facility] Pager [phone] or [phone]  08/30/2015 6:47 PM   STAFF NOTE: Linwood Dibbles, MD FACP have personally reviewed patient's available data, including medical history, events of note, physical examination and test results as part of my evaluation. I have discussed with resident/NP and other care providers such as pharmacist, RN and RRT. In addition, I personally evaluated patient and elicited key findings of: in bed from endo, no distress, mild tachy, BP wnl, not vomiting blood, mild rt side abdo pain, abdo soft, did d/w IR to go to embolization STAT, get coags repeat for consumption risk, cbc stat, ensure blood type and held, had SVT, get lytes, support MAP, sounds like a re entry circuit, BB WD?, will initiate BB when safe, unable now, keep on tele, if drops BP persistent, get cortisol, lactic for perfusion status now, NPO, NO NGT with location, continued ppI, has 2 PIV 20, asking RN to get 18 gauge, may need cordis pending clinical status The patient is critically ill with multiple organ systems failure and requires high complexity  decision making for assessment and support, frequent evaluation and titration of therapies, application of advanced monitoring technologies and extensive interpretation of multiple databases.   Critical Care Time devoted to patient care services described in this note is 35 Minutes. This time reflects time of care of this signee: Merrie Roof, MD FACP. This critical care time does not reflect procedure time, or teaching time or supervisory time of PA/NP/Med student/Med Resident etc but could involve care discussion time. Rest per NP/medical resident whose note is outlined above and that I agree with   Lavon Paganini. Titus Mould, MD, [place] Pgr: [facility] 08/30/2015 9:03 PM

## 2015-08-30 NOTE — Significant Event (Signed)
Rapid Response Event Note  Overview:  Called to IR for patient with elevated HR Time Called: 1809 Arrival Time: 1810 Event Type: Cardiac, Hypotension  Initial Focused Assessment: Lying on IR table - alert - pale - denies CP or SOB - on NRB mask - HR elevated 189 - BP 64/43 RR 22 - O2 sats 93%.  IR team present - prepping for procedure.     Interventions:  Stat NS bolus started.  Discussed with Dr. Earleen Newport - reviewed earlier episode - patient placed on Zoll pads and monitor  - 6 mg Adenosine IV given per order Dr. Earleen Newport - patient converted to SR without problem - BP 114/77 HR 107 RR 18 O2 sats 100% on NRB mask.  Georgann Housekeeper NP with PCCM present - reviewed episode.  To continue with IR procedure per Dr. Earleen Newport.  Handoff to Amy Rn - to call as needed.   Plan of Care (if not transferred):  Will transfer back to ICU post procedure.   Event Summary: Name of Physician Notified: Dr. Damita Dunnings - present in procedure at  (present with procedure)  Name of Consulting Physician Notified: Georgann Housekeeper NP with PCCM at 1815  Outcome: Other (Comment) (continued with IR procedure per Dr. Earleen Newport)  Event End Time: [id number]  Quin Hoop

## 2015-08-30 NOTE — Care Management Important Message (Signed)
Important Message  Patient Details  Name: William Rivera MRN: [id number] Date of Birth: 05/10/1935   Medicare Important Message Given:  Yes    [name] 08/30/2015, 3:07 PM

## 2015-08-30 NOTE — Sedation Documentation (Signed)
Blood infusing- no reaction noted, rate 125cc/hr

## 2015-08-30 NOTE — Sedation Documentation (Addendum)
Patient is resting comfortably.changed pt over to 3L/[place]- sats remain 100

## 2015-08-30 NOTE — Op Note (Signed)
[facility] Patient Name: William Rivera Procedure Date : 08/30/2015 MRN: [id number] Attending MD: Gatha Mayer , MD Date of Birth: 02-20-1935 CSN: [id number] Age: 80 Admit Type: Inpatient Procedure:                Upper GI endoscopy Indications:              Melena Providers:                Gatha Mayer, MD, Sarah Monday RN, RN, Dortha Schwalbe RN, RN, William Dalton, Technician Referring MD:              Medicines:                Midazolam 2 mg IV, Fentanyl 37.5 micrograms IV Complications:            Ulcer started bleeding pssibly from scope                            manipulation. Major bleeding - more than minimal                            blood loss. Estimated Blood Loss:     Moderate Procedure:                Pre-Anesthesia Assessment:                           - Prior to the procedure, a History and Physical                            was performed, and patient medications and                            allergies were reviewed. The patient's tolerance of                            previous anesthesia was also reviewed. The risks                            and benefits of the procedure and the sedation                            options and risks were discussed with the patient.                            All questions were answered, and informed consent                            was obtained. Prior Anticoagulants: The patient has                            taken no previous anticoagulant or antiplatelet  agents. ASA Grade Assessment: IV - A patient with                            severe systemic disease that is a constant threat                            to life. After reviewing the risks and benefits,                            the patient was deemed in satisfactory condition to                            undergo the procedure.                           After obtaining informed consent, the endoscope was                             passed under direct vision. Throughout the                            procedure, the patient's blood pressure, pulse, and                            oxygen saturations were monitored continuously. The                            was introduced through the mouth, and advanced to                            the duodenal bulb. The upper GI endoscopy was                            accomplished without difficulty. The patient                            tolerated the procedure well. Scope In: Scope Out: Findings:      One partially obstructing spurting cratered duodenal ulcer with a       visible vessel was found in the duodenal bulb. The lesion was 20 mm in       largest dimension.      The examined esophagus was normal.      The entire examined stomach was normal.      The cardia and gastric fundus were normal on retroflexion. Impression:               - One partially obstructing spurting duodenal ulcer                            with a visible vessel.                           - Normal esophagus.                           -  Normal stomach.                           - No specimens collected. Moderate Sedation:      Moderate (conscious) sedation was administered by the endoscopy nurse       and supervised by the endoscopist. The following parameters were       monitored: oxygen saturation, heart rate, blood pressure, and response       to care. Total physician intraservice time was 8 minutes. Recommendation:           - Transfer patient to ICU.                           - NPO.                           - IR consult to see if ulcer can be embolized -                            suspect gastroduodenal artery hemorrhage (again).                           - Transfuse 2 U RBC                           - Continue present medications. Procedure Code(s):        --- Professional ---                           [phone], Esophagogastroduodenoscopy, flexible,                             transoral; diagnostic, including collection of                            specimen(s) by brushing or washing, when performed                            (separate procedure) Diagnosis Code(s):        --- Professional ---                           K26.4, Chronic or unspecified duodenal ulcer with                            hemorrhage                           K92.1, Melena (includes Hematochezia) CPT copyright 2016 American Medical Association. All rights reserved. The codes documented in this report are preliminary and upon coder review may  be revised to meet current compliance requirements. Gatha Mayer, MD 08/30/2015 5:04:56 PM This report has been signed electronically. Number of Addenda: 0

## 2015-08-30 NOTE — Progress Notes (Signed)
Pt to unit to for scheduled EGD.  Placed on monitor; HR 188; BP 56/41.  Dr Carlean Purl at bedside.  Rapid Response RN called to bedside.  6mg  Adenosine given IV push.  HR to 109; BP to 99/53.  Patient condition monitored closely.

## 2015-08-30 NOTE — Progress Notes (Signed)
Assumed care of pt. Resting quietly. In no distress. Answers simple questions. Procedure in process.

## 2015-08-30 NOTE — Progress Notes (Signed)
eLink Physician-Brief Progress Note Patient Name: William Rivera DOB: Sep 18, 1935 MRN: [id number]   Date of Service  08/30/2015  HPI/Events of Note  Hgb = 8.5 post 1 unit PRBC.  eICU Interventions  Hold second unit PRBC ordered for present.      Intervention Category Major Interventions: Hemorrhage - evaluation and management  Sommer,Steven Eugene 08/30/2015, 10:56 PM

## 2015-08-30 NOTE — Progress Notes (Signed)
Patient was on services with [facility], wife requested for patient to be discharged from hospice services at this time. Fatima Blank RN from [facility] came and had Mr. Hoar to sign paper work for discharge from hospice service. Patient was informed when he is discharged from hospital to call and they would be glad to readmit if patient wants to continue with hospice services.   Danyael Alipio, Tivis Ringer, RN

## 2015-08-30 NOTE — Progress Notes (Signed)
Initial Nutrition Assessment  DOCUMENTATION CODES:   Severe malnutrition in context of chronic illness  INTERVENTION:    Diet advancement as able per MD.  When diet is advanced, add PO supplements as desired by patient.  NUTRITION DIAGNOSIS:   Malnutrition related to chronic illness as evidenced by severe depletion of muscle mass, severe depletion of body fat, percent weight loss.  GOAL:   Patient will meet greater than or equal to 90% of their needs  MONITOR:   Diet advancement, PO intake, I & O's, Weight trends, Labs  REASON FOR ASSESSMENT:   Malnutrition Screening Tool    ASSESSMENT:   80 y.o. male with hx HTN, GERD, stage 2A pancreatic adenocarcinoma (initially diagnosed in 08/2014 after presenting with pancreatitis). No resection due to lung mets. ERCP and covered metal stent in 02/2015 at [facility].  Did not tolerate chemo in 02/2015 or via intra peritonieal catheter in 06/2015 and declines further treatment. Hospice now involved but has not yet made a home visit. Admitted on 8/8 with bloody stools x 1 day related to GIB.  Labs reviewed: sodium low. Medications reviewed.  Patient reports poor intake and 60 lb weight loss since he was diagnosed with pancreatic cancer last year. He has had 17% weight loss over the past 6 months, which occured mostly during chemo treatment. Nutrition-Focused physical exam completed. Findings are severe fat depletion, severe muscle depletion, and no edema.  He drinks Ensure/Boost supplements at home. Currently NPO awaiting potential procedure.   Diet Order:  Diet NPO time specified  Skin:  Reviewed, no issues  Last BM:  8/8  Height:   Ht Readings from Last 1 Encounters:  08/29/15 5\' 9"  (1.753 m)    Weight:   Wt Readings from Last 1 Encounters:  08/30/15 157 lb 3.2 oz (71.3 kg)    Ideal Body Weight:  72.7 kg  BMI:  Body mass index is 23.21 kg/m.  Estimated Nutritional Needs:   Kcal:  2000-2200  Protein:  100-120  gm  Fluid:  2-2.2 L  EDUCATION NEEDS:   No education needs identified at this time  Molli Barrows, [place], [place], [place] Pager [phone] After Hours Pager [phone]

## 2015-08-30 NOTE — Progress Notes (Signed)
First unit blood infused, procedure still in progress

## 2015-08-30 NOTE — Progress Notes (Signed)
Egd with duodenal ulcer hemorhaging. Dr. Carlean Purl speaking with IR for possible intervention. Rapid response notified and ICU bed being ordered. Patient alert, talking. Bp 95/48, HR 104, O2 sat 100 per on 3LNC.

## 2015-08-30 NOTE — Significant Event (Signed)
Rapid Response Event Note  Overview:  Called to see patient with rapid heart rate Time Called: 1530 Arrival Time: 1535 Event Type: Cardiac  Initial Focused Assessment:  Patient alert - supine in bed - pale oriented - denies CP or SOB - felt a little dizzy - NS infusing - BP 56/41 HR 188 SVT RR 20 - 3 liter nasal cannula - endo tx not done yet - Dr. Carlean Purl at bedside.     Interventions:  Placed on Zoll pads - on oxygen - Adenosine 6 mg IV given by endo RN per order Dr. Carlean Purl.  Patient converted to SR - BP 105/61 HR 109 - patient without CP or SOB - states he feels fine now.  Dr. Carlean Purl present - to continue with procedure.  Plan of Care (if not transferred):  Close monitor by Endo team during procedure.  To call as needed.    Event Summary:   at      at          [place], Nena Jordan

## 2015-08-30 NOTE — Progress Notes (Signed)
Had HR 189 in admitting Found hx svt bp 58 systolic [place] and IV w/o No cp, sob, mildly pale 6 mg adenosine converted him to NSR/ST (mild) EKG w/o ischemic changes

## 2015-08-30 NOTE — Progress Notes (Signed)
Pt arrived in Radiology IR 1- tolerating blood product well. VS charted, rate increased to 125cc/hr

## 2015-08-31 ENCOUNTER — Encounter (HOSPITAL_COMMUNITY): Payer: Self-pay | Admitting: Internal Medicine

## 2015-08-31 DIAGNOSIS — N179 Acute kidney failure, unspecified: Secondary | ICD-10-CM

## 2015-08-31 LAB — BASIC METABOLIC PANEL
ANION GAP: 7 (ref 5–15)
BUN: 16 mg/dL (ref 6–20)
CO2: 20 mmol/L — ABNORMAL LOW (ref 22–32)
Calcium: 8 mg/dL — ABNORMAL LOW (ref 8.9–10.3)
Chloride: 118 mmol/L — ABNORMAL HIGH (ref 101–111)
Creatinine, Ser: 1.55 mg/dL — ABNORMAL HIGH (ref 0.61–1.24)
GFR, EST AFRICAN AMERICAN: 48 mL/min — AB (ref >60)
GFR, EST NON AFRICAN AMERICAN: 41 mL/min — AB (ref >60)
GLUCOSE: 102 mg/dL — AB (ref 65–99)
POTASSIUM: 4.4 mmol/L (ref 3.5–5.1)
Sodium: 145 mmol/L (ref 135–145)

## 2015-08-31 LAB — CBC
HCT: 26.2 % — ABNORMAL LOW (ref 39.0–52.0)
HEMATOCRIT: 24.6 % — AB (ref 39.0–52.0)
HEMOGLOBIN: 8 g/dL — AB (ref 13.0–17.0)
HEMOGLOBIN: 8.6 g/dL — AB (ref 13.0–17.0)
MCH: 29.4 pg (ref 26.0–34.0)
MCH: 29.9 pg (ref 26.0–34.0)
MCHC: 32.5 g/dL (ref 30.0–36.0)
MCHC: 32.8 g/dL (ref 30.0–36.0)
MCV: 90.4 fL (ref 78.0–100.0)
MCV: 91 fL (ref 78.0–100.0)
PLATELETS: 332 10*3/uL (ref 150–400)
PLATELETS: 347 10*3/uL (ref 150–400)
RBC: 2.72 MIL/uL — ABNORMAL LOW (ref 4.22–5.81)
RBC: 2.88 MIL/uL — AB (ref 4.22–5.81)
RDW: 16.4 % — ABNORMAL HIGH (ref 11.5–15.5)
RDW: 16.3 % — ABNORMAL HIGH (ref 11.5–15.5)
WBC: 8.5 10*3/uL (ref 4.0–10.5)
WBC: 9.9 10*3/uL (ref 4.0–10.5)

## 2015-08-31 MED ORDER — METOPROLOL TARTRATE 25 MG PO TABS
25.0000 mg | ORAL_TABLET | Freq: Two times a day (BID) | ORAL | Status: DC
  Administered 2015-08-31 – 2015-09-02 (×5): 25 mg via ORAL
  Filled 2015-08-31 (×5): qty 1

## 2015-08-31 MED ORDER — OXYCODONE HCL 5 MG PO TABS
10.0000 mg | ORAL_TABLET | Freq: Two times a day (BID) | ORAL | Status: DC | PRN
  Administered 2015-08-31 – 2015-09-02 (×3): 10 mg via ORAL
  Filled 2015-08-31 (×3): qty 2

## 2015-08-31 MED ORDER — METOPROLOL TARTRATE 5 MG/5ML IV SOLN: 2.5000 mg | INTRAVENOUS | Status: DC | PRN

## 2015-08-31 MED ORDER — SUCRALFATE 1 G PO TABS
1.0000 g | ORAL_TABLET | Freq: Three times a day (TID) | ORAL | Status: DC
  Administered 2015-08-31 – 2015-09-02 (×7): 1 g via ORAL
  Filled 2015-08-31 (×8): qty 1

## 2015-08-31 MED ORDER — OXYCODONE HCL 10 MG PO TABS: 10.0000 mg | ORAL_TABLET | Freq: Two times a day (BID) | ORAL | Status: DC | PRN

## 2015-08-31 MED ORDER — METOPROLOL TARTRATE 5 MG/5ML IV SOLN
INTRAVENOUS | Status: AC
  Filled 2015-08-31: qty 5

## 2015-08-31 MED ORDER — METOPROLOL TARTRATE 5 MG/5ML IV SOLN
5.0000 mg | Freq: Once | INTRAVENOUS | Status: AC
  Administered 2015-08-31: 5 mg via INTRAVENOUS

## 2015-08-31 MED FILL — Medication: Qty: 1 | Status: AC

## 2015-08-31 NOTE — Progress Notes (Signed)
Report called to 5W RN  Lonn Georgia, RN

## 2015-08-31 NOTE — Progress Notes (Addendum)
Pt received as transfer to 5W-37 in stable condition. VSS. Pt denies pain at this time. Telemetry placed and verified. Wife is at bedside. Will continue to monitor pt. Bed placed in lowest position and call bell is within reach.

## 2015-08-31 NOTE — Consult Note (Addendum)
PULMONARY / CRITICAL CARE MEDICINE   Name: William Rivera MRN: [id number] DOB: Dec 08, 1935    ADMISSION DATE:  08/29/2015 CONSULTATION DATE:  8/9  REFERRING MD:  Dr. Erlinda Hong  CHIEF COMPLAINT:  GI bleed  HISTORY OF PRESENT ILLNESS: 80 year old male with past medical history as below, which is significant for pancreatic cancer, Barrett's esophagus, GERD, and GI bleed requiring embolization. Pancreatic cancer is adenocarcinoma which was initially diagnosed in August 2016. He did not have resection in the setting of metastasis to the lungs. He was unable to tolerate chemotherapy and intraperitoneal catheter so further treatment was aborted. Hospice has been becoming involved. He presented to [facility] emergency department with complaints of hematochezia 1 day. Initially started as maroon colored stools but has progressed and melena. Upon presentation to the emergency department his vital signs are stable, however, there had been a notable drop in his hemoglobin over the past couple of months from 12-7.7. He was admitted by the hospitalist team and was treated in the usual fashion with Protonix, transfusion of 2 units packed red blood cells, and gastroenterology consultation. 8/9 he was taken for upper endoscopy, however, prior to procedure he was noted to be profound tachycardic and hypotensive and what appeared to be SVT which was converted to sinus rhythm with 6 adenosine. Upper endoscopy demonstrated hemorrhaging duodenal ulcer which was unable to be abated endoscopically. He was emergently transferred to ICU with emergent interventional radiology consultation. He was taken to IR and underwent embolization, he is returned to ICU for recovery.  REVIEW OF SYSTEMS:   ROS   SUBJECTIVE: Denies abdo pain No malena or vmoiting afebrile   VITAL SIGNS: BP 121/60   Pulse 84   Temp 97.8 F (36.6 C) (Oral)   Resp 14   Ht 5\' 9"  (1.753 m)   Wt 159 lb 6.3 oz (72.3 kg)   SpO2 100%   BMI 23.54 kg/m    HEMODYNAMICS:    VENTILATOR SETTINGS:    INTAKE / OUTPUT: I/O last 3 completed shifts: In: 3557.6 [I.V.:2422.6; Blood:1035; IV Piggyback:100] Out: [id number] [Urine:3850]  PHYSICAL EXAMINATION: General:  Awake, alert Neuro:  Nonfocal, perrl HEENT:  jvd down, no stridor Cardiovascular:  s1 s2 RR mild tachy, not in svt currently Lungs:  CTA to bases Abdomen:  Sof, BS low, no r/g, no tenderness to palpation Musculoskeletal:  No edema Skin:  No rash  LABS:  BMET  Recent Labs Lab 08/30/15 0137 08/30/15 2200 08/31/15 0400  NA 140 140 145  K 4.3 4.2 4.4  CL 114* 117* 118*  CO2 21* 19* 20*  BUN 18 17 16   CREATININE 1.98* 1.93* 1.55*  GLUCOSE 102* 111* 102*    Electrolytes  Recent Labs Lab 08/30/15 0137 08/30/15 2200 08/31/15 0400  CALCIUM 8.2* 7.7* 8.0*    CBC  Recent Labs Lab 08/30/15 0137 08/30/15 2200 08/31/15 0400  WBC 10.7* 9.4 8.5  HGB 8.2* 8.5* 8.0*  HCT 24.6* 26.2* 24.6*  PLT 331 340 332    Coag's  Recent Labs Lab 08/29/15 1650 08/30/15 0137  INR 1.31 1.40    Sepsis Markers  Recent Labs Lab 08/29/15 1650 08/30/15 2200  LATICACIDVEN 2.2* 0.8    ABG No results for input(s): PHART, PCO2ART, PO2ART in the last 168 hours.  Liver Enzymes  Recent Labs Lab 08/29/15 1650  AST 51*  ALT 32  ALKPHOS 103  BILITOT 0.4  ALBUMIN 2.4*    Cardiac Enzymes  Recent Labs Lab 08/30/15 2200  TROPONINI 0.04*  Glucose No results for input(s): GLUCAP in the last 168 hours.  Imaging No results found.   STUDIES:  06/02/2015 EGD for melena, hematochezia.  Mild gastritis, large DU with VV but not bleeding: no hemostatic therapy.  Unable to pass scope beyond bulb due to presence of stent.   06/03/2015 IR embolization of gastroduodenal artery 08/30/2015  EGD, duodenal ulcer hemorrhage  CULTURES:   ANTIBIOTICS:   SIGNIFICANT EVENTS: 8/8 admit for GIB 8/9 EGD with hemorrhaging duodenal ulcer, taken to OR for  embolization  LINES/TUBES: R femoral CVL 8/9 >   DISCUSSION: 80 year old male with PMH as below, which includes pancreatic adeno and GIB. He has been transitioning to hospice care for Ca. Developed GI bleeding 8/8 with progressed 8/9. EGD 8/9 with duodenal hemorrhage. To IR.   ASSESSMENT / PLAN:   CARDIOVASCULAR A:  Hemorrhagic shock SVT > converted with 6mg  adenosine, likely in setting of above H/o HTN  P: Telemetry Resume  metoprolol  RENAL A:   AKI  Chronic kidney disease  P:   Repeat BMP  GASTROINTESTINAL A:   Duodenal hemorrhage Pancreatic Cancer  > Failed/did not tolerate tx, transitioning to hospice.   P:   S/p  Embolization of pseudoaneurysm D2 NPO Protonix infusion, 40 q 12 in 72h GI following  HEMATOLOGIC A:   ABLA secondary to GI hemorrhage  P:  Serial CBC Transfuse PRBC for HGB < 7  SCDs    FAMILY  - Updates: patient / wife 74/10  - Inter-disciplinary family meet or Palliative Care meeting due by: 8/15  Transfer to tele & back to triad 8/11 Please remove femoral CVL in 24h  Kara Mead MD. FCCP. [facility] Pager [phone] If no response call [phone]    08/31/2015 9:12 AM

## 2015-08-31 NOTE — Progress Notes (Signed)
Transferred pt to [phone] with a wheelchair, pt placed in bed, SCDs placed on, telemetry leads placed by NT, NT to receive   Lonn Georgia, RN

## 2015-08-31 NOTE — Progress Notes (Signed)
Referring Physician(s): Dr Titus Mould  Supervising Physician: Markus Daft  Patient Status:  Inpatient  Chief Complaint:  Procedure:  Mesenteric angiogram via R CFA access, for acute upper GI bleeding.    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    2 x 39mm coil to embolize vessel, with no flow to the territory at the conclusion.   US guided R CFV triple lumen for large-bore access/fluids  Subjective:  No further complaints No bleeding Feels better today  Allergies: Doxycycline; Colestipol; and Doxycycline  Medications: Prior to Admission medications   Medication Sig Start Date End Date Taking? Authorizing Provider  feeding supplement (BOOST / RESOURCE BREEZE) LIQD Take 1 Container by mouth 3 (three) times daily with meals. 06/05/15  Yes Reyne Dumas, MD  GAS RELIEF 125 MG CAPS TAKE ONE TO TWO SOFTGELS BY MOUTH DAILY AS NEEDED FOR FLATULENCE 06/02/15  Yes Historical Provider, MD  hydrocortisone (ANUSOL-HC) 2.5 % rectal cream Place 1 application rectally 2 (two) times daily. 08/28/15  Yes Orvil Feil, NP  metoprolol succinate (TOPROL-XL) 100 MG 24 hr tablet Take 100 mg by mouth daily.  08/22/15  Yes Historical Provider, MD  Oxycodone HCl 10 MG TABS Take 1 tablet (10 mg total) by mouth every 4 (four) hours as needed. Patient taking differently: Take 10 mg by mouth 2 (two) times daily as needed (FOR PAIN).  08/24/15  Yes Patrici Ranks, MD  pantoprazole (PROTONIX) 40 MG tablet Take 1 tablet (40 mg total) by mouth 2 (two) times daily. Patient taking differently: Take 40 mg by mouth daily.  06/05/15  Yes Reyne Dumas, MD  sucralfate (CARAFATE) 1 g tablet Take 1 g by mouth 3 (three) times daily.   Yes Historical Provider, MD  ciprofloxacin (CIPRO) 500 MG tablet Take 500 mg by mouth 2 (two) times daily. [place] ON 08/22/2015-COMPLETED COURSE 08/22/15   Historical Provider, MD    metroNIDAZOLE (FLAGYL) 500 MG tablet Take 1,000 mg by mouth 2 (two) times daily. [place] 08/22/2015-COMPLETED 08/22/15   Historical Provider, MD     Vital Signs: BP 118/68   Pulse 68   Temp 97.9 F (36.6 C) (Oral)   Resp 20   Ht 5\' 9"  (1.753 m)   Wt 159 lb 6.3 oz (72.3 kg)   SpO2 100%   BMI 23.54 kg/m   Physical Exam  Constitutional: He is oriented to person, place, and time.  Musculoskeletal: Normal range of motion.  Rt groin with Triple Lumen catheter intact No bleeding No hematoma  Neurological: He is alert and oriented to person, place, and time.    Imaging: Ir Angiogram Visceral Selective  Result Date: 08/31/2015 INDICATION: 80 year old male with known pancreatic carcinoma. 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 This was coil embolized May of 2017. He presents again with melena and upper GI bleeding. Endoscopies performed on the day of the procedure demonstrates active hemorrhage in the duodenum. Given his hemodynamic instability and the endoscopy findings, angiogram and potential embolization indicated. EXAM: SELECTIVE VISCERAL ARTERIOGRAPHY; IR ULTRASOUND GUIDANCE VASC ACCESS RIGHT; ADDITIONAL ARTERIOGRAPHY; IR RIGHT FLOURO GUIDE CV LINE; IR EMBO ART VEN HEMORR LYMPH EXTRAV INC GUIDE ROADMAPPING; ARTERIOGRAPHY Ultrasound-guided right common femoral central venous access. MEDICATIONS: None ANESTHESIA/SEDATION: A total of Versed 0 mg and Fentanyl 25 mcg was administered intravenously. No conscious sedation The patient's level of consciousness and vital signs were monitored  continuously by radiology nursing throughout the procedure under my direct supervision. CONTRAST:  150 cc FLUOROSCOPY TIME:  Fluoroscopy Time: 48 minutes 0 seconds (2,190 mGy). COMPLICATIONS: None PROCEDURE: Informed consent was obtained from the patient following explanation of the procedure, risks, benefits and alternatives. The patient understands, agrees and  consents for the procedure. All questions were addressed. A time out was performed prior to the initiation of the procedure. Maximal barrier sterile technique utilized including caps, mask, sterile gowns, sterile gloves, large sterile drape, hand hygiene, and Betadine prep. Ultrasound survey of the right inguinal region was performed with images stored and sent to PACs. A micropuncture needle was used access the right common femoral artery under ultrasound. With excellent arterial blood flow returned, and an .018 micro wire was passed through the needle, observed enter the abdominal aorta under fluoroscopy. The needle was removed, and a micropuncture sheath was placed over the wire. The inner dilator and wire were removed, and an 035 Bentson wire was advanced under fluoroscopy into the abdominal aorta. The sheath was removed and a standard 5 [place] vascular sheath was placed. The dilator was removed and the sheath was flushed. Ultrasound guidance was then used to access the right common femoral vein. A single wall needle was used with the saline syringe. Once venous blood was aspirated, 035 wire was passed through the needle, observed enter the IVC under fluoroscopy. The needle was removed, dilation of the soft tissues was performed, and a triple-lumen central catheter was placed over the wire. The lumen were flushed. Cobra catheter was then advanced through the arterial sheath over the Bentson wire to the level of the superior mesenteric artery. SMA angiogram was performed. Increase magnification was used for planning angiogram and then a micro catheter system was used in an attempt to select the origin of the inferior pancreaticoduodenal arcade contributing to the abnormality in the region of the prior coil mass. The acute angle of the vessel origin proved difficult to select with the micro catheter and base catheter combination, and os the base catheter was exchanged over the 035 wire for a Mickelson catheter.  Again, difficulty achieving micro catheter placement was encountered with the Mickelson catheter as the base catheter. The Mickelson catheter was then exchanged for the Cobra catheter, which was advanced over the micro catheter system several cm into the origin of the SMA. With exaggerated curve on the micro wire, the targeted vessel was selected. The third order branch vessel again proved difficult to navigate with an acute angle to the patient's right after the initial leftward branch. Thus, with the micro wire micro catheter were advanced into a distal branch and the base catheter was advanced into the origin of the second order branch. The third order branch contributing to the pseudoaneurysm was then selected. Micro catheter was advanced as distal as possible, with coil embolization performed with 2 separate 2 mm coil. Micro catheter was withdrawn and then the Cobra catheter was used to select the celiac artery. Celiac angiogram performed. Superior mesenteric artery angiogram was then repeated, to assure no continuous or further flow to the pseudoaneurysm. Limited angiogram at the right common femoral artery access was performed. All catheters and wires were removed. Exoseal device was then deployed for hemostasis. Patient tolerated the procedure well and remained hemodynamically stable throughout. No complications were encountered and no significant blood loss encountered. FINDINGS: Superior mesenteric artery angiogram demonstrates normal filling of the arcades an the ileal colicky artery. Inferior pancreaticoduodenal branches contribute to pseudoaneurysm near the previously  placed micro coil pack of the gastric duodenal artery. There is filling of the gastroepiploic artery via these inferior branches. After coil embolization of the proximal collateral vessel there is no continuous flow to the pseudoaneurysm. Because the coil pack on today's study is proximal to the gastroepiploic branch, there is potential  for reversal of flow into the region of the pseudoaneurysm at the isolated distal GDA. Future embolization may be difficult to reach this site Angiogram of the celiac artery demonstrates no filling of the gastroduodenal artery beyond the coil pack with no collateral contribution to the distal GDA from the celiac artery. Status post embolization, final angiogram demonstrates no continuous flow to the distal GDA via the embolized pancreaticoduodenal arcade. IMPRESSION: Status post ultrasound-guided right common femoral artery access with mesenteric angiogram and coil embolization of inferior pancreaticoduodenal arcade branch contributing to pseudoaneurysm of distal GDA. Deployment of Exoseal device for hemostasis. Ultrasound-guided right common femoral vein central venous access. Signed, Dulcy Fanny. Earleen Newport, DO [facility] Radiology Electronically Signed   By: Corrie Mckusick D.O.   On: 08/31/2015 10:51   Ir Angiogram Visceral Selective  Result Date: 08/31/2015 INDICATION: 80 year old male with known pancreatic carcinoma. 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 This was coil embolized May of 2017. He presents again with melena and upper GI bleeding. Endoscopies performed on the day of the procedure demonstrates active hemorrhage in the duodenum. Given his hemodynamic instability and the endoscopy findings, angiogram and potential embolization indicated. EXAM: SELECTIVE VISCERAL ARTERIOGRAPHY; IR ULTRASOUND GUIDANCE VASC ACCESS RIGHT; ADDITIONAL ARTERIOGRAPHY; IR RIGHT FLOURO GUIDE CV LINE; IR EMBO ART VEN HEMORR LYMPH EXTRAV INC GUIDE ROADMAPPING; ARTERIOGRAPHY Ultrasound-guided right common femoral central venous access. MEDICATIONS: None ANESTHESIA/SEDATION: A total of Versed 0 mg and Fentanyl 25 mcg was administered intravenously. No conscious sedation The patient's level of consciousness and vital signs were monitored continuously by  radiology nursing throughout the procedure under my direct supervision. CONTRAST:  150 cc FLUOROSCOPY TIME:  Fluoroscopy Time: 48 minutes 0 seconds (2,190 mGy). COMPLICATIONS: None PROCEDURE: Informed consent was obtained from the patient following explanation of the procedure, risks, benefits and alternatives. The patient understands, agrees and consents for the procedure. All questions were addressed. A time out was performed prior to the initiation of the procedure. Maximal barrier sterile technique utilized including caps, mask, sterile gowns, sterile gloves, large sterile drape, hand hygiene, and Betadine prep. Ultrasound survey of the right inguinal region was performed with images stored and sent to PACs. A micropuncture needle was used access the right common femoral artery under ultrasound. With excellent arterial blood flow returned, and an .018 micro wire was passed through the needle, observed enter the abdominal aorta under fluoroscopy. The needle was removed, and a micropuncture sheath was placed over the wire. The inner dilator and wire were removed, and an 035 Bentson wire was advanced under fluoroscopy into the abdominal aorta. The sheath was removed and a standard 5 [place] vascular sheath was placed. The dilator was removed and the sheath was flushed. Ultrasound guidance was then used to access the right common femoral vein. A single wall needle was used with the saline syringe. Once venous blood was aspirated, 035 wire was passed through the needle, observed enter the IVC under fluoroscopy. The needle was removed, dilation of the soft tissues was performed, and a triple-lumen central catheter was placed over the wire. The lumen were flushed. Cobra catheter was then advanced through the arterial sheath over the [facility]  wire to the level of the superior mesenteric artery. SMA angiogram was performed. Increase magnification was used for planning angiogram and then a micro catheter system was used in  an attempt to select the origin of the inferior pancreaticoduodenal arcade contributing to the abnormality in the region of the prior coil mass. The acute angle of the vessel origin proved difficult to select with the micro catheter and base catheter combination, and os the base catheter was exchanged over the 035 wire for a Mickelson catheter. Again, difficulty achieving micro catheter placement was encountered with the Mickelson catheter as the base catheter. The Mickelson catheter was then exchanged for the Cobra catheter, which was advanced over the micro catheter system several cm into the origin of the SMA. With exaggerated curve on the micro wire, the targeted vessel was selected. The third order branch vessel again proved difficult to navigate with an acute angle to the patient's right after the initial leftward branch. Thus, with the micro wire micro catheter were advanced into a distal branch and the base catheter was advanced into the origin of the second order branch. The third order branch contributing to the pseudoaneurysm was then selected. Micro catheter was advanced as distal as possible, with coil embolization performed with 2 separate 2 mm coil. Micro catheter was withdrawn and then the Cobra catheter was used to select the celiac artery. Celiac angiogram performed. Superior mesenteric artery angiogram was then repeated, to assure no continuous or further flow to the pseudoaneurysm. Limited angiogram at the right common femoral artery access was performed. All catheters and wires were removed. Exoseal device was then deployed for hemostasis. Patient tolerated the procedure well and remained hemodynamically stable throughout. No complications were encountered and no significant blood loss encountered. FINDINGS: Superior mesenteric artery angiogram demonstrates normal filling of the arcades an the ileal colicky artery. Inferior pancreaticoduodenal branches contribute to pseudoaneurysm near the  previously placed micro coil pack of the gastric duodenal artery. There is filling of the gastroepiploic artery via these inferior branches. After coil embolization of the proximal collateral vessel there is no continuous flow to the pseudoaneurysm. Because the coil pack on today's study is proximal to the gastroepiploic branch, there is potential for reversal of flow into the region of the pseudoaneurysm at the isolated distal GDA. Future embolization may be difficult to reach this site Angiogram of the celiac artery demonstrates no filling of the gastroduodenal artery beyond the coil pack with no collateral contribution to the distal GDA from the celiac artery. Status post embolization, final angiogram demonstrates no continuous flow to the distal GDA via the embolized pancreaticoduodenal arcade. IMPRESSION: Status post ultrasound-guided right common femoral artery access with mesenteric angiogram and coil embolization of inferior pancreaticoduodenal arcade branch contributing to pseudoaneurysm of distal GDA. Deployment of Exoseal device for hemostasis. Ultrasound-guided right common femoral vein central venous access. Signed, Dulcy Fanny. Earleen Newport, DO [facility] Radiology Electronically Signed   By: Corrie Mckusick D.O.   On: 08/31/2015 10:51   Ir Angiogram Selective Each Additional Vessel  Result Date: 08/31/2015 INDICATION: 80 year old male with known pancreatic carcinoma. 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 This was coil embolized May of 2017. He presents again with melena and upper GI bleeding. Endoscopies performed on the day of the procedure demonstrates active hemorrhage in the duodenum. Given his hemodynamic instability and the endoscopy findings, angiogram and potential embolization indicated. EXAM: SELECTIVE VISCERAL ARTERIOGRAPHY; IR ULTRASOUND GUIDANCE VASC ACCESS RIGHT; ADDITIONAL ARTERIOGRAPHY; IR  RIGHT FLOURO GUIDE  CV LINE; IR EMBO ART VEN HEMORR LYMPH EXTRAV INC GUIDE ROADMAPPING; ARTERIOGRAPHY Ultrasound-guided right common femoral central venous access. MEDICATIONS: None ANESTHESIA/SEDATION: A total of Versed 0 mg and Fentanyl 25 mcg was administered intravenously. No conscious sedation The patient's level of consciousness and vital signs were monitored continuously by radiology nursing throughout the procedure under my direct supervision. CONTRAST:  150 cc FLUOROSCOPY TIME:  Fluoroscopy Time: 48 minutes 0 seconds (2,190 mGy). COMPLICATIONS: None PROCEDURE: Informed consent was obtained from the patient following explanation of the procedure, risks, benefits and alternatives. The patient understands, agrees and consents for the procedure. All questions were addressed. A time out was performed prior to the initiation of the procedure. Maximal barrier sterile technique utilized including caps, mask, sterile gowns, sterile gloves, large sterile drape, hand hygiene, and Betadine prep. Ultrasound survey of the right inguinal region was performed with images stored and sent to PACs. A micropuncture needle was used access the right common femoral artery under ultrasound. With excellent arterial blood flow returned, and an .018 micro wire was passed through the needle, observed enter the abdominal aorta under fluoroscopy. The needle was removed, and a micropuncture sheath was placed over the wire. The inner dilator and wire were removed, and an 035 Bentson wire was advanced under fluoroscopy into the abdominal aorta. The sheath was removed and a standard 5 [place] vascular sheath was placed. The dilator was removed and the sheath was flushed. Ultrasound guidance was then used to access the right common femoral vein. A single wall needle was used with the saline syringe. Once venous blood was aspirated, 035 wire was passed through the needle, observed enter the IVC under fluoroscopy. The needle was removed, dilation of the soft  tissues was performed, and a triple-lumen central catheter was placed over the wire. The lumen were flushed. Cobra catheter was then advanced through the arterial sheath over the Bentson wire to the level of the superior mesenteric artery. SMA angiogram was performed. Increase magnification was used for planning angiogram and then a micro catheter system was used in an attempt to select the origin of the inferior pancreaticoduodenal arcade contributing to the abnormality in the region of the prior coil mass. The acute angle of the vessel origin proved difficult to select with the micro catheter and base catheter combination, and os the base catheter was exchanged over the 035 wire for a Mickelson catheter. Again, difficulty achieving micro catheter placement was encountered with the Mickelson catheter as the base catheter. The Mickelson catheter was then exchanged for the Cobra catheter, which was advanced over the micro catheter system several cm into the origin of the SMA. With exaggerated curve on the micro wire, the targeted vessel was selected. The third order branch vessel again proved difficult to navigate with an acute angle to the patient's right after the initial leftward branch. Thus, with the micro wire micro catheter were advanced into a distal branch and the base catheter was advanced into the origin of the second order branch. The third order branch contributing to the pseudoaneurysm was then selected. Micro catheter was advanced as distal as possible, with coil embolization performed with 2 separate 2 mm coil. Micro catheter was withdrawn and then the Cobra catheter was used to select the celiac artery. Celiac angiogram performed. Superior mesenteric artery angiogram was then repeated, to assure no continuous or further flow to the pseudoaneurysm. Limited angiogram at the right common femoral artery access was performed. All catheters and wires were removed.  Exoseal device was then deployed for  hemostasis. Patient tolerated the procedure well and remained hemodynamically stable throughout. No complications were encountered and no significant blood loss encountered. FINDINGS: Superior mesenteric artery angiogram demonstrates normal filling of the arcades an the ileal colicky artery. Inferior pancreaticoduodenal branches contribute to pseudoaneurysm near the previously placed micro coil pack of the gastric duodenal artery. There is filling of the gastroepiploic artery via these inferior branches. After coil embolization of the proximal collateral vessel there is no continuous flow to the pseudoaneurysm. Because the coil pack on today's study is proximal to the gastroepiploic branch, there is potential for reversal of flow into the region of the pseudoaneurysm at the isolated distal GDA. Future embolization may be difficult to reach this site Angiogram of the celiac artery demonstrates no filling of the gastroduodenal artery beyond the coil pack with no collateral contribution to the distal GDA from the celiac artery. Status post embolization, final angiogram demonstrates no continuous flow to the distal GDA via the embolized pancreaticoduodenal arcade. IMPRESSION: Status post ultrasound-guided right common femoral artery access with mesenteric angiogram and coil embolization of inferior pancreaticoduodenal arcade branch contributing to pseudoaneurysm of distal GDA. Deployment of Exoseal device for hemostasis. Ultrasound-guided right common femoral vein central venous access. Signed, Dulcy Fanny. Earleen Newport, DO [facility] Radiology Electronically Signed   By: Corrie Mckusick D.O.   On: 08/31/2015 10:51   Ir Angiogram Selective Each Additional Vessel  Result Date: 08/31/2015 INDICATION: 80 year old male with known pancreatic carcinoma. 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 This was coil embolized May of 2017. He presents again with  melena and upper GI bleeding. Endoscopies performed on the day of the procedure demonstrates active hemorrhage in the duodenum. Given his hemodynamic instability and the endoscopy findings, angiogram and potential embolization indicated. EXAM: SELECTIVE VISCERAL ARTERIOGRAPHY; IR ULTRASOUND GUIDANCE VASC ACCESS RIGHT; ADDITIONAL ARTERIOGRAPHY; IR RIGHT FLOURO GUIDE CV LINE; IR EMBO ART VEN HEMORR LYMPH EXTRAV INC GUIDE ROADMAPPING; ARTERIOGRAPHY Ultrasound-guided right common femoral central venous access. MEDICATIONS: None ANESTHESIA/SEDATION: A total of Versed 0 mg and Fentanyl 25 mcg was administered intravenously. No conscious sedation The patient's level of consciousness and vital signs were monitored continuously by radiology nursing throughout the procedure under my direct supervision. CONTRAST:  150 cc FLUOROSCOPY TIME:  Fluoroscopy Time: 48 minutes 0 seconds (2,190 mGy). COMPLICATIONS: None PROCEDURE: Informed consent was obtained from the patient following explanation of the procedure, risks, benefits and alternatives. The patient understands, agrees and consents for the procedure. All questions were addressed. A time out was performed prior to the initiation of the procedure. Maximal barrier sterile technique utilized including caps, mask, sterile gowns, sterile gloves, large sterile drape, hand hygiene, and Betadine prep. Ultrasound survey of the right inguinal region was performed with images stored and sent to PACs. A micropuncture needle was used access the right common femoral artery under ultrasound. With excellent arterial blood flow returned, and an .018 micro wire was passed through the needle, observed enter the abdominal aorta under fluoroscopy. The needle was removed, and a micropuncture sheath was placed over the wire. The inner dilator and wire were removed, and an 035 Bentson wire was advanced under fluoroscopy into the abdominal aorta. The sheath was removed and a standard 5 [place]  vascular sheath was placed. The dilator was removed and the sheath was flushed. Ultrasound guidance was then used to access the right common femoral vein. A single wall needle was used with the saline syringe.  Once venous blood was aspirated, 035 wire was passed through the needle, observed enter the IVC under fluoroscopy. The needle was removed, dilation of the soft tissues was performed, and a triple-lumen central catheter was placed over the wire. The lumen were flushed. Cobra catheter was then advanced through the arterial sheath over the Bentson wire to the level of the superior mesenteric artery. SMA angiogram was performed. Increase magnification was used for planning angiogram and then a micro catheter system was used in an attempt to select the origin of the inferior pancreaticoduodenal arcade contributing to the abnormality in the region of the prior coil mass. The acute angle of the vessel origin proved difficult to select with the micro catheter and base catheter combination, and os the base catheter was exchanged over the 035 wire for a Mickelson catheter. Again, difficulty achieving micro catheter placement was encountered with the Mickelson catheter as the base catheter. The Mickelson catheter was then exchanged for the Cobra catheter, which was advanced over the micro catheter system several cm into the origin of the SMA. With exaggerated curve on the micro wire, the targeted vessel was selected. The third order branch vessel again proved difficult to navigate with an acute angle to the patient's right after the initial leftward branch. Thus, with the micro wire micro catheter were advanced into a distal branch and the base catheter was advanced into the origin of the second order branch. The third order branch contributing to the pseudoaneurysm was then selected. Micro catheter was advanced as distal as possible, with coil embolization performed with 2 separate 2 mm coil. Micro catheter was  withdrawn and then the Cobra catheter was used to select the celiac artery. Celiac angiogram performed. Superior mesenteric artery angiogram was then repeated, to assure no continuous or further flow to the pseudoaneurysm. Limited angiogram at the right common femoral artery access was performed. All catheters and wires were removed. Exoseal device was then deployed for hemostasis. Patient tolerated the procedure well and remained hemodynamically stable throughout. No complications were encountered and no significant blood loss encountered. FINDINGS: Superior mesenteric artery angiogram demonstrates normal filling of the arcades an the ileal colicky artery. Inferior pancreaticoduodenal branches contribute to pseudoaneurysm near the previously placed micro coil pack of the gastric duodenal artery. There is filling of the gastroepiploic artery via these inferior branches. After coil embolization of the proximal collateral vessel there is no continuous flow to the pseudoaneurysm. Because the coil pack on today's study is proximal to the gastroepiploic branch, there is potential for reversal of flow into the region of the pseudoaneurysm at the isolated distal GDA. Future embolization may be difficult to reach this site Angiogram of the celiac artery demonstrates no filling of the gastroduodenal artery beyond the coil pack with no collateral contribution to the distal GDA from the celiac artery. Status post embolization, final angiogram demonstrates no continuous flow to the distal GDA via the embolized pancreaticoduodenal arcade. IMPRESSION: Status post ultrasound-guided right common femoral artery access with mesenteric angiogram and coil embolization of inferior pancreaticoduodenal arcade branch contributing to pseudoaneurysm of distal GDA. Deployment of Exoseal device for hemostasis. Ultrasound-guided right common femoral vein central venous access. Signed, Dulcy Fanny. Earleen Newport, DO Vascular and Interventional Radiology  Specialists [facility] Radiology Electronically Signed   By: Corrie Mckusick D.O.   On: 08/31/2015 10:51   Ir Angiogram Follow Up Study  Result Date: 08/31/2015 INDICATION: 80 year old male with known pancreatic carcinoma. 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 This was  coil embolized May of 2017. He presents again with melena and upper GI bleeding. Endoscopies performed on the day of the procedure demonstrates active hemorrhage in the duodenum. Given his hemodynamic instability and the endoscopy findings, angiogram and potential embolization indicated. EXAM: SELECTIVE VISCERAL ARTERIOGRAPHY; IR ULTRASOUND GUIDANCE VASC ACCESS RIGHT; ADDITIONAL ARTERIOGRAPHY; IR RIGHT FLOURO GUIDE CV LINE; IR EMBO ART VEN HEMORR LYMPH EXTRAV INC GUIDE ROADMAPPING; ARTERIOGRAPHY Ultrasound-guided right common femoral central venous access. MEDICATIONS: None ANESTHESIA/SEDATION: A total of Versed 0 mg and Fentanyl 25 mcg was administered intravenously. No conscious sedation The patient's level of consciousness and vital signs were monitored continuously by radiology nursing throughout the procedure under my direct supervision. CONTRAST:  150 cc FLUOROSCOPY TIME:  Fluoroscopy Time: 48 minutes 0 seconds (2,190 mGy). COMPLICATIONS: None PROCEDURE: Informed consent was obtained from the patient following explanation of the procedure, risks, benefits and alternatives. The patient understands, agrees and consents for the procedure. All questions were addressed. A time out was performed prior to the initiation of the procedure. Maximal barrier sterile technique utilized including caps, mask, sterile gowns, sterile gloves, large sterile drape, hand hygiene, and Betadine prep. Ultrasound survey of the right inguinal region was performed with images stored and sent to PACs. A micropuncture needle was used access the right common femoral artery under ultrasound. With excellent arterial blood flow returned,  and an .018 micro wire was passed through the needle, observed enter the abdominal aorta under fluoroscopy. The needle was removed, and a micropuncture sheath was placed over the wire. The inner dilator and wire were removed, and an 035 Bentson wire was advanced under fluoroscopy into the abdominal aorta. The sheath was removed and a standard 5 [place] vascular sheath was placed. The dilator was removed and the sheath was flushed. Ultrasound guidance was then used to access the right common femoral vein. A single wall needle was used with the saline syringe. Once venous blood was aspirated, 035 wire was passed through the needle, observed enter the IVC under fluoroscopy. The needle was removed, dilation of the soft tissues was performed, and a triple-lumen central catheter was placed over the wire. The lumen were flushed. Cobra catheter was then advanced through the arterial sheath over the Bentson wire to the level of the superior mesenteric artery. SMA angiogram was performed. Increase magnification was used for planning angiogram and then a micro catheter system was used in an attempt to select the origin of the inferior pancreaticoduodenal arcade contributing to the abnormality in the region of the prior coil mass. The acute angle of the vessel origin proved difficult to select with the micro catheter and base catheter combination, and os the base catheter was exchanged over the 035 wire for a Mickelson catheter. Again, difficulty achieving micro catheter placement was encountered with the Mickelson catheter as the base catheter. The Mickelson catheter was then exchanged for the Cobra catheter, which was advanced over the micro catheter system several cm into the origin of the SMA. With exaggerated curve on the micro wire, the targeted vessel was selected. The third order branch vessel again proved difficult to navigate with an acute angle to the patient's right after the initial leftward branch. Thus, with the  micro wire micro catheter were advanced into a distal branch and the base catheter was advanced into the origin of the second order branch. The third order branch contributing to the pseudoaneurysm was then selected. Micro catheter was advanced as distal as possible, with coil embolization performed with 2 separate 2 mm  coil. Micro catheter was withdrawn and then the Cobra catheter was used to select the celiac artery. Celiac angiogram performed. Superior mesenteric artery angiogram was then repeated, to assure no continuous or further flow to the pseudoaneurysm. Limited angiogram at the right common femoral artery access was performed. All catheters and wires were removed. Exoseal device was then deployed for hemostasis. Patient tolerated the procedure well and remained hemodynamically stable throughout. No complications were encountered and no significant blood loss encountered. FINDINGS: Superior mesenteric artery angiogram demonstrates normal filling of the arcades an the ileal colicky artery. Inferior pancreaticoduodenal branches contribute to pseudoaneurysm near the previously placed micro coil pack of the gastric duodenal artery. There is filling of the gastroepiploic artery via these inferior branches. After coil embolization of the proximal collateral vessel there is no continuous flow to the pseudoaneurysm. Because the coil pack on today's study is proximal to the gastroepiploic branch, there is potential for reversal of flow into the region of the pseudoaneurysm at the isolated distal GDA. Future embolization may be difficult to reach this site Angiogram of the celiac artery demonstrates no filling of the gastroduodenal artery beyond the coil pack with no collateral contribution to the distal GDA from the celiac artery. Status post embolization, final angiogram demonstrates no continuous flow to the distal GDA via the embolized pancreaticoduodenal arcade. IMPRESSION: Status post ultrasound-guided right  common femoral artery access with mesenteric angiogram and coil embolization of inferior pancreaticoduodenal arcade branch contributing to pseudoaneurysm of distal GDA. Deployment of Exoseal device for hemostasis. Ultrasound-guided right common femoral vein central venous access. Signed, Dulcy Fanny. Earleen Newport, DO Vascular and Interventional Radiology Specialists [facility] Radiology Electronically Signed   By: Corrie Mckusick D.O.   On: 08/31/2015 10:51   Ir Fluoro Guide Cv Line Right  Result Date: 08/31/2015 INDICATION: 80 year old male with known pancreatic carcinoma. 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 This was coil embolized May of 2017. He presents again with melena and upper GI bleeding. Endoscopies performed on the day of the procedure demonstrates active hemorrhage in the duodenum. Given his hemodynamic instability and the endoscopy findings, angiogram and potential embolization indicated. EXAM: SELECTIVE VISCERAL ARTERIOGRAPHY; IR ULTRASOUND GUIDANCE VASC ACCESS RIGHT; ADDITIONAL ARTERIOGRAPHY; IR RIGHT FLOURO GUIDE CV LINE; IR EMBO ART VEN HEMORR LYMPH EXTRAV INC GUIDE ROADMAPPING; ARTERIOGRAPHY Ultrasound-guided right common femoral central venous access. MEDICATIONS: None ANESTHESIA/SEDATION: A total of Versed 0 mg and Fentanyl 25 mcg was administered intravenously. No conscious sedation The patient's level of consciousness and vital signs were monitored continuously by radiology nursing throughout the procedure under my direct supervision. CONTRAST:  150 cc FLUOROSCOPY TIME:  Fluoroscopy Time: 48 minutes 0 seconds (2,190 mGy). COMPLICATIONS: None PROCEDURE: Informed consent was obtained from the patient following explanation of the procedure, risks, benefits and alternatives. The patient understands, agrees and consents for the procedure. All questions were addressed. A time out was performed prior to the initiation of the procedure. Maximal barrier sterile technique  utilized including caps, mask, sterile gowns, sterile gloves, large sterile drape, hand hygiene, and Betadine prep. Ultrasound survey of the right inguinal region was performed with images stored and sent to PACs. A micropuncture needle was used access the right common femoral artery under ultrasound. With excellent arterial blood flow returned, and an .018 micro wire was passed through the needle, observed enter the abdominal aorta under fluoroscopy. The needle was removed, and a micropuncture sheath was placed over the wire. The inner dilator and wire were removed, and an 035 Bentson  wire was advanced under fluoroscopy into the abdominal aorta. The sheath was removed and a standard 5 [place] vascular sheath was placed. The dilator was removed and the sheath was flushed. Ultrasound guidance was then used to access the right common femoral vein. A single wall needle was used with the saline syringe. Once venous blood was aspirated, 035 wire was passed through the needle, observed enter the IVC under fluoroscopy. The needle was removed, dilation of the soft tissues was performed, and a triple-lumen central catheter was placed over the wire. The lumen were flushed. Cobra catheter was then advanced through the arterial sheath over the Bentson wire to the level of the superior mesenteric artery. SMA angiogram was performed. Increase magnification was used for planning angiogram and then a micro catheter system was used in an attempt to select the origin of the inferior pancreaticoduodenal arcade contributing to the abnormality in the region of the prior coil mass. The acute angle of the vessel origin proved difficult to select with the micro catheter and base catheter combination, and os the base catheter was exchanged over the 035 wire for a Mickelson catheter. Again, difficulty achieving micro catheter placement was encountered with the Mickelson catheter as the base catheter. The Mickelson catheter was then exchanged  for the Cobra catheter, which was advanced over the micro catheter system several cm into the origin of the SMA. With exaggerated curve on the micro wire, the targeted vessel was selected. The third order branch vessel again proved difficult to navigate with an acute angle to the patient's right after the initial leftward branch. Thus, with the micro wire micro catheter were advanced into a distal branch and the base catheter was advanced into the origin of the second order branch. The third order branch contributing to the pseudoaneurysm was then selected. Micro catheter was advanced as distal as possible, with coil embolization performed with 2 separate 2 mm coil. Micro catheter was withdrawn and then the Cobra catheter was used to select the celiac artery. Celiac angiogram performed. Superior mesenteric artery angiogram was then repeated, to assure no continuous or further flow to the pseudoaneurysm. Limited angiogram at the right common femoral artery access was performed. All catheters and wires were removed. Exoseal device was then deployed for hemostasis. Patient tolerated the procedure well and remained hemodynamically stable throughout. No complications were encountered and no significant blood loss encountered. FINDINGS: Superior mesenteric artery angiogram demonstrates normal filling of the arcades an the ileal colicky artery. Inferior pancreaticoduodenal branches contribute to pseudoaneurysm near the previously placed micro coil pack of the gastric duodenal artery. There is filling of the gastroepiploic artery via these inferior branches. After coil embolization of the proximal collateral vessel there is no continuous flow to the pseudoaneurysm. Because the coil pack on today's study is proximal to the gastroepiploic branch, there is potential for reversal of flow into the region of the pseudoaneurysm at the isolated distal GDA. Future embolization may be difficult to reach this site Angiogram of the  celiac artery demonstrates no filling of the gastroduodenal artery beyond the coil pack with no collateral contribution to the distal GDA from the celiac artery. Status post embolization, final angiogram demonstrates no continuous flow to the distal GDA via the embolized pancreaticoduodenal arcade. IMPRESSION: Status post ultrasound-guided right common femoral artery access with mesenteric angiogram and coil embolization of inferior pancreaticoduodenal arcade branch contributing to pseudoaneurysm of distal GDA. Deployment of Exoseal device for hemostasis. Ultrasound-guided right common femoral vein central venous access. Signed, Dulcy Fanny. Earleen Newport,  DO Vascular and Interventional Radiology Specialists [facility] Radiology Electronically Signed   By: Corrie Mckusick D.O.   On: 08/31/2015 10:51   Ir US Guide Vasc Access Right  Result Date: 08/31/2015 INDICATION: 80 year old male with known pancreatic carcinoma. 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 This was coil embolized May of 2017. He presents again with melena and upper GI bleeding. Endoscopies performed on the day of the procedure demonstrates active hemorrhage in the duodenum. Given his hemodynamic instability and the endoscopy findings, angiogram and potential embolization indicated. EXAM: SELECTIVE VISCERAL ARTERIOGRAPHY; IR ULTRASOUND GUIDANCE VASC ACCESS RIGHT; ADDITIONAL ARTERIOGRAPHY; IR RIGHT FLOURO GUIDE CV LINE; IR EMBO ART VEN HEMORR LYMPH EXTRAV INC GUIDE ROADMAPPING; ARTERIOGRAPHY Ultrasound-guided right common femoral central venous access. MEDICATIONS: None ANESTHESIA/SEDATION: A total of Versed 0 mg and Fentanyl 25 mcg was administered intravenously. No conscious sedation The patient's level of consciousness and vital signs were monitored continuously by radiology nursing throughout the procedure under my direct supervision. CONTRAST:  150 cc FLUOROSCOPY TIME:  Fluoroscopy Time: 48 minutes 0 seconds (2,190 mGy).  COMPLICATIONS: None PROCEDURE: Informed consent was obtained from the patient following explanation of the procedure, risks, benefits and alternatives. The patient understands, agrees and consents for the procedure. All questions were addressed. A time out was performed prior to the initiation of the procedure. Maximal barrier sterile technique utilized including caps, mask, sterile gowns, sterile gloves, large sterile drape, hand hygiene, and Betadine prep. Ultrasound survey of the right inguinal region was performed with images stored and sent to PACs. A micropuncture needle was used access the right common femoral artery under ultrasound. With excellent arterial blood flow returned, and an .018 micro wire was passed through the needle, observed enter the abdominal aorta under fluoroscopy. The needle was removed, and a micropuncture sheath was placed over the wire. The inner dilator and wire were removed, and an 035 Bentson wire was advanced under fluoroscopy into the abdominal aorta. The sheath was removed and a standard 5 [place] vascular sheath was placed. The dilator was removed and the sheath was flushed. Ultrasound guidance was then used to access the right common femoral vein. A single wall needle was used with the saline syringe. Once venous blood was aspirated, 035 wire was passed through the needle, observed enter the IVC under fluoroscopy. The needle was removed, dilation of the soft tissues was performed, and a triple-lumen central catheter was placed over the wire. The lumen were flushed. Cobra catheter was then advanced through the arterial sheath over the Bentson wire to the level of the superior mesenteric artery. SMA angiogram was performed. Increase magnification was used for planning angiogram and then a micro catheter system was used in an attempt to select the origin of the inferior pancreaticoduodenal arcade contributing to the abnormality in the region of the prior coil mass. The acute angle  of the vessel origin proved difficult to select with the micro catheter and base catheter combination, and os the base catheter was exchanged over the 035 wire for a Mickelson catheter. Again, difficulty achieving micro catheter placement was encountered with the Mickelson catheter as the base catheter. The Mickelson catheter was then exchanged for the Cobra catheter, which was advanced over the micro catheter system several cm into the origin of the SMA. With exaggerated curve on the micro wire, the targeted vessel was selected. The third order branch vessel again proved difficult to navigate with an acute angle to the patient's right after the initial leftward branch. Thus,  with the micro wire micro catheter were advanced into a distal branch and the base catheter was advanced into the origin of the second order branch. The third order branch contributing to the pseudoaneurysm was then selected. Micro catheter was advanced as distal as possible, with coil embolization performed with 2 separate 2 mm coil. Micro catheter was withdrawn and then the Cobra catheter was used to select the celiac artery. Celiac angiogram performed. Superior mesenteric artery angiogram was then repeated, to assure no continuous or further flow to the pseudoaneurysm. Limited angiogram at the right common femoral artery access was performed. All catheters and wires were removed. Exoseal device was then deployed for hemostasis. Patient tolerated the procedure well and remained hemodynamically stable throughout. No complications were encountered and no significant blood loss encountered. FINDINGS: Superior mesenteric artery angiogram demonstrates normal filling of the arcades an the ileal colicky artery. Inferior pancreaticoduodenal branches contribute to pseudoaneurysm near the previously placed micro coil pack of the gastric duodenal artery. There is filling of the gastroepiploic artery via these inferior branches. After coil embolization  of the proximal collateral vessel there is no continuous flow to the pseudoaneurysm. Because the coil pack on today's study is proximal to the gastroepiploic branch, there is potential for reversal of flow into the region of the pseudoaneurysm at the isolated distal GDA. Future embolization may be difficult to reach this site Angiogram of the celiac artery demonstrates no filling of the gastroduodenal artery beyond the coil pack with no collateral contribution to the distal GDA from the celiac artery. Status post embolization, final angiogram demonstrates no continuous flow to the distal GDA via the embolized pancreaticoduodenal arcade. IMPRESSION: Status post ultrasound-guided right common femoral artery access with mesenteric angiogram and coil embolization of inferior pancreaticoduodenal arcade branch contributing to pseudoaneurysm of distal GDA. Deployment of Exoseal device for hemostasis. Ultrasound-guided right common femoral vein central venous access. Signed, Dulcy Fanny. Earleen Newport, DO Vascular and Interventional Radiology Specialists [facility] Radiology Electronically Signed   By: Corrie Mckusick D.O.   On: 08/31/2015 10:51   Ir US Guide Vasc Access Right  Result Date: 08/31/2015 INDICATION: 80 year old male with known pancreatic carcinoma. 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 This was coil embolized May of 2017. He presents again with melena and upper GI bleeding. Endoscopies performed on the day of the procedure demonstrates active hemorrhage in the duodenum. Given his hemodynamic instability and the endoscopy findings, angiogram and potential embolization indicated. EXAM: SELECTIVE VISCERAL ARTERIOGRAPHY; IR ULTRASOUND GUIDANCE VASC ACCESS RIGHT; ADDITIONAL ARTERIOGRAPHY; IR RIGHT FLOURO GUIDE CV LINE; IR EMBO ART VEN HEMORR LYMPH EXTRAV INC GUIDE ROADMAPPING; ARTERIOGRAPHY Ultrasound-guided right common femoral central venous access. MEDICATIONS: None  ANESTHESIA/SEDATION: A total of Versed 0 mg and Fentanyl 25 mcg was administered intravenously. No conscious sedation The patient's level of consciousness and vital signs were monitored continuously by radiology nursing throughout the procedure under my direct supervision. CONTRAST:  150 cc FLUOROSCOPY TIME:  Fluoroscopy Time: 48 minutes 0 seconds (2,190 mGy). COMPLICATIONS: None PROCEDURE: Informed consent was obtained from the patient following explanation of the procedure, risks, benefits and alternatives. The patient understands, agrees and consents for the procedure. All questions were addressed. A time out was performed prior to the initiation of the procedure. Maximal barrier sterile technique utilized including caps, mask, sterile gowns, sterile gloves, large sterile drape, hand hygiene, and Betadine prep. Ultrasound survey of the right inguinal region was performed with images stored and sent to PACs. A micropuncture needle was used access  the right common femoral artery under ultrasound. With excellent arterial blood flow returned, and an .018 micro wire was passed through the needle, observed enter the abdominal aorta under fluoroscopy. The needle was removed, and a micropuncture sheath was placed over the wire. The inner dilator and wire were removed, and an 035 Bentson wire was advanced under fluoroscopy into the abdominal aorta. The sheath was removed and a standard 5 [place] vascular sheath was placed. The dilator was removed and the sheath was flushed. Ultrasound guidance was then used to access the right common femoral vein. A single wall needle was used with the saline syringe. Once venous blood was aspirated, 035 wire was passed through the needle, observed enter the IVC under fluoroscopy. The needle was removed, dilation of the soft tissues was performed, and a triple-lumen central catheter was placed over the wire. The lumen were flushed. Cobra catheter was then advanced through the arterial  sheath over the Bentson wire to the level of the superior mesenteric artery. SMA angiogram was performed. Increase magnification was used for planning angiogram and then a micro catheter system was used in an attempt to select the origin of the inferior pancreaticoduodenal arcade contributing to the abnormality in the region of the prior coil mass. The acute angle of the vessel origin proved difficult to select with the micro catheter and base catheter combination, and os the base catheter was exchanged over the 035 wire for a Mickelson catheter. Again, difficulty achieving micro catheter placement was encountered with the Mickelson catheter as the base catheter. The Mickelson catheter was then exchanged for the Cobra catheter, which was advanced over the micro catheter system several cm into the origin of the SMA. With exaggerated curve on the micro wire, the targeted vessel was selected. The third order branch vessel again proved difficult to navigate with an acute angle to the patient's right after the initial leftward branch. Thus, with the micro wire micro catheter were advanced into a distal branch and the base catheter was advanced into the origin of the second order branch. The third order branch contributing to the pseudoaneurysm was then selected. Micro catheter was advanced as distal as possible, with coil embolization performed with 2 separate 2 mm coil. Micro catheter was withdrawn and then the Cobra catheter was used to select the celiac artery. Celiac angiogram performed. Superior mesenteric artery angiogram was then repeated, to assure no continuous or further flow to the pseudoaneurysm. Limited angiogram at the right common femoral artery access was performed. All catheters and wires were removed. Exoseal device was then deployed for hemostasis. Patient tolerated the procedure well and remained hemodynamically stable throughout. No complications were encountered and no significant blood loss  encountered. FINDINGS: Superior mesenteric artery angiogram demonstrates normal filling of the arcades an the ileal colicky artery. Inferior pancreaticoduodenal branches contribute to pseudoaneurysm near the previously placed micro coil pack of the gastric duodenal artery. There is filling of the gastroepiploic artery via these inferior branches. After coil embolization of the proximal collateral vessel there is no continuous flow to the pseudoaneurysm. Because the coil pack on today's study is proximal to the gastroepiploic branch, there is potential for reversal of flow into the region of the pseudoaneurysm at the isolated distal GDA. Future embolization may be difficult to reach this site Angiogram of the celiac artery demonstrates no filling of the gastroduodenal artery beyond the coil pack with no collateral contribution to the distal GDA from the celiac artery. Status post embolization, final angiogram demonstrates no continuous  flow to the distal GDA via the embolized pancreaticoduodenal arcade. IMPRESSION: Status post ultrasound-guided right common femoral artery access with mesenteric angiogram and coil embolization of inferior pancreaticoduodenal arcade branch contributing to pseudoaneurysm of distal GDA. Deployment of Exoseal device for hemostasis. Ultrasound-guided right common femoral vein central venous access. Signed, Dulcy Fanny. Earleen Newport, DO [facility] Radiology Electronically Signed   By: Corrie Mckusick D.O.   On: 08/31/2015 10:51   Dg Chest Portable 1 View  Result Date: 08/29/2015 CLINICAL DATA:  GI bleed. Patient has pancreatic carcinoma. Some shortness of breath. EXAM: PORTABLE CHEST 1 VIEW COMPARISON:  06/01/2015 FINDINGS: Cardiac silhouette is normal in size. No mediastinal or hilar masses or convincing adenopathy. Left anterior chest wall dual lumen Port-A-Cath is stable with its catheter tip near the caval atrial junction. There are prominent  bronchovascular markings. Minor scarring or atelectasis noted at the right lateral lung base. Lungs otherwise clear. No pleural effusion or pneumothorax. Bony thorax is demineralized but grossly intact. IMPRESSION: No acute cardiopulmonary disease. Electronically Signed   By: Lajean Manes M.D.   On: 08/29/2015 16:08   Ir Embo Otilio Saber Hemorr Lymph [place] Guide Roadmapping  Result Date: 08/31/2015 INDICATION: 80 year old male with known pancreatic carcinoma. 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 This was coil embolized May of 2017. He presents again with melena and upper GI bleeding. Endoscopies performed on the day of the procedure demonstrates active hemorrhage in the duodenum. Given his hemodynamic instability and the endoscopy findings, angiogram and potential embolization indicated. EXAM: SELECTIVE VISCERAL ARTERIOGRAPHY; IR ULTRASOUND GUIDANCE VASC ACCESS RIGHT; ADDITIONAL ARTERIOGRAPHY; IR RIGHT FLOURO GUIDE CV LINE; IR EMBO ART VEN HEMORR LYMPH EXTRAV INC GUIDE ROADMAPPING; ARTERIOGRAPHY Ultrasound-guided right common femoral central venous access. MEDICATIONS: None ANESTHESIA/SEDATION: A total of Versed 0 mg and Fentanyl 25 mcg was administered intravenously. No conscious sedation The patient's level of consciousness and vital signs were monitored continuously by radiology nursing throughout the procedure under my direct supervision. CONTRAST:  150 cc FLUOROSCOPY TIME:  Fluoroscopy Time: 48 minutes 0 seconds (2,190 mGy). COMPLICATIONS: None PROCEDURE: Informed consent was obtained from the patient following explanation of the procedure, risks, benefits and alternatives. The patient understands, agrees and consents for the procedure. All questions were addressed. A time out was performed prior to the initiation of the procedure. Maximal barrier sterile technique utilized including caps, mask, sterile gowns, sterile gloves, large sterile drape, hand hygiene, and  Betadine prep. Ultrasound survey of the right inguinal region was performed with images stored and sent to PACs. A micropuncture needle was used access the right common femoral artery under ultrasound. With excellent arterial blood flow returned, and an .018 micro wire was passed through the needle, observed enter the abdominal aorta under fluoroscopy. The needle was removed, and a micropuncture sheath was placed over the wire. The inner dilator and wire were removed, and an 035 Bentson wire was advanced under fluoroscopy into the abdominal aorta. The sheath was removed and a standard 5 [place] vascular sheath was placed. The dilator was removed and the sheath was flushed. Ultrasound guidance was then used to access the right common femoral vein. A single wall needle was used with the saline syringe. Once venous blood was aspirated, 035 wire was passed through the needle, observed enter the IVC under fluoroscopy. The needle was removed, dilation of the soft tissues was performed, and a triple-lumen central catheter was placed over the wire. The lumen were flushed. [employer]  catheter was then advanced through the arterial sheath over the Bentson wire to the level of the superior mesenteric artery. SMA angiogram was performed. Increase magnification was used for planning angiogram and then a micro catheter system was used in an attempt to select the origin of the inferior pancreaticoduodenal arcade contributing to the abnormality in the region of the prior coil mass. The acute angle of the vessel origin proved difficult to select with the micro catheter and base catheter combination, and os the base catheter was exchanged over the 035 wire for a Mickelson catheter. Again, difficulty achieving micro catheter placement was encountered with the Mickelson catheter as the base catheter. The Mickelson catheter was then exchanged for the Cobra catheter, which was advanced over the micro catheter system several cm into the origin  of the SMA. With exaggerated curve on the micro wire, the targeted vessel was selected. The third order branch vessel again proved difficult to navigate with an acute angle to the patient's right after the initial leftward branch. Thus, with the micro wire micro catheter were advanced into a distal branch and the base catheter was advanced into the origin of the second order branch. The third order branch contributing to the pseudoaneurysm was then selected. Micro catheter was advanced as distal as possible, with coil embolization performed with 2 separate 2 mm coil. Micro catheter was withdrawn and then the Cobra catheter was used to select the celiac artery. Celiac angiogram performed. Superior mesenteric artery angiogram was then repeated, to assure no continuous or further flow to the pseudoaneurysm. Limited angiogram at the right common femoral artery access was performed. All catheters and wires were removed. Exoseal device was then deployed for hemostasis. Patient tolerated the procedure well and remained hemodynamically stable throughout. No complications were encountered and no significant blood loss encountered. FINDINGS: Superior mesenteric artery angiogram demonstrates normal filling of the arcades an the ileal colicky artery. Inferior pancreaticoduodenal branches contribute to pseudoaneurysm near the previously placed micro coil pack of the gastric duodenal artery. There is filling of the gastroepiploic artery via these inferior branches. After coil embolization of the proximal collateral vessel there is no continuous flow to the pseudoaneurysm. Because the coil pack on today's study is proximal to the gastroepiploic branch, there is potential for reversal of flow into the region of the pseudoaneurysm at the isolated distal GDA. Future embolization may be difficult to reach this site Angiogram of the celiac artery demonstrates no filling of the gastroduodenal artery beyond the coil pack with no  collateral contribution to the distal GDA from the celiac artery. Status post embolization, final angiogram demonstrates no continuous flow to the distal GDA via the embolized pancreaticoduodenal arcade. IMPRESSION: Status post ultrasound-guided right common femoral artery access with mesenteric angiogram and coil embolization of inferior pancreaticoduodenal arcade branch contributing to pseudoaneurysm of distal GDA. Deployment of Exoseal device for hemostasis. Ultrasound-guided right common femoral vein central venous access. Signed, Dulcy Fanny. Earleen Newport, DO [facility] Radiology Electronically Signed   By: Corrie Mckusick D.O.   On: 08/31/2015 10:51    Labs:  CBC:  Recent Labs  08/30/15 0137 08/30/15 2200 08/31/15 0400 08/31/15 1442  WBC 10.7* 9.4 8.5 9.9  HGB 8.2* 8.5* 8.0* 8.6*  HCT 24.6* 26.2* 24.6* 26.2*  PLT 331 340 332 347    COAGS:  Recent Labs  06/02/15 0311 06/03/15 1335 08/29/15 1650 08/30/15 0137  INR 1.33 1.28 1.31 1.40  APTT 31  --   --   --  BMP:  Recent Labs  08/29/15 1650 08/30/15 0137 08/30/15 2200 08/31/15 0400  NA 134* 140 140 145  K 3.9 4.3 4.2 4.4  CL 109 114* 117* 118*  CO2 20* 21* 19* 20*  GLUCOSE 121* 102* 111* 102*  BUN 23* 18 17 16   CALCIUM 7.9* 8.2* 7.7* 8.0*  CREATININE 2.22* 1.98* 1.93* 1.55*  GFRNONAA 27* 31* 32* 41*  GFRAA 31* 36* 37* 48*    LIVER FUNCTION TESTS:  Recent Labs  06/04/15 0600 06/05/15 0630 08/11/15 0714 08/29/15 1650  BILITOT 0.7 0.6 0.4 0.4  AST 17 18 26  51*  ALT 14* 12* 30 32  ALKPHOS 89 95 126 103  PROT 4.6* 4.9* 7.6 6.3*  ALBUMIN 2.1* 2.2* 3.8 2.4*    Assessment and Plan:  GI bleed with embolization 8/9 Call if need [place]  Electronically Signed: Kabella Cassidy A 08/31/2015, 3:17 PM   I spent a total of 15 Minutes at the the patient's bedside AND on the patient's hospital floor or unit, greater than 50% of which was counseling/coordinating care for GI  bleed embo

## 2015-08-31 NOTE — Plan of Care (Deleted)
Called William Rivera about medical examiner(could not give me a name) also called pathology.

## 2015-08-31 NOTE — Progress Notes (Addendum)
Daily Rounding Note  08/31/2015, 8:23 AM  LOS: 2 days   SUBJECTIVE:   Chief complaint: hematochezia.  None recurrent last night or this AM.  No BMs yet.  No N/V Feels well, thirsty and still NPO.  Right abd pain intermittently.   OBJECTIVE:         Vital signs in last 24 hours:    Temp:  [97.7 F (36.5 C)-98.1 F (36.7 C)] 97.8 F (36.6 C) (08/10 0751) Pulse Rate:  [64-194] 84 (08/10 0800) Resp:  [14-25] 14 (08/10 0800) BP: (56-147)/(41-84) 121/60 (08/10 0800) SpO2:  [76 %-100 %] 100 % (08/10 0800) Weight:  [72.3 kg (159 lb 6.3 oz)] 72.3 kg (159 lb 6.3 oz) (08/10 0500) Last BM Date:  (PTA) Filed Weights   08/29/15 2200 08/30/15 0700 08/31/15 0500  Weight: 75.2 kg (165 lb 11.2 oz) 71.3 kg (157 lb 3.2 oz) 72.3 kg (159 lb 6.3 oz)   General: pleasant, pale, alert, comfortable.  Frail but not ill looking   Heart: RRR Chest: clear bil.  No dyspnea or cough Abdomen: soft, NT  Extremities: no CCE.   Feet warm and well perfused Neuro/Psych:  Pleasant, oriented x 3.  Fully alert.   Lab Results:  Recent Labs  08/30/15 0137 08/30/15 2200 08/31/15 0400  WBC 10.7* 9.4 8.5  HGB 8.2* 8.5* 8.0*  HCT 24.6* 26.2* 24.6*  PLT 331 340 332   BMET  Recent Labs  08/30/15 0137 08/30/15 2200 08/31/15 0400  NA 140 140 145  K 4.3 4.2 4.4  CL 114* 117* 118*  CO2 21* 19* 20*  GLUCOSE 102* 111* 102*  BUN 18 17 16   CREATININE 1.98* 1.93* 1.55*  CALCIUM 8.2* 7.7* 8.0*      ASSESMENT:   * Bleeding duodenal ulcer, recurrent.  S/p GDA embolization 05/2015.   08/30/15 EGD: pumping hemorrhage from duodenal ulcer.  08/30/15 Coil embolization of collateral vasculature to the pancreatico-duodenal arcade to treat a pseudoaneurysm near the 2nd portion of the duodenum. On Protonix drip day 2.    Pt is stable  *  Blood loss anemia.  Hgb stable in setting of PRBCs x 2, 1 of these last night, the other on 8/8 in PM.    *  Metastatic  pancreatic cancer. Non-op candidate due to lung mets, pt declines further palliative chemo.   *  SVT.      *  AKI.  Creat improved.   PLAN   *  Allow clears.  Stay on PPI for 72 hours.  Monitor CBC. Keep in ICU for now.      Azucena Freed  08/31/2015, 8:23 AM Pager: [phone]    [place] [facility] Attending   I have taken an interval history, reviewed the chart and examined the patient. I agree with the Advanced Practitioner's note, impression and recommendations.     Continues to do ok thanks to IR help. Will advance diet to full liquids.  He is not obstructed (GOO) but I do have some concern that the tumor may cause that so would not go to a regular diet anytime soon but stay soft to chopped if he is ok with full liquids and allso avoid raw vegetables I would leave him on a chronic PPI at dc We will sign off - no [place] GI f/u needed - has primary GI in [place] but do not think he needs GI f/u there either Needs Hgb f/u with PCP or oncology   Glendell Docker  Simonne Maffucci, MD, [facility] Gastroenterology [phone] (pager) [phone] after 5 PM, weekends and holidays  08/31/2015 5:04 PM

## 2015-09-01 LAB — CBC
HCT: 25.3 % — ABNORMAL LOW (ref 39.0–52.0)
HCT: 25.8 % — ABNORMAL LOW (ref 39.0–52.0)
Hemoglobin: 8.3 g/dL — ABNORMAL LOW (ref 13.0–17.0)
Hemoglobin: 8.3 g/dL — ABNORMAL LOW (ref 13.0–17.0)
MCH: 29.9 pg (ref 26.0–34.0)
MCH: 29.5 pg (ref 26.0–34.0)
MCHC: 32.8 g/dL (ref 30.0–36.0)
MCHC: 32.2 g/dL (ref 30.0–36.0)
MCV: 91 fL (ref 78.0–100.0)
MCV: 91.8 fL (ref 78.0–100.0)
PLATELETS: 353 10*3/uL (ref 150–400)
Platelets: 327 10*3/uL (ref 150–400)
RBC: 2.78 MIL/uL — AB (ref 4.22–5.81)
RBC: 2.81 MIL/uL — ABNORMAL LOW (ref 4.22–5.81)
RDW: 16.2 % — AB (ref 11.5–15.5)
RDW: 15.9 % — ABNORMAL HIGH (ref 11.5–15.5)
WBC: 10.7 10*3/uL — ABNORMAL HIGH (ref 4.0–10.5)
WBC: 10.3 10*3/uL (ref 4.0–10.5)

## 2015-09-01 LAB — BASIC METABOLIC PANEL
Anion gap: 5 (ref 5–15)
BUN: 13 mg/dL (ref 6–20)
CALCIUM: 7.9 mg/dL — AB (ref 8.9–10.3)
CO2: 21 mmol/L — ABNORMAL LOW (ref 22–32)
CREATININE: 1.7 mg/dL — AB (ref 0.61–1.24)
Chloride: 113 mmol/L — ABNORMAL HIGH (ref 101–111)
GFR calc Af Amer: 43 mL/min — ABNORMAL LOW (ref >60)
GFR, EST NON AFRICAN AMERICAN: 37 mL/min — AB (ref >60)
GLUCOSE: 120 mg/dL — AB (ref 65–99)
Potassium: 4.3 mmol/L (ref 3.5–5.1)
SODIUM: 139 mmol/L (ref 135–145)

## 2015-09-01 NOTE — Progress Notes (Signed)
PROGRESS NOTE  William Rivera [id number] DOB: [birth year] DOA: 08/29/2015 PCP: [name], MD  HPI/Recap of past 24 hours:  S/p embolization, hgb stable, had two bm , reported old blood Vital stable, wife in room  Assessment/Plan: Principal Problem:   Acute GI bleeding Active Problems:   Essential hypertension   BARRETTS ESOPHAGUS   Adenocarcinoma of pancreas ([place])   Acute blood loss anemia   AKI (acute kidney injury) ([place])   Duodenal ulcer with hemorrhage and obstruction   Paroxysmal SVT (supraventricular tachycardia) ([facility])  Gi bleed,  concerning for arterial bleed, getting prbc transfusion, ppi drip,   high risk of decompensation due to significant arterial blood , patient is being transferred to ICU care. s/p egd , s/p embolization by IR, now stabilized, patient is tranferred back to hospitalist service  Acute blood loss anemia: better, s/p prbc transfusion  Paroxysmal SVT; s/p adenosine , continue lopressor  Aki; cr 2.22 on admission, 1.98 the next morning, repeat in am, renal dosing meds, ua noinfection  Metastatic pancreatic cancer, not able to tolerate chemo, s/p stenting, over all poor prognosis, patient is DNR, and is in the process of getting hospice involved, but does want to GI bleed treated if possible.  Code Status: DNR  Family Communication: patient and wife in room  Disposition Plan: home in am   Consultants:  GI  IR  ICU  Procedures:  prbc transfusionx1 unit on 8/8  egd on 8/9 8/9 Status post ultrasound-guided right common femoral artery access with mesenteric angiogram and coil embolization of inferior pancreaticoduodenal arcade branch contributing to pseudoaneurysm of distal GDA  Antibiotics:  none   Objective: BP 136/78 (BP Location: Left Arm)   Pulse 84   Temp 98.1 F (36.7 C)   Resp 18   Ht 5\' 9"  (1.753 m)   Wt 74.9 kg (165 lb 1.6 oz)   SpO2 100%   BMI 24.38 kg/m   Intake/Output Summary (Last 24 hours) at  09/01/15 1806 Last data filed at 09/01/15 1724  Gross per 24 hour  Intake             3870 ml  Output             1325 ml  Net             2545 ml   Filed Weights   08/31/15 0500 08/31/15 1637 09/01/15 0507  Weight: 72.3 kg (159 lb 6.3 oz) 75.8 kg (167 lb 1.7 oz) 74.9 kg (165 lb 1.6 oz)    Exam:   General:  Pale,  NAD  Cardiovascular: RRR  Respiratory: CTABL  Abdomen: some chronic right sided tenderness, Soft/ND, positive BS  Musculoskeletal: No Edema  Neuro: aaox3  Data Reviewed: Basic Metabolic Panel:  Recent Labs Lab 08/29/15 1650 08/30/15 0137 08/30/15 2200 08/31/15 0400 09/01/15 0259  NA 134* 140 140 145 139  K 3.9 4.3 4.2 4.4 4.3  CL 109 114* 117* 118* 113*  CO2 20* 21* 19* 20* 21*  GLUCOSE 121* 102* 111* 102* 120*  BUN 23* 18 17 16 13   CREATININE 2.22* 1.98* 1.93* 1.55* 1.70*  CALCIUM 7.9* 8.2* 7.7* 8.0* 7.9*   Liver Function Tests:  Recent Labs Lab 08/29/15 1650  AST 51*  ALT 32  ALKPHOS 103  BILITOT 0.4  PROT 6.3*  ALBUMIN 2.4*    Recent Labs Lab 08/29/15 1650  LIPASE 25   No results for input(s): AMMONIA in the last 168 hours. CBC:  Recent Labs Lab 08/29/15  1650  08/30/15 2200 08/31/15 0400 08/31/15 1442 09/01/15 0259 09/01/15 1633  WBC 13.6*  < > 9.4 8.5 9.9 10.7* 10.3  NEUTROABS 11.6*  --   --   --   --   --   --   HGB 7.7*  < > 8.5* 8.0* 8.6* 8.3* 8.3*  HCT 23.1*  < > 26.2* 24.6* 26.2* 25.3* 25.8*  MCV 92.4  < > 91.6 90.4 91.0 91.0 91.8  PLT 516*  < > 340 332 347 353 327  < > = values in this interval not displayed. Cardiac Enzymes:    Recent Labs Lab 08/30/15 2200  TROPONINI 0.04*   BNP (last 3 results) No results for input(s): BNP in the last 8760 hours.  ProBNP (last 3 results) No results for input(s): PROBNP in the last 8760 hours.  CBG: No results for input(s): GLUCAP in the last 168 hours.  Recent Results (from the past 240 hour(s))  Surgical pcr screen     Status: None   Collection Time: 08/30/15   5:06 PM  Result Value Ref Range Status   MRSA, PCR NEGATIVE NEGATIVE Final   Staphylococcus aureus NEGATIVE NEGATIVE Final    Comment:        The Xpert SA Assay (FDA approved for NASAL specimens in patients over 78 years of age), is one component of a comprehensive surveillance program.  Test performance has been validated by [facility] for patients greater than or equal to 78 year old. It is not intended to diagnose infection nor to guide or monitor treatment.      Studies: No results found.  Scheduled Meds: . sodium chloride   Intravenous Once  . metoprolol tartrate  25 mg Oral BID  . sucralfate  1 g Oral TID    Continuous Infusions: . sodium chloride 75 mL/hr at 09/01/15 1706  . pantoprozole (PROTONIX) infusion 8 mg/hr (09/01/15 1724)     Time spent: 16mins  Nolie Bignell MD, PhD  [facility] Pager [phone]. If 7PM-7AM, please contact night-coverage at [email or web address], password [facility] 09/01/2015, 6:06 PM  LOS: 3 days

## 2015-09-02 LAB — CBC
HCT: 24.7 % — ABNORMAL LOW (ref 39.0–52.0)
HEMOGLOBIN: 8 g/dL — AB (ref 13.0–17.0)
MCH: 29.6 pg (ref 26.0–34.0)
MCHC: 32.4 g/dL (ref 30.0–36.0)
MCV: 91.5 fL (ref 78.0–100.0)
Platelets: 297 10*3/uL (ref 150–400)
RBC: 2.7 MIL/uL — ABNORMAL LOW (ref 4.22–5.81)
RDW: 15.7 % — ABNORMAL HIGH (ref 11.5–15.5)
WBC: 8.8 10*3/uL (ref 4.0–10.5)

## 2015-09-02 LAB — BASIC METABOLIC PANEL
Anion gap: 6 (ref 5–15)
BUN: 9 mg/dL (ref 6–20)
CHLORIDE: 112 mmol/L — AB (ref 101–111)
CO2: 20 mmol/L — ABNORMAL LOW (ref 22–32)
Calcium: 7.9 mg/dL — ABNORMAL LOW (ref 8.9–10.3)
Creatinine, Ser: 1.64 mg/dL — ABNORMAL HIGH (ref 0.61–1.24)
GFR calc Af Amer: 45 mL/min — ABNORMAL LOW (ref >60)
GFR calc non Af Amer: 39 mL/min — ABNORMAL LOW (ref >60)
Glucose, Bld: 97 mg/dL (ref 65–99)
POTASSIUM: 4 mmol/L (ref 3.5–5.1)
SODIUM: 138 mmol/L (ref 135–145)

## 2015-09-02 LAB — TYPE AND SCREEN
ABO/RH(D): O POS
Antibody Screen: NEGATIVE
UNIT DIVISION: 0
Unit division: 0

## 2015-09-02 NOTE — Discharge Summary (Signed)
Discharge Summary  William Rivera [id number] DOB: 03/03/1935  PCP: [name], MD  Admit date: 08/29/2015 Discharge date: 09/02/2015  Time spent: <34mins  Recommendations for Outpatient Follow-up:  1. F/u with PMD Dr Sharilyn Sites within a week  for hospital discharge follow up, repeat cbc/bmp at follow up 2. F/u with oncology 3. F/u with cardiology Dr Orpah Greek  For svt  Discharge Diagnoses:  Active Hospital Problems   Diagnosis Date Noted  . Acute GI bleeding 08/29/2015  . Paroxysmal SVT (supraventricular tachycardia) ([place]) 08/30/2015  . Duodenal ulcer with hemorrhage and obstruction   . Acute blood loss anemia 06/02/2015  . AKI (acute kidney injury) ([place]) 06/02/2015  . Adenocarcinoma of pancreas ([place]) 09/05/2014  . BARRETTS ESOPHAGUS 02/26/2010  . Essential hypertension 09/23/2008    Resolved Hospital Problems   Diagnosis Date Noted Date Resolved  No resolved problems to display.    Discharge Condition: stable  Diet recommendation: heart healthy  Filed Weights   09/01/15 0507 09/02/15 0220 09/02/15 0454  Weight: 74.9 kg (165 lb 1.6 oz) 78.9 kg (174 lb) 78.9 kg (173 lb 15.1 oz)    History of present illness:  Chief Complaint:   bleeding  HPI: William Rivera is a 80 y.o. male with medical history significant of pancreatic cancer, prev GIB requiring embolization to duodenum by IR at [facility], GERD, HTN comes in with one day of bloody stools.  Pt says it started yesterday with dark maroonish colored stool and has become darker.  He is having epigastric discomfort.  No vomiting.  He started to get weak today and called his local GI doctor dr fields who advised him to use a suppository and to come to the ED if it continued.  It continued, his vitals are stable but his hgb has dropped to 7.7 from 12 in the last couple of months.  He is not on any blood thinners.  Dr fields called who advised transfer to [place] for his previous history of requiring  embolization.   Hospital Course:  Principal Problem:   Acute GI bleeding Active Problems:   Essential hypertension   BARRETTS ESOPHAGUS   Adenocarcinoma of pancreas (HCC)   Acute blood loss anemia   AKI (acute kidney injury) ([place])   Duodenal ulcer with hemorrhage and obstruction   Paroxysmal SVT (supraventricular tachycardia) ([facility])   Gi bleed,  concerning for arterial bleed, s/p 3untis prbc transfusion, treated with ppi drip,   high risk of decompensation due to significant arterial blood , patient was transferred to ICU care. s/p egd , s/p embolization by IR, now stabilized, patient is tranferred back to hospitalist service Continue to be stable, tolerating diet, discharge home on oral ppi.  Acute blood loss anemia: better, s/p prbc transfusion, hgb stable at discharge  Paroxysmal SVT; s/p adenosine , continue lopressor. Outpatient follow up with cardiology  [place]; cr 2.22 on admission,  cr 1.6 at discharge,  renal dosing meds, ua noinfection  Metastatic pancreatic cancer, not able to tolerate chemo, s/p stenting, over all poor prognosis, patient is DNR, and is in the process of getting hospice involved, but does want to GI bleed treated if possible.  Code Status: DNR  Family Communication: patient and wife in room  Disposition Plan: home on 8/12   Consultants:  GI  IR  ICU  Procedures:  prbc transfusionx1 unit on 8/8  egd on 8/9 8/9 Status post ultrasound-guided right common femoral artery access with mesenteric angiogram and coil embolization of inferior pancreaticoduodenal  arcade branch contributing to pseudoaneurysm of distal GDA  Antibiotics:  none   Discharge Exam: BP 125/73 (BP Location: Left Arm)   Pulse 86   Temp 98.2 F (36.8 C)   Resp 20   Ht 5\' 9"  (1.753 m)   Wt 78.9 kg (173 lb 15.1 oz)   SpO2 96%   BMI 25.69 kg/m     General:  Pale,  NAD  Cardiovascular: RRR  Respiratory: CTABL  Abdomen: some chronic right sided  tenderness, Soft/ND, positive BS  Musculoskeletal: No Edema  Neuro: aaox3  Discharge Instructions You were cared for by a hospitalist during your hospital stay. If you have any questions about your discharge medications or the care you received while you were in the hospital after you are discharged, you can call the unit and asked to speak with the hospitalist on call if the hospitalist that took care of you is not available. Once you are discharged, your primary care physician will handle any further medical issues. Please note that NO REFILLS for any discharge medications will be authorized once you are discharged, as it is imperative that you return to your primary care physician (or establish a relationship with a primary care physician if you do not have one) for your aftercare needs so that they can reassess your need for medications and monitor your lab values.  Discharge Instructions    Diet - low sodium heart healthy    Complete by:  As directed   Increase activity slowly    Complete by:  As directed       Medication List    STOP taking these medications   ciprofloxacin 500 MG tablet Commonly known as:  CIPRO   metroNIDAZOLE 500 MG tablet Commonly known as:  FLAGYL     TAKE these medications   feeding supplement Liqd Take 1 Container by mouth 3 (three) times daily with meals.   GAS RELIEF 125 MG Caps Generic drug:  Simethicone TAKE ONE TO TWO SOFTGELS BY MOUTH DAILY AS NEEDED FOR FLATULENCE   hydrocortisone 2.5 % rectal cream Commonly known as:  ANUSOL-HC Place 1 application rectally 2 (two) times daily.   metoprolol succinate 100 MG 24 hr tablet Commonly known as:  TOPROL-XL Take 100 mg by mouth daily.   Oxycodone HCl 10 MG Tabs Take 1 tablet (10 mg total) by mouth every 4 (four) hours as needed. What changed:  when to take this  reasons to take this   pantoprazole 40 MG tablet Commonly known as:  PROTONIX Take 1 tablet (40 mg total) by mouth 2 (two)  times daily. What changed:  when to take this   sucralfate 1 g tablet Commonly known as:  CARAFATE Take 1 g by mouth 3 (three) times daily.      Allergies  Allergen Reactions  . Doxycycline Itching    Severe itching   . Colestipol Itching  . Doxycycline Rash   Follow-up Information    Orpah Greek Follow up in 2 week(s).   Why:  svt Contact information: cardiology       Molli Hazard, MD Follow up in 2 week(s).   Specialties:  Hematology and Oncology, Oncology Contact information: [place] [address] [phone]        [name], MD Follow up in 1 week(s).   Specialty:  Family Medicine Why:  hospital discharge follow up Contact information: [address] [place] [id number] [phone]  The results of significant diagnostics from this hospitalization (including imaging, microbiology, ancillary and laboratory) are listed below for reference.    Significant Diagnostic Studies: Ct Abdomen Pelvis Wo Contrast  Result Date: 08/11/2015 CLINICAL DATA:  Pancreatic cancer. EXAM: CT ABDOMEN AND PELVIS WITHOUT CONTRAST TECHNIQUE: Multidetector CT imaging of the abdomen and pelvis was performed following the standard protocol without IV contrast. COMPARISON:  06/01/2015 FINDINGS: Lower chest: Innumerable bilateral pulmonary nodules are again noted. They are more conspicuous and appear slightly progressed in the interval. 4 mm posterior right lower lobe pulmonary nodule seen on previous study is now 6 mm. 9 mm subpleural nodule measured on the previous study is now 15 mm. Hepatobiliary: Pneumobilia again noted in the left hepatic lobe. No change in the probable cyst along the gallbladder fossa. Gallbladder unremarkable. Common bile duct stent remains in place. Pancreas: Discrete pancreatic head mass not apparent on this study without intravenous contrast and measurements on previous study included the duodenum. Overall,  imaging appearance at the level of the pancreatic head does not appear substantially changed. Patient is noted to have new embolization coils in the gastroduodenal artery. Spleen: No splenomegaly. No focal mass lesion. Adrenals/Urinary Tract: No adrenal nodule or mass. Lymph kidney is markedly scarred and atrophic. Cortical scarring is seen in the right kidney. No hydronephrosis on either side. No evidence for hydroureter. The urinary bladder appears normal for the degree of distention. Stomach/Bowel: Stomach is mildly distended. Duodenum is nondistended. No small bowel wall thickening. No small bowel dilatation. The terminal ileum is normal. The appendix is normal. No gross colonic mass. No colonic wall thickening. No substantial diverticular change. Vascular/Lymphatic: There is abdominal aortic atherosclerosis without aneurysm. Haziness/ edema in the root of the small bowel mesentery is stable to slightly progressed in the interval. No bulky abdominal lymphadenopathy. No pelvic sidewall lymphadenopathy. Reproductive: The prostate gland and seminal vesicles have normal imaging features. Other: No intraperitoneal free fluid. Musculoskeletal: Bone windows reveal no worrisome lytic or sclerotic osseous lesions. IMPRESSION: 1. No substantial change in pancreatic head mass. 2. Innumerable tiny bilateral pulmonary nodules appear progressed in the interval and some of the larger, more discrete nodules measured previously have progressed. 3. Interval embolization of the gastroduodenal artery. Electronically Signed   By: Misty Stanley M.D.   On: 08/11/2015 09:41   Ir Angiogram Visceral Selective  Result Date: 08/31/2015 INDICATION: 80 year old male with known pancreatic carcinoma. 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 This was coil embolized May of 2017. He presents again with melena and upper GI bleeding. Endoscopies performed on the day of the procedure demonstrates active  hemorrhage in the duodenum. Given his hemodynamic instability and the endoscopy findings, angiogram and potential embolization indicated. EXAM: SELECTIVE VISCERAL ARTERIOGRAPHY; IR ULTRASOUND GUIDANCE VASC ACCESS RIGHT; ADDITIONAL ARTERIOGRAPHY; IR RIGHT FLOURO GUIDE CV LINE; IR EMBO ART VEN HEMORR LYMPH EXTRAV INC GUIDE ROADMAPPING; ARTERIOGRAPHY Ultrasound-guided right common femoral central venous access. MEDICATIONS: None ANESTHESIA/SEDATION: A total of Versed 0 mg and Fentanyl 25 mcg was administered intravenously. No conscious sedation The patient's level of consciousness and vital signs were monitored continuously by radiology nursing throughout the procedure under my direct supervision. CONTRAST:  150 cc FLUOROSCOPY TIME:  Fluoroscopy Time: 48 minutes 0 seconds (2,190 mGy). COMPLICATIONS: None PROCEDURE: Informed consent was obtained from the patient following explanation of the procedure, risks, benefits and alternatives. The patient understands, agrees and consents for the procedure. All questions were addressed. A time out was performed prior to the initiation of the  procedure. Maximal barrier sterile technique utilized including caps, mask, sterile gowns, sterile gloves, large sterile drape, hand hygiene, and Betadine prep. Ultrasound survey of the right inguinal region was performed with images stored and sent to PACs. A micropuncture needle was used access the right common femoral artery under ultrasound. With excellent arterial blood flow returned, and an .018 micro wire was passed through the needle, observed enter the abdominal aorta under fluoroscopy. The needle was removed, and a micropuncture sheath was placed over the wire. The inner dilator and wire were removed, and an 035 Bentson wire was advanced under fluoroscopy into the abdominal aorta. The sheath was removed and a standard 5 [place] vascular sheath was placed. The dilator was removed and the sheath was flushed. Ultrasound guidance was  then used to access the right common femoral vein. A single wall needle was used with the saline syringe. Once venous blood was aspirated, 035 wire was passed through the needle, observed enter the IVC under fluoroscopy. The needle was removed, dilation of the soft tissues was performed, and a triple-lumen central catheter was placed over the wire. The lumen were flushed. Cobra catheter was then advanced through the arterial sheath over the Bentson wire to the level of the superior mesenteric artery. SMA angiogram was performed. Increase magnification was used for planning angiogram and then a micro catheter system was used in an attempt to select the origin of the inferior pancreaticoduodenal arcade contributing to the abnormality in the region of the prior coil mass. The acute angle of the vessel origin proved difficult to select with the micro catheter and base catheter combination, and os the base catheter was exchanged over the 035 wire for a Mickelson catheter. Again, difficulty achieving micro catheter placement was encountered with the Mickelson catheter as the base catheter. The Mickelson catheter was then exchanged for the Cobra catheter, which was advanced over the micro catheter system several cm into the origin of the SMA. With exaggerated curve on the micro wire, the targeted vessel was selected. The third order branch vessel again proved difficult to navigate with an acute angle to the patient's right after the initial leftward branch. Thus, with the micro wire micro catheter were advanced into a distal branch and the base catheter was advanced into the origin of the second order branch. The third order branch contributing to the pseudoaneurysm was then selected. Micro catheter was advanced as distal as possible, with coil embolization performed with 2 separate 2 mm coil. Micro catheter was withdrawn and then the Cobra catheter was used to select the celiac artery. Celiac angiogram performed. Superior  mesenteric artery angiogram was then repeated, to assure no continuous or further flow to the pseudoaneurysm. Limited angiogram at the right common femoral artery access was performed. All catheters and wires were removed. Exoseal device was then deployed for hemostasis. Patient tolerated the procedure well and remained hemodynamically stable throughout. No complications were encountered and no significant blood loss encountered. FINDINGS: Superior mesenteric artery angiogram demonstrates normal filling of the arcades an the ileal colicky artery. Inferior pancreaticoduodenal branches contribute to pseudoaneurysm near the previously placed micro coil pack of the gastric duodenal artery. There is filling of the gastroepiploic artery via these inferior branches. After coil embolization of the proximal collateral vessel there is no continuous flow to the pseudoaneurysm. Because the coil pack on today's study is proximal to the gastroepiploic branch, there is potential for reversal of flow into the region of the pseudoaneurysm at the isolated distal GDA. Future embolization  may be difficult to reach this site Angiogram of the celiac artery demonstrates no filling of the gastroduodenal artery beyond the coil pack with no collateral contribution to the distal GDA from the celiac artery. Status post embolization, final angiogram demonstrates no continuous flow to the distal GDA via the embolized pancreaticoduodenal arcade. IMPRESSION: Status post ultrasound-guided right common femoral artery access with mesenteric angiogram and coil embolization of inferior pancreaticoduodenal arcade branch contributing to pseudoaneurysm of distal GDA. Deployment of Exoseal device for hemostasis. Ultrasound-guided right common femoral vein central venous access. Signed, Dulcy Fanny. Earleen Newport, DO [facility] Radiology Electronically Signed   By: Corrie Mckusick D.O.   On: 08/31/2015 10:51   Ir  Angiogram Visceral Selective  Result Date: 08/31/2015 INDICATION: 80 year old male with known pancreatic carcinoma. 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 This was coil embolized May of 2017. He presents again with melena and upper GI bleeding. Endoscopies performed on the day of the procedure demonstrates active hemorrhage in the duodenum. Given his hemodynamic instability and the endoscopy findings, angiogram and potential embolization indicated. EXAM: SELECTIVE VISCERAL ARTERIOGRAPHY; IR ULTRASOUND GUIDANCE VASC ACCESS RIGHT; ADDITIONAL ARTERIOGRAPHY; IR RIGHT FLOURO GUIDE CV LINE; IR EMBO ART VEN HEMORR LYMPH EXTRAV INC GUIDE ROADMAPPING; ARTERIOGRAPHY Ultrasound-guided right common femoral central venous access. MEDICATIONS: None ANESTHESIA/SEDATION: A total of Versed 0 mg and Fentanyl 25 mcg was administered intravenously. No conscious sedation The patient's level of consciousness and vital signs were monitored continuously by radiology nursing throughout the procedure under my direct supervision. CONTRAST:  150 cc FLUOROSCOPY TIME:  Fluoroscopy Time: 48 minutes 0 seconds (2,190 mGy). COMPLICATIONS: None PROCEDURE: Informed consent was obtained from the patient following explanation of the procedure, risks, benefits and alternatives. The patient understands, agrees and consents for the procedure. All questions were addressed. A time out was performed prior to the initiation of the procedure. Maximal barrier sterile technique utilized including caps, mask, sterile gowns, sterile gloves, large sterile drape, hand hygiene, and Betadine prep. Ultrasound survey of the right inguinal region was performed with images stored and sent to PACs. A micropuncture needle was used access the right common femoral artery under ultrasound. With excellent arterial blood flow returned, and an .018 micro wire was passed through the needle, observed enter the abdominal aorta under fluoroscopy.  The needle was removed, and a micropuncture sheath was placed over the wire. The inner dilator and wire were removed, and an 035 Bentson wire was advanced under fluoroscopy into the abdominal aorta. The sheath was removed and a standard 5 [place] vascular sheath was placed. The dilator was removed and the sheath was flushed. Ultrasound guidance was then used to access the right common femoral vein. A single wall needle was used with the saline syringe. Once venous blood was aspirated, 035 wire was passed through the needle, observed enter the IVC under fluoroscopy. The needle was removed, dilation of the soft tissues was performed, and a triple-lumen central catheter was placed over the wire. The lumen were flushed. Cobra catheter was then advanced through the arterial sheath over the Bentson wire to the level of the superior mesenteric artery. SMA angiogram was performed. Increase magnification was used for planning angiogram and then a micro catheter system was used in an attempt to select the origin of the inferior pancreaticoduodenal arcade contributing to the abnormality in the region of the prior coil mass. The acute angle of the vessel origin proved difficult to select with the micro catheter and base  catheter combination, and os the base catheter was exchanged over the 035 wire for a Mickelson catheter. Again, difficulty achieving micro catheter placement was encountered with the Mickelson catheter as the base catheter. The Mickelson catheter was then exchanged for the Cobra catheter, which was advanced over the micro catheter system several cm into the origin of the SMA. With exaggerated curve on the micro wire, the targeted vessel was selected. The third order branch vessel again proved difficult to navigate with an acute angle to the patient's right after the initial leftward branch. Thus, with the micro wire micro catheter were advanced into a distal branch and the base catheter was advanced into the  origin of the second order branch. The third order branch contributing to the pseudoaneurysm was then selected. Micro catheter was advanced as distal as possible, with coil embolization performed with 2 separate 2 mm coil. Micro catheter was withdrawn and then the Cobra catheter was used to select the celiac artery. Celiac angiogram performed. Superior mesenteric artery angiogram was then repeated, to assure no continuous or further flow to the pseudoaneurysm. Limited angiogram at the right common femoral artery access was performed. All catheters and wires were removed. Exoseal device was then deployed for hemostasis. Patient tolerated the procedure well and remained hemodynamically stable throughout. No complications were encountered and no significant blood loss encountered. FINDINGS: Superior mesenteric artery angiogram demonstrates normal filling of the arcades an the ileal colicky artery. Inferior pancreaticoduodenal branches contribute to pseudoaneurysm near the previously placed micro coil pack of the gastric duodenal artery. There is filling of the gastroepiploic artery via these inferior branches. After coil embolization of the proximal collateral vessel there is no continuous flow to the pseudoaneurysm. Because the coil pack on today's study is proximal to the gastroepiploic branch, there is potential for reversal of flow into the region of the pseudoaneurysm at the isolated distal GDA. Future embolization may be difficult to reach this site Angiogram of the celiac artery demonstrates no filling of the gastroduodenal artery beyond the coil pack with no collateral contribution to the distal GDA from the celiac artery. Status post embolization, final angiogram demonstrates no continuous flow to the distal GDA via the embolized pancreaticoduodenal arcade. IMPRESSION: Status post ultrasound-guided right common femoral artery access with mesenteric angiogram and coil embolization of inferior  pancreaticoduodenal arcade branch contributing to pseudoaneurysm of distal GDA. Deployment of Exoseal device for hemostasis. Ultrasound-guided right common femoral vein central venous access. Signed, Dulcy Fanny. Earleen Newport, DO [facility] Radiology Electronically Signed   By: Corrie Mckusick D.O.   On: 08/31/2015 10:51   Ir Angiogram Selective Each Additional Vessel  Result Date: 08/31/2015 INDICATION: 80 year old male with known pancreatic carcinoma. 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 This was coil embolized May of 2017. He presents again with melena and upper GI bleeding. Endoscopies performed on the day of the procedure demonstrates active hemorrhage in the duodenum. Given his hemodynamic instability and the endoscopy findings, angiogram and potential embolization indicated. EXAM: SELECTIVE VISCERAL ARTERIOGRAPHY; IR ULTRASOUND GUIDANCE VASC ACCESS RIGHT; ADDITIONAL ARTERIOGRAPHY; IR RIGHT FLOURO GUIDE CV LINE; IR EMBO ART VEN HEMORR LYMPH EXTRAV INC GUIDE ROADMAPPING; ARTERIOGRAPHY Ultrasound-guided right common femoral central venous access. MEDICATIONS: None ANESTHESIA/SEDATION: A total of Versed 0 mg and Fentanyl 25 mcg was administered intravenously. No conscious sedation The patient's level of consciousness and vital signs were monitored continuously by radiology nursing throughout the procedure under my direct supervision. CONTRAST:  150 cc FLUOROSCOPY TIME:  Fluoroscopy Time: 48 minutes 0 seconds (2,190 mGy). COMPLICATIONS: None PROCEDURE: Informed consent was obtained from the patient following explanation of the procedure, risks, benefits and alternatives. The patient understands, agrees and consents for the procedure. All questions were addressed. A time out was performed prior to the initiation of the procedure. Maximal barrier sterile technique utilized including caps, mask, sterile gowns, sterile gloves, large sterile  drape, hand hygiene, and Betadine prep. Ultrasound survey of the right inguinal region was performed with images stored and sent to PACs. A micropuncture needle was used access the right common femoral artery under ultrasound. With excellent arterial blood flow returned, and an .018 micro wire was passed through the needle, observed enter the abdominal aorta under fluoroscopy. The needle was removed, and a micropuncture sheath was placed over the wire. The inner dilator and wire were removed, and an 035 Bentson wire was advanced under fluoroscopy into the abdominal aorta. The sheath was removed and a standard 5 [place] vascular sheath was placed. The dilator was removed and the sheath was flushed. Ultrasound guidance was then used to access the right common femoral vein. A single wall needle was used with the saline syringe. Once venous blood was aspirated, 035 wire was passed through the needle, observed enter the IVC under fluoroscopy. The needle was removed, dilation of the soft tissues was performed, and a triple-lumen central catheter was placed over the wire. The lumen were flushed. Cobra catheter was then advanced through the arterial sheath over the Bentson wire to the level of the superior mesenteric artery. SMA angiogram was performed. Increase magnification was used for planning angiogram and then a micro catheter system was used in an attempt to select the origin of the inferior pancreaticoduodenal arcade contributing to the abnormality in the region of the prior coil mass. The acute angle of the vessel origin proved difficult to select with the micro catheter and base catheter combination, and os the base catheter was exchanged over the 035 wire for a Mickelson catheter. Again, difficulty achieving micro catheter placement was encountered with the Mickelson catheter as the base catheter. The Mickelson catheter was then exchanged for the Cobra catheter, which was advanced over the micro catheter system  several cm into the origin of the SMA. With exaggerated curve on the micro wire, the targeted vessel was selected. The third order branch vessel again proved difficult to navigate with an acute angle to the patient's right after the initial leftward branch. Thus, with the micro wire micro catheter were advanced into a distal branch and the base catheter was advanced into the origin of the second order branch. The third order branch contributing to the pseudoaneurysm was then selected. Micro catheter was advanced as distal as possible, with coil embolization performed with 2 separate 2 mm coil. Micro catheter was withdrawn and then the Cobra catheter was used to select the celiac artery. Celiac angiogram performed. Superior mesenteric artery angiogram was then repeated, to assure no continuous or further flow to the pseudoaneurysm. Limited angiogram at the right common femoral artery access was performed. All catheters and wires were removed. Exoseal device was then deployed for hemostasis. Patient tolerated the procedure well and remained hemodynamically stable throughout. No complications were encountered and no significant blood loss encountered. FINDINGS: Superior mesenteric artery angiogram demonstrates normal filling of the arcades an the ileal colicky artery. Inferior pancreaticoduodenal branches contribute to pseudoaneurysm near the previously placed micro coil pack of the gastric duodenal artery. There is filling of the gastroepiploic artery via these  inferior branches. After coil embolization of the proximal collateral vessel there is no continuous flow to the pseudoaneurysm. Because the coil pack on today's study is proximal to the gastroepiploic branch, there is potential for reversal of flow into the region of the pseudoaneurysm at the isolated distal GDA. Future embolization may be difficult to reach this site Angiogram of the celiac artery demonstrates no filling of the gastroduodenal artery beyond the  coil pack with no collateral contribution to the distal GDA from the celiac artery. Status post embolization, final angiogram demonstrates no continuous flow to the distal GDA via the embolized pancreaticoduodenal arcade. IMPRESSION: Status post ultrasound-guided right common femoral artery access with mesenteric angiogram and coil embolization of inferior pancreaticoduodenal arcade branch contributing to pseudoaneurysm of distal GDA. Deployment of Exoseal device for hemostasis. Ultrasound-guided right common femoral vein central venous access. Signed, Dulcy Fanny. Earleen Newport, DO [facility] Radiology Electronically Signed   By: Corrie Mckusick D.O.   On: 08/31/2015 10:51   Ir Angiogram Selective Each Additional Vessel  Result Date: 08/31/2015 INDICATION: 80 year old male with known pancreatic carcinoma. 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 This was coil embolized May of 2017. He presents again with melena and upper GI bleeding. Endoscopies performed on the day of the procedure demonstrates active hemorrhage in the duodenum. Given his hemodynamic instability and the endoscopy findings, angiogram and potential embolization indicated. EXAM: SELECTIVE VISCERAL ARTERIOGRAPHY; IR ULTRASOUND GUIDANCE VASC ACCESS RIGHT; ADDITIONAL ARTERIOGRAPHY; IR RIGHT FLOURO GUIDE CV LINE; IR EMBO ART VEN HEMORR LYMPH EXTRAV INC GUIDE ROADMAPPING; ARTERIOGRAPHY Ultrasound-guided right common femoral central venous access. MEDICATIONS: None ANESTHESIA/SEDATION: A total of Versed 0 mg and Fentanyl 25 mcg was administered intravenously. No conscious sedation The patient's level of consciousness and vital signs were monitored continuously by radiology nursing throughout the procedure under my direct supervision. CONTRAST:  150 cc FLUOROSCOPY TIME:  Fluoroscopy Time: 48 minutes 0 seconds (2,190 mGy). COMPLICATIONS: None PROCEDURE: Informed consent was obtained  from the patient following explanation of the procedure, risks, benefits and alternatives. The patient understands, agrees and consents for the procedure. All questions were addressed. A time out was performed prior to the initiation of the procedure. Maximal barrier sterile technique utilized including caps, mask, sterile gowns, sterile gloves, large sterile drape, hand hygiene, and Betadine prep. Ultrasound survey of the right inguinal region was performed with images stored and sent to PACs. A micropuncture needle was used access the right common femoral artery under ultrasound. With excellent arterial blood flow returned, and an .018 micro wire was passed through the needle, observed enter the abdominal aorta under fluoroscopy. The needle was removed, and a micropuncture sheath was placed over the wire. The inner dilator and wire were removed, and an 035 Bentson wire was advanced under fluoroscopy into the abdominal aorta. The sheath was removed and a standard 5 [place] vascular sheath was placed. The dilator was removed and the sheath was flushed. Ultrasound guidance was then used to access the right common femoral vein. A single wall needle was used with the saline syringe. Once venous blood was aspirated, 035 wire was passed through the needle, observed enter the IVC under fluoroscopy. The needle was removed, dilation of the soft tissues was performed, and a triple-lumen central catheter was placed over the wire. The lumen were flushed. Cobra catheter was then advanced through the arterial sheath over the Bentson wire to the level of the superior mesenteric artery. SMA angiogram was performed. Increase magnification was  used for planning angiogram and then a micro catheter system was used in an attempt to select the origin of the inferior pancreaticoduodenal arcade contributing to the abnormality in the region of the prior coil mass. The acute angle of the vessel origin proved difficult to select with the  micro catheter and base catheter combination, and os the base catheter was exchanged over the 035 wire for a Mickelson catheter. Again, difficulty achieving micro catheter placement was encountered with the Mickelson catheter as the base catheter. The Mickelson catheter was then exchanged for the Cobra catheter, which was advanced over the micro catheter system several cm into the origin of the SMA. With exaggerated curve on the micro wire, the targeted vessel was selected. The third order branch vessel again proved difficult to navigate with an acute angle to the patient's right after the initial leftward branch. Thus, with the micro wire micro catheter were advanced into a distal branch and the base catheter was advanced into the origin of the second order branch. The third order branch contributing to the pseudoaneurysm was then selected. Micro catheter was advanced as distal as possible, with coil embolization performed with 2 separate 2 mm coil. Micro catheter was withdrawn and then the Cobra catheter was used to select the celiac artery. Celiac angiogram performed. Superior mesenteric artery angiogram was then repeated, to assure no continuous or further flow to the pseudoaneurysm. Limited angiogram at the right common femoral artery access was performed. All catheters and wires were removed. Exoseal device was then deployed for hemostasis. Patient tolerated the procedure well and remained hemodynamically stable throughout. No complications were encountered and no significant blood loss encountered. FINDINGS: Superior mesenteric artery angiogram demonstrates normal filling of the arcades an the ileal colicky artery. Inferior pancreaticoduodenal branches contribute to pseudoaneurysm near the previously placed micro coil pack of the gastric duodenal artery. There is filling of the gastroepiploic artery via these inferior branches. After coil embolization of the proximal collateral vessel there is no continuous  flow to the pseudoaneurysm. Because the coil pack on today's study is proximal to the gastroepiploic branch, there is potential for reversal of flow into the region of the pseudoaneurysm at the isolated distal GDA. Future embolization may be difficult to reach this site Angiogram of the celiac artery demonstrates no filling of the gastroduodenal artery beyond the coil pack with no collateral contribution to the distal GDA from the celiac artery. Status post embolization, final angiogram demonstrates no continuous flow to the distal GDA via the embolized pancreaticoduodenal arcade. IMPRESSION: Status post ultrasound-guided right common femoral artery access with mesenteric angiogram and coil embolization of inferior pancreaticoduodenal arcade branch contributing to pseudoaneurysm of distal GDA. Deployment of Exoseal device for hemostasis. Ultrasound-guided right common femoral vein central venous access. Signed, Dulcy Fanny. Earleen Newport, DO [facility] Radiology Electronically Signed   By: Corrie Mckusick D.O.   On: 08/31/2015 10:51   Ir Angiogram Follow Up Study  Result Date: 08/31/2015 INDICATION: 80 year old male with known pancreatic carcinoma. 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 This was coil embolized May of 2017. He presents again with melena and upper GI bleeding. Endoscopies performed on the day of the procedure demonstrates active hemorrhage in the duodenum. Given his hemodynamic instability and the endoscopy findings, angiogram and potential embolization indicated. EXAM: SELECTIVE VISCERAL ARTERIOGRAPHY; IR ULTRASOUND GUIDANCE VASC ACCESS RIGHT; ADDITIONAL ARTERIOGRAPHY; IR RIGHT FLOURO GUIDE CV LINE; IR EMBO ART VEN HEMORR LYMPH EXTRAV INC GUIDE ROADMAPPING; ARTERIOGRAPHY Ultrasound-guided  right common femoral central venous access. MEDICATIONS: None ANESTHESIA/SEDATION: A total of Versed 0 mg and Fentanyl 25 mcg was  administered intravenously. No conscious sedation The patient's level of consciousness and vital signs were monitored continuously by radiology nursing throughout the procedure under my direct supervision. CONTRAST:  150 cc FLUOROSCOPY TIME:  Fluoroscopy Time: 48 minutes 0 seconds (2,190 mGy). COMPLICATIONS: None PROCEDURE: Informed consent was obtained from the patient following explanation of the procedure, risks, benefits and alternatives. The patient understands, agrees and consents for the procedure. All questions were addressed. A time out was performed prior to the initiation of the procedure. Maximal barrier sterile technique utilized including caps, mask, sterile gowns, sterile gloves, large sterile drape, hand hygiene, and Betadine prep. Ultrasound survey of the right inguinal region was performed with images stored and sent to PACs. A micropuncture needle was used access the right common femoral artery under ultrasound. With excellent arterial blood flow returned, and an .018 micro wire was passed through the needle, observed enter the abdominal aorta under fluoroscopy. The needle was removed, and a micropuncture sheath was placed over the wire. The inner dilator and wire were removed, and an 035 Bentson wire was advanced under fluoroscopy into the abdominal aorta. The sheath was removed and a standard 5 [place] vascular sheath was placed. The dilator was removed and the sheath was flushed. Ultrasound guidance was then used to access the right common femoral vein. A single wall needle was used with the saline syringe. Once venous blood was aspirated, 035 wire was passed through the needle, observed enter the IVC under fluoroscopy. The needle was removed, dilation of the soft tissues was performed, and a triple-lumen central catheter was placed over the wire. The lumen were flushed. Cobra catheter was then advanced through the arterial sheath over the Bentson wire to the level of the superior mesenteric  artery. SMA angiogram was performed. Increase magnification was used for planning angiogram and then a micro catheter system was used in an attempt to select the origin of the inferior pancreaticoduodenal arcade contributing to the abnormality in the region of the prior coil mass. The acute angle of the vessel origin proved difficult to select with the micro catheter and base catheter combination, and os the base catheter was exchanged over the 035 wire for a Mickelson catheter. Again, difficulty achieving micro catheter placement was encountered with the Mickelson catheter as the base catheter. The Mickelson catheter was then exchanged for the Cobra catheter, which was advanced over the micro catheter system several cm into the origin of the SMA. With exaggerated curve on the micro wire, the targeted vessel was selected. The third order branch vessel again proved difficult to navigate with an acute angle to the patient's right after the initial leftward branch. Thus, with the micro wire micro catheter were advanced into a distal branch and the base catheter was advanced into the origin of the second order branch. The third order branch contributing to the pseudoaneurysm was then selected. Micro catheter was advanced as distal as possible, with coil embolization performed with 2 separate 2 mm coil. Micro catheter was withdrawn and then the Cobra catheter was used to select the celiac artery. Celiac angiogram performed. Superior mesenteric artery angiogram was then repeated, to assure no continuous or further flow to the pseudoaneurysm. Limited angiogram at the right common femoral artery access was performed. All catheters and wires were removed. Exoseal device was then deployed for hemostasis. Patient tolerated the procedure well and remained hemodynamically stable throughout.  No complications were encountered and no significant blood loss encountered. FINDINGS: Superior mesenteric artery angiogram demonstrates  normal filling of the arcades an the ileal colicky artery. Inferior pancreaticoduodenal branches contribute to pseudoaneurysm near the previously placed micro coil pack of the gastric duodenal artery. There is filling of the gastroepiploic artery via these inferior branches. After coil embolization of the proximal collateral vessel there is no continuous flow to the pseudoaneurysm. Because the coil pack on today's study is proximal to the gastroepiploic branch, there is potential for reversal of flow into the region of the pseudoaneurysm at the isolated distal GDA. Future embolization may be difficult to reach this site Angiogram of the celiac artery demonstrates no filling of the gastroduodenal artery beyond the coil pack with no collateral contribution to the distal GDA from the celiac artery. Status post embolization, final angiogram demonstrates no continuous flow to the distal GDA via the embolized pancreaticoduodenal arcade. IMPRESSION: Status post ultrasound-guided right common femoral artery access with mesenteric angiogram and coil embolization of inferior pancreaticoduodenal arcade branch contributing to pseudoaneurysm of distal GDA. Deployment of Exoseal device for hemostasis. Ultrasound-guided right common femoral vein central venous access. Signed, Dulcy Fanny. Earleen Newport, DO [facility] Radiology Electronically Signed   By: Corrie Mckusick D.O.   On: 08/31/2015 10:51   Ir Fluoro Guide Cv Line Right  Result Date: 08/31/2015 INDICATION: 80 year old male with known pancreatic carcinoma. 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 This was coil embolized May of 2017. He presents again with melena and upper GI bleeding. Endoscopies performed on the day of the procedure demonstrates active hemorrhage in the duodenum. Given his hemodynamic instability and the endoscopy findings, angiogram and potential embolization indicated. EXAM:  SELECTIVE VISCERAL ARTERIOGRAPHY; IR ULTRASOUND GUIDANCE VASC ACCESS RIGHT; ADDITIONAL ARTERIOGRAPHY; IR RIGHT FLOURO GUIDE CV LINE; IR EMBO ART VEN HEMORR LYMPH EXTRAV INC GUIDE ROADMAPPING; ARTERIOGRAPHY Ultrasound-guided right common femoral central venous access. MEDICATIONS: None ANESTHESIA/SEDATION: A total of Versed 0 mg and Fentanyl 25 mcg was administered intravenously. No conscious sedation The patient's level of consciousness and vital signs were monitored continuously by radiology nursing throughout the procedure under my direct supervision. CONTRAST:  150 cc FLUOROSCOPY TIME:  Fluoroscopy Time: 48 minutes 0 seconds (2,190 mGy). COMPLICATIONS: None PROCEDURE: Informed consent was obtained from the patient following explanation of the procedure, risks, benefits and alternatives. The patient understands, agrees and consents for the procedure. All questions were addressed. A time out was performed prior to the initiation of the procedure. Maximal barrier sterile technique utilized including caps, mask, sterile gowns, sterile gloves, large sterile drape, hand hygiene, and Betadine prep. Ultrasound survey of the right inguinal region was performed with images stored and sent to PACs. A micropuncture needle was used access the right common femoral artery under ultrasound. With excellent arterial blood flow returned, and an .018 micro wire was passed through the needle, observed enter the abdominal aorta under fluoroscopy. The needle was removed, and a micropuncture sheath was placed over the wire. The inner dilator and wire were removed, and an 035 Bentson wire was advanced under fluoroscopy into the abdominal aorta. The sheath was removed and a standard 5 [place] vascular sheath was placed. The dilator was removed and the sheath was flushed. Ultrasound guidance was then used to access the right common femoral vein. A single wall needle was used with the saline syringe. Once venous blood was aspirated, 035 wire  was passed through the needle, observed enter the IVC under  fluoroscopy. The needle was removed, dilation of the soft tissues was performed, and a triple-lumen central catheter was placed over the wire. The lumen were flushed. Cobra catheter was then advanced through the arterial sheath over the Bentson wire to the level of the superior mesenteric artery. SMA angiogram was performed. Increase magnification was used for planning angiogram and then a micro catheter system was used in an attempt to select the origin of the inferior pancreaticoduodenal arcade contributing to the abnormality in the region of the prior coil mass. The acute angle of the vessel origin proved difficult to select with the micro catheter and base catheter combination, and os the base catheter was exchanged over the 035 wire for a Mickelson catheter. Again, difficulty achieving micro catheter placement was encountered with the Mickelson catheter as the base catheter. The Mickelson catheter was then exchanged for the Cobra catheter, which was advanced over the micro catheter system several cm into the origin of the SMA. With exaggerated curve on the micro wire, the targeted vessel was selected. The third order branch vessel again proved difficult to navigate with an acute angle to the patient's right after the initial leftward branch. Thus, with the micro wire micro catheter were advanced into a distal branch and the base catheter was advanced into the origin of the second order branch. The third order branch contributing to the pseudoaneurysm was then selected. Micro catheter was advanced as distal as possible, with coil embolization performed with 2 separate 2 mm coil. Micro catheter was withdrawn and then the Cobra catheter was used to select the celiac artery. Celiac angiogram performed. Superior mesenteric artery angiogram was then repeated, to assure no continuous or further flow to the pseudoaneurysm. Limited angiogram at the right common  femoral artery access was performed. All catheters and wires were removed. Exoseal device was then deployed for hemostasis. Patient tolerated the procedure well and remained hemodynamically stable throughout. No complications were encountered and no significant blood loss encountered. FINDINGS: Superior mesenteric artery angiogram demonstrates normal filling of the arcades an the ileal colicky artery. Inferior pancreaticoduodenal branches contribute to pseudoaneurysm near the previously placed micro coil pack of the gastric duodenal artery. There is filling of the gastroepiploic artery via these inferior branches. After coil embolization of the proximal collateral vessel there is no continuous flow to the pseudoaneurysm. Because the coil pack on today's study is proximal to the gastroepiploic branch, there is potential for reversal of flow into the region of the pseudoaneurysm at the isolated distal GDA. Future embolization may be difficult to reach this site Angiogram of the celiac artery demonstrates no filling of the gastroduodenal artery beyond the coil pack with no collateral contribution to the distal GDA from the celiac artery. Status post embolization, final angiogram demonstrates no continuous flow to the distal GDA via the embolized pancreaticoduodenal arcade. IMPRESSION: Status post ultrasound-guided right common femoral artery access with mesenteric angiogram and coil embolization of inferior pancreaticoduodenal arcade branch contributing to pseudoaneurysm of distal GDA. Deployment of Exoseal device for hemostasis. Ultrasound-guided right common femoral vein central venous access. Signed, Dulcy Fanny. Earleen Newport, DO [facility] Radiology Electronically Signed   By: Corrie Mckusick D.O.   On: 08/31/2015 10:51   Ir US Guide Vasc Access Right  Result Date: 08/31/2015 INDICATION: 80 year old male with known pancreatic carcinoma. He has had previous hemorrhage into  the duodenum from GDA pseudoaneurysm. This was coil embolized May of 2017. He presents again with melena and upper GI bleeding. Endoscopies performed  on the day of the procedure demonstrates active hemorrhage in the duodenum. Given his hemodynamic instability and the endoscopy findings, angiogram and potential embolization indicated. EXAM: SELECTIVE VISCERAL ARTERIOGRAPHY; IR ULTRASOUND GUIDANCE VASC ACCESS RIGHT; ADDITIONAL ARTERIOGRAPHY; IR RIGHT FLOURO GUIDE CV LINE; IR EMBO ART VEN HEMORR LYMPH EXTRAV INC GUIDE ROADMAPPING; ARTERIOGRAPHY Ultrasound-guided right common femoral central venous access. MEDICATIONS: None ANESTHESIA/SEDATION: A total of Versed 0 mg and Fentanyl 25 mcg was administered intravenously. No conscious sedation The patient's level of consciousness and vital signs were monitored continuously by radiology nursing throughout the procedure under my direct supervision. CONTRAST:  150 cc FLUOROSCOPY TIME:  Fluoroscopy Time: 48 minutes 0 seconds (2,190 mGy). COMPLICATIONS: None PROCEDURE: Informed consent was obtained from the patient following explanation of the procedure, risks, benefits and alternatives. The patient understands, agrees and consents for the procedure. All questions were addressed. A time out was performed prior to the initiation of the procedure. Maximal barrier sterile technique utilized including caps, mask, sterile gowns, sterile gloves, large sterile drape, hand hygiene, and Betadine prep. Ultrasound survey of the right inguinal region was performed with images stored and sent to PACs. A micropuncture needle was used access the right common femoral artery under ultrasound. With excellent arterial blood flow returned, and an .018 micro wire was passed through the needle, observed enter the abdominal aorta under fluoroscopy. The needle was removed, and a micropuncture sheath was placed over the wire. The inner dilator and wire were removed, and an 035 Bentson wire was advanced  under fluoroscopy into the abdominal aorta. The sheath was removed and a standard 5 [place] vascular sheath was placed. The dilator was removed and the sheath was flushed. Ultrasound guidance was then used to access the right common femoral vein. A single wall needle was used with the saline syringe. Once venous blood was aspirated, 035 wire was passed through the needle, observed enter the IVC under fluoroscopy. The needle was removed, dilation of the soft tissues was performed, and a triple-lumen central catheter was placed over the wire. The lumen were flushed. Cobra catheter was then advanced through the arterial sheath over the Bentson wire to the level of the superior mesenteric artery. SMA angiogram was performed. Increase magnification was used for planning angiogram and then a micro catheter system was used in an attempt to select the origin of the inferior pancreaticoduodenal arcade contributing to the abnormality in the region of the prior coil mass. The acute angle of the vessel origin proved difficult to select with the micro catheter and base catheter combination, and os the base catheter was exchanged over the 035 wire for a Mickelson catheter. Again, difficulty achieving micro catheter placement was encountered with the Mickelson catheter as the base catheter. The Mickelson catheter was then exchanged for the Cobra catheter, which was advanced over the micro catheter system several cm into the origin of the SMA. With exaggerated curve on the micro wire, the targeted vessel was selected. The third order branch vessel again proved difficult to navigate with an acute angle to the patient's right after the initial leftward branch. Thus, with the micro wire micro catheter were advanced into a distal branch and the base catheter was advanced into the origin of the second order branch. The third order branch contributing to the pseudoaneurysm was then selected. Micro catheter was advanced as distal as  possible, with coil embolization performed with 2 separate 2 mm coil. Micro catheter was withdrawn and then the Cobra catheter was used to select the celiac  artery. Celiac angiogram performed. Superior mesenteric artery angiogram was then repeated, to assure no continuous or further flow to the pseudoaneurysm. Limited angiogram at the right common femoral artery access was performed. All catheters and wires were removed. Exoseal device was then deployed for hemostasis. Patient tolerated the procedure well and remained hemodynamically stable throughout. No complications were encountered and no significant blood loss encountered. FINDINGS: Superior mesenteric artery angiogram demonstrates normal filling of the arcades an the ileal colicky artery. Inferior pancreaticoduodenal branches contribute to pseudoaneurysm near the previously placed micro coil pack of the gastric duodenal artery. There is filling of the gastroepiploic artery via these inferior branches. After coil embolization of the proximal collateral vessel there is no continuous flow to the pseudoaneurysm. Because the coil pack on today's study is proximal to the gastroepiploic branch, there is potential for reversal of flow into the region of the pseudoaneurysm at the isolated distal GDA. Future embolization may be difficult to reach this site Angiogram of the celiac artery demonstrates no filling of the gastroduodenal artery beyond the coil pack with no collateral contribution to the distal GDA from the celiac artery. Status post embolization, final angiogram demonstrates no continuous flow to the distal GDA via the embolized pancreaticoduodenal arcade. IMPRESSION: Status post ultrasound-guided right common femoral artery access with mesenteric angiogram and coil embolization of inferior pancreaticoduodenal arcade branch contributing to pseudoaneurysm of distal GDA. Deployment of Exoseal device for hemostasis. Ultrasound-guided right common femoral vein  central venous access. Signed, Dulcy Fanny. Earleen Newport, DO [facility] Radiology Electronically Signed   By: Corrie Mckusick D.O.   On: 08/31/2015 10:51   Ir US Guide Vasc Access Right  Result Date: 08/31/2015 INDICATION: 80 year old male with known pancreatic carcinoma. 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 This was coil embolized May of 2017. He presents again with melena and upper GI bleeding. Endoscopies performed on the day of the procedure demonstrates active hemorrhage in the duodenum. Given his hemodynamic instability and the endoscopy findings, angiogram and potential embolization indicated. EXAM: SELECTIVE VISCERAL ARTERIOGRAPHY; IR ULTRASOUND GUIDANCE VASC ACCESS RIGHT; ADDITIONAL ARTERIOGRAPHY; IR RIGHT FLOURO GUIDE CV LINE; IR EMBO ART VEN HEMORR LYMPH EXTRAV INC GUIDE ROADMAPPING; ARTERIOGRAPHY Ultrasound-guided right common femoral central venous access. MEDICATIONS: None ANESTHESIA/SEDATION: A total of Versed 0 mg and Fentanyl 25 mcg was administered intravenously. No conscious sedation The patient's level of consciousness and vital signs were monitored continuously by radiology nursing throughout the procedure under my direct supervision. CONTRAST:  150 cc FLUOROSCOPY TIME:  Fluoroscopy Time: 48 minutes 0 seconds (2,190 mGy). COMPLICATIONS: None PROCEDURE: Informed consent was obtained from the patient following explanation of the procedure, risks, benefits and alternatives. The patient understands, agrees and consents for the procedure. All questions were addressed. A time out was performed prior to the initiation of the procedure. Maximal barrier sterile technique utilized including caps, mask, sterile gowns, sterile gloves, large sterile drape, hand hygiene, and Betadine prep. Ultrasound survey of the right inguinal region was performed with images stored and sent to PACs. A micropuncture needle was used access the  right common femoral artery under ultrasound. With excellent arterial blood flow returned, and an .018 micro wire was passed through the needle, observed enter the abdominal aorta under fluoroscopy. The needle was removed, and a micropuncture sheath was placed over the wire. The inner dilator and wire were removed, and an 035 Bentson wire was advanced under fluoroscopy into the abdominal aorta. The sheath was removed and a standard  5 [place] vascular sheath was placed. The dilator was removed and the sheath was flushed. Ultrasound guidance was then used to access the right common femoral vein. A single wall needle was used with the saline syringe. Once venous blood was aspirated, 035 wire was passed through the needle, observed enter the IVC under fluoroscopy. The needle was removed, dilation of the soft tissues was performed, and a triple-lumen central catheter was placed over the wire. The lumen were flushed. Cobra catheter was then advanced through the arterial sheath over the Bentson wire to the level of the superior mesenteric artery. SMA angiogram was performed. Increase magnification was used for planning angiogram and then a micro catheter system was used in an attempt to select the origin of the inferior pancreaticoduodenal arcade contributing to the abnormality in the region of the prior coil mass. The acute angle of the vessel origin proved difficult to select with the micro catheter and base catheter combination, and os the base catheter was exchanged over the 035 wire for a Mickelson catheter. Again, difficulty achieving micro catheter placement was encountered with the Mickelson catheter as the base catheter. The Mickelson catheter was then exchanged for the Cobra catheter, which was advanced over the micro catheter system several cm into the origin of the SMA. With exaggerated curve on the micro wire, the targeted vessel was selected. The third order branch vessel again proved difficult to navigate  with an acute angle to the patient's right after the initial leftward branch. Thus, with the micro wire micro catheter were advanced into a distal branch and the base catheter was advanced into the origin of the second order branch. The third order branch contributing to the pseudoaneurysm was then selected. Micro catheter was advanced as distal as possible, with coil embolization performed with 2 separate 2 mm coil. Micro catheter was withdrawn and then the Cobra catheter was used to select the celiac artery. Celiac angiogram performed. Superior mesenteric artery angiogram was then repeated, to assure no continuous or further flow to the pseudoaneurysm. Limited angiogram at the right common femoral artery access was performed. All catheters and wires were removed. Exoseal device was then deployed for hemostasis. Patient tolerated the procedure well and remained hemodynamically stable throughout. No complications were encountered and no significant blood loss encountered. FINDINGS: Superior mesenteric artery angiogram demonstrates normal filling of the arcades an the ileal colicky artery. Inferior pancreaticoduodenal branches contribute to pseudoaneurysm near the previously placed micro coil pack of the gastric duodenal artery. There is filling of the gastroepiploic artery via these inferior branches. After coil embolization of the proximal collateral vessel there is no continuous flow to the pseudoaneurysm. Because the coil pack on today's study is proximal to the gastroepiploic branch, there is potential for reversal of flow into the region of the pseudoaneurysm at the isolated distal GDA. Future embolization may be difficult to reach this site Angiogram of the celiac artery demonstrates no filling of the gastroduodenal artery beyond the coil pack with no collateral contribution to the distal GDA from the celiac artery. Status post embolization, final angiogram demonstrates no continuous flow to the distal GDA via  the embolized pancreaticoduodenal arcade. IMPRESSION: Status post ultrasound-guided right common femoral artery access with mesenteric angiogram and coil embolization of inferior pancreaticoduodenal arcade branch contributing to pseudoaneurysm of distal GDA. Deployment of Exoseal device for hemostasis. Ultrasound-guided right common femoral vein central venous access. Signed, Dulcy Fanny. Earleen Newport, DO [facility] Radiology Electronically Signed   By: Corrie Mckusick  D.O.   On: 08/31/2015 10:51   Dg Chest Portable 1 View  Result Date: 08/29/2015 CLINICAL DATA:  GI bleed. Patient has pancreatic carcinoma. Some shortness of breath. EXAM: PORTABLE CHEST 1 VIEW COMPARISON:  06/01/2015 FINDINGS: Cardiac silhouette is normal in size. No mediastinal or hilar masses or convincing adenopathy. Left anterior chest wall dual lumen Port-A-Cath is stable with its catheter tip near the caval atrial junction. There are prominent bronchovascular markings. Minor scarring or atelectasis noted at the right lateral lung base. Lungs otherwise clear. No pleural effusion or pneumothorax. Bony thorax is demineralized but grossly intact. IMPRESSION: No acute cardiopulmonary disease. Electronically Signed   By: Lajean Manes M.D.   On: 08/29/2015 16:08   Ir Embo Otilio Saber Hemorr Lymph [place] Guide Roadmapping  Result Date: 08/31/2015 INDICATION: 80 year old male with known pancreatic carcinoma. 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 This was coil embolized May of 2017. He presents again with melena and upper GI bleeding. Endoscopies performed on the day of the procedure demonstrates active hemorrhage in the duodenum. Given his hemodynamic instability and the endoscopy findings, angiogram and potential embolization indicated. EXAM: SELECTIVE VISCERAL ARTERIOGRAPHY; IR ULTRASOUND GUIDANCE VASC ACCESS RIGHT; ADDITIONAL ARTERIOGRAPHY; IR RIGHT FLOURO GUIDE CV  LINE; IR EMBO ART VEN HEMORR LYMPH EXTRAV INC GUIDE ROADMAPPING; ARTERIOGRAPHY Ultrasound-guided right common femoral central venous access. MEDICATIONS: None ANESTHESIA/SEDATION: A total of Versed 0 mg and Fentanyl 25 mcg was administered intravenously. No conscious sedation The patient's level of consciousness and vital signs were monitored continuously by radiology nursing throughout the procedure under my direct supervision. CONTRAST:  150 cc FLUOROSCOPY TIME:  Fluoroscopy Time: 48 minutes 0 seconds (2,190 mGy). COMPLICATIONS: None PROCEDURE: Informed consent was obtained from the patient following explanation of the procedure, risks, benefits and alternatives. The patient understands, agrees and consents for the procedure. All questions were addressed. A time out was performed prior to the initiation of the procedure. Maximal barrier sterile technique utilized including caps, mask, sterile gowns, sterile gloves, large sterile drape, hand hygiene, and Betadine prep. Ultrasound survey of the right inguinal region was performed with images stored and sent to PACs. A micropuncture needle was used access the right common femoral artery under ultrasound. With excellent arterial blood flow returned, and an .018 micro wire was passed through the needle, observed enter the abdominal aorta under fluoroscopy. The needle was removed, and a micropuncture sheath was placed over the wire. The inner dilator and wire were removed, and an 035 Bentson wire was advanced under fluoroscopy into the abdominal aorta. The sheath was removed and a standard 5 [place] vascular sheath was placed. The dilator was removed and the sheath was flushed. Ultrasound guidance was then used to access the right common femoral vein. A single wall needle was used with the saline syringe. Once venous blood was aspirated, 035 wire was passed through the needle, observed enter the IVC under fluoroscopy. The needle was removed, dilation of the soft tissues  was performed, and a triple-lumen central catheter was placed over the wire. The lumen were flushed. Cobra catheter was then advanced through the arterial sheath over the Bentson wire to the level of the superior mesenteric artery. SMA angiogram was performed. Increase magnification was used for planning angiogram and then a micro catheter system was used in an attempt to select the origin of the inferior pancreaticoduodenal arcade contributing to the abnormality in the region of the prior coil mass. The acute angle of the vessel origin  proved difficult to select with the micro catheter and base catheter combination, and os the base catheter was exchanged over the 035 wire for a Mickelson catheter. Again, difficulty achieving micro catheter placement was encountered with the Mickelson catheter as the base catheter. The Mickelson catheter was then exchanged for the Cobra catheter, which was advanced over the micro catheter system several cm into the origin of the SMA. With exaggerated curve on the micro wire, the targeted vessel was selected. The third order branch vessel again proved difficult to navigate with an acute angle to the patient's right after the initial leftward branch. Thus, with the micro wire micro catheter were advanced into a distal branch and the base catheter was advanced into the origin of the second order branch. The third order branch contributing to the pseudoaneurysm was then selected. Micro catheter was advanced as distal as possible, with coil embolization performed with 2 separate 2 mm coil. Micro catheter was withdrawn and then the Cobra catheter was used to select the celiac artery. Celiac angiogram performed. Superior mesenteric artery angiogram was then repeated, to assure no continuous or further flow to the pseudoaneurysm. Limited angiogram at the right common femoral artery access was performed. All catheters and wires were removed. Exoseal device was then deployed for hemostasis.  Patient tolerated the procedure well and remained hemodynamically stable throughout. No complications were encountered and no significant blood loss encountered. FINDINGS: Superior mesenteric artery angiogram demonstrates normal filling of the arcades an the ileal colicky artery. Inferior pancreaticoduodenal branches contribute to pseudoaneurysm near the previously placed micro coil pack of the gastric duodenal artery. There is filling of the gastroepiploic artery via these inferior branches. After coil embolization of the proximal collateral vessel there is no continuous flow to the pseudoaneurysm. Because the coil pack on today's study is proximal to the gastroepiploic branch, there is potential for reversal of flow into the region of the pseudoaneurysm at the isolated distal GDA. Future embolization may be difficult to reach this site Angiogram of the celiac artery demonstrates no filling of the gastroduodenal artery beyond the coil pack with no collateral contribution to the distal GDA from the celiac artery. Status post embolization, final angiogram demonstrates no continuous flow to the distal GDA via the embolized pancreaticoduodenal arcade. IMPRESSION: Status post ultrasound-guided right common femoral artery access with mesenteric angiogram and coil embolization of inferior pancreaticoduodenal arcade branch contributing to pseudoaneurysm of distal GDA. Deployment of Exoseal device for hemostasis. Ultrasound-guided right common femoral vein central venous access. Signed, Dulcy Fanny. Earleen Newport, DO [facility] Radiology Electronically Signed   By: Corrie Mckusick D.O.   On: 08/31/2015 10:51    Microbiology: Recent Results (from the past 240 hour(s))  Surgical pcr screen     Status: None   Collection Time: 08/30/15  5:06 PM  Result Value Ref Range Status   MRSA, PCR NEGATIVE NEGATIVE Final   Staphylococcus aureus NEGATIVE NEGATIVE Final    Comment:        The  Xpert SA Assay (FDA approved for NASAL specimens in patients over 72 years of age), is one component of a comprehensive surveillance program.  Test performance has been validated by [facility] for patients greater than or equal to 75 year old. It is not intended to diagnose infection nor to guide or monitor treatment.      Labs: Basic Metabolic Panel:  Recent Labs Lab 08/30/15 0137 08/30/15 2200 08/31/15 0400 09/01/15 0259 09/02/15 0745  NA 140 140 145 139  138  K 4.3 4.2 4.4 4.3 4.0  CL 114* 117* 118* 113* 112*  CO2 21* 19* 20* 21* 20*  GLUCOSE 102* 111* 102* 120* 97  BUN 18 17 16 13 9   CREATININE 1.98* 1.93* 1.55* 1.70* 1.64*  CALCIUM 8.2* 7.7* 8.0* 7.9* 7.9*   Liver Function Tests:  Recent Labs Lab 08/29/15 1650  AST 51*  ALT 32  ALKPHOS 103  BILITOT 0.4  PROT 6.3*  ALBUMIN 2.4*    Recent Labs Lab 08/29/15 1650  LIPASE 25   No results for input(s): AMMONIA in the last 168 hours. CBC:  Recent Labs Lab 08/29/15 1650  08/31/15 0400 08/31/15 1442 09/01/15 0259 09/01/15 1633 09/02/15 0745  WBC 13.6*  < > 8.5 9.9 10.7* 10.3 8.8  NEUTROABS 11.6*  --   --   --   --   --   --   HGB 7.7*  < > 8.0* 8.6* 8.3* 8.3* 8.0*  HCT 23.1*  < > 24.6* 26.2* 25.3* 25.8* 24.7*  MCV 92.4  < > 90.4 91.0 91.0 91.8 91.5  PLT 516*  < > 332 347 353 327 297  < > = values in this interval not displayed. Cardiac Enzymes:  Recent Labs Lab 08/30/15 2200  TROPONINI 0.04*   BNP: BNP (last 3 results) No results for input(s): BNP in the last 8760 hours.  ProBNP (last 3 results) No results for input(s): PROBNP in the last 8760 hours.  CBG: No results for input(s): GLUCAP in the last 168 hours.     SignedFlorencia Reasons MD, PhD  [facility] 09/02/2015, 1:31 PM

## 2015-09-03 LAB — TYPE AND SCREEN
ABO/RH(D): O POS
Antibody Screen: NEGATIVE
Unit division: 0
Unit division: 0
Unit division: 0
Unit division: 0

## 2015-09-04 DIAGNOSIS — I10 Essential (primary) hypertension: Secondary | ICD-10-CM | POA: Diagnosis not present

## 2015-09-04 DIAGNOSIS — C259 Malignant neoplasm of pancreas, unspecified: Secondary | ICD-10-CM | POA: Diagnosis not present

## 2015-09-04 DIAGNOSIS — M109 Gout, unspecified: Secondary | ICD-10-CM | POA: Diagnosis not present

## 2015-09-04 DIAGNOSIS — R52 Pain, unspecified: Secondary | ICD-10-CM | POA: Diagnosis not present

## 2015-09-04 DIAGNOSIS — K219 Gastro-esophageal reflux disease without esophagitis: Secondary | ICD-10-CM | POA: Diagnosis not present

## 2015-09-06 DIAGNOSIS — I5189 Other ill-defined heart diseases: Secondary | ICD-10-CM | POA: Diagnosis not present

## 2015-09-06 DIAGNOSIS — I340 Nonrheumatic mitral (valve) insufficiency: Secondary | ICD-10-CM | POA: Diagnosis not present

## 2015-09-07 DIAGNOSIS — M109 Gout, unspecified: Secondary | ICD-10-CM | POA: Diagnosis not present

## 2015-09-07 DIAGNOSIS — K219 Gastro-esophageal reflux disease without esophagitis: Secondary | ICD-10-CM | POA: Diagnosis not present

## 2015-09-07 DIAGNOSIS — H04123 Dry eye syndrome of bilateral lacrimal glands: Secondary | ICD-10-CM | POA: Diagnosis not present

## 2015-09-07 DIAGNOSIS — C259 Malignant neoplasm of pancreas, unspecified: Secondary | ICD-10-CM | POA: Diagnosis not present

## 2015-09-07 DIAGNOSIS — R52 Pain, unspecified: Secondary | ICD-10-CM | POA: Diagnosis not present

## 2015-09-07 DIAGNOSIS — I10 Essential (primary) hypertension: Secondary | ICD-10-CM | POA: Diagnosis not present

## 2015-09-10 ENCOUNTER — Encounter (HOSPITAL_COMMUNITY): Payer: Self-pay | Admitting: Hematology & Oncology

## 2015-09-11 DIAGNOSIS — I10 Essential (primary) hypertension: Secondary | ICD-10-CM | POA: Diagnosis not present

## 2015-09-11 DIAGNOSIS — R0789 Other chest pain: Secondary | ICD-10-CM | POA: Diagnosis not present

## 2015-09-11 DIAGNOSIS — I9589 Other hypotension: Secondary | ICD-10-CM | POA: Diagnosis not present

## 2015-09-12 DIAGNOSIS — M109 Gout, unspecified: Secondary | ICD-10-CM | POA: Diagnosis not present

## 2015-09-12 DIAGNOSIS — K219 Gastro-esophageal reflux disease without esophagitis: Secondary | ICD-10-CM | POA: Diagnosis not present

## 2015-09-12 DIAGNOSIS — C259 Malignant neoplasm of pancreas, unspecified: Secondary | ICD-10-CM | POA: Diagnosis not present

## 2015-09-12 DIAGNOSIS — I10 Essential (primary) hypertension: Secondary | ICD-10-CM | POA: Diagnosis not present

## 2015-09-12 DIAGNOSIS — R52 Pain, unspecified: Secondary | ICD-10-CM | POA: Diagnosis not present

## 2015-09-14 DIAGNOSIS — C259 Malignant neoplasm of pancreas, unspecified: Secondary | ICD-10-CM | POA: Diagnosis not present

## 2015-09-14 DIAGNOSIS — K219 Gastro-esophageal reflux disease without esophagitis: Secondary | ICD-10-CM | POA: Diagnosis not present

## 2015-09-14 DIAGNOSIS — M109 Gout, unspecified: Secondary | ICD-10-CM | POA: Diagnosis not present

## 2015-09-14 DIAGNOSIS — R52 Pain, unspecified: Secondary | ICD-10-CM | POA: Diagnosis not present

## 2015-09-14 DIAGNOSIS — I10 Essential (primary) hypertension: Secondary | ICD-10-CM | POA: Diagnosis not present

## 2015-09-15 DIAGNOSIS — C259 Malignant neoplasm of pancreas, unspecified: Secondary | ICD-10-CM | POA: Diagnosis not present

## 2015-09-15 DIAGNOSIS — R52 Pain, unspecified: Secondary | ICD-10-CM | POA: Diagnosis not present

## 2015-09-15 DIAGNOSIS — I10 Essential (primary) hypertension: Secondary | ICD-10-CM | POA: Diagnosis not present

## 2015-09-15 DIAGNOSIS — K219 Gastro-esophageal reflux disease without esophagitis: Secondary | ICD-10-CM | POA: Diagnosis not present

## 2015-09-15 DIAGNOSIS — M109 Gout, unspecified: Secondary | ICD-10-CM | POA: Diagnosis not present

## 2015-09-18 DIAGNOSIS — C259 Malignant neoplasm of pancreas, unspecified: Secondary | ICD-10-CM | POA: Diagnosis not present

## 2015-09-18 DIAGNOSIS — K219 Gastro-esophageal reflux disease without esophagitis: Secondary | ICD-10-CM | POA: Diagnosis not present

## 2015-09-18 DIAGNOSIS — I10 Essential (primary) hypertension: Secondary | ICD-10-CM | POA: Diagnosis not present

## 2015-09-18 DIAGNOSIS — R52 Pain, unspecified: Secondary | ICD-10-CM | POA: Diagnosis not present

## 2015-09-18 DIAGNOSIS — M109 Gout, unspecified: Secondary | ICD-10-CM | POA: Diagnosis not present

## 2015-09-21 DIAGNOSIS — M109 Gout, unspecified: Secondary | ICD-10-CM | POA: Diagnosis not present

## 2015-09-21 DIAGNOSIS — R52 Pain, unspecified: Secondary | ICD-10-CM | POA: Diagnosis not present

## 2015-09-21 DIAGNOSIS — C259 Malignant neoplasm of pancreas, unspecified: Secondary | ICD-10-CM | POA: Diagnosis not present

## 2015-09-21 DIAGNOSIS — K219 Gastro-esophageal reflux disease without esophagitis: Secondary | ICD-10-CM | POA: Diagnosis not present

## 2015-09-21 DIAGNOSIS — I10 Essential (primary) hypertension: Secondary | ICD-10-CM | POA: Diagnosis not present

## 2015-09-22 DIAGNOSIS — I10 Essential (primary) hypertension: Secondary | ICD-10-CM | POA: Diagnosis not present

## 2015-09-22 DIAGNOSIS — C259 Malignant neoplasm of pancreas, unspecified: Secondary | ICD-10-CM | POA: Diagnosis not present

## 2015-09-22 DIAGNOSIS — R52 Pain, unspecified: Secondary | ICD-10-CM | POA: Diagnosis not present

## 2015-09-22 DIAGNOSIS — M109 Gout, unspecified: Secondary | ICD-10-CM | POA: Diagnosis not present

## 2015-09-22 DIAGNOSIS — K219 Gastro-esophageal reflux disease without esophagitis: Secondary | ICD-10-CM | POA: Diagnosis not present

## 2015-09-27 DIAGNOSIS — I10 Essential (primary) hypertension: Secondary | ICD-10-CM | POA: Diagnosis not present

## 2015-09-27 DIAGNOSIS — R52 Pain, unspecified: Secondary | ICD-10-CM | POA: Diagnosis not present

## 2015-09-27 DIAGNOSIS — K219 Gastro-esophageal reflux disease without esophagitis: Secondary | ICD-10-CM | POA: Diagnosis not present

## 2015-09-27 DIAGNOSIS — M109 Gout, unspecified: Secondary | ICD-10-CM | POA: Diagnosis not present

## 2015-09-27 DIAGNOSIS — C259 Malignant neoplasm of pancreas, unspecified: Secondary | ICD-10-CM | POA: Diagnosis not present

## 2015-09-28 DIAGNOSIS — K219 Gastro-esophageal reflux disease without esophagitis: Secondary | ICD-10-CM | POA: Diagnosis not present

## 2015-09-28 DIAGNOSIS — C259 Malignant neoplasm of pancreas, unspecified: Secondary | ICD-10-CM | POA: Diagnosis not present

## 2015-09-28 DIAGNOSIS — I480 Paroxysmal atrial fibrillation: Secondary | ICD-10-CM | POA: Diagnosis not present

## 2015-09-28 DIAGNOSIS — M109 Gout, unspecified: Secondary | ICD-10-CM | POA: Diagnosis not present

## 2015-09-28 DIAGNOSIS — R000 Tachycardia, unspecified: Secondary | ICD-10-CM | POA: Diagnosis not present

## 2015-09-28 DIAGNOSIS — R52 Pain, unspecified: Secondary | ICD-10-CM | POA: Diagnosis not present

## 2015-09-28 DIAGNOSIS — I10 Essential (primary) hypertension: Secondary | ICD-10-CM | POA: Diagnosis not present

## 2015-09-30 DIAGNOSIS — I471 Supraventricular tachycardia: Secondary | ICD-10-CM | POA: Diagnosis not present

## 2015-09-30 DIAGNOSIS — I10 Essential (primary) hypertension: Secondary | ICD-10-CM | POA: Diagnosis not present

## 2015-09-30 DIAGNOSIS — R0602 Shortness of breath: Secondary | ICD-10-CM | POA: Diagnosis not present

## 2015-09-30 DIAGNOSIS — Z886 Allergy status to analgesic agent status: Secondary | ICD-10-CM | POA: Diagnosis not present

## 2015-09-30 DIAGNOSIS — Z87891 Personal history of nicotine dependence: Secondary | ICD-10-CM | POA: Diagnosis not present

## 2015-09-30 DIAGNOSIS — I4892 Unspecified atrial flutter: Secondary | ICD-10-CM | POA: Diagnosis not present

## 2015-09-30 DIAGNOSIS — Z881 Allergy status to other antibiotic agents status: Secondary | ICD-10-CM | POA: Diagnosis not present

## 2015-10-02 DIAGNOSIS — M109 Gout, unspecified: Secondary | ICD-10-CM | POA: Diagnosis not present

## 2015-10-02 DIAGNOSIS — C259 Malignant neoplasm of pancreas, unspecified: Secondary | ICD-10-CM | POA: Diagnosis not present

## 2015-10-02 DIAGNOSIS — R52 Pain, unspecified: Secondary | ICD-10-CM | POA: Diagnosis not present

## 2015-10-02 DIAGNOSIS — K219 Gastro-esophageal reflux disease without esophagitis: Secondary | ICD-10-CM | POA: Diagnosis not present

## 2015-10-02 DIAGNOSIS — I10 Essential (primary) hypertension: Secondary | ICD-10-CM | POA: Diagnosis not present

## 2015-10-04 ENCOUNTER — Encounter (HOSPITAL_COMMUNITY): Payer: Medicare Other | Attending: Hematology & Oncology | Admitting: Oncology

## 2015-10-04 ENCOUNTER — Encounter (HOSPITAL_COMMUNITY): Payer: Self-pay | Admitting: Oncology

## 2015-10-04 VITALS — BP 81/50 | HR 83 | Resp 16 | Wt 154.1 lb

## 2015-10-04 DIAGNOSIS — C7800 Secondary malignant neoplasm of unspecified lung: Secondary | ICD-10-CM | POA: Diagnosis not present

## 2015-10-04 DIAGNOSIS — C259 Malignant neoplasm of pancreas, unspecified: Secondary | ICD-10-CM | POA: Insufficient documentation

## 2015-10-04 DIAGNOSIS — R109 Unspecified abdominal pain: Secondary | ICD-10-CM | POA: Diagnosis not present

## 2015-10-04 NOTE — Progress Notes (Signed)
William Kilts, MD [place] [id number]  Adenocarcinoma of pancreas [facility]) - Plan: CARTIA XT 120 MG 24 hr capsule, metoprolol succinate (TOPROL-XL) 50 MG 24 hr tablet, DNR (Do Not Resuscitate)  CURRENT THERAPY: Hospice  INTERVAL HISTORY: William Rivera 80 y.o. male returns for followup of Stage IV Pancreatic cancer, treated at [facility].  Now on Hospice.  He is enrolled with Hospice. His weight is down.  His BP is below baseline and he is working with cardiology regarding his anti-hypertensive regimen.  He has an appt tomorrow.    He notes fatigue and tiredness.  I suspect this is multifactorial (cancer and hypotension).  He denies any LOC or dizziness.   He reports a visit to ED in [place], [place].  He required defibrillation.    His pain is currently well controlled and he denies any constipation.  Review of Systems  Constitutional: Positive for malaise/fatigue and weight loss. Negative for chills and fever.  HENT: Negative.   Eyes: Negative.   Respiratory: Negative.   Cardiovascular: Negative.  Negative for chest pain.  Gastrointestinal: Negative for abdominal pain, constipation, nausea and vomiting.  Genitourinary: Negative.   Musculoskeletal: Negative.   Skin: Negative.   Neurological: Positive for weakness. Negative for dizziness and loss of consciousness.  Endo/Heme/Allergies: Negative.   Psychiatric/Behavioral: Negative.     Past Medical History:  Diagnosis Date  . Adenomatous colon polyp 2010  . Barrett's esophagus    last EGD/Bx 12/11  . Cancer of pancreas, other site AUG 2016   CA 19-9 958.6  . GASTRITIS 09/23/2008   Qualifier: Diagnosis of  By: Nicole Kindred LPN, Doris    . GERD (gastroesophageal reflux disease)   . Gout   . Helicobacter pylori gastritis 2000   s/p treatment  . Hypertension   . Pancreatic cancer ([place])   . Pancreatic cancer ([place])   . Paroxysmal SVT (supraventricular tachycardia) ([place]) 08/30/2015    Past Surgical  History:  Procedure Laterality Date  . BILIARY STENT PLACEMENT N/A 09/14/2014   Procedure: BILIARY STENT PLACEMENT;  Surgeon: Rogene Houston, MD;  Location: [facility];  Service: Endoscopy;  Laterality: N/A;  . BLADDER SURGERY  [id number]  . COLONOSCOPY  2010   simple adenomas  . COLONOSCOPY N/A 07/28/2013   Dr. Oneida Alar: tubular adenoma, mild sigmoid colon diverticulosis, internal hemorhhoids   . DOPPLER ECHOCARDIOGRAPHY  2009  . ERCP N/A 09/14/2014   Dr. Laural Golden: sphincterotomy with 27 F plastic biliary stent placement   . ERCP N/A 01/04/2015   Procedure: ATTEMPTED ENDOSCOPIC RETROGRADE CHOLANGIOPANCREATOGRAPHY (ERCP);  Surgeon: Daneil Dolin, MD;  Location: [facility];  Service: Endoscopy;  Laterality: N/A;  . ERCP  02/2015   [facility]: biliary stent exchange from plastic to metal stent  . ESOPHAGOGASTRODUODENOSCOPY  2008   DUODENITIS & GASTRITIS 2o TO NSAIDS/ETOH  . ESOPHAGOGASTRODUODENOSCOPY  12/2009   short segment Barrett's, small hh, chronic gastritis  . ESOPHAGOGASTRODUODENOSCOPY  01/04/2011   [id number] gastritis/Barrett's, possible  . ESOPHAGOGASTRODUODENOSCOPY N/A 11/30/2012   Dr. Oneida Alar- normal esophagus, empiric dilation d/t c/o dysphagia/?cervical web, stomach= mild non erosive gastritis, inflammation on bx, duodenum= no abnormalities in the bulb and second portion of the duodenum. dilation at the gastroesphageal junction.  . ESOPHAGOGASTRODUODENOSCOPY N/A 06/02/2015   SLF: 1. normal esophagus 2. gastrtitis mild 3. GI bleed due to large duodenal ulcer with large visible vessel, high risk to rebleed without prevention   . ESOPHAGOGASTRODUODENOSCOPY N/A 08/30/2015   Procedure: ESOPHAGOGASTRODUODENOSCOPY (EGD);  Surgeon:  Gatha Mayer, MD;  Location: [facility];  Service: Endoscopy;  Laterality: N/A;  . ESOPHAGOGASTRODUODENOSCOPY (EGD) WITH PROPOFOL N/A 01/04/2015   RMR: partial uoddenal obstruction at junction of 1st and 2nd portion of duodenum, unale to intubate 2nd portion of duodenum to  perform ERCP  . HEMORRHOID SURGERY    . IR GENERIC HISTORICAL  08/30/2015   IR US GUIDE VASC ACCESS RIGHT 08/30/2015 Corrie Mckusick, DO [facility]  . IR GENERIC HISTORICAL  08/30/2015   IR ANGIOGRAM SELECTIVE EACH ADDITIONAL VESSEL 08/30/2015 Corrie Mckusick, DO [facility]  . IR GENERIC HISTORICAL  08/30/2015   IR ANGIOGRAM VISCERAL SELECTIVE 08/30/2015 Corrie Mckusick, DO [facility]  . IR GENERIC HISTORICAL  08/30/2015   IR ANGIOGRAM FOLLOW UP STUDY 08/30/2015 Corrie Mckusick, DO [facility]  . IR GENERIC HISTORICAL  08/30/2015   IR FLUORO GUIDE CV LINE RIGHT 08/30/2015 Corrie Mckusick, DO [facility]  . IR GENERIC HISTORICAL  08/30/2015   IR EMBO ART  VEN HEMORR LYMPH EXTRAV  INC GUIDE ROADMAPPING 08/30/2015 Corrie Mckusick, DO [facility]  . IR GENERIC HISTORICAL  08/30/2015   IR ANGIOGRAM SELECTIVE EACH ADDITIONAL VESSEL 08/30/2015 Corrie Mckusick, DO [facility]  . IR GENERIC HISTORICAL  08/30/2015   IR ANGIOGRAM VISCERAL SELECTIVE 08/30/2015 Corrie Mckusick, DO [facility]  . IR GENERIC HISTORICAL  08/30/2015   IR US GUIDE VASC ACCESS RIGHT 08/30/2015 Corrie Mckusick, DO [facility]  . MALONEY DILATION N/A 11/30/2012   Procedure: MALONEY DILATION;  Surgeon: Danie Binder, MD;  [facility];  Service: Endoscopy;  Laterality: N/A;  . NASAL SEPTUM SURGERY    . NECK SURGERY     DISC REPLACED  . NM MYOVIEW LTD  2009  . REPLACEMENT TOTAL KNEE Right 11/2010  . SAVORY DILATION N/A 11/30/2012   Procedure: SAVORY DILATION;  Surgeon: Danie Binder, MD;  [facility];  Service: Endoscopy;  Laterality: N/A;  . SINUS EXPLORATION  2000  . SPHINCTEROTOMY N/A 09/14/2014   Procedure: SPHINCTEROTOMY;  Surgeon: Rogene Houston, MD;  Location: [facility];  Service: Endoscopy;  Laterality: N/A;  . stents in bile ducts    . TONSILLECTOMY     AS A CHILD    Family History  Problem Relation Age of Onset  . Stomach cancer Father 63    deceased  . Colon cancer Neg Hx     Social History   Social History  . Marital  status: Married    Spouse name: N/A  . Number of children: 1  . Years of education: N/A   Occupational History  . truck driver Retired   Social History Main Topics  . Smoking status: Never Smoker  . Smokeless tobacco: Never Used     Comment: Never smoked much  . Alcohol use No     Comment: former drinks about a pint of borboun per week  . Drug use: No  . Sexual activity: Yes    Partners: Female    Birth control/ protection: None   Other Topics Concern  . None   Social History Narrative  . None     PHYSICAL EXAMINATION  ECOG PERFORMANCE STATUS: 1 - Symptomatic but completely ambulatory  Vitals:   10/04/15 1407  BP: (!) 81/50  Pulse: 83  Resp: 16    GENERAL:alert, no distress, comfortable, cooperative, smiling and accompanied by wife. SKIN: skin color, texture, turgor are normal, no rashes or significant lesions HEAD: Normocephalic, No masses, lesions, tenderness or abnormalities EYES: normal,  Conjunctiva are pink and non-injected EARS: External ears normal OROPHARYNX:lips, buccal mucosa, and tongue normal and mucous membranes are moist  NECK: supple, trachea midline LYMPH:  no palpable lymphadenopathy BREAST:not examined LUNGS: clear to auscultation  HEART: regular rate & rhythm ABDOMEN:abdomen soft and normal bowel sounds BACK: Back symmetric, no curvature. EXTREMITIES:less then 2 second capillary refill, no joint deformities, effusion, or inflammation, no skin discoloration, no cyanosis  NEURO: alert & oriented x 3 with fluent speech, no focal motor/sensory deficits   LABORATORY DATA: CBC    Component Value Date/Time   WBC 8.8 09/02/2015 0745   RBC 2.70 (L) 09/02/2015 0745   HGB 8.0 (L) 09/02/2015 0745   HCT 24.7 (L) 09/02/2015 0745   HCT 23.0 (L) 06/03/2015 0542   PLT 297 09/02/2015 0745   MCV 91.5 09/02/2015 0745   MCH 29.6 09/02/2015 0745   MCHC 32.4 09/02/2015 0745   RDW 15.7 (H) 09/02/2015 0745   LYMPHSABS 1.1 08/29/2015 1650   MONOABS 1.3  (H) 08/29/2015 1650   EOSABS 0.1 08/29/2015 1650   BASOSABS 0.0 08/29/2015 1650      Chemistry      Component Value Date/Time   NA 138 09/02/2015 0745   K 4.0 09/02/2015 0745   CL 112 (H) 09/02/2015 0745   CO2 20 (L) 09/02/2015 0745   BUN 9 09/02/2015 0745   CREATININE 1.64 (H) 09/02/2015 0745   CREATININE 1.73 (H) 09/13/2014 0924      Component Value Date/Time   CALCIUM 7.9 (L) 09/02/2015 0745   ALKPHOS 103 08/29/2015 1650   AST 51 (H) 08/29/2015 1650   ALT 32 08/29/2015 1650   BILITOT 0.4 08/29/2015 1650        PENDING LABS:   RADIOGRAPHIC STUDIES:  No results found.   PATHOLOGY:    ASSESSMENT AND PLAN:  Adenocarcinoma of pancreas ([place]) Stage IV Pancreatic cancer; chemotherapy at [facility] with Dr. Rushie Nyhan and radiation in [place] with Dr. Pablo Ledger Pulmonary metastases Abdominal pain Intolerance to multiple lines of chemotherapy End of Life discussion Goals of Care Discussion  He has tried Gemzar, abraxane, xeloda, onyvide and discontinued all of them.    No role for labs today.  He had a visit to the ED recently in [place], [place] at which time, according to the patient, he was defibrillated.    He has a cardiology appointment tomorrow for follow-up.  He is enrolled with Hospice and they visit him twice per week.  His pain is well controlled at this time.  He denies any constipation at this time.  He wishes to return in 8 weeks for follow-up.   ORDERS PLACED FOR THIS ENCOUNTER: Orders Placed This Encounter  Procedures  . DNR (Do Not Resuscitate)    MEDICATIONS PRESCRIBED THIS ENCOUNTER: Meds ordered this encounter  Medications  . CARTIA XT 120 MG 24 hr capsule  . metoprolol succinate (TOPROL-XL) 50 MG 24 hr tablet  . diltiazem (CARDIZEM) 120 MG tablet    Sig: Take 120 mg by mouth daily.    THERAPY PLAN:  Ongoing comfort care.  All questions were answered. The patient knows to call the clinic with any problems, questions or concerns. We  can certainly see the patient much sooner if necessary.  Patient and plan discussed with Dr. Ancil Linsey and she is in agreement with the aforementioned.   This note is electronically signed by: Doy Mince 10/04/2015 3:06 PM

## 2015-10-04 NOTE — Patient Instructions (Signed)
[name] at [facility] Discharge Instructions  RECOMMENDATIONS MADE BY THE CONSULTANT AND ANY TEST RESULTS WILL BE SENT TO YOUR REFERRING PHYSICIAN.  You were seen by William Rivera today. Follow up with cardiology tomorrow as scheduled. Return in 8 weeks for follow up  Thank you for choosing [place] at [facility] to provide your oncology and hematology care.  To afford each patient quality time with our provider, please arrive at least 15 minutes before your scheduled appointment time.   Beginning January 23rd 2017 lab work for the [name] will be done in the  Main lab at [facility] on 1st floor. If you have a lab appointment with the [name] please come in thru the  Main Entrance and check in at the main information desk  You need to re-schedule your appointment should you arrive 10 or more minutes late.  We strive to give you quality time with our providers, and arriving late affects you and other patients whose appointments are after yours.  Also, if you no show three or more times for appointments you may be dismissed from the clinic at the providers discretion.     Again, thank you for choosing [facility].  Our hope is that these requests will decrease the amount of time that you wait before being seen by our physicians.       _____________________________________________________________  Should you have questions after your visit to [facility], please contact our office at [phone] between the hours of 8:30 a.m. and 4:30 p.m.  Voicemails left after 4:30 p.m. will not be returned until the following business day.  For prescription refill requests, have your pharmacy contact our office.         Resources For Cancer Patients and their Caregivers ? American Cancer Society: Can assist with transportation, wigs, general needs, runs Look Good Feel Better.        620-111-4634 ? Cancer  Care: Provides financial assistance, online support groups, medication/co-pay assistance.  1-800-813-HOPE 520-005-0794) ? [facility] Assists [place] cancer patients and their families through emotional , educational and financial support.  872-740-8544 ? [place] DSS Where to apply for food stamps, Medicaid and utility assistance. 360-367-2150 ? [facility]: Transportation to medical appointments. (575)569-0080 ? Social Security Administration: May apply for disability if have a Stage IV cancer. 928-728-1583 215-357-2927 ? [employer], Disability and Transit Services: Assists with nutrition, care and transit needs. [place] Support Programs: @10RELATIVEDAYS @ > Cancer Support Group  2nd Tuesday of the month 1pm-2pm, Journey Room  > Creative Journey  3rd Tuesday of the month 1130am-1pm, Journey Room  > Look Good Feel Better  1st Wednesday of the month 10am-12 noon, Journey Room (Call [place] to register [phone])

## 2015-10-04 NOTE — Assessment & Plan Note (Addendum)
Stage IV Pancreatic cancer; chemotherapy at [facility] with Dr. Rushie Nyhan and radiation in [place] with Dr. Pablo Ledger Pulmonary metastases Abdominal pain Intolerance to multiple lines of chemotherapy End of Life discussion Goals of Care Discussion  He has tried Gemzar, abraxane, xeloda, onyvide and discontinued all of them.    No role for labs today.  He had a visit to the ED recently in [place], [place] at which time, according to the patient, he was defibrillated.    He has a cardiology appointment tomorrow for follow-up.  He is enrolled with Hospice and they visit him twice per week.  His pain is well controlled at this time.  He denies any constipation at this time.  He wishes to return in 8 weeks for follow-up.

## 2015-10-05 DIAGNOSIS — C259 Malignant neoplasm of pancreas, unspecified: Secondary | ICD-10-CM | POA: Diagnosis not present

## 2015-10-05 DIAGNOSIS — M109 Gout, unspecified: Secondary | ICD-10-CM | POA: Diagnosis not present

## 2015-10-05 DIAGNOSIS — I10 Essential (primary) hypertension: Secondary | ICD-10-CM | POA: Diagnosis not present

## 2015-10-05 DIAGNOSIS — K219 Gastro-esophageal reflux disease without esophagitis: Secondary | ICD-10-CM | POA: Diagnosis not present

## 2015-10-05 DIAGNOSIS — I480 Paroxysmal atrial fibrillation: Secondary | ICD-10-CM | POA: Diagnosis not present

## 2015-10-05 DIAGNOSIS — R52 Pain, unspecified: Secondary | ICD-10-CM | POA: Diagnosis not present

## 2015-10-09 DIAGNOSIS — R52 Pain, unspecified: Secondary | ICD-10-CM | POA: Diagnosis not present

## 2015-10-09 DIAGNOSIS — I10 Essential (primary) hypertension: Secondary | ICD-10-CM | POA: Diagnosis not present

## 2015-10-09 DIAGNOSIS — K219 Gastro-esophageal reflux disease without esophagitis: Secondary | ICD-10-CM | POA: Diagnosis not present

## 2015-10-09 DIAGNOSIS — M109 Gout, unspecified: Secondary | ICD-10-CM | POA: Diagnosis not present

## 2015-10-09 DIAGNOSIS — C259 Malignant neoplasm of pancreas, unspecified: Secondary | ICD-10-CM | POA: Diagnosis not present

## 2015-10-12 DIAGNOSIS — R52 Pain, unspecified: Secondary | ICD-10-CM | POA: Diagnosis not present

## 2015-10-12 DIAGNOSIS — C259 Malignant neoplasm of pancreas, unspecified: Secondary | ICD-10-CM | POA: Diagnosis not present

## 2015-10-12 DIAGNOSIS — M109 Gout, unspecified: Secondary | ICD-10-CM | POA: Diagnosis not present

## 2015-10-12 DIAGNOSIS — I10 Essential (primary) hypertension: Secondary | ICD-10-CM | POA: Diagnosis not present

## 2015-10-12 DIAGNOSIS — K219 Gastro-esophageal reflux disease without esophagitis: Secondary | ICD-10-CM | POA: Diagnosis not present

## 2015-10-14 DIAGNOSIS — Z886 Allergy status to analgesic agent status: Secondary | ICD-10-CM | POA: Diagnosis not present

## 2015-10-14 DIAGNOSIS — Z881 Allergy status to other antibiotic agents status: Secondary | ICD-10-CM | POA: Diagnosis not present

## 2015-10-14 DIAGNOSIS — R002 Palpitations: Secondary | ICD-10-CM | POA: Diagnosis not present

## 2015-10-14 DIAGNOSIS — Z8546 Personal history of malignant neoplasm of prostate: Secondary | ICD-10-CM | POA: Diagnosis not present

## 2015-10-16 DIAGNOSIS — I10 Essential (primary) hypertension: Secondary | ICD-10-CM | POA: Diagnosis not present

## 2015-10-16 DIAGNOSIS — C259 Malignant neoplasm of pancreas, unspecified: Secondary | ICD-10-CM | POA: Diagnosis not present

## 2015-10-16 DIAGNOSIS — M109 Gout, unspecified: Secondary | ICD-10-CM | POA: Diagnosis not present

## 2015-10-16 DIAGNOSIS — K219 Gastro-esophageal reflux disease without esophagitis: Secondary | ICD-10-CM | POA: Diagnosis not present

## 2015-10-16 DIAGNOSIS — R52 Pain, unspecified: Secondary | ICD-10-CM | POA: Diagnosis not present

## 2015-10-19 DIAGNOSIS — I10 Essential (primary) hypertension: Secondary | ICD-10-CM | POA: Diagnosis not present

## 2015-10-19 DIAGNOSIS — K219 Gastro-esophageal reflux disease without esophagitis: Secondary | ICD-10-CM | POA: Diagnosis not present

## 2015-10-19 DIAGNOSIS — C259 Malignant neoplasm of pancreas, unspecified: Secondary | ICD-10-CM | POA: Diagnosis not present

## 2015-10-19 DIAGNOSIS — M109 Gout, unspecified: Secondary | ICD-10-CM | POA: Diagnosis not present

## 2015-10-19 DIAGNOSIS — R52 Pain, unspecified: Secondary | ICD-10-CM | POA: Diagnosis not present

## 2015-10-22 DIAGNOSIS — C259 Malignant neoplasm of pancreas, unspecified: Secondary | ICD-10-CM | POA: Diagnosis not present

## 2015-10-22 DIAGNOSIS — M109 Gout, unspecified: Secondary | ICD-10-CM | POA: Diagnosis not present

## 2015-10-22 DIAGNOSIS — K219 Gastro-esophageal reflux disease without esophagitis: Secondary | ICD-10-CM | POA: Diagnosis not present

## 2015-10-22 DIAGNOSIS — I10 Essential (primary) hypertension: Secondary | ICD-10-CM | POA: Diagnosis not present

## 2015-10-22 DIAGNOSIS — R52 Pain, unspecified: Secondary | ICD-10-CM | POA: Diagnosis not present

## 2015-10-23 DIAGNOSIS — I10 Essential (primary) hypertension: Secondary | ICD-10-CM | POA: Diagnosis not present

## 2015-10-23 DIAGNOSIS — R52 Pain, unspecified: Secondary | ICD-10-CM | POA: Diagnosis not present

## 2015-10-23 DIAGNOSIS — C259 Malignant neoplasm of pancreas, unspecified: Secondary | ICD-10-CM | POA: Diagnosis not present

## 2015-10-23 DIAGNOSIS — K219 Gastro-esophageal reflux disease without esophagitis: Secondary | ICD-10-CM | POA: Diagnosis not present

## 2015-10-23 DIAGNOSIS — M109 Gout, unspecified: Secondary | ICD-10-CM | POA: Diagnosis not present

## 2015-10-26 DIAGNOSIS — M109 Gout, unspecified: Secondary | ICD-10-CM | POA: Diagnosis not present

## 2015-10-26 DIAGNOSIS — I10 Essential (primary) hypertension: Secondary | ICD-10-CM | POA: Diagnosis not present

## 2015-10-26 DIAGNOSIS — C259 Malignant neoplasm of pancreas, unspecified: Secondary | ICD-10-CM | POA: Diagnosis not present

## 2015-10-26 DIAGNOSIS — K219 Gastro-esophageal reflux disease without esophagitis: Secondary | ICD-10-CM | POA: Diagnosis not present

## 2015-10-26 DIAGNOSIS — R52 Pain, unspecified: Secondary | ICD-10-CM | POA: Diagnosis not present

## 2015-10-30 DIAGNOSIS — M109 Gout, unspecified: Secondary | ICD-10-CM | POA: Diagnosis not present

## 2015-10-30 DIAGNOSIS — R52 Pain, unspecified: Secondary | ICD-10-CM | POA: Diagnosis not present

## 2015-10-30 DIAGNOSIS — K219 Gastro-esophageal reflux disease without esophagitis: Secondary | ICD-10-CM | POA: Diagnosis not present

## 2015-10-30 DIAGNOSIS — I10 Essential (primary) hypertension: Secondary | ICD-10-CM | POA: Diagnosis not present

## 2015-10-30 DIAGNOSIS — C259 Malignant neoplasm of pancreas, unspecified: Secondary | ICD-10-CM | POA: Diagnosis not present

## 2015-10-31 DIAGNOSIS — Z6821 Body mass index (BMI) 21.0-21.9, adult: Secondary | ICD-10-CM | POA: Diagnosis not present

## 2015-10-31 DIAGNOSIS — G894 Chronic pain syndrome: Secondary | ICD-10-CM | POA: Diagnosis not present

## 2015-10-31 DIAGNOSIS — N189 Chronic kidney disease, unspecified: Secondary | ICD-10-CM | POA: Diagnosis not present

## 2015-11-04 DIAGNOSIS — I10 Essential (primary) hypertension: Secondary | ICD-10-CM | POA: Diagnosis not present

## 2015-11-04 DIAGNOSIS — M109 Gout, unspecified: Secondary | ICD-10-CM | POA: Diagnosis present

## 2015-11-04 DIAGNOSIS — K219 Gastro-esophageal reflux disease without esophagitis: Secondary | ICD-10-CM | POA: Diagnosis not present

## 2015-11-04 DIAGNOSIS — N179 Acute kidney failure, unspecified: Secondary | ICD-10-CM | POA: Diagnosis not present

## 2015-11-04 DIAGNOSIS — I471 Supraventricular tachycardia: Secondary | ICD-10-CM | POA: Diagnosis not present

## 2015-11-04 DIAGNOSIS — C7800 Secondary malignant neoplasm of unspecified lung: Secondary | ICD-10-CM | POA: Diagnosis not present

## 2015-11-04 DIAGNOSIS — I959 Hypotension, unspecified: Secondary | ICD-10-CM | POA: Diagnosis not present

## 2015-11-04 DIAGNOSIS — M1990 Unspecified osteoarthritis, unspecified site: Secondary | ICD-10-CM | POA: Diagnosis present

## 2015-11-04 DIAGNOSIS — I427 Cardiomyopathy due to drug and external agent: Secondary | ICD-10-CM | POA: Diagnosis not present

## 2015-11-04 DIAGNOSIS — I480 Paroxysmal atrial fibrillation: Secondary | ICD-10-CM | POA: Diagnosis not present

## 2015-11-04 DIAGNOSIS — R748 Abnormal levels of other serum enzymes: Secondary | ICD-10-CM | POA: Diagnosis not present

## 2015-11-04 DIAGNOSIS — I4892 Unspecified atrial flutter: Secondary | ICD-10-CM | POA: Diagnosis not present

## 2015-11-04 DIAGNOSIS — C259 Malignant neoplasm of pancreas, unspecified: Secondary | ICD-10-CM | POA: Diagnosis not present

## 2015-11-09 DIAGNOSIS — E441 Mild protein-calorie malnutrition: Secondary | ICD-10-CM | POA: Diagnosis not present

## 2015-11-09 DIAGNOSIS — C259 Malignant neoplasm of pancreas, unspecified: Secondary | ICD-10-CM | POA: Diagnosis not present

## 2015-11-09 DIAGNOSIS — I951 Orthostatic hypotension: Secondary | ICD-10-CM | POA: Diagnosis not present

## 2015-11-09 DIAGNOSIS — I4891 Unspecified atrial fibrillation: Secondary | ICD-10-CM | POA: Diagnosis not present

## 2015-11-09 DIAGNOSIS — I959 Hypotension, unspecified: Secondary | ICD-10-CM | POA: Diagnosis not present

## 2015-11-09 DIAGNOSIS — Z1389 Encounter for screening for other disorder: Secondary | ICD-10-CM | POA: Diagnosis not present

## 2015-11-09 DIAGNOSIS — C257 Malignant neoplasm of other parts of pancreas: Secondary | ICD-10-CM | POA: Diagnosis not present

## 2015-11-09 DIAGNOSIS — K219 Gastro-esophageal reflux disease without esophagitis: Secondary | ICD-10-CM | POA: Diagnosis not present

## 2015-11-09 DIAGNOSIS — Z6822 Body mass index (BMI) 22.0-22.9, adult: Secondary | ICD-10-CM | POA: Diagnosis not present

## 2015-11-15 DIAGNOSIS — C259 Malignant neoplasm of pancreas, unspecified: Secondary | ICD-10-CM | POA: Diagnosis not present

## 2015-11-15 DIAGNOSIS — R0609 Other forms of dyspnea: Secondary | ICD-10-CM | POA: Diagnosis not present

## 2015-11-15 DIAGNOSIS — I480 Paroxysmal atrial fibrillation: Secondary | ICD-10-CM | POA: Diagnosis not present

## 2015-11-29 ENCOUNTER — Ambulatory Visit (HOSPITAL_COMMUNITY): Admitting: Oncology

## 2015-11-29 DIAGNOSIS — I480 Paroxysmal atrial fibrillation: Secondary | ICD-10-CM | POA: Diagnosis not present

## 2015-11-29 DIAGNOSIS — C259 Malignant neoplasm of pancreas, unspecified: Secondary | ICD-10-CM | POA: Diagnosis not present

## 2015-11-29 DIAGNOSIS — R0609 Other forms of dyspnea: Secondary | ICD-10-CM | POA: Diagnosis not present

## 2015-12-05 DIAGNOSIS — H43391 Other vitreous opacities, right eye: Secondary | ICD-10-CM | POA: Diagnosis not present

## 2015-12-05 DIAGNOSIS — H5711 Ocular pain, right eye: Secondary | ICD-10-CM | POA: Diagnosis not present

## 2015-12-18 ENCOUNTER — Emergency Department (HOSPITAL_COMMUNITY): Payer: Medicare Other

## 2015-12-18 ENCOUNTER — Encounter (HOSPITAL_COMMUNITY): Payer: Self-pay | Admitting: Emergency Medicine

## 2015-12-18 ENCOUNTER — Emergency Department (HOSPITAL_COMMUNITY)
Admission: EM | Admit: 2015-12-18 | Discharge: 2015-12-18 | Disposition: A | Discharge status: Home / Self Care | Payer: Medicare Other | Attending: Emergency Medicine | Admitting: Emergency Medicine

## 2015-12-18 DIAGNOSIS — I10 Essential (primary) hypertension: Secondary | ICD-10-CM | POA: Insufficient documentation

## 2015-12-18 DIAGNOSIS — R0902 Hypoxemia: Secondary | ICD-10-CM | POA: Diagnosis not present

## 2015-12-18 DIAGNOSIS — C7800 Secondary malignant neoplasm of unspecified lung: Secondary | ICD-10-CM

## 2015-12-18 DIAGNOSIS — C799 Secondary malignant neoplasm of unspecified site: Secondary | ICD-10-CM | POA: Diagnosis not present

## 2015-12-18 DIAGNOSIS — R0602 Shortness of breath: Secondary | ICD-10-CM

## 2015-12-18 DIAGNOSIS — J90 Pleural effusion, not elsewhere classified: Secondary | ICD-10-CM | POA: Diagnosis not present

## 2015-12-18 DIAGNOSIS — J181 Lobar pneumonia, unspecified organism: Secondary | ICD-10-CM

## 2015-12-18 DIAGNOSIS — J910 Malignant pleural effusion: Secondary | ICD-10-CM | POA: Diagnosis not present

## 2015-12-18 DIAGNOSIS — C259 Malignant neoplasm of pancreas, unspecified: Secondary | ICD-10-CM | POA: Insufficient documentation

## 2015-12-18 LAB — COMPREHENSIVE METABOLIC PANEL
ALT: 27 U/L (ref 17–63)
AST: 50 U/L — AB (ref 15–41)
Albumin: 1.9 g/dL — ABNORMAL LOW (ref 3.5–5.0)
Alkaline Phosphatase: 523 U/L — ABNORMAL HIGH (ref 38–126)
Anion gap: 1 — ABNORMAL LOW (ref 5–15)
BUN: 13 mg/dL (ref 6–20)
CHLORIDE: 105 mmol/L (ref 101–111)
CO2: 28 mmol/L (ref 22–32)
Calcium: 8.2 mg/dL — ABNORMAL LOW (ref 8.9–10.3)
Creatinine, Ser: 1.17 mg/dL (ref 0.61–1.24)
GFR, EST NON AFRICAN AMERICAN: 58 mL/min — AB (ref >60)
Glucose, Bld: 125 mg/dL — ABNORMAL HIGH (ref 65–99)
POTASSIUM: 4.5 mmol/L (ref 3.5–5.1)
Sodium: 134 mmol/L — ABNORMAL LOW (ref 135–145)
Total Bilirubin: 1.1 mg/dL (ref 0.3–1.2)
Total Protein: 6 g/dL — ABNORMAL LOW (ref 6.5–8.1)

## 2015-12-18 LAB — CBC WITH DIFFERENTIAL/PLATELET
BASOS ABS: 0 10*3/uL (ref 0.0–0.1)
Basophils Relative: 0 %
EOS PCT: 1 %
Eosinophils Absolute: 0 10*3/uL (ref 0.0–0.7)
HCT: 26.5 % — ABNORMAL LOW (ref 39.0–52.0)
Hemoglobin: 8.7 g/dL — ABNORMAL LOW (ref 13.0–17.0)
LYMPHS PCT: 19 %
Lymphs Abs: 1.3 10*3/uL (ref 0.7–4.0)
MCH: 31.4 pg (ref 26.0–34.0)
MCHC: 32.8 g/dL (ref 30.0–36.0)
MCV: 95.7 fL (ref 78.0–100.0)
MONO ABS: 0.7 10*3/uL (ref 0.1–1.0)
Monocytes Relative: 10 %
Neutro Abs: 4.8 10*3/uL (ref 1.7–7.7)
Neutrophils Relative %: 70 %
PLATELETS: 270 10*3/uL (ref 150–400)
RBC: 2.77 MIL/uL — ABNORMAL LOW (ref 4.22–5.81)
RDW: 16 % — AB (ref 11.5–15.5)
WBC: 6.8 10*3/uL (ref 4.0–10.5)

## 2015-12-18 LAB — BRAIN NATRIURETIC PEPTIDE: B NATRIURETIC PEPTIDE 5: 172 pg/mL — AB (ref 0.0–100.0)

## 2015-12-18 LAB — TROPONIN I: TROPONIN I: 0.03 ng/mL — AB (ref <0.03)

## 2015-12-18 MED ORDER — AMOXICILLIN 500 MG PO CAPS: 500.0000 mg | ORAL_CAPSULE | Freq: Three times a day (TID) | ORAL | 0 refills | Status: DC

## 2015-12-18 MED ORDER — IOPAMIDOL (ISOVUE-370) INJECTION 76%
100.0000 mL | Freq: Once | INTRAVENOUS | Status: AC | PRN
  Administered 2015-12-18: 100 mL via INTRAVENOUS

## 2015-12-18 MED ORDER — ALBUTEROL SULFATE (2.5 MG/3ML) 0.083% IN NEBU
5.0000 mg | INHALATION_SOLUTION | Freq: Once | RESPIRATORY_TRACT | Status: AC
  Administered 2015-12-18: 5 mg via RESPIRATORY_TRACT
  Filled 2015-12-18: qty 6

## 2015-12-18 MED ORDER — CIPROFLOXACIN HCL 500 MG PO TABS: 500.0000 mg | ORAL_TABLET | Freq: Two times a day (BID) | ORAL | 0 refills | Status: AC

## 2015-12-18 NOTE — ED Notes (Signed)
CRITICAL VALUE ALERT  Critical value received:  Troponin 0.03  Date of notification:  12/18/15  Time of notification:  [id number]  Critical value read back:Yes.    Nurse who received alert:  c Hien Perreira  Responding MD:  Dr Reather Converse  Time MD responded:  [phone]

## 2015-12-18 NOTE — ED Notes (Signed)
Pt returned from ct

## 2015-12-18 NOTE — ED Triage Notes (Signed)
Pt reports increasing SOB over the past 3 days. Pt states he has had to turn his oxygen "all the way up." Pt has pancreatic and lung CA. Pt finished chemo and Radiation in February.

## 2015-12-18 NOTE — ED Notes (Signed)
Pt taken to ct 

## 2015-12-18 NOTE — Discharge Instructions (Addendum)
Use nasal cannula oxygenation as needed. Take antibiotics for one week.  If you were given medicines take as directed.  If you are on coumadin or contraceptives realize their levels and effectiveness is altered by many different medicines.  If you have any reaction (rash, tongues swelling, other) to the medicines stop taking and see a physician.    If your blood pressure was elevated in the ER make sure you follow up for management with a primary doctor or return for chest pain, shortness of breath or stroke symptoms.  Please follow up as directed and return to the ER or see a physician for new or worsening symptoms.  Thank you. Vitals:   12/18/15 1215 12/18/15 1230 12/18/15 1245 12/18/15 1300  BP:  107/75  109/71  Pulse: 70  71   Resp: 20 13 19    Temp:      TempSrc:      SpO2: 100%  100%   Weight:      Height:

## 2015-12-18 NOTE — ED Notes (Signed)
Pt receiving breathing tx 

## 2015-12-18 NOTE — ED Notes (Signed)
Pt returned from xray. Awaiting ct. Nad. No changes. Pt has been resting.

## 2015-12-18 NOTE — ED Provider Notes (Signed)
[facility] DEPT Provider Note   CSN: [id number] Arrival date & time: 12/18/15  1113  By signing my name below, I, [name], attest that this documentation has been prepared under the direction and in the presence of [name], MD . Electronically Signed: [name], Scribe. 12/18/2015. 11:56 AM.  History   Chief Complaint Chief Complaint  Patient presents with  . Shortness of Breath   The history is provided by the patient. No language interpreter was used.   HPI Comments: William Rivera is a 80 y.o. male with PMHx of HTN, A-Fib, pancreatic cancer and lung cancer, who presents to the Emergency Department complaining of gradually worsening, shortness of breath that began 3 days ago. Pt reports he normally uses 4L of O2 at home but "had to turn his O2 all the way up to 5L last night" with no relief of his shortness of breath. He notes associated central chest pain last night that radiated into his left arm. He states his last chemotherapy and radiation was in February. Pt denies fever, chills, recent weight changes, leg swelling, known fluid in his lungs and hx of PE in his lungs.   Past Medical History:  Diagnosis Date  . Adenomatous colon polyp 2010  . Barrett's esophagus    last EGD/Bx 12/11  . Cancer of pancreas, other site AUG 2016   CA 19-9 958.6  . GASTRITIS 09/23/2008   Qualifier: Diagnosis of  By: Nicole Kindred LPN, Doris    . GERD (gastroesophageal reflux disease)   . Gout   . Helicobacter pylori gastritis 2000   s/p treatment  . Hypertension   . Pancreatic cancer ([place])   . Pancreatic cancer ([place])   . Paroxysmal SVT (supraventricular tachycardia) ([place]) 08/30/2015    Patient Active Problem List   Diagnosis Date Noted  . Paroxysmal SVT (supraventricular tachycardia) ([place]) 08/30/2015  . Acute GI bleeding 08/29/2015  . Symptomatic anemia   . Palliative care encounter   . Duodenal ulcer with hemorrhage and obstruction   . Protein-calorie malnutrition, severe 06/03/2015    . GI bleeding 06/02/2015  . Acute blood loss anemia 06/02/2015  . AKI (acute kidney injury) ([place]) 06/02/2015  . Syncope 06/02/2015  . Faintness   . Tachycardia   . Hematochezia   . Duodenal stricture   . Obstructive jaundice 01/02/2015  . Pruritus 09/14/2014  . Transaminitis 09/14/2014  . Adenocarcinoma of pancreas ([place]) 09/05/2014  . Pancreatitis 09/05/2014  . Chest pain 01/24/2014  . Dysphagia 11/25/2012  . Adenomatous polyp of colon 08/06/2012  . BARRETTS ESOPHAGUS 02/26/2010  . ALCOHOL USE 09/29/2008  . GOUT, UNSPECIFIED 09/23/2008  . Essential hypertension 09/23/2008  . SINUSITIS, CHRONIC 09/23/2008  . GERD 09/23/2008  . Nonulcer dyspepsia 09/23/2008  . ARTHRITIS 09/23/2008    Past Surgical History:  Procedure Laterality Date  . BILIARY STENT PLACEMENT N/A 09/14/2014   Procedure: BILIARY STENT PLACEMENT;  Surgeon: Rogene Houston, MD;  Location: [facility];  Service: Endoscopy;  Laterality: N/A;  . BLADDER SURGERY  [id number]  . COLONOSCOPY  2010   simple adenomas  . COLONOSCOPY N/A 07/28/2013   Dr. Oneida Alar: tubular adenoma, mild sigmoid colon diverticulosis, internal hemorhhoids   . DOPPLER ECHOCARDIOGRAPHY  2009  . ERCP N/A 09/14/2014   Dr. Laural Golden: sphincterotomy with 8 F plastic biliary stent placement   . ERCP N/A 01/04/2015   Procedure: ATTEMPTED ENDOSCOPIC RETROGRADE CHOLANGIOPANCREATOGRAPHY (ERCP);  Surgeon: Daneil Dolin, MD;  Location: [facility];  Service: Endoscopy;  Laterality: N/A;  . ERCP  02/2015   [facility]: biliary stent exchange from plastic to metal stent  . ESOPHAGOGASTRODUODENOSCOPY  2008   DUODENITIS & GASTRITIS 2o TO NSAIDS/ETOH  . ESOPHAGOGASTRODUODENOSCOPY  12/2009   short segment Barrett's, small hh, chronic gastritis  . ESOPHAGOGASTRODUODENOSCOPY  01/04/2011   [id number] gastritis/Barrett's, possible  . ESOPHAGOGASTRODUODENOSCOPY N/A 11/30/2012   Dr. Oneida Alar- normal esophagus, empiric dilation d/t c/o dysphagia/?cervical web, stomach= mild non erosive  gastritis, inflammation on bx, duodenum= no abnormalities in the bulb and second portion of the duodenum. dilation at the gastroesphageal junction.  . ESOPHAGOGASTRODUODENOSCOPY N/A 06/02/2015   SLF: 1. normal esophagus 2. gastrtitis mild 3. GI bleed due to large duodenal ulcer with large visible vessel, high risk to rebleed without prevention   . ESOPHAGOGASTRODUODENOSCOPY N/A 08/30/2015   Procedure: ESOPHAGOGASTRODUODENOSCOPY (EGD);  Surgeon: Gatha Mayer, MD;  Location[facility] ENDOSCOPY;  Service: Endoscopy;  Laterality: N/A;  . ESOPHAGOGASTRODUODENOSCOPY (EGD) WITH PROPOFOL N/A 01/04/2015   RMR: partial uoddenal obstruction at junction of 1st and 2nd portion of duodenum, unale to intubate 2nd portion of duodenum to perform ERCP  . HEMORRHOID SURGERY    . IR GENERIC HISTORICAL  08/30/2015   IR US GUIDE VASC ACCESS RIGHT 08/30/2015 Corrie Mckusick, DO [facility]  . IR GENERIC HISTORICAL  08/30/2015   IR ANGIOGRAM SELECTIVE EACH ADDITIONAL VESSEL 08/30/2015 Corrie Mckusick, DO [facility]  . IR GENERIC HISTORICAL  08/30/2015   IR ANGIOGRAM VISCERAL SELECTIVE 08/30/2015 Corrie Mckusick, DO [facility]  . IR GENERIC HISTORICAL  08/30/2015   IR ANGIOGRAM FOLLOW UP STUDY 08/30/2015 Corrie Mckusick, DO [facility]  . IR GENERIC HISTORICAL  08/30/2015   IR FLUORO GUIDE CV LINE RIGHT 08/30/2015 Corrie Mckusick, DO [facility]  . IR GENERIC HISTORICAL  08/30/2015   IR EMBO ART  VEN HEMORR LYMPH EXTRAV  INC GUIDE ROADMAPPING 08/30/2015 Corrie Mckusick, DO [facility]  . IR GENERIC HISTORICAL  08/30/2015   IR ANGIOGRAM SELECTIVE EACH ADDITIONAL VESSEL 08/30/2015 Corrie Mckusick, DO [facility]  . IR GENERIC HISTORICAL  08/30/2015   IR ANGIOGRAM VISCERAL SELECTIVE 08/30/2015 Corrie Mckusick, DO [facility]  . IR GENERIC HISTORICAL  08/30/2015   IR US GUIDE VASC ACCESS RIGHT 08/30/2015 Corrie Mckusick, DO [facility]  . MALONEY DILATION N/A 11/30/2012   Procedure: MALONEY DILATION;  Surgeon: Danie Binder, MD;  Location: [facility] SU[facility];  Service:  Endoscopy;  Laterality: N/A;  . NASAL SEPTUM SURGERY    . NECK SURGERY     DISC REPLACED  . NM MYOVIEW LTD  2009  . REPLACEMENT TOTAL KNEE Right 11/2010  . SAVORY DILATION N/A 11/30/2012   Procedure: SAVORY DILATION;  Surgeon: Danie Binder, MD;  Location: [facility] SU[facility];  Service: Endoscopy;  Laterality: N/A;  . SINUS EXPLORATION  2000  . SPHINCTEROTOMY N/A 09/14/2014   Procedure: SPHINCTEROTOMY;  Surgeon: Rogene Houston, MD;  Location: [facility];  Service: Endoscopy;  Laterality: N/A;  . stents in bile ducts    . TONSILLECTOMY     AS A CHILD    Home Medications    Prior to Admission medications   Medication Sig Start Date End Date Taking? Authorizing Provider  CARTIA XT 120 MG 24 hr capsule  09/30/15   Historical Provider, MD  diltiazem (CARDIZEM) 120 MG tablet Take 120 mg by mouth daily.    Historical Provider, MD  feeding supplement (BOOST / RESOURCE BREEZE) LIQD Take 1 Container by mouth 3 (three) times daily with meals. 06/05/15   Reyne Dumas, MD  GAS  RELIEF 125 MG CAPS TAKE ONE TO TWO SOFTGELS BY MOUTH DAILY AS NEEDED FOR FLATULENCE 06/02/15   Historical Provider, MD  hydrocortisone (ANUSOL-HC) 2.5 % rectal cream Place 1 application rectally 2 (two) times daily. 08/28/15   Annitta Needs, NP  metoprolol succinate (TOPROL-XL) 100 MG 24 hr tablet Take 100 mg by mouth daily.  08/22/15   Historical Provider, MD  metoprolol succinate (TOPROL-XL) 50 MG 24 hr tablet  09/11/15   Historical Provider, MD  Oxycodone HCl 10 MG TABS Take 1 tablet (10 mg total) by mouth every 4 (four) hours as needed. Patient taking differently: Take 10 mg by mouth 2 (two) times daily as needed (FOR PAIN).  08/24/15   Patrici Ranks, MD  pantoprazole (PROTONIX) 40 MG tablet Take 1 tablet (40 mg total) by mouth 2 (two) times daily. Patient taking differently: Take 40 mg by mouth daily.  06/05/15   Reyne Dumas, MD  sucralfate (CARAFATE) 1 g tablet Take 1 g by mouth 3 (three) times daily.    Historical Provider, MD     Family History Family History  Problem Relation Age of Onset  . Stomach cancer Father 49    deceased  . Colon cancer Neg Hx     Social History Social History  Substance Use Topics  . Smoking status: Never Smoker  . Smokeless tobacco: Never Used     Comment: Never smoked much  . Alcohol use No     Comment: former drinks about a pint of borboun per week     Allergies   Doxycycline; Colestipol; and Doxycycline   Review of Systems Review of Systems  Constitutional: Negative for chills, fever and unexpected weight change.  Respiratory: Positive for shortness of breath.   Cardiovascular: Positive for chest pain. Negative for leg swelling.   Physical Exam Updated Vital Signs BP 128/71 (BP Location: Right Arm)   Pulse 63   Temp 97.1 F (36.2 C) (Oral)   Resp 18   Ht 5\' 9"  (1.753 m)   Wt 145 lb (65.8 kg)   SpO2 100%   BMI 21.41 kg/m   Physical Exam  Constitutional: He is oriented to person, place, and time. He appears well-developed and well-nourished.  HENT:  Head: Normocephalic.  Eyes: EOM are normal.  Neck: Normal range of motion.  Cardiovascular: Normal rate and regular rhythm.   Pulmonary/Chest:  Anterior lung fields clear. Decreased breath sounds in right lower lung field, sparse crackles.   Abdominal: He exhibits no distension.  Mild tenderness to his central abdomen, no peritonitis.   Musculoskeletal: Normal range of motion. He exhibits edema.  No significant swelling in legs. Edema to LUE. 2+ pulses.   Neurological: He is alert and oriented to person, place, and time.  Psychiatric: He has a normal mood and affect.  Nursing note and vitals reviewed.  ED Treatments / Results  Labs (all labs ordered are listed, but only abnormal results are displayed) Labs Reviewed  CBC WITH DIFFERENTIAL/PLATELET - Abnormal; Notable for the following:       Result Value   RBC 2.77 (*)    Hemoglobin 8.7 (*)    HCT 26.5 (*)    RDW 16.0 (*)    All other components  within normal limits  BRAIN NATRIURETIC PEPTIDE - Abnormal; Notable for the following:    B Natriuretic Peptide 172.0 (*)    All other components within normal limits  TROPONIN I  COMPREHENSIVE METABOLIC PANEL    EKG  EKG Interpretation  Date/Time:  Monday December 18 2015 11:23:26 EST Ventricular Rate:  62 PR Interval:    QRS Duration: 86 QT Interval:  422 QTC Calculation: 429 R Axis:   16 Text Interpretation:  Sinus rhythm Abnormal R-wave progression, early transition Confirmed by Shantai Tiedeman MD, Jari Carollo [phone]) on 12/18/2015 11:55:15 AM       Radiology No results found.  Procedures Procedures (including critical care time)  Medications Ordered in ED Medications  albuterol (PROVENTIL) (2.5 MG/3ML) 0.083% nebulizer solution 5 mg (5 mg Nebulization Given 12/18/15 1203)    DIAGNOSTIC STUDIES:  Oxygen Saturation is 100% on N/C, normal by my interpretation.    COORDINATION OF CARE:  11:54 AM Discussed treatment plan with pt at bedside and pt agreed to plan.  Initial Impression / Assessment and Plan / ED Course  I have reviewed the triage vital signs and the nursing notes.  Pertinent labs & imaging results that were available during my care of the patient were reviewed by me and considered in my medical decision making (see chart for details).  Clinical Course    Patient presents with worsening dyspnea and requiring increased oxygen than his baseline. On exam concern for pleural effusion versus underlying cancer with decreased breath sounds. With active cancer history patient is no longer hospice more aggressive evaluation including CT angiogram for further details and look for blood clot. Patient on 3-4 L which is his baseline in the ER.  Patient stable on reassessment on home oxygen 3 L. No increased work of breathing. CT scan showed small consolidation likely worsening cancer. No indication for emergent admission at this time patient stable for outpatient follow-up with  oncology and primary doctor.  Results and differential diagnosis were discussed with the patient/parent/guardian. Xrays were independently reviewed by myself.  Close follow up outpatient was discussed, comfortable with the plan.   Medications  albuterol (PROVENTIL) (2.5 MG/3ML) 0.083% nebulizer solution 5 mg (5 mg Nebulization Given 12/18/15 1203)  iopamidol (ISOVUE-370) 76 % injection 100 mL (100 mLs Intravenous Contrast Given 12/18/15 1314)    Vitals:   12/18/15 1345 12/18/15 1400 12/18/15 1415 12/18/15 1430  BP:  114/62  129/66  Pulse: 77 76 75   Resp: 13 16 14 15   Temp:      TempSrc:      SpO2: 99% 100% 99%   Weight:      Height:        Final diagnoses:  Shortness of breath  Hypoxia  Pleural effusion  Malignant neoplasm metastatic to lung, unspecified laterality ([place])  Lung consolidation ([place])    Final Clinical Impressions(s) / ED Diagnoses   Final diagnoses:  Shortness of breath  Hypoxia    New Prescriptions New Prescriptions   No medications on file     [name], MD 12/18/15 1542

## 2015-12-18 NOTE — ED Notes (Signed)
Pt taken to xray 

## 2015-12-19 ENCOUNTER — Ambulatory Visit (HOSPITAL_COMMUNITY): Payer: Medicare Other | Admitting: Oncology

## 2015-12-28 ENCOUNTER — Ambulatory Visit (HOSPITAL_COMMUNITY): Payer: Medicare Other | Admitting: Oncology

## 2016-01-01 DIAGNOSIS — R600 Localized edema: Secondary | ICD-10-CM | POA: Diagnosis not present

## 2016-01-05 DIAGNOSIS — C259 Malignant neoplasm of pancreas, unspecified: Secondary | ICD-10-CM | POA: Diagnosis not present

## 2016-01-05 DIAGNOSIS — J9801 Acute bronchospasm: Secondary | ICD-10-CM | POA: Diagnosis not present

## 2016-01-05 DIAGNOSIS — E46 Unspecified protein-calorie malnutrition: Secondary | ICD-10-CM | POA: Diagnosis not present

## 2016-01-05 DIAGNOSIS — I4891 Unspecified atrial fibrillation: Secondary | ICD-10-CM | POA: Diagnosis not present

## 2016-01-05 DIAGNOSIS — R600 Localized edema: Secondary | ICD-10-CM | POA: Diagnosis not present

## 2016-01-05 DIAGNOSIS — J9610 Chronic respiratory failure, unspecified whether with hypoxia or hypercapnia: Secondary | ICD-10-CM | POA: Diagnosis not present

## 2016-01-05 DIAGNOSIS — Z1389 Encounter for screening for other disorder: Secondary | ICD-10-CM | POA: Diagnosis not present

## 2016-01-05 DIAGNOSIS — Z6823 Body mass index (BMI) 23.0-23.9, adult: Secondary | ICD-10-CM | POA: Diagnosis not present

## 2016-01-05 DIAGNOSIS — E441 Mild protein-calorie malnutrition: Secondary | ICD-10-CM | POA: Diagnosis not present

## 2016-01-05 DIAGNOSIS — N183 Chronic kidney disease, stage 3 (moderate): Secondary | ICD-10-CM | POA: Diagnosis not present

## 2016-01-08 ENCOUNTER — Encounter (HOSPITAL_COMMUNITY): Payer: Self-pay | Admitting: Emergency Medicine

## 2016-01-08 ENCOUNTER — Emergency Department (HOSPITAL_COMMUNITY): Payer: Medicare Other

## 2016-01-08 ENCOUNTER — Emergency Department (HOSPITAL_COMMUNITY)
Admission: EM | Admit: 2016-01-08 | Discharge: 2016-01-08 | Disposition: A | Discharge status: Home / Self Care | Payer: Medicare Other | Attending: Emergency Medicine | Admitting: Emergency Medicine

## 2016-01-08 DIAGNOSIS — I10 Essential (primary) hypertension: Secondary | ICD-10-CM | POA: Insufficient documentation

## 2016-01-08 DIAGNOSIS — R609 Edema, unspecified: Secondary | ICD-10-CM | POA: Diagnosis not present

## 2016-01-08 DIAGNOSIS — C7801 Secondary malignant neoplasm of right lung: Secondary | ICD-10-CM | POA: Diagnosis not present

## 2016-01-08 DIAGNOSIS — R05 Cough: Secondary | ICD-10-CM | POA: Diagnosis present

## 2016-01-08 DIAGNOSIS — J40 Bronchitis, not specified as acute or chronic: Secondary | ICD-10-CM

## 2016-01-08 DIAGNOSIS — Z79899 Other long term (current) drug therapy: Secondary | ICD-10-CM | POA: Insufficient documentation

## 2016-01-08 DIAGNOSIS — R0602 Shortness of breath: Secondary | ICD-10-CM | POA: Diagnosis not present

## 2016-01-08 DIAGNOSIS — Z8507 Personal history of malignant neoplasm of pancreas: Secondary | ICD-10-CM | POA: Diagnosis not present

## 2016-01-08 DIAGNOSIS — R079 Chest pain, unspecified: Secondary | ICD-10-CM | POA: Diagnosis not present

## 2016-01-08 MED ORDER — OXYCODONE HCL 5 MG PO TABS
10.0000 mg | ORAL_TABLET | Freq: Once | ORAL | Status: AC
  Administered 2016-01-08: 10 mg via ORAL
  Filled 2016-01-08: qty 2

## 2016-01-08 MED ORDER — PREDNISONE 50 MG PO TABS
60.0000 mg | ORAL_TABLET | Freq: Once | ORAL | Status: AC
  Administered 2016-01-08: 19:00:00 60 mg via ORAL
  Filled 2016-01-08: qty 1

## 2016-01-08 MED ORDER — PREDNISONE 10 MG PO TABS: 40.0000 mg | ORAL_TABLET | Freq: Every day | ORAL | 0 refills | Status: AC

## 2016-01-08 NOTE — ED Triage Notes (Signed)
Pt has cough with yellow sputum, was on Keflex, PCP stopped that on Friday. Pt state cough is non productive now. Sob at times, Nasal congestion

## 2016-01-08 NOTE — ED Notes (Signed)
Pt taken to ct 

## 2016-01-08 NOTE — ED Provider Notes (Signed)
[facility] DEPT Provider Note   CSN: [id number] Arrival date & time: 01/08/16  1454     History   Chief Complaint Chief Complaint  Patient presents with  . Cough    HPI William Rivera is a 80 y.o. male.  Patient just finished a course of Keflex for productive cough on Friday. Still with coughing. Patient has a history of metastatic pancreatic cancer. Followed by hematology oncology clinic upstairs. Patient normally on 4 L of oxygen. She been using a albuterol nebulizer treatment at home. Patient currently not on prednisone. Patient currently not receiving any current therapy for the pancreatic cancer. Patient denies any fevers. Cough is now nonproductive.      Past Medical History:  Diagnosis Date  . Adenomatous colon polyp 2010  . Barrett's esophagus    last EGD/Bx 12/11  . Cancer of pancreas, other site AUG 2016   CA 19-9 958.6  . GASTRITIS 09/23/2008   Qualifier: Diagnosis of  By: Nicole Kindred LPN, Dor[place]    . GERD (gastroesophageal reflux disease)   . Gout   . Helicobacter pylori gastritis 2000   s/p treatment  . Hypertension   . Pancreatic cancer ([place])   . Pancreatic cancer ([place])   . Paroxysmal SVT (supraventricular tachycardia) ([place]) 08/30/2015    Patient Active Problem List   Diagnosis Date Noted  . Paroxysmal SVT (supraventricular tachycardia) ([place]) 08/30/2015  . Acute GI bleeding 08/29/2015  . Symptomatic anemia   . Palliative care encounter   . Duodenal ulcer with hemorrhage and obstruction   . Protein-calorie malnutrition, severe 06/03/2015  . GI bleeding 06/02/2015  . Acute blood loss anemia 06/02/2015  . AKI (acute kidney injury) ([place]) 06/02/2015  . Syncope 06/02/2015  . Faintness   . Tachycardia   . Hematochezia   . Duodenal stricture   . Obstructive jaundice 01/02/2015  . Pruritus 09/14/2014  . Transaminitis 09/14/2014  . Adenocarcinoma of pancreas ([place]) 09/05/2014  . Pancreatitis 09/05/2014  . Chest pain 01/24/2014  . Dysphagia 11/25/2012  .  Adenomatous polyp of colon 08/06/2012  . BARRETTS ESOPHAGUS 02/26/2010  . ALCOHOL USE 09/29/2008  . GOUT, UNSPECIFIED 09/23/2008  . Essential hypertension 09/23/2008  . SINUSITIS, CHRONIC 09/23/2008  . GERD 09/23/2008  . Nonulcer dyspepsia 09/23/2008  . ARTHRITIS 09/23/2008    Past Surgical History:  Procedure Laterality Date  . BILIARY STENT PLACEMENT N/A 09/14/2014   Procedure: BILIARY STENT PLACEMENT;  Surgeon: Rogene Houston, MD;  Location: [facility]S;  Service: Endoscopy;  Laterality: N/A;  . BLADDER SURGERY  [id number]  . COLONOSCOPY  2010   simple adenomas  . COLONOSCOPY N/A 07/28/2013   Dr. Oneida Alar: tubular adenoma, mild sigmoid colon diverticulosis, internal hemorhhoids   . DOPPLER ECHOCARDIOGRAPHY  2009  . ERCP N/A 09/14/2014   Dr. Laural Golden: sphincterotomy with 70 F plastic biliary stent placement   . ERCP N/A 01/04/2015   Procedure: ATTEMPTED ENDOSCOPIC RETROGRADE CHOLANGIOPANCREATOGRAPHY (ERCP);  Surgeon: Daneil Dolin, MD;  Location: [facility];  Service: Endoscopy;  Laterality: N/A;  . ERCP  02/2015   [facility]: biliary stent exchange from plastic to metal stent  . ESOPHAGOGASTRODUODENOSCOPY  2008   DUODENITIS & GASTRITIS 2o TO NSAIDS/ETOH  . ESOPHAGOGASTRODUODENOSCOPY  12/2009   short segment Barrett's, small hh, chronic gastritis  . ESOPHAGOGASTRODUODENOSCOPY  01/04/2011   [id number] gastritis/Barrett's, possible  . ESOPHAGOGASTRODUODENOSCOPY N/A 11/30/2012   Dr. Oneida Alar- normal esophagus, empiric dilation d/t c/o dysphagia/?cervical web, stomach= mild non erosive gastritis, inflammation on bx, duodenum= no abnormalities in the bulb and second  portion of the duodenum. dilation at the gastroesphageal junction.  . ESOPHAGOGASTRODUODENOSCOPY N/A 06/02/2015   [facility]: 1. normal esophagus 2. gastrtitis mild 3. GI bleed due to large duodenal ulcer with large visible vessel, high risk to rebleed without prevention   . ESOPHAGOGASTRODUODENOSCOPY N/A 08/30/2015   Procedure: ESOPHAGOGASTRODUODENOSCOPY  (EGD);  Surgeon: Gatha Mayer, MD;  Location[facility] ENDOSCOPY;  Service: Endoscopy;  Laterality: N/A;  . ESOPHAGOGASTRODUODENOSCOPY (EGD) WITH PROPOFOL N/A 01/04/2015   RMR: partial uoddenal obstruction at junction of 1st and 2nd portion of duodenum, unale to intubate 2nd portion of duodenum to perform ERCP  . HEMORRHOID SURGERY    . IR GENERIC HISTORICAL  08/30/2015   IR US GUIDE VASC ACCESS RIGHT 08/30/2015 Corrie Mckusick, DO [facility]  . IR GENERIC HISTORICAL  08/30/2015   IR ANGIOGRAM SELECTIVE EACH ADDITIONAL VESSEL 08/30/2015 Corrie Mckusick, DO [facility]  . IR GENERIC HISTORICAL  08/30/2015   IR ANGIOGRAM VISCERAL SELECTIVE 08/30/2015 Corrie Mckusick, DO [facility]  . IR GENERIC HISTORICAL  08/30/2015   IR ANGIOGRAM FOLLOW UP STUDY 08/30/2015 Corrie Mckusick, DO [facility]  . IR GENERIC HISTORICAL  08/30/2015   IR FLUORO GUIDE CV LINE RIGHT 08/30/2015 Corrie Mckusick, DO [facility]  . IR GENERIC HISTORICAL  08/30/2015   IR EMBO ART  VEN HEMORR LYMPH EXTRAV  INC GUIDE ROADMAPPING 08/30/2015 Corrie Mckusick, DO [facility]  . IR GENERIC HISTORICAL  08/30/2015   IR ANGIOGRAM SELECTIVE EACH ADDITIONAL VESSEL 08/30/2015 Corrie Mckusick, DO [facility]  . IR GENERIC HISTORICAL  08/30/2015   IR ANGIOGRAM VISCERAL SELECTIVE 08/30/2015 Corrie Mckusick, DO [facility]  . IR GENERIC HISTORICAL  08/30/2015   IR US GUIDE VASC ACCESS RIGHT 08/30/2015 Corrie Mckusick, DO [facility]  . MALONEY DILATION N/A 11/30/2012   Procedure: MALONEY DILATION;  Surgeon: Danie Binder, MD;  Location: [facility] ENDO SUITE;  Service: Endoscopy;  Laterality: N/A;  . NASAL SEPTUM SURGERY    . NECK SURGERY     DISC REPLACED  . NM MYOVIEW LTD  2009  . REPLACEMENT TOTAL KNEE Right 11/2010  . SAVORY DILATION N/A 11/30/2012   Procedure: SAVORY DILATION;  Surgeon: Danie Binder, MD;  Location: [facility] SUITE;  Service: Endoscopy;  Laterality: N/A;  . SINUS EXPLORATION  2000  . SPHINCTEROTOMY N/A 09/14/2014   Procedure: SPHINCTEROTOMY;  Surgeon: Rogene Houston, MD;  Location: [facility]S;  Service: Endoscopy;  Laterality: N/A;  . stents in bile ducts    . TONSILLECTOMY     AS A CHILD       Home Medications    Prior to Admission medications   Medication Sig Start Date End Date Taking? Authorizing Provider  ciprofloxacin (CIPRO) 500 MG tablet Take 1 tablet (500 mg total) by mouth 2 (two) times daily. One po bid x 7 days Patient taking differently: Take 500 mg by mouth 2 (two) times daily.  12/18/15  Yes [name], MD  furosemide (LASIX) 20 MG tablet Take 20 mg by mouth daily. 01/05/16  Yes Historical Provider, MD  omeprazole (PRILOSEC) 20 MG capsule Take 20 mg by mouth daily.   Yes Historical Provider, MD  Oxycodone HCl 10 MG TABS Take 1 tablet (10 mg total) by mouth every 4 (four) hours as needed. Patient taking differently: Take 10 mg by mouth every 4 (four) hours as needed (FOR PAIN).  08/24/15  Yes Patrici Ranks, MD  sotalol (BETAPACE) 80 MG tablet Take 80 mg by mouth daily.   Yes Historical  Provider, MD  predniSONE (DELTASONE) 10 MG tablet Take 4 tablets (40 mg total) by mouth daily. 01/08/16   Fredia Sorrow, MD    Family History Family History  Problem Relation Age of Onset  . Stomach cancer Father 58    deceased  . Colon cancer Neg Hx     Social History Social History  Substance Use Topics  . Smoking status: Never Smoker  . Smokeless tobacco: Never Used     Comment: Never smoked much  . Alcohol use No     Comment: former drinks about a pint of borb[place] per week     Allergies   Doxycycline; Colestipol; and Doxycycline   Review of Systems Review of Systems  Constitutional: Negative for fever.  HENT: Positive for congestion.   Eyes: Negative for redness.  Respiratory: Positive for cough. Negative for shortness of breath.   Cardiovascular: Negative for chest pain.  Gastrointestinal: Negative for abdominal pain.  Genitourinary: Negative for dysuria.  Musculoskeletal: Negative for back pain.  Skin: Negative  for rash.  Neurological: Negative for headaches.  Hematological: Does not bruise/bleed easily.  Psychiatric/Behavioral: Negative for confusion.     Physical Exam Updated Vital Signs BP 124/91   Pulse 83   Temp 98.4 F (36.9 C) (Oral)   Resp 20   Ht 5\' 9"  (1.753 m)   Wt 68 kg   SpO2 94%   BMI 22.15 kg/m   Physical Exam  Constitutional: He is oriented to person, place, and time. He appears well-developed and well-nourished. No distress.  HENT:  Head: Normocephalic and atraumatic.  Mouth/Throat: Oropharynx is clear and moist.  Eyes: Conjunctivae and EOM are normal. Pupils are equal, round, and reactive to light.  Neck: Normal range of motion. Neck supple.  Cardiovascular: Normal rate, regular rhythm and normal heart sounds.   Pulmonary/Chest: Effort normal and breath sounds normal. He has no wheezes. He has no rales.  Abdominal: Soft. Bowel sounds are normal.  Musculoskeletal: Normal range of motion. He exhibits edema.  A patient with left arm swelling. No erythema.  Neurological: He is alert and oriented to person, place, and time.  Skin: Skin is warm.  Nursing note and vitals reviewed.    ED Treatments / Results  Labs (all labs ordered are listed, but only abnormal results are displayed) Labs Reviewed - No data to display  EKG  EKG Interpretation None       Radiology Dg Chest 2 View  Result Date: 01/08/2016 CLINICAL DATA:  Shortness of breath, chest pain, pancreatic cancer. EXAM: CHEST  2 VIEW COMPARISON:  CT chest and chest radiograph 12/18/2015. FINDINGS: Trachea is midline. Left sided Port-A-Cath terminates at the SVC RA junction. Heart size normal. There is a micronodular pattern throughout the lungs, corresponding to metastatic disease on 12/18/2015. Somewhat chronic appearing airspace consolidation is seen in the posterior inferior right lower lobe. No definite pleural fluid. Right hemidiaphragm is elevated. IMPRESSION: Pulmonary metastatic disease with  somewhat chronic appearing consolidation in the posterior inferior right lower lobe. Electronically Signed   By: Lorin Picket M.D.   On: 01/08/2016 16:42   Ct Chest Wo Contrast  Result Date: 01/08/2016 CLINICAL DATA:  History of pancreatic carcinoma with pulmonary metastases. EXAM: CT CHEST WITHOUT CONTRAST TECHNIQUE: Multidetector CT imaging of the chest was performed following the standard protocol without IV contrast. COMPARISON:  CT chest 12/18/2015. PA and lateral chest 01/08/2016 and 12/18/2015. FINDINGS: Cardiovascular: Heart size is normal. No pericardial effusion. Calcific coronary and aortic atherosclerosis is identified. Mediastinum/Nodes: No  enlarged mediastinal or axillary lymph nodes. Thyroid gland, trachea, and esophagus demonstrate no significant findings. Lungs/Pleura: The patient has a small right pleural effusion. Trace left pleural effusion is decreased since the prior study. As seen on the prior examination, there are innumerable pulmonary nodules bilaterally. The appearance is not grossly changed. Atelectasis in the right lower lobe is identified. High-density material within the lung parenchyma in this location likely secondary to aspiration is noted. Upper Abdomen: Ascites is seen in the upper abdomen as on the prior study. Pneumobilia is unchanged. Low attenuating lesion in the inferior right hepatic lobe measuring 0.9 cm on image 147 is likely a metastatic deposit. Evaluation solid parenchyma is limited by scratch Vascular coronal in the upper abdomen are noted. Musculoskeletal: No lytic or sclerotic lesion is identified. IMPRESSION: No acute abnormality in patient with innumerable pulmonary nodules consistent with metastatic disease. Small right pleural effusion is not notably changed since the previous examination. No notable change in partially visualized abdominal ascites. Electronically Signed   By: Inge Rise M.D.   On: 01/08/2016 18:18    Procedures Procedures  (including critical care time)  Medications Ordered in ED Medications  oxyCODONE (Oxy IR/ROXICODONE) immediate release tablet 10 mg (10 mg Oral Given 01/08/16 1720)  predniSONE (DELTASONE) tablet 60 mg (60 mg Oral Given 01/08/16 1845)     Initial Impression / Assessment and Plan / ED Course  I have reviewed the triage vital signs and the nursing notes.  Pertinent labs & imaging results that were available during my care of the patient were reviewed by me and considered in my medical decision making (see chart for details).  Clinical Course    Patient presents with a complaint of cough. Was productive but that had resolved. Patient treated with a course of Keflex antibiotics ended on Friday. Patient has known metastatic pancreatic cancer. Today's workup to include chest x-ray and CT chest without any acute findings. Patient's not hypoxic. Patient normally on 4 L of oxygen on that he satting very well. Patient's been using nebulizer treatment at home. No wheezing here. Patient stable currently nontoxic. Will give a course of prednisone to help with the cough. Patient encouraged to reschedule his appointment with Dr. Whitney Muse and heme hematology oncology.  Final Clinical Impressions(s) / ED Diagnoses   Final diagnoses:  Bronchitis  H/O pancreatic cancer    New Prescriptions New Prescriptions   PREDNISONE (DELTASONE) 10 MG TABLET    Take 4 tablets (40 mg total) by mouth daily.     Fredia Sorrow, MD 01/08/16 [phone]

## 2016-01-08 NOTE — Discharge Instructions (Signed)
Take prednisone as directed. Make an appointment to follow-up with hematology oncology clinic here. Chest x-ray and CT of the chest without any significant changes. Return for any new or worse symptoms.

## 2016-01-09 ENCOUNTER — Telehealth (HOSPITAL_COMMUNITY): Payer: Self-pay | Admitting: *Deleted

## 2016-01-11 NOTE — Telephone Encounter (Signed)
Unable to reach the pt at either number in chart.

## 2016-01-16 DIAGNOSIS — R0609 Other forms of dyspnea: Secondary | ICD-10-CM | POA: Diagnosis not present

## 2016-01-16 DIAGNOSIS — I480 Paroxysmal atrial fibrillation: Secondary | ICD-10-CM | POA: Diagnosis not present

## 2016-01-16 DIAGNOSIS — C259 Malignant neoplasm of pancreas, unspecified: Secondary | ICD-10-CM | POA: Diagnosis not present

## 2016-02-16 DIAGNOSIS — C259 Malignant neoplasm of pancreas, unspecified: Secondary | ICD-10-CM | POA: Diagnosis not present

## 2016-02-16 DIAGNOSIS — I10 Essential (primary) hypertension: Secondary | ICD-10-CM | POA: Diagnosis not present

## 2016-02-16 DIAGNOSIS — R52 Pain, unspecified: Secondary | ICD-10-CM | POA: Diagnosis not present

## 2016-02-16 DIAGNOSIS — K22719 Barrett's esophagus with dysplasia, unspecified: Secondary | ICD-10-CM | POA: Diagnosis not present

## 2016-02-16 DIAGNOSIS — K219 Gastro-esophageal reflux disease without esophagitis: Secondary | ICD-10-CM | POA: Diagnosis not present

## 2016-02-16 DIAGNOSIS — I4891 Unspecified atrial fibrillation: Secondary | ICD-10-CM | POA: Diagnosis not present

## 2016-02-16 DIAGNOSIS — M6281 Muscle weakness (generalized): Secondary | ICD-10-CM | POA: Diagnosis not present

## 2016-02-16 DIAGNOSIS — M1000 Idiopathic gout, unspecified site: Secondary | ICD-10-CM | POA: Diagnosis not present

## 2016-02-19 DIAGNOSIS — C259 Malignant neoplasm of pancreas, unspecified: Secondary | ICD-10-CM | POA: Diagnosis not present

## 2016-02-19 DIAGNOSIS — M1000 Idiopathic gout, unspecified site: Secondary | ICD-10-CM | POA: Diagnosis not present

## 2016-02-19 DIAGNOSIS — K22719 Barrett's esophagus with dysplasia, unspecified: Secondary | ICD-10-CM | POA: Diagnosis not present

## 2016-02-19 DIAGNOSIS — K219 Gastro-esophageal reflux disease without esophagitis: Secondary | ICD-10-CM | POA: Diagnosis not present

## 2016-02-19 DIAGNOSIS — R52 Pain, unspecified: Secondary | ICD-10-CM | POA: Diagnosis not present

## 2016-02-19 DIAGNOSIS — I10 Essential (primary) hypertension: Secondary | ICD-10-CM | POA: Diagnosis not present

## 2016-02-22 DIAGNOSIS — C259 Malignant neoplasm of pancreas, unspecified: Secondary | ICD-10-CM | POA: Diagnosis not present

## 2016-02-22 DIAGNOSIS — M6281 Muscle weakness (generalized): Secondary | ICD-10-CM | POA: Diagnosis not present

## 2016-02-22 DIAGNOSIS — R52 Pain, unspecified: Secondary | ICD-10-CM | POA: Diagnosis not present

## 2016-02-22 DIAGNOSIS — I10 Essential (primary) hypertension: Secondary | ICD-10-CM | POA: Diagnosis not present

## 2016-02-22 DIAGNOSIS — M1000 Idiopathic gout, unspecified site: Secondary | ICD-10-CM | POA: Diagnosis not present

## 2016-02-22 DIAGNOSIS — I4891 Unspecified atrial fibrillation: Secondary | ICD-10-CM | POA: Diagnosis not present

## 2016-02-22 DIAGNOSIS — K22719 Barrett's esophagus with dysplasia, unspecified: Secondary | ICD-10-CM | POA: Diagnosis not present

## 2016-02-22 DIAGNOSIS — K219 Gastro-esophageal reflux disease without esophagitis: Secondary | ICD-10-CM | POA: Diagnosis not present

## 2016-02-23 DIAGNOSIS — M1000 Idiopathic gout, unspecified site: Secondary | ICD-10-CM | POA: Diagnosis not present

## 2016-02-23 DIAGNOSIS — K22719 Barrett's esophagus with dysplasia, unspecified: Secondary | ICD-10-CM | POA: Diagnosis not present

## 2016-02-23 DIAGNOSIS — K219 Gastro-esophageal reflux disease without esophagitis: Secondary | ICD-10-CM | POA: Diagnosis not present

## 2016-02-23 DIAGNOSIS — C259 Malignant neoplasm of pancreas, unspecified: Secondary | ICD-10-CM | POA: Diagnosis not present

## 2016-02-23 DIAGNOSIS — R52 Pain, unspecified: Secondary | ICD-10-CM | POA: Diagnosis not present

## 2016-02-23 DIAGNOSIS — I10 Essential (primary) hypertension: Secondary | ICD-10-CM | POA: Diagnosis not present

## 2016-02-24 DIAGNOSIS — I10 Essential (primary) hypertension: Secondary | ICD-10-CM | POA: Diagnosis not present

## 2016-02-24 DIAGNOSIS — K219 Gastro-esophageal reflux disease without esophagitis: Secondary | ICD-10-CM | POA: Diagnosis not present

## 2016-02-24 DIAGNOSIS — C259 Malignant neoplasm of pancreas, unspecified: Secondary | ICD-10-CM | POA: Diagnosis not present

## 2016-02-24 DIAGNOSIS — M1000 Idiopathic gout, unspecified site: Secondary | ICD-10-CM | POA: Diagnosis not present

## 2016-02-24 DIAGNOSIS — R52 Pain, unspecified: Secondary | ICD-10-CM | POA: Diagnosis not present

## 2016-02-24 DIAGNOSIS — K22719 Barrett's esophagus with dysplasia, unspecified: Secondary | ICD-10-CM | POA: Diagnosis not present

## 2016-02-25 DIAGNOSIS — K22719 Barrett's esophagus with dysplasia, unspecified: Secondary | ICD-10-CM | POA: Diagnosis not present

## 2016-02-25 DIAGNOSIS — C259 Malignant neoplasm of pancreas, unspecified: Secondary | ICD-10-CM | POA: Diagnosis not present

## 2016-02-25 DIAGNOSIS — I10 Essential (primary) hypertension: Secondary | ICD-10-CM | POA: Diagnosis not present

## 2016-02-25 DIAGNOSIS — M1000 Idiopathic gout, unspecified site: Secondary | ICD-10-CM | POA: Diagnosis not present

## 2016-02-25 DIAGNOSIS — K219 Gastro-esophageal reflux disease without esophagitis: Secondary | ICD-10-CM | POA: Diagnosis not present

## 2016-02-25 DIAGNOSIS — R52 Pain, unspecified: Secondary | ICD-10-CM | POA: Diagnosis not present

## 2016-03-21 DEATH — deceased
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
